# Patient Record
Sex: Female | Born: 1940 | Race: White | Hispanic: No | Marital: Married | Smoking: Never smoker
Health system: Southern US, Community
[De-identification: ages and names within clinical notes are randomized; demographics above are authoritative.]

## PROBLEM LIST (undated history)

## (undated) DIAGNOSIS — I639 Cerebral infarction, unspecified: Secondary | ICD-10-CM

## (undated) DIAGNOSIS — F32A Depression, unspecified: Secondary | ICD-10-CM

## (undated) DIAGNOSIS — F329 Major depressive disorder, single episode, unspecified: Secondary | ICD-10-CM

## (undated) DIAGNOSIS — E785 Hyperlipidemia, unspecified: Secondary | ICD-10-CM

## (undated) DIAGNOSIS — I10 Essential (primary) hypertension: Secondary | ICD-10-CM

## (undated) DIAGNOSIS — N189 Chronic kidney disease, unspecified: Secondary | ICD-10-CM

## (undated) DIAGNOSIS — C579 Malignant neoplasm of female genital organ, unspecified: Secondary | ICD-10-CM

## (undated) HISTORY — PX: ABDOMINAL HYSTERECTOMY: SHX81

## (undated) HISTORY — PX: EYE SURGERY: SHX253

## (undated) HISTORY — DX: Depression, unspecified: F32.A

## (undated) HISTORY — PX: KNEE SURGERY: SHX244

## (undated) HISTORY — PX: OTHER SURGICAL HISTORY: SHX169

## (undated) HISTORY — DX: Major depressive disorder, single episode, unspecified: F32.9

## (undated) HISTORY — PX: HEMORROIDECTOMY: SUR656

---

## 1999-08-12 ENCOUNTER — Other Ambulatory Visit: Admission: RE | Admit: 1999-08-12 | Discharge: 1999-08-12 | Payer: Self-pay | Admitting: Orthopedic Surgery

## 1999-10-10 ENCOUNTER — Encounter: Admission: RE | Admit: 1999-10-10 | Discharge: 1999-10-10 | Payer: Self-pay | Admitting: Family Medicine

## 1999-12-04 ENCOUNTER — Encounter (INDEPENDENT_AMBULATORY_CARE_PROVIDER_SITE_OTHER): Payer: Self-pay | Admitting: *Deleted

## 1999-12-04 ENCOUNTER — Ambulatory Visit (HOSPITAL_COMMUNITY): Admission: RE | Admit: 1999-12-04 | Discharge: 1999-12-04 | Payer: Self-pay | Admitting: Gastroenterology

## 1999-12-04 ENCOUNTER — Encounter (INDEPENDENT_AMBULATORY_CARE_PROVIDER_SITE_OTHER): Payer: Self-pay | Admitting: Specialist

## 2000-01-20 ENCOUNTER — Ambulatory Visit (HOSPITAL_COMMUNITY): Admission: RE | Admit: 2000-01-20 | Discharge: 2000-01-20 | Payer: Self-pay | Admitting: Gastroenterology

## 2000-03-23 ENCOUNTER — Encounter (INDEPENDENT_AMBULATORY_CARE_PROVIDER_SITE_OTHER): Payer: Self-pay | Admitting: *Deleted

## 2000-03-23 ENCOUNTER — Encounter: Payer: Self-pay | Admitting: Gastroenterology

## 2000-03-23 ENCOUNTER — Encounter: Admission: RE | Admit: 2000-03-23 | Discharge: 2000-03-23 | Payer: Self-pay | Admitting: Gastroenterology

## 2000-05-18 ENCOUNTER — Encounter: Payer: Self-pay | Admitting: Gastroenterology

## 2000-05-18 ENCOUNTER — Encounter (INDEPENDENT_AMBULATORY_CARE_PROVIDER_SITE_OTHER): Payer: Self-pay | Admitting: *Deleted

## 2000-05-18 ENCOUNTER — Encounter: Admission: RE | Admit: 2000-05-18 | Discharge: 2000-05-18 | Payer: Self-pay | Admitting: Gastroenterology

## 2000-06-19 ENCOUNTER — Encounter: Payer: Self-pay | Admitting: Emergency Medicine

## 2000-06-19 ENCOUNTER — Emergency Department (HOSPITAL_COMMUNITY): Admission: EM | Admit: 2000-06-19 | Discharge: 2000-06-19 | Payer: Self-pay | Admitting: Emergency Medicine

## 2002-12-13 ENCOUNTER — Ambulatory Visit (HOSPITAL_COMMUNITY)
Admission: RE | Admit: 2002-12-13 | Discharge: 2002-12-13 | Payer: Self-pay | Admitting: Physical Medicine and Rehabilitation

## 2003-04-25 ENCOUNTER — Encounter: Admission: RE | Admit: 2003-04-25 | Discharge: 2003-04-25 | Payer: Self-pay | Admitting: Family Medicine

## 2003-04-25 ENCOUNTER — Encounter: Payer: Self-pay | Admitting: Family Medicine

## 2003-05-09 ENCOUNTER — Encounter: Payer: Self-pay | Admitting: Family Medicine

## 2003-05-09 ENCOUNTER — Encounter: Admission: RE | Admit: 2003-05-09 | Discharge: 2003-05-09 | Payer: Self-pay | Admitting: Family Medicine

## 2005-04-20 ENCOUNTER — Encounter (INDEPENDENT_AMBULATORY_CARE_PROVIDER_SITE_OTHER): Payer: Self-pay | Admitting: *Deleted

## 2005-04-20 ENCOUNTER — Ambulatory Visit (HOSPITAL_COMMUNITY): Admission: RE | Admit: 2005-04-20 | Discharge: 2005-04-20 | Payer: Self-pay | Admitting: Gastroenterology

## 2006-07-09 ENCOUNTER — Encounter: Admission: RE | Admit: 2006-07-09 | Discharge: 2006-07-09 | Payer: Self-pay | Admitting: Rheumatology

## 2006-07-30 ENCOUNTER — Encounter: Admission: RE | Admit: 2006-07-30 | Discharge: 2006-07-30 | Payer: Self-pay | Admitting: Rheumatology

## 2006-08-25 ENCOUNTER — Encounter: Admission: RE | Admit: 2006-08-25 | Discharge: 2006-08-25 | Payer: Self-pay | Admitting: Rheumatology

## 2006-12-07 ENCOUNTER — Encounter: Admission: RE | Admit: 2006-12-07 | Discharge: 2006-12-07 | Payer: Self-pay | Admitting: Family Medicine

## 2007-04-29 ENCOUNTER — Encounter (INDEPENDENT_AMBULATORY_CARE_PROVIDER_SITE_OTHER): Payer: Self-pay | Admitting: *Deleted

## 2007-04-29 ENCOUNTER — Ambulatory Visit (HOSPITAL_COMMUNITY): Admission: RE | Admit: 2007-04-29 | Discharge: 2007-04-29 | Payer: Self-pay | Admitting: Family Medicine

## 2007-05-12 ENCOUNTER — Encounter: Admission: RE | Admit: 2007-05-12 | Discharge: 2007-05-12 | Payer: Self-pay | Admitting: Family Medicine

## 2007-05-16 ENCOUNTER — Encounter: Admission: RE | Admit: 2007-05-16 | Discharge: 2007-05-16 | Payer: Self-pay | Admitting: Family Medicine

## 2007-08-16 ENCOUNTER — Encounter (INDEPENDENT_AMBULATORY_CARE_PROVIDER_SITE_OTHER): Payer: Self-pay | Admitting: *Deleted

## 2007-12-20 ENCOUNTER — Encounter: Admission: RE | Admit: 2007-12-20 | Discharge: 2007-12-20 | Payer: Self-pay | Admitting: Family Medicine

## 2007-12-23 IMAGING — US US RENAL
1 series · 14 of 25 positions shown · non-contrast
Comparison: 04/29/07

CLINICAL DATA: Abnormal lesion of left kidney noted on a routine abdominal study, 04/29/07.  

 RENAL ULTRASOUND:
TECHNIQUE: Complete ultrasound examination of the urinary tract was performed including evaluation of the kidneys, renal collecting systems, and urinary bladder.

[Series 1: us renal · 0.28mm/px · 14 of 39 slices shown]
[im 1/39]
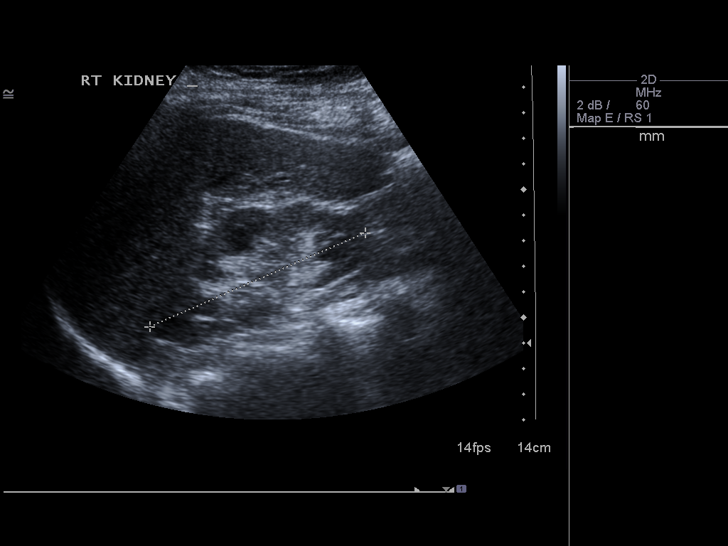
[im 4/39]
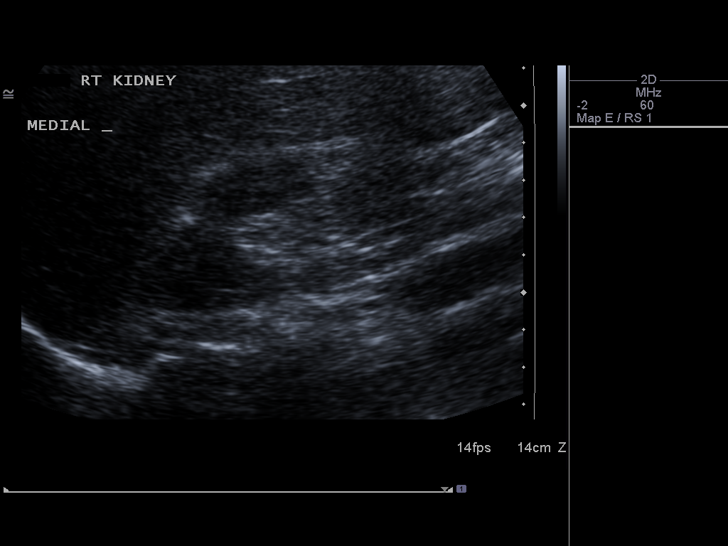
[im 7/39]
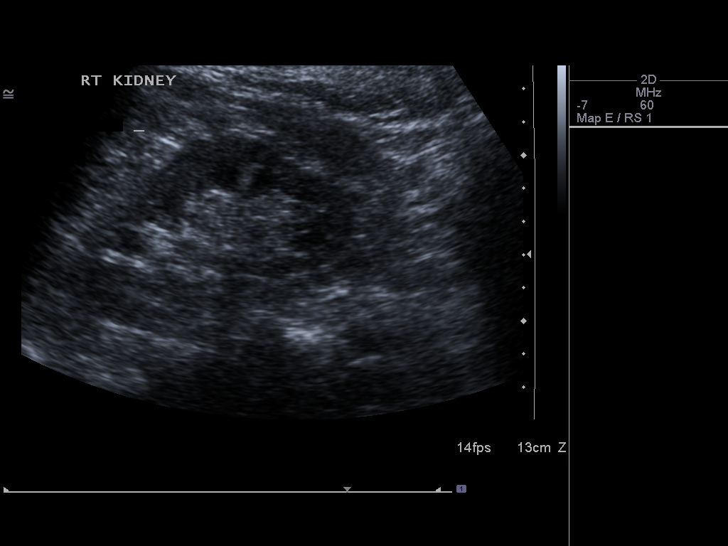
[im 10/39]
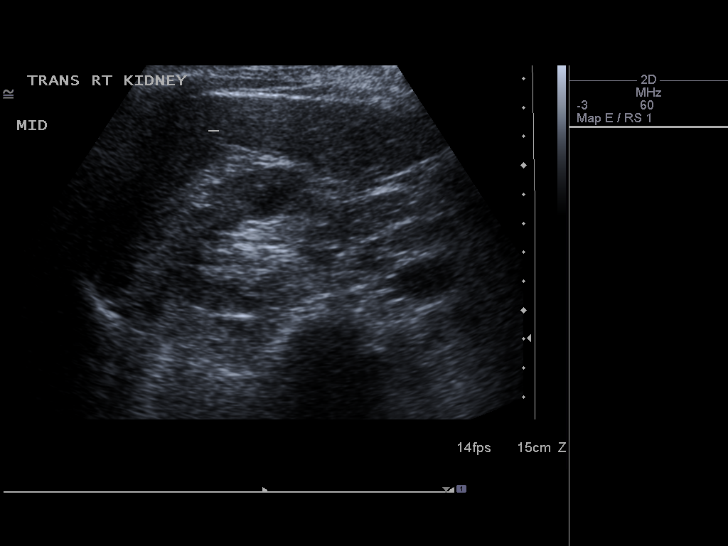
[im 13/39]
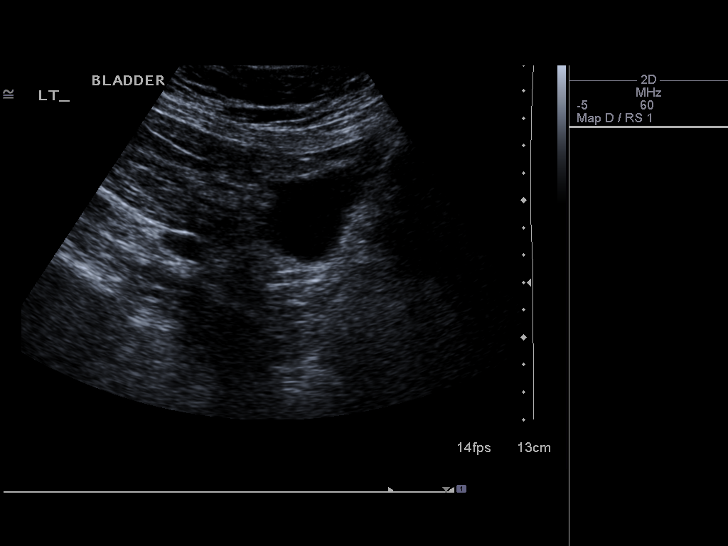
[im 15/39]
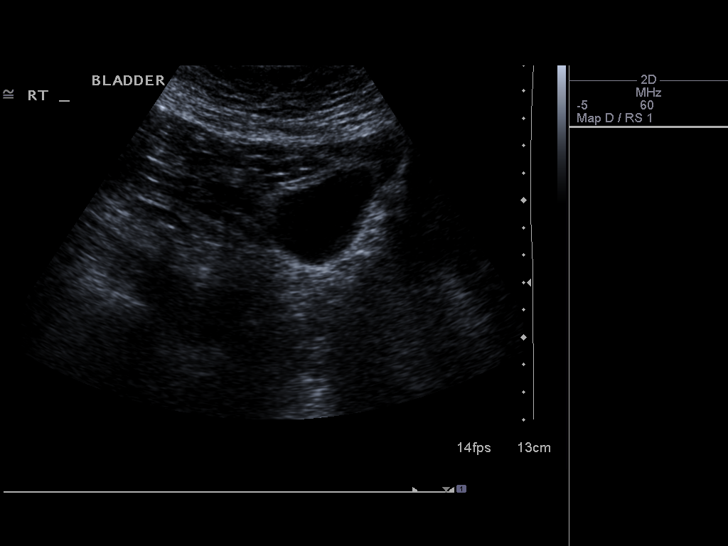
[im 18/39]
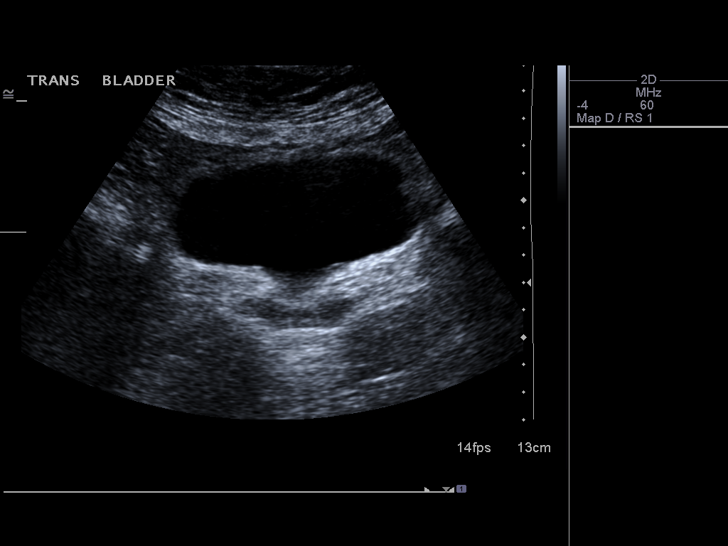
[im 21/39]
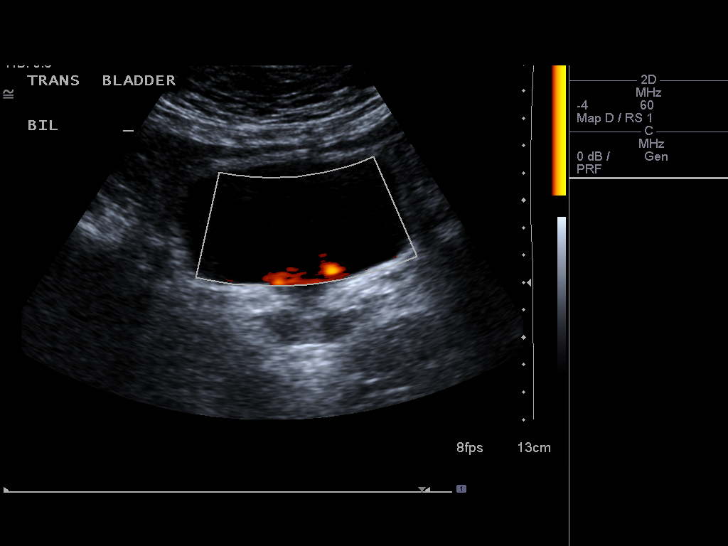
[im 24/39]
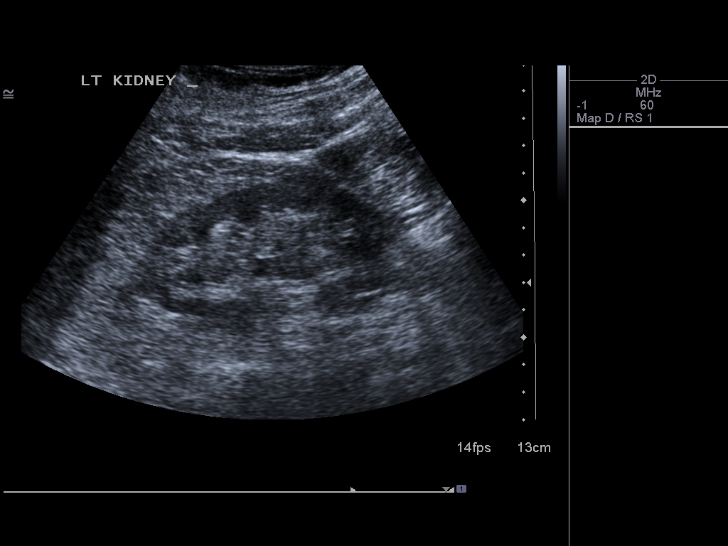
[im 26/39]
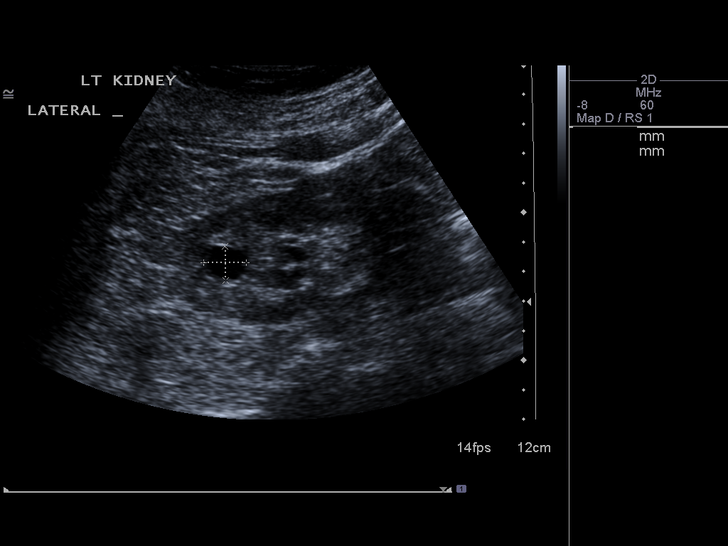
[im 29/39]
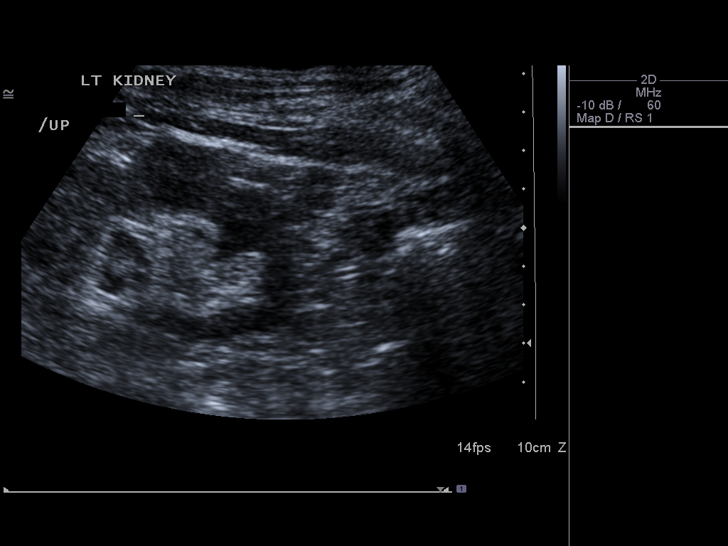
[im 32/39]
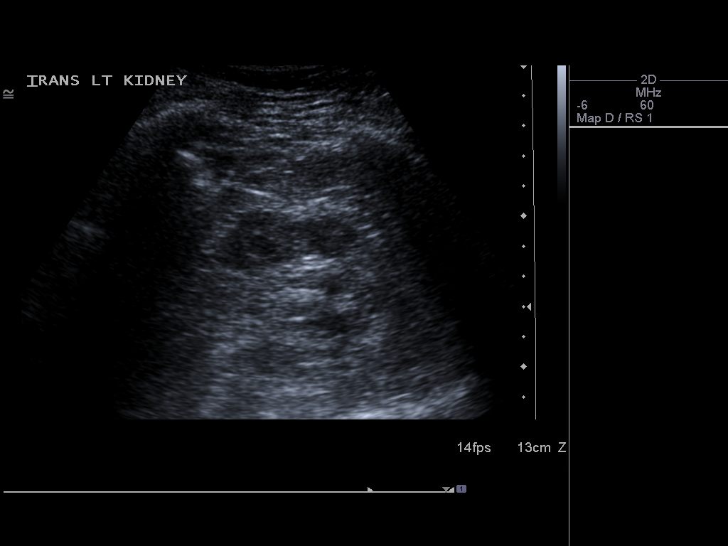
[im 35/39]
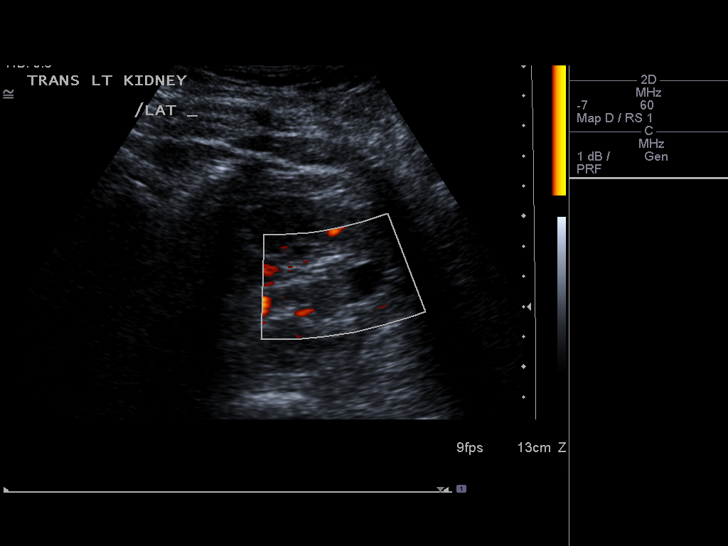
[im 39/39]
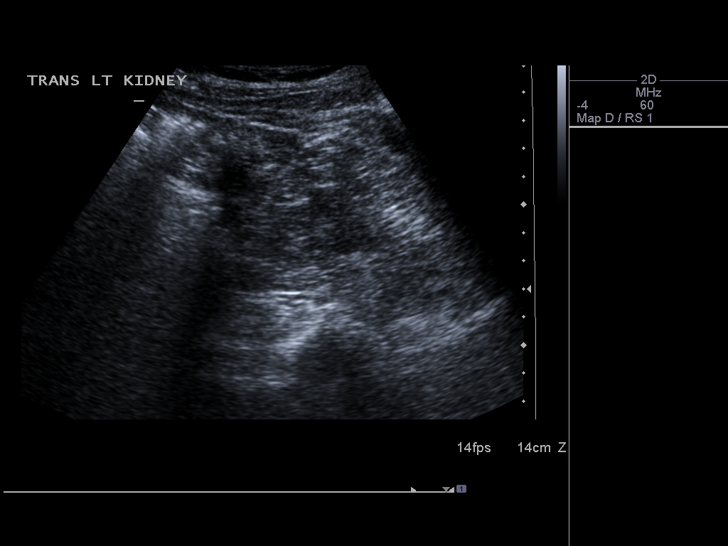

[14 of 25 positions shown; findings below may reference images not displayed]

FINDINGS: The right kidney is 9.2 and the left 9.6 cm in length.  The cystic lesion in the upper pole of the left kidney is again noted.  It measures 1.4 x 1.2 x 1.5 mm.  It looks like a simple cyst in that there are no internal echoes.  I cannot say, however, that there is definite through transmission.  I think it is highly likely that this is a simple cyst.  I believe it would be prudent to do a follow-up exam in six months, to make sure it is not enlarging.  No other renal lesions.  No hydronephrosis or obvious stones.  The bladder is normal.
IMPRESSION: The lesion in the upper pole of the left kidney is most likely a simple cyst as discussed above.  I would recommend a follow-up ultrasound in six months.  See report.

## 2008-12-11 ENCOUNTER — Emergency Department (HOSPITAL_COMMUNITY): Admission: EM | Admit: 2008-12-11 | Discharge: 2008-12-11 | Payer: Self-pay | Admitting: Emergency Medicine

## 2008-12-20 ENCOUNTER — Encounter: Admission: RE | Admit: 2008-12-20 | Discharge: 2008-12-20 | Payer: Self-pay | Admitting: Family Medicine

## 2009-03-06 ENCOUNTER — Encounter: Payer: Self-pay | Admitting: Cardiology

## 2009-03-06 ENCOUNTER — Ambulatory Visit: Payer: Self-pay | Admitting: Cardiology

## 2009-03-06 DIAGNOSIS — R002 Palpitations: Secondary | ICD-10-CM | POA: Insufficient documentation

## 2009-03-06 DIAGNOSIS — R079 Chest pain, unspecified: Secondary | ICD-10-CM | POA: Insufficient documentation

## 2009-03-06 DIAGNOSIS — R42 Dizziness and giddiness: Secondary | ICD-10-CM

## 2009-03-06 DIAGNOSIS — E785 Hyperlipidemia, unspecified: Secondary | ICD-10-CM

## 2009-03-14 ENCOUNTER — Ambulatory Visit: Payer: Self-pay

## 2009-03-14 ENCOUNTER — Encounter: Payer: Self-pay | Admitting: Cardiology

## 2009-03-15 ENCOUNTER — Ambulatory Visit: Payer: Self-pay | Admitting: Cardiology

## 2010-08-11 ENCOUNTER — Encounter (INDEPENDENT_AMBULATORY_CARE_PROVIDER_SITE_OTHER): Payer: Self-pay | Admitting: *Deleted

## 2010-09-03 ENCOUNTER — Encounter (INDEPENDENT_AMBULATORY_CARE_PROVIDER_SITE_OTHER): Payer: Self-pay | Admitting: *Deleted

## 2010-09-04 ENCOUNTER — Ambulatory Visit: Payer: Self-pay | Admitting: Internal Medicine

## 2010-09-22 ENCOUNTER — Encounter: Admission: RE | Admit: 2010-09-22 | Discharge: 2010-09-22 | Payer: Self-pay | Admitting: Diagnostic Neuroimaging

## 2010-09-25 ENCOUNTER — Ambulatory Visit: Payer: Self-pay | Admitting: Internal Medicine

## 2010-09-29 ENCOUNTER — Encounter: Payer: Self-pay | Admitting: Internal Medicine

## 2011-01-27 NOTE — Letter (Signed)
Summary: Previsit letter  [facility]  [address] [place], [place] [address]   Phone: [phone]  Fax: [phone]       08/11/2010 MRN: [id number]  [facility] [address] [place], [place]  [address]  Dear Ms. Rohlfs,  Welcome to the Gastroenterology Division at [facility]    You are scheduled to see a nurse for your pre-procedure visit on 09/04/2010 at 1:30AM on the 3rd floor at [facility], [address]  We ask that you try to arrive at our office 15 minutes prior to your appointment time to allow for check-in.  Your nurse visit will consist of discussing your medical and surgical history, your immediate family medical history, and your medications.    Please bring a complete list of all your medications or, if you prefer, bring the medication bottles and we will list them.  We will need to be aware of both prescribed and over the counter drugs.  We will need to know exact dosage information as well.  If you are on blood thinners (Coumadin, Plavix, Aggrenox, Ticlid, etc.) please call our office today/prior to your appointment, as we need to consult with your physician about holding your medication.   Please be prepared to read and sign documents such as consent forms, a financial agreement, and acknowledgement forms.  If necessary, and with your consent, a friend or relative is welcome to sit-in on the nurse visit with you.  Please bring your insurance card so that we may make a copy of it.  If your insurance requires a referral to see a specialist, please bring your referral form from your primary care physician.  No co-pay is required for this nurse visit.     If you cannot keep your appointment, please call [phone] to cancel or reschedule prior to your appointment date.  This allows [place] the opportunity to schedule an appointment for another patient in need of care.    Thank you for choosing  Gastroenterology for your medical needs.   We appreciate the opportunity to care for you.  Please visit [place] at our website  to learn more about our practice.                     Sincerely.                                                                                                                   The Gastroenterology Division

## 2011-01-27 NOTE — Procedures (Signed)
Summary: Colonoscopy  Patient: Nicole Ruiz Note: All result statuses are Final unless otherwise noted.  Tests: (1) Colonoscopy (COL)   COL Colonoscopy           DONE     [facility]     [address]     [place], [place]  [address]           COLONOSCOPY PROCEDURE REPORT           PATIENT:  Nicole Ruiz, Nicole Ruiz  MR#:  [id number]     BIRTHDATE:  1941/11/07, 69 yrs. old  GENDER:  female     ENDOSCOPIST:  Hedwig Morton. Juanda Chance, MD     REF. BY:  Charlesetta Shanks, M.D.     PROCEDURE DATE:  09/25/2010     PROCEDURE:  Colonoscopy [address]     ASA CLASS:  Class II     INDICATIONS:  Routine Risk Screening     MEDICATIONS:   Versed 6 mg, Fentanyl 50 mcg           DESCRIPTION OF PROCEDURE:   After the risks benefits and     alternatives of the procedure were thoroughly explained, informed     consent was obtained.  Digital rectal exam was performed and     revealed no rectal masses.   The LB PCF-[id number] endoscope     was introduced through the anus and advanced to the cecum, which     was identified by both the appendix and ileocecal valve, without     limitations.  The quality of the prep was adequate, using MiraLax.     The instrument was then slowly withdrawn as the colon was fully     examined.     <<PROCEDUREIMAGES>>           FINDINGS:  A sessile polyp was found. 5mm sessile polyp at 70 cm,     distal from the ileocecal valve Polyp was snared without cautery.     Retrieval was successful (see image2 and image1). snare polyp     This was otherwise a normal examination of the colon (see image3,     image2, and image1).  Internal hemorrhoids were found (see image9     and image8).  This was otherwise a normal examination of the colon     (see image6 and image5).   Retroflexed views in the rectum     revealed no abnormalities.    The scope was then withdrawn from     the patient and the procedure completed.           COMPLICATIONS:  None     ENDOSCOPIC IMPRESSION:     1) Sessile  polyp     2) Internal hemorrhoids     3) Otherwise normal examination     RECOMMENDATIONS:     1) Await pathology results     2) High fiber diet.     REPEAT EXAM:  In 5 - 7 year(s) for.           ______________________________     Hedwig Morton. Juanda Chance, MD           CC:           n.     eSIGNED:   Hedwig Morton. Brodie at 09/25/2010 11:17 AM           Gustavus Bryant, [id number]  Note: An exclamation mark (!) indicates a result that was not dispersed into the flowsheet. Document Creation Date: 09/25/2010  11:18 AM _______________________________________________________________________  (1) Order result status: Final Collection or observation date-time: 09/25/2010 11:04 Requested date-time:  Receipt date-time:  Reported date-time:  Referring Physician:   Ordering Physician: Lina Sar [phone]) Specimen Source:  Source: Launa Grill Order Number: [id number] Lab site:   Appended Document: Colonoscopy recall colon 10 years  Appended Document: Colonoscopy     Procedures Next Due Date:    Colonoscopy: 09/2020

## 2011-01-27 NOTE — Letter (Signed)
Summary: Patient Notice- Polyp Results  [facility]  [address] [place], [place] [address]   Phone: [phone]  Fax: [phone]        September 29, 2010 MRN: [id number]    Nicole Ruiz [address] [place], [place]  [address]    Dear [name],  I am pleased to inform you that the colon polyp(s) removed during your recent colonoscopy was (were) found to be benign (no cancer detected) upon pathologic examination.The polyp consisted of inflamed tissue ( not premalignant)  I recommend you have a repeat colonoscopy examination in 10 _ years to look for recurrent polyps, as having colon polyps increases your risk for having recurrent polyps or even colon cancer in the future.  Should you develop new or worsening symptoms of abdominal pain, bowel habit changes or bleeding from the rectum or bowels, please schedule an evaluation with either your primary care physician or with me.  Additional information/recommendations:  _x_ No further action with gastroenterology is needed at this time. Please      follow-up with your primary care physician for your other healthcare      needs.  __ Please call [phone] to schedule a return visit to review your      situation.  __ Please keep your follow-up visit as already scheduled.  __ Continue treatment plan as outlined the day of your exam.  Please call us if you are having persistent problems or have questions about your condition that have not been fully answered at this time.  Sincerely,  Hart Carwin MD  This letter has been electronically signed by your physician.  Appended Document: Patient Notice- Polyp Results letter mailed

## 2011-01-27 NOTE — Op Note (Signed)
Summary: COLON (Dr Magod)/BX  NAME:  Nicole, Ruiz               ACCOUNT NO.:  [id number]   MEDICAL RECORD NO.:  [id number]          PATIENT TYPE:  AMB   LOCATION:  ENDO                         FACILITY:  [facility]   PHYSICIAN:  Petra Kuba, M.D.    DATE OF BIRTH:  Jan 24, 1941   DATE OF PROCEDURE:  04/20/2005  DATE OF DISCHARGE:                                 OPERATIVE REPORT   PROCEDURE PERFORMED:  Colonoscopy with biopsy.   ENDOSCOPIST:  Petra Kuba, M.D.   INDICATIONS FOR PROCEDURE:  Patient with a history of colon polyps due for  repeat screening.  Consent was signed after the risks, benefits, methods and  options were thoroughly discussed in the office in the past.   MEDICINES USED:  Demerol 70 mg, Versed 7.5 mg.   DESCRIPTION OF PROCEDURE:  Rectal inspection was pertinent for external  hemorrhoids, small.  Digital exam was negative.  A video pediatric  adjustable colonoscope was inserted and fairly easily advanced around the  colon to the cecum.  This did require some abdominal pressure but no  position changes.  No obvious abnormality was seen on insertion.  The cecum  was identified by the appendiceal orifice and the ileocecal valve.  In fact  the scope was inserted a short ways into the terminal ileum which was  normal.  Photodocumentation was obtained.  The scope was slowly withdrawn.  The prep was adequate.  It did require a moderate amount of washing and  suctioning for adequate visualization.  On slow withdrawal through the  colon, cecum, ascending, transverse and majority of the descending were  normal.  As the scope was withdrawn around the sigmoid, rare early left-  sided diverticula were seen.  In the midsigmoid, questionable tiny polyp was  seen and was cold biopsied times one.  Scope was further withdrawn.  No  additional findings were seen as we slowly withdrew back to the rectum.  Anorectal pullthrough and retroflexion confirmed some small hemorrhoids.  The  scope was straightened, and advanced up the left side of the colon.  Air  was suctioned, scope removed.  The patient tolerated the procedure well.  There was no immediate obvious complication.   ENDOSCOPIC DIAGNOSIS:  1.  Internal and external hemorrhoids.  2.  Early left-sided diverticula, few.  3.  Questionable tiny midsigmoid polyp cold biopsied.  4.  Otherwise within normal limits to the terminal ileum.   PLAN:  Await pathology to determine future colonic screening.  Probably  recheck in five years.  Happy to see back p.r.n.  Otherwise return care to  Dr. Modesto Charon for the customary health care maintenance to include yearly rectals  and guaiacs.      MEM/MEDQ  D:  04/20/2005  T:  04/20/2005  Job:  [id number]   cc:   Maryla Morrow. Modesto Charon, M.D.  [address]  [place]  [place] [address]  Fax: [phone]   FINAL DIAGNOSIS    ***MICROSCOPIC EXAMINATION AND DIAGNOSIS***    SIGMOID COLON, BIOPSY: FRAGMENT OF BENIGN COLONIC MUCOSA.    COMMENT   There is colorectal  mucosa with normal crypt architecture and no   objective increase in inflammation. No active inflammation,   microscopic colitis, collagenous colitis or significant chronic   changes identified. No hyperplastic or adenomatous changes are   seen, and there is no evidence of malignancy. (MS:jy) 04/21/05    jy   Date Reported: 04/21/2005 Nicole Ruiz, M.D.   *** Electronically Signed Out By MS ***    Clinical information   R/O adenoma; ? polyp (cm)    specimen(s) obtained   Colon, biopsy, sigmoid    Gross Description   Received in formalin is a tan, soft tissue fragment that is   submitted in toto. Size: 0.2 cm. (GP:jy) 04/20/05    jy/

## 2011-01-27 NOTE — Miscellaneous (Signed)
Summary: [facility]  Clinical Lists Changes  Medications: Added new medication of MIRALAX   POWD (POLYETHYLENE GLYCOL 3350) As per prep  instructions. - Signed Added new medication of DULCOLAX 5 MG  TBEC (BISACODYL) Day before procedure take 2 at 3pm and 2 at 8pm. - Signed Added new medication of REGLAN 10 MG  TABS (METOCLOPRAMIDE HCL) As per prep instructions. - Signed Rx of MIRALAX   POWD (POLYETHYLENE GLYCOL 3350) As per prep  instructions.;  #255gm x 0;  Signed;  Entered by: Ezra Sites RN;  Authorized by: Hart Carwin MD;  Method used: Electronically to [facility] [address] [place], [place], [place]  [address], Ph: [phone], Fax: [phone] Rx of DULCOLAX 5 MG  TBEC (BISACODYL) Day before procedure take 2 at 3pm and 2 at 8pm.;  #4 x 0;  Signed;  Entered by: Ezra Sites RN;  Authorized by: Hart Carwin MD;  Method used: Electronically to [facility] [address] [place], [place], [place]  [address], Ph: [phone], Fax: [phone] Rx of REGLAN 10 MG  TABS (METOCLOPRAMIDE HCL) As per prep instructions.;  #2 x 0;  Signed;  Entered by: Ezra Sites RN;  Authorized by: Hart Carwin MD;  Method used: Electronically to [facility] [address], [place], [place], [place]  [address], Ph: [phone], Fax: [phone] Observations: Added new observation of NKA: T (09/04/2010 13:08)    Prescriptions: REGLAN 10 MG  TABS (METOCLOPRAMIDE HCL) As per prep instructions.  #2 x 0   Entered by:   Ezra Sites RN   Authorized by:   Hart Carwin MD   Signed by:   Ezra Sites RN on 09/04/2010   Method used:   Electronically to        [facility] (retail)       [address]       [place], [place]  [address]       Ph: [phone]       Fax: [phone]   RxID:   [id number] DULCOLAX 5 MG  TBEC (BISACODYL) Day before procedure take 2 at 3pm and 2 at 8pm.  #4 x 0   Entered by:   Ezra Sites RN   Authorized  by:   Hart Carwin MD   Signed by:   Ezra Sites RN on 09/04/2010   Method used:   Electronically to        [facility] (retail)       [address]       [place], [place]  [address]       Ph: [phone]       Fax: [phone]   RxID:   [id number] MIRALAX   POWD (POLYETHYLENE GLYCOL 3350) As per prep  instructions.  #255gm x 0   Entered by:   Ezra Sites RN   Authorized by:   Hart Carwin MD   Signed by:   Ezra Sites RN on 09/04/2010   Method used:   Electronically to        [facility] (retail)       [address]       [place], [place]  [address]       Ph: [phone]       Fax: [phone]  RxID:   [id number]

## 2011-01-27 NOTE — Letter (Signed)
Summary: [facility] Instructions  [facility]  [address] [place], [place] [address]   Phone: [phone]  Fax: [phone]       CLOIE WOODEN    [birth year]    MRN: [id number]       Procedure Day /Date:  Thursday 09/25/2010     Arrival Time: 10:00 am     Procedure Time:  11:00 am     Location of Procedure:                    _x _  [facility] (4th Floor)    PREPARATION FOR COLONOSCOPY WITH MIRALAX  Starting 5 days prior to your procedure Saturday 9/24 do not eat nuts, seeds, popcorn, corn, beans, peas,  salads, or any raw vegetables.  Do not take any fiber supplements (e.g. Metamucil, Citrucel, and Benefiber). ____________________________________________________________________________________________________   THE DAY BEFORE YOUR PROCEDURE         DATE: Wednesday 9/28  1   Drink clear liquids the entire day-NO SOLID FOOD  2   Do not drink anything colored red or purple.  Avoid juices with pulp.  No orange juice.  3   Drink at least 64 oz. (8 glasses) of fluid/clear liquids during the day to prevent dehydration and help the prep work efficiently.  CLEAR LIQUIDS INCLUDE: Water Jello Ice Popsicles Tea (sugar ok, no milk/cream) Powdered fruit flavored drinks Coffee (sugar ok, no milk/cream) Gatorade Juice: apple, white grape, white cranberry  Lemonade Clear bullion, consomm, broth Carbonated beverages (any kind) Strained chicken noodle soup Hard Candy  4   Mix the entire bottle of Miralax with 64 oz. of Gatorade/Powerade in the morning and put in the refrigerator to chill.  5   At 3:00 pm take 2 Dulcolax/Bisacodyl tablets.  6   At 4:30 pm take one Reglan/Metoclopramide tablet.  7  Starting at 5:00 pm drink one 8 oz glass of the Miralax mixture every 15-20 minutes until you have finished drinking the entire 64 oz.  You should finish drinking prep around 7:30 or 8:00 pm.  8   If you are nauseated, you may take the 2nd  Reglan/Metoclopramide tablet at 6:30 pm.        9    At 8:00 pm take 2 more DULCOLAX/Bisacodyl tablets.     THE DAY OF YOUR PROCEDURE      DATE:  Thursday 9/29  You may drink clear liquids until 9:00 am  (2 HOURS BEFORE PROCEDURE).   MEDICATION INSTRUCTIONS  Unless otherwise instructed, you should take regular prescription medications with a small sip of water as early as possible the morning of your procedure.           OTHER INSTRUCTIONS  You will need a responsible adult at least 70 years of age to accompany you and drive you home.   This person must remain in the waiting room during your procedure.  Wear loose fitting clothing that is easily removed.  Leave jewelry and other valuables at home.  However, you may wish to bring a book to read or an iPod/MP3 player to listen to music as you wait for your procedure to start.  Remove all body piercing jewelry and leave at home.  Total time from sign-in until discharge is approximately 2-3 hours.  You should go home directly after your procedure and rest.  You can resume normal activities the day after your procedure.  The day of your procedure you should not:   Drive  Make legal decisions   Operate machinery   Drink alcohol   Return to work  You will receive specific instructions about eating, activities and medications before you leave.   The above instructions have been reviewed and explained to me by   Ezra Sites RN  September 04, 2010 1:49 PM     I fully understand and can verbalize these instructions _____________________________ Date _______

## 2011-05-15 NOTE — Op Note (Signed)
Nicole Ruiz, Nicole Ruiz               ACCOUNT NO.:  [id number]   MEDICAL RECORD NO.:  [id number]          PATIENT TYPE:  AMB   LOCATION:  ENDO                         FACILITY:  [facility]   PHYSICIAN:  Petra Kuba, M.D.    DATE OF BIRTH:  12/02/1941   DATE OF PROCEDURE:  04/20/2005  DATE OF DISCHARGE:                                 OPERATIVE REPORT   PROCEDURE PERFORMED:  Colonoscopy with biopsy.   ENDOSCOPIST:  Petra Kuba, M.D.   INDICATIONS FOR PROCEDURE:  Patient with a history of colon polyps due for  repeat screening.  Consent was signed after the risks, benefits, methods and  options were thoroughly discussed in the office in the past.   MEDICINES USED:  Demerol 70 mg, Versed 7.5 mg.   DESCRIPTION OF PROCEDURE:  Rectal inspection was pertinent for external  hemorrhoids, small.  Digital exam was negative.  A video pediatric  adjustable colonoscope was inserted and fairly easily advanced around the  colon to the cecum.  This did require some abdominal pressure but no  position changes.  No obvious abnormality was seen on insertion.  The cecum  was identified by the appendiceal orifice and the ileocecal valve.  In fact  the scope was inserted a short ways into the terminal ileum which was  normal.  Photodocumentation was obtained.  The scope was slowly withdrawn.  The prep was adequate.  It did require a moderate amount of washing and  suctioning for adequate visualization.  On slow withdrawal through the  colon, cecum, ascending, transverse and majority of the descending were  normal.  As the scope was withdrawn around the sigmoid, rare early left-  sided diverticula were seen.  In the midsigmoid, questionable tiny polyp was  seen and was cold biopsied times one.  Scope was further withdrawn.  No  additional findings were seen as we slowly withdrew back to the rectum.  Anorectal pullthrough and retroflexion confirmed some small hemorrhoids.  The scope was straightened, and  advanced up the left side of the colon.  Air  was suctioned, scope removed.  The patient tolerated the procedure well.  There was no immediate obvious complication.   ENDOSCOPIC DIAGNOSIS:  1.  Internal and external hemorrhoids.  2.  Early left-sided diverticula, few.  3.  Questionable tiny midsigmoid polyp cold biopsied.  4.  Otherwise within normal limits to the terminal ileum.   PLAN:  Await pathology to determine future colonic screening.  Probably  recheck in five years.  Happy to see back p.r.n.  Otherwise return care to  Dr. Modesto Charon for the customary health care maintenance to include yearly rectals  and guaiacs.      MEM/MEDQ  D:  04/20/2005  T:  04/20/2005  Job:  [id number]   cc:   Maryla Morrow. Modesto Charon, M.D.  [address]  [place]  [place] [address]  Fax: [phone]

## 2011-10-01 LAB — DIFFERENTIAL
Basophils Absolute: 0 10*3/uL (ref 0.0–0.1)
Basophils Relative: 0 % (ref 0–1)
Eosinophils Absolute: 0.1 10*3/uL (ref 0.0–0.7)
Lymphocytes Relative: 32 % (ref 12–46)
Lymphs Abs: 1.4 10*3/uL (ref 0.7–4.0)
Monocytes Relative: 9 % (ref 3–12)
Neutro Abs: 2.6 10*3/uL (ref 1.7–7.7)
Neutrophils Relative %: 58 % (ref 43–77)

## 2011-10-01 LAB — COMPREHENSIVE METABOLIC PANEL
ALT: 11 U/L (ref 0–35)
AST: 14 U/L (ref 0–37)
Albumin: 3.6 g/dL (ref 3.5–5.2)
BUN: 14 mg/dL (ref 6–23)
CO2: 26 mEq/L (ref 19–32)
Calcium: 9.2 mg/dL (ref 8.4–10.5)
Chloride: 108 mEq/L (ref 96–112)
GFR calc Af Amer: >60 mL/min (ref >60)
Glucose, Bld: 98 mg/dL (ref 70–99)
Sodium: 140 mEq/L (ref 135–145)

## 2011-10-01 LAB — CBC
MCHC: 33.7 g/dL (ref 30.0–36.0)
RDW: 13.3 % (ref 11.5–15.5)
WBC: 4.4 10*3/uL (ref 4.0–10.5)

## 2011-10-01 LAB — URINALYSIS, ROUTINE W REFLEX MICROSCOPIC
Bilirubin Urine: NEGATIVE
Nitrite: NEGATIVE
Specific Gravity, Urine: 1.015 (ref 1.005–1.030)
Urobilinogen, UA: 0.2 mg/dL (ref 0.0–1.0)

## 2011-10-01 LAB — CK TOTAL AND CKMB (NOT AT ARMC)
CK, MB: 1 ng/mL (ref 0.3–4.0)
Total CK: 50 U/L (ref 7–177)

## 2011-12-18 DIAGNOSIS — S92309A Fracture of unspecified metatarsal bone(s), unspecified foot, initial encounter for closed fracture: Secondary | ICD-10-CM | POA: Insufficient documentation

## 2011-12-29 HISTORY — PX: CARDIAC CATHETERIZATION: SHX172

## 2012-05-28 ENCOUNTER — Emergency Department (HOSPITAL_COMMUNITY): Payer: Medicare PPO

## 2012-05-28 ENCOUNTER — Observation Stay (HOSPITAL_COMMUNITY)
Admission: EM | Admit: 2012-05-28 | Discharge: 2012-05-29 | Disposition: A | Discharge status: Home / Self Care | Payer: Medicare PPO | Source: Ambulatory Visit | Attending: Cardiology | Admitting: Cardiology

## 2012-05-28 ENCOUNTER — Encounter (HOSPITAL_COMMUNITY): Payer: Self-pay | Admitting: Family Medicine

## 2012-05-28 DIAGNOSIS — R42 Dizziness and giddiness: Secondary | ICD-10-CM | POA: Insufficient documentation

## 2012-05-28 DIAGNOSIS — I10 Essential (primary) hypertension: Secondary | ICD-10-CM | POA: Insufficient documentation

## 2012-05-28 DIAGNOSIS — E785 Hyperlipidemia, unspecified: Secondary | ICD-10-CM | POA: Insufficient documentation

## 2012-05-28 DIAGNOSIS — R079 Chest pain, unspecified: Secondary | ICD-10-CM

## 2012-05-28 DIAGNOSIS — R110 Nausea: Secondary | ICD-10-CM | POA: Insufficient documentation

## 2012-05-28 HISTORY — DX: Hyperlipidemia, unspecified: E78.5

## 2012-05-28 HISTORY — DX: Essential (primary) hypertension: I10

## 2012-05-28 HISTORY — DX: Malignant neoplasm of female genital organ, unspecified: C57.9

## 2012-05-28 LAB — CBC
Hemoglobin: 12.2 g/dL (ref 12.0–15.0)
MCH: 31.4 pg (ref 26.0–34.0)
MCHC: 34.4 g/dL (ref 30.0–36.0)
MCV: 91.5 fL (ref 78.0–100.0)
Platelets: 272 10*3/uL (ref 150–400)
RBC: 3.88 MIL/uL (ref 3.87–5.11)

## 2012-05-28 LAB — COMPREHENSIVE METABOLIC PANEL
BUN: 16 mg/dL (ref 6–23)
Calcium: 9.4 mg/dL (ref 8.4–10.5)
GFR calc Af Amer: 62 mL/min — ABNORMAL LOW (ref >90)
Glucose, Bld: 107 mg/dL — ABNORMAL HIGH (ref 70–99)
Total Protein: 6.5 g/dL (ref 6.0–8.3)

## 2012-05-28 LAB — TROPONIN I: Troponin I: <0.3 ng/mL (ref <0.30)

## 2012-05-28 LAB — DIFFERENTIAL
Eosinophils Absolute: 0.2 10*3/uL (ref 0.0–0.7)
Eosinophils Relative: 4 % (ref 0–5)
Lymphs Abs: 1.2 10*3/uL (ref 0.7–4.0)
Monocytes Relative: 8 % (ref 3–12)

## 2012-05-28 LAB — LIPASE, BLOOD: Lipase: 30 U/L (ref 11–59)

## 2012-05-28 MED ORDER — METHOCARBAMOL 500 MG PO TABS
750.0000 mg | ORAL_TABLET | Freq: Two times a day (BID) | ORAL | Status: DC
  Administered 2012-05-29 (×2): 750 mg via ORAL
  Filled 2012-05-28 (×3): qty 1.5

## 2012-05-28 MED ORDER — PROPRANOLOL HCL 10 MG PO TABS
10.0000 mg | ORAL_TABLET | Freq: Two times a day (BID) | ORAL | Status: DC
  Administered 2012-05-29 (×2): 10 mg via ORAL
  Filled 2012-05-28 (×3): qty 1

## 2012-05-28 MED ORDER — NABUMETONE 500 MG PO TABS
1000.0000 mg | ORAL_TABLET | Freq: Two times a day (BID) | ORAL | Status: DC
  Administered 2012-05-29 (×2): 1000 mg via ORAL
  Filled 2012-05-28 (×3): qty 2

## 2012-05-28 MED ORDER — ESTRADIOL 0.1 MG/24HR TD PTWK
0.1000 mg | MEDICATED_PATCH | TRANSDERMAL | Status: DC
  Administered 2012-05-29: 0.1 mg via TRANSDERMAL
  Filled 2012-05-28 (×3): qty 1

## 2012-05-28 MED ORDER — CITALOPRAM HYDROBROMIDE 20 MG PO TABS
20.0000 mg | ORAL_TABLET | Freq: Every day | ORAL | Status: DC
  Administered 2012-05-28 – 2012-05-29 (×2): 20 mg via ORAL
  Filled 2012-05-28 (×3): qty 1

## 2012-05-28 MED ORDER — SIMVASTATIN 40 MG PO TABS
40.0000 mg | ORAL_TABLET | Freq: Every day | ORAL | Status: DC
Start: 2012-05-29 — End: 2012-05-29
  Filled 2012-05-28 (×2): qty 1

## 2012-05-28 MED ORDER — NORTRIPTYLINE HCL 25 MG PO CAPS
25.0000 mg | ORAL_CAPSULE | Freq: Every day | ORAL | Status: DC
  Filled 2012-05-28 (×2): qty 1

## 2012-05-28 NOTE — ED Notes (Signed)
ATTEMPTED TO GIVE REPORT AGAIN TO 2000 FLOOR NURSE , STILL NOT READY AT THIS TIME.

## 2012-05-28 NOTE — ED Notes (Signed)
PHARMACY NOTIFIED ON PT.'S PENDING MEDICATIONS.

## 2012-05-28 NOTE — ED Provider Notes (Signed)
History     CSN: [id number]  Arrival date & time 05/28/12  1754   First MD Initiated Contact with Patient 05/28/12 1810      Chief Complaint  Patient presents with  . Chest Pain    (Consider location/radiation/quality/duration/timing/severity/associated sxs/prior treatment) HPI Comments: Was getting ready to go to an auction this morning, then started with tightness in the left anterior chest.  She felt short of breath, dizzy and laid down to rest.  Shortly after arriving at the auction she began to feel more dizzy and her husband and friends called 911.  She was given 324 asa en route.    Patient is a 71 y.o. female presenting with chest pain. The history is provided by the patient.  Chest Pain Episode onset: this morning at about 11 AM. Chest pain occurs constantly. The chest pain is improving. The severity of the pain is moderate. The quality of the pain is described as tightness. The pain radiates to the upper back. Chest pain is worsened by certain positions and deep breathing. Primary symptoms include shortness of breath and nausea. Pertinent negatives for primary symptoms include no fever and no cough. She tried nothing for the symptoms. Risk factors: family history, cholesterol.  Her past medical history is significant for hyperlipidemia and hypertension.  Her family medical history is significant for CAD in family.     Past Medical History  Diagnosis Date  . Hypertension   . Hyperlipidemia     History reviewed. No pertinent past surgical history.  No family history on file.  History  Substance Use Topics  . Smoking status: Never Smoker   . Smokeless tobacco: Not on file  . Alcohol Use: No    OB History    Grav Para Term Preterm Abortions TAB SAB Ect Mult Living                  Review of Systems  Constitutional: Negative for fever.  Respiratory: Positive for shortness of breath. Negative for cough.   Cardiovascular: Positive for chest pain.    Gastrointestinal: Positive for nausea.  All other systems reviewed and are negative.    Allergies  Review of patient's allergies indicates no known allergies.  Home Medications   Current Outpatient Rx  Name Route Sig Dispense Refill  . CITALOPRAM HYDROBROMIDE 20 MG PO TABS Oral Take 20 mg by mouth daily.    [name] ESTRADIOL 0.1 MG/24HR TD PTWK Transdermal Place 1 patch onto the skin once a week. Places on Saturdays.    [name] METHOCARBAMOL 750 MG PO TABS Oral Take 750 mg by mouth 2 (two) times daily.    [name] NABUMETONE 500 MG PO TABS Oral Take 1,000 mg by mouth 2 (two) times daily.     [name] NORTRIPTYLINE HCL 25 MG PO CAPS Oral Take 25 mg by mouth at bedtime.    [name] PROPRANOLOL HCL 10 MG PO TABS Oral Take 10 mg by mouth 2 (two) times daily.    [name] SIMVASTATIN 40 MG PO TABS Oral Take 40 mg by mouth at bedtime.      BP 126/75  Pulse 64  Temp(Src) 98.5 F (36.9 C) (Oral)  Resp 16  SpO2 100%  Physical Exam  Nursing note and vitals reviewed. Constitutional: She is oriented to person, place, and time. She appears well-developed and well-nourished. No distress.  HENT:  Head: Normocephalic and atraumatic.  Neck: Normal range of motion. Neck supple.  Cardiovascular: Normal rate and regular rhythm.  Exam reveals no gallop  and no friction rub.   No murmur heard. Pulmonary/Chest: Effort normal and breath sounds normal. No respiratory distress. She has no wheezes.  Abdominal: Soft. Bowel sounds are normal. She exhibits no distension. There is no tenderness.  Musculoskeletal: Normal range of motion.  Neurological: She is alert and oriented to person, place, and time.  Skin: Skin is warm and dry. She is not diaphoretic.    ED Course  Procedures (including critical care time)   Labs Reviewed  CBC  DIFFERENTIAL  COMPREHENSIVE METABOLIC PANEL  TROPONIN I  LIPASE, BLOOD   No results found.   No diagnosis found.   Date: 05/28/2012  Rate: 63  Rhythm: normal sinus rhythm  QRS Axis: normal   Intervals: normal  ST/T Wave abnormalities: normal  Conduction Disutrbances:none  Narrative Interpretation:   Old EKG Reviewed: unchanged    MDM  I have spoken with cardiology who will admit the patient for rule out of mi.  She has no cardiac history, but her story and risk factors are concerning.  Tonight's workup is unremarkable.        Geoffery Lyons, MD 05/28/12 2312

## 2012-05-28 NOTE — ED Notes (Signed)
TRANSPORTED TO FLOOR IN STABLE CONDITION , NO CHEST PAIN / DENIES SOB . IV SITE INTACT.

## 2012-05-28 NOTE — ED Notes (Signed)
CALLED FLOOR 2000 FLOOR TO GIVE REPORT, 2000 FLOOR CHARGE NURSE ADVISED ER NURSE THAT THEY CAN NOT RECEIVE REPORT AT THIS TIME .

## 2012-05-28 NOTE — H&P (Signed)
NAMEMAILY, DEBARGE NO.:  [id number]  MEDICAL RECORD NO.:  [id number]  LOCATION:  [id number]                        FACILITY:  [facility]  PHYSICIAN:  Natasha Bence, MD       DATE OF BIRTH:  08/07/1941  DATE OF ADMISSION:  05/28/2012 DATE OF DISCHARGE:                             HISTORY & PHYSICAL   CHIEF CARDIOLOGIST:  [name], MD, [facility]  CHIEF COMPLAINT:  Chest pain.  HISTORY OF PRESENT ILLNESS:  Nicole Ruiz is a 71 year old white female with no known coronary artery disease who presented to the emergency department tonight after 2 episodes of chest discomfort today.  The first one began around 10:30 this morning.  She reported a severe sharp stabbing chest discomfort in her left chest.  It radiated into her neck, into her left arm, it was associated with shortness of breath and diaphoresis and mild lightheadedness.  She reports that this pain lasted approximately 2-3 hours and was relieved with resting in a still position.  She reports it was exacerbated with movement.  She finally became comfortable and then approximately 4:30 this afternoon was at an auction in standing up and developed recurrent chest discomfort, shortness of breath and then became diaphoretic and had some lightheadedness.  She denies any palpitations or frank syncope.  En route to the emergency department, she was given 2 nitroglycerin and she is currently pain free.  She reports as long as she lays still, she does not have any pain, this pain is not associated with inspiration.  She has not had any recent chest discomfort with exertion.  However, she does have dyspnea on exertion over the last year, it seems to be worse. She also has noticed some increased episodes of diaphoresis throughout the day intermittently.  She has no recent weight loss, but does have a 10-pound weight gain over the last year.  Otherwise denies any fevers, chills.  She has not had any bleeding or melena.  No  rashes or ulcers. She denies any PND or orthopnea.  Rest of a complete 12-point review of systems was performed and was otherwise negative.  She denies any recent depression or anxiety.  No hyper or  hypothyroid symptoms otherwise.  PAST MEDICAL HISTORY: 1. Hypertension. 2. Dyslipidemia. 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  PAST SURGICAL HISTORY:  She has had knee surgery, hysterectomy, hemorrhoidectomy, and arm surgery.  FAMILY HISTORY:  Mother with heart disease in her 1s.  Her brother had heart disease in his 73s.  SOCIAL HISTORY:  She is married.  She is a nonsmoker.  Does not drink any alcohol or use illicit drugs.  ALLERGIES:  She has no known drug allergies.  HOME MEDICATIONS:  She is on; 1. Celexa 20 mg p.o. daily. 2. Climara patch once weekly. 3. Robaxin 750 mg p.o. b.i.d. 4. Relafen 1000 mg p.o. b.i.d. 5. Pamelor 25 mg p.o. at bedtime. 6. Inderal 10 mg p.o. b.i.d. 7. Zocor 40 mg p.o. at bedtime.  PHYSICAL EXAMINATION:  VITAL SIGNS:  She is afebrile with temperature 98.5, pulse is 63 and regular, respiratory rate of 14, blood pressure 111/91, O2 sat is 98% on room air. GENERAL:  She  is an overweight white female, in no apparent distress. Eyes; she has anicteric sclerae. NECK:  She has normal jugular venous pressure.  No carotid bruits.  No thyromegaly. HEENT:  Mucous membranes are moist. LUNGS:  Clear to auscultation bilaterally. CARDIOVASCULAR:  She has a regular rate and rhythm.  No murmurs, rubs, or gallops. ABDOMEN:  Soft, nontender, nondistended.  No masses.  No bruits. EXTREMITIES:  Warm with no edema.  She has symmetrical pulses. NEUROLOGIC:  Grossly afocal.  She is awake, alert, and oriented. PSYCHIATRY:  Normal affect. SKIN:  No rashes or ulcers.  LABORATORY DATA:  Sodium 137, potassium of 4.8, chloride of 105, bicarb of 24, BUN of 16 creatinine of 1.03, calcium of 9.4, glucose of 107. Troponin is less than 0.3.  White blood cell count of 4.1 with  normal differential, hematocrit of 36, platelet count of 272.  Chest x-ray shows no acute infiltrates or effusion.  No widening of her mediastinum as interpreted by me.  EKG interpreted by me shows sinus mechanism, rate of 63 beats per minute.  No acute ST changes to suggest ischemia.  IMPRESSION AND PLAN:  This is a 71 year old white female with hypertension, dyslipidemia, and a family history of coronary artery disease who presents with chest discomfort.  She has both features that are typical and atypical of angina.  Given her risk factor profile and her history, feel it is warranted to observe her overnight with serial cardiac enzymes on telemetry.  She is currently pain free.  If this remains negative, we will probably proceed with noninvasive evaluation with stress testing.  Of note, she reportedly had a stress test, which was not in the system, which was an exercise treadmill test which she reports is normal approximately 3 years ago.  Otherwise, her pain does seem fairly atypical in the sense that it seems to be exacerbated with movement.  However, some of the additional symptoms she describes do sound more anginal in nature.  We will obtain an echocardiogram in the morning as she has had worsening dyspnea on exertion for some time, but this is likely related to deconditioning as she has a lack of physical activity.  Otherwise, we will continue her home medications as they are including her beta-blocker and her statin.  At this point in time, we will not place her on full-dose anticoagulation given the atypical nature of some of her symptoms and in the fact that she is pain free currently.  If she were to have recurrent pain, clinical changes or enzyme elevations, we would start her on heparin at that point in time.          ______________________________ Natasha Bence, MD     MH/MEDQ  D:  05/28/2012  T:  05/28/2012  Job:  [id number]

## 2012-05-28 NOTE — ED Notes (Addendum)
Pt sts 1 week with left sided chest pain. sts worsening today. Worse with movement. 2 nitro and 324 ASA PTA. 22LAC. sts also associated with nausea and numbness and tingling in upper extremities. Pain 0/10 upon pt arrival.

## 2012-05-28 NOTE — ED Notes (Signed)
Pharmacy notified again on pt's pending climara .

## 2012-05-28 NOTE — ED Notes (Signed)
ASSUMED CARE ON PT. PT. RESTING , DENIES PAIN OR DISCOMFORT , RESPIRATIONS UNLABORED , IV SITE UNREMARKABLE.

## 2012-05-29 ENCOUNTER — Encounter (HOSPITAL_COMMUNITY): Payer: Self-pay | Admitting: Physician Assistant

## 2012-05-29 DIAGNOSIS — R079 Chest pain, unspecified: Secondary | ICD-10-CM

## 2012-05-29 DIAGNOSIS — R0602 Shortness of breath: Secondary | ICD-10-CM

## 2012-05-29 DIAGNOSIS — E785 Hyperlipidemia, unspecified: Secondary | ICD-10-CM

## 2012-05-29 DIAGNOSIS — I10 Essential (primary) hypertension: Secondary | ICD-10-CM

## 2012-05-29 LAB — CBC
MCH: 31.1 pg (ref 26.0–34.0)
Platelets: 262 10*3/uL (ref 150–400)
RBC: 3.8 MIL/uL — ABNORMAL LOW (ref 3.87–5.11)
WBC: 4.2 10*3/uL (ref 4.0–10.5)

## 2012-05-29 LAB — CARDIAC PANEL(CRET KIN+CKTOT+MB+TROPI)
CK, MB: 1.6 ng/mL (ref 0.3–4.0)
CK, MB: 1.6 ng/mL (ref 0.3–4.0)
Relative Index: INVALID (ref 0.0–2.5)
Relative Index: INVALID (ref 0.0–2.5)
Total CK: 50 U/L (ref 7–177)
Total CK: 56 U/L (ref 7–177)
Troponin I: <0.3 ng/mL (ref <0.30)
Troponin I: <0.3 ng/mL (ref <0.30)
Troponin I: <0.3 ng/mL (ref <0.30)

## 2012-05-29 LAB — CREATININE, SERUM: Creatinine, Ser: 1 mg/dL (ref 0.50–1.10)

## 2012-05-29 MED ORDER — NITROGLYCERIN 0.4 MG SL SUBL: 0.4000 mg | SUBLINGUAL_TABLET | SUBLINGUAL | Status: DC | PRN

## 2012-05-29 MED ORDER — ASPIRIN 81 MG PO CHEW: 324.0000 mg | CHEWABLE_TABLET | ORAL | Status: DC

## 2012-05-29 MED ORDER — ASPIRIN 81 MG PO TBEC: 81.0000 mg | DELAYED_RELEASE_TABLET | Freq: Every day | ORAL | Status: AC

## 2012-05-29 MED ORDER — ASPIRIN EC 81 MG PO TBEC: 81.0000 mg | DELAYED_RELEASE_TABLET | Freq: Every day | ORAL | Status: DC

## 2012-05-29 MED ORDER — ASPIRIN 300 MG RE SUPP: 300.0000 mg | RECTAL | Status: DC

## 2012-05-29 MED ORDER — HEPARIN SODIUM (PORCINE) 5000 UNIT/ML IJ SOLN
5000.0000 [IU] | Freq: Three times a day (TID) | INTRAMUSCULAR | Status: DC
  Administered 2012-05-29: 5000 [IU] via SUBCUTANEOUS
  Filled 2012-05-29 (×4): qty 1

## 2012-05-29 MED ORDER — ACETAMINOPHEN 325 MG PO TABS: 650.0000 mg | ORAL_TABLET | ORAL | Status: DC | PRN

## 2012-05-29 MED ORDER — ONDANSETRON HCL 4 MG/2ML IJ SOLN: 4.0000 mg | Freq: Four times a day (QID) | INTRAMUSCULAR | Status: DC | PRN

## 2012-05-29 NOTE — Progress Notes (Signed)
   Primary cardiologist: Dr. Antoine Poche  Subjective: No recurrent chest pain or arm pain. Breathing stable, no cough or hemoptysis.   Objective: Blood pressure 115/67, pulse 59, temperature 97.9 F (36.6 C), temperature source Oral, resp. rate 20, height 5\' 4"  (1.626 m), weight 176 lb 14.4 oz (80.241 kg), SpO2 96.00%.  Intake/Output Summary (Last 24 hours) at 05/29/12 0811 Last data filed at 05/29/12 0457  Gross per 24 hour  Intake      0 ml  Output    750 ml  Net   -750 ml   Telemetry - Sinus rhythm.  Exam -   General - NAD.  Lungs - Clear, nonlabored.  Cardiac - RRR, no rub or gallop.  Extremities - No pitting.  Lab Results -  Basic Metabolic Panel:  Lab 05/29/12 1610 05/28/12 1836  NA -- 137  K -- 4.8  CL -- 105  CO2 -- 24  GLUCOSE -- 107*  BUN -- 16  CREATININE 1.00 1.03  CALCIUM -- 9.4  MG -- --    Liver Function Tests:  Lab 05/28/12 1836  AST 18  ALT 14  ALKPHOS 50  BILITOT 0.2*  PROT 6.5  ALBUMIN 3.7    CBC:  Lab 05/29/12 0022 05/28/12 1836  WBC 4.2 4.1  HGB 11.8* 12.2  HCT 34.5* 35.5*  MCV 90.8 91.5  PLT 262 272    Cardiac Enzymes:  Lab 05/29/12 0522 05/29/12 0022 05/28/12 1838  CKTOTAL 52 50 --  CKMB 1.5 1.6 --  CKMBINDEX -- -- --  TROPONINI <0.30 <0.30 <0.30   ECG - Normal sinus rhythm.  Chest x-ray 6/1: IMPRESSION:  No acute cardiopulmonary abnormality.   Current Medications    . aspirin EC  81 mg Oral Daily  . citalopram  20 mg Oral Daily  . estradiol  0.1 mg Transdermal Weekly  . heparin  5,000 Units Subcutaneous Q8H  . methocarbamol  750 mg Oral BID  . nabumetone  1,000 mg Oral BID  . nortriptyline  25 mg Oral QHS  . propranolol  10 mg Oral BID  . simvastatin  40 mg Oral QHS  . DISCONTD: aspirin  324 mg Oral NOW  . DISCONTD: aspirin  300 mg Rectal NOW    Assessment:  1. Chest pain syndrome, largely atypical in description, resolved at present. Cardiac markers are normal, chest x-ray without acute changes,  ECG also normal. No arrhythmias by telemetry.  2. Hypertension history.  3. Hyperlipidemia, on Zocor.  Plan:  Patient to ambulate in the hall. If she remains symptomatically stable, anticipate discharge home with plan for a followup Lexiscan Myoview in the office this week. Followup to be arranged with extender or Dr. Antoine Poche for review. At present no change to regular medications.   Jonelle Sidle, M.D., F.A.C.C.

## 2012-05-29 NOTE — Discharge Summary (Signed)
See also rounding note from 6/2.

## 2012-05-29 NOTE — Discharge Instructions (Signed)
PLEASE REMEMBER TO BRING ALL OF YOUR MEDICATIONS TO EACH OF YOUR FOLLOW-UP OFFICE VISITS.  PLEASE ATTEND ALL FOLLOW-UP APPOINTMENTS.  * No food or drink after midnight the night before your stress test  * No caffeine the day of your stress test.  * Don't take your propanolol (Inderal) the morning of the stress test

## 2012-05-29 NOTE — Progress Notes (Signed)
  Echocardiogram 2D Echocardiogram has been performed.  Jamita Mckelvin L 05/29/2012, 9:58 AM

## 2012-05-29 NOTE — Progress Notes (Signed)
05/29/2012 2:00 PM Nursing note Discharge avs form, medications already taken today and those due this evening given and explained to patient and family. F/u appointmetns and special instructions for stress test also reviewed. When to call the doctor reviewed. D/c iv line. D/c tele. D/c home per orders. Denton Derks, Blanchard Kelch

## 2012-05-29 NOTE — Discharge Summary (Signed)
Discharge Summary   Patient ID: Nicole Ruiz,  MRN: [id number], DOB/AGE: 08/09/1941 71 y.o.  Admit date: 05/28/2012 Discharge date: 05/29/2012  Discharge Diagnoses Principal Problem:  *CHEST PAIN Active Problems:  HYPERLIPIDEMIA  Hypertension   Allergies No Known Allergies  Diagnostic Studies/Procedures  2D ECHOCARDIOGRAM- 05/29/12  Formal interpretation pending  History of Present Illness  Ms. Csaszar is a 71yo female with the above problem list, no known CAD, family history of CAD and history of an unspecified GU malignancy who was admitted to [facility] on 05/28/12 for atypical chest pain.   She reportedly experienced two episodes of chest pain the day of admission. The initial episode occurred at 10:30 AM this morning, described as a severe sharp, stabbing, L-sided chest pain radiating to her neck, L-arm, associated with shortness of breath, diaphoresis and mild lightheadedness, lasting for 2-3 hours, aggravated by movement and relieved with rest. The pain remitted, then recurred at 4:30 PM while she was at an auction with similar symptoms. She denied palpitations or syncope. This prompted her transport to the ED via EMS. She was given NTG SL x 2 en route, and found to pain free in the ED.  On interview, she denied aggravation with inspiration. She noted increased DOE over the last year and a 10 lbs weight gain. She reportedly had undergone stress testing 3 years ago which was normal.   EKG revealed no acute ischemic changes. Initial troponin-I was WNL. CBC and BMET were unremarkable. Although her chest pain was atypical, her cardiac risk factors warranted formal ACS rule-out with consideration of stress testing the following day.   Hospital Course   She was admitted to telemetry and continued on outpatient medications including BB and Zocor. She was started on heparin for VTE prophylaxis. ASA was added.   She remained chest pain free overnight without acute  events. Two subsequent sets of cardiac enzymes returned WNL. EKG this AM revealed no evidence of ischemia. A 2D echocardiogram was performed. This was interpreted by Dr. Diona Browner as normal. Formal interpretation to follow.   She was assessed by Dr. Diona Browner today, and deemed appropriate for discharge if she ambulated well. She did so without incident or aggravation of her chest pain. Upon review of the echo, she will be discharged today. She will continue all outpatient medications. Low-dose daily ASA will be added. She will be scheduled for an outpatient Lexiscan Myoview later this week, and follow-up with either Dr. Antoine Poche or a PA/NP in the office the week after. This information, including outpatient stress test instructions, is clearly outlined in the discharge AVS.   Discharge Vitals:  Blood pressure 115/67, pulse 59, temperature 97.9 F (36.6 C), temperature source Oral, resp. rate 20, height 5\' 4"  (1.626 m), weight 80.241 kg (176 lb 14.4 oz), SpO2 96.00%.   Labs: Recent Labs  Basename 05/29/12 0022 05/28/12 1836   WBC 4.2 4.1   HGB 11.8* 12.2   HCT 34.5* 35.5*   MCV 90.8 91.5   PLT 262 272    Lab 05/29/12 0022 05/28/12 1836  NA -- 137  K -- 4.8  CL -- 105  CO2 -- 24  BUN -- 16  CREATININE 1.00 1.03  CALCIUM -- 9.4  PROT -- 6.5  BILITOT -- 0.2*  ALKPHOS -- 50  ALT -- 14  AST -- 18  AMYLASE -- --  LIPASE -- 30  GLUCOSE -- 107*   Recent Labs  Basename 05/29/12 0522 05/29/12 0022 05/28/12 1838   CKTOTAL 52  50 --   CKMB 1.5 1.6 --   CKMBINDEX -- -- --   TROPONINI <0.30 <0.30 <0.30   Disposition:   Follow-up Information    Follow up with [facility]. (Office will call you with appointment date and time of nuclear stress test. )    Contact information:   [address] [place] [address]       Follow up with [facility] in 2 weeks. (For follow-up and interpretation of your stress test. Office will contact you with  appointment date and time. )    Contact information:   [address] [place] [address]         Discharge Medications:  Medication List  As of 05/29/2012 11:09 AM   ASK your doctor about these medications         citalopram 20 MG tablet   Commonly known as: CELEXA      estradiol 0.1 mg/24hr   Commonly known as: CLIMARA - Dosed in mg/24 hr      methocarbamol 750 MG tablet   Commonly known as: ROBAXIN      nabumetone 500 MG tablet   Commonly known as: RELAFEN      nortriptyline 25 MG capsule   Commonly known as: PAMELOR      propranolol 10 MG tablet   Commonly known as: INDERAL      simvastatin 40 MG tablet   Commonly known as: ZOCOR           Outstanding Labs/Studies: Formal 2D echo interpretation  Duration of Discharge Encounter: Greater than 30 minutes including physician time.  Signed, R. Hurman Horn, PA-C 05/29/2012, 11:09 AM

## 2012-06-06 ENCOUNTER — Ambulatory Visit (HOSPITAL_COMMUNITY): Payer: Medicare PPO | Attending: Cardiology | Admitting: Radiology

## 2012-06-06 VITALS — BP 131/61 | HR 63 | Ht 64.0 in | Wt 175.0 lb

## 2012-06-06 DIAGNOSIS — R079 Chest pain, unspecified: Secondary | ICD-10-CM | POA: Insufficient documentation

## 2012-06-06 DIAGNOSIS — R0602 Shortness of breath: Secondary | ICD-10-CM

## 2012-06-06 DIAGNOSIS — R42 Dizziness and giddiness: Secondary | ICD-10-CM | POA: Insufficient documentation

## 2012-06-06 DIAGNOSIS — R0789 Other chest pain: Secondary | ICD-10-CM

## 2012-06-06 DIAGNOSIS — R55 Syncope and collapse: Secondary | ICD-10-CM

## 2012-06-06 MED ORDER — TECHNETIUM TC 99M TETROFOSMIN IV KIT
11.0000 | PACK | Freq: Once | INTRAVENOUS | Status: AC | PRN
  Administered 2012-06-06: 11 via INTRAVENOUS

## 2012-06-06 MED ORDER — TECHNETIUM TC 99M TETROFOSMIN IV KIT
33.0000 | PACK | Freq: Once | INTRAVENOUS | Status: AC | PRN
  Administered 2012-06-06: 33 via INTRAVENOUS

## 2012-06-06 MED ORDER — REGADENOSON 0.4 MG/5ML IV SOLN
0.4000 mg | Freq: Once | INTRAVENOUS | Status: AC
  Administered 2012-06-06: 0.4 mg via INTRAVENOUS

## 2012-06-06 NOTE — Progress Notes (Signed)
[facility] SITE 3 NUCLEAR MED [address] [place] [address] [phone]  Cardiology Nuclear Med Study  Nicole Ruiz is a 71 y.o. female     MRN : [id number]     DOB: 1941-02-12  Procedure Date: 06/06/2012  Nuclear Med Background Indication for Stress Test:  Evaluation for Ischemia and Post Hospital:05/29/12 with atypical CP and (-) enzymes History:  '10 OZH:YQMVHQ, EF=84%; 05/29/12 Echo:EF=55-60% Cardiac Risk Factors: Family History - CAD, Hypertension and Lipids  Symptoms:  Chest Pain with and without Exertion (last episode of chest discomfort:none since hospital d/c), Dizziness, DOE, Fatigue, Near Syncope, Rapid HR and SOB  ION:GEXBMWU fell out of bed Sunday night and hit head on night stand, ? LOC before and after fall.   Nuclear Pre-Procedure Caffeine/Decaff Intake:  None NPO After: 7:00pm   Lungs:  clear O2 Sat: 100% on room air. IV 0.9% NS with Angio Cath:  22g  IV Site: R Hand  IV Started by:  Cathlyn Parsons, RN  Chest Size (in):  40 Cup Size: DDD/F  Height: 5\' 4"  (1.626 m)  Weight:  175 lb (79.379 kg)  BMI:  Body mass index is 30.04 kg/(m^2). Tech Comments:  Propranolol held x 24 hrs    Nuclear Med Study 1 or 2 day study: 1 day  Stress Test Type:  Treadmill/Lexiscan  Reading MD: Marca Ancona, MD  Order Authorizing Provider:  [name] ,MD  Resting Radionuclide: Technetium 64m Tetrofosmin  Resting Radionuclide Dose: 11.0 mCi   Stress Radionuclide:  Technetium 37m Tetrofosmin  Stress Radionuclide Dose: 33.0 mCi           Stress Protocol Rest HR: 63 Stress HR: 98  Rest BP: 131/61 Stress BP: 125/79  Exercise Time (min): 2:00 METS: n/a   Predicted Max HR: 149 bpm % Max HR: 65.77 bpm Rate Pressure Product: 13244   Dose of Adenosine (mg):  n/a Dose of Lexiscan: 0.4 mg  Dose of Atropine (mg): n/a Dose of Dobutamine: n/a mcg/kg/min (at max HR)  Stress Test Technologist: Smiley Houseman, CMA-N  Nuclear Technologist:  Domenic Polite,  CNMT     Rest Procedure:  Myocardial perfusion imaging was performed at rest 45 minutes following the intravenous administration of Technetium 46m Tetrofosmin.  Rest ECG: No acute changes  Stress Procedure:  The patient received IV Lexiscan 0.4 mg over 15-seconds with concurrent low level exercise and then Technetium 86m Tetrofosmin was injected at 30-seconds while the patient continued walking one more minute. There were nonspecific T-wave changes and occasional PVC's with Lexiscan. Quantitative spect images were obtained after a 45-minute delay.  Stress ECG: No significant change from baseline ECG  QPS Raw Data Images:  Prominent breast shadowing.  Stress Images:  Small, moderate apical perfusion defect.  Rest Images:  Normal homogeneous uptake in all areas of the myocardium. Subtraction (SDS):  Small, moderate reversible apical perfusion defect.  Transient Ischemic Dilatation (Normal <1.22):  1.19 Lung/Heart Ratio (Normal <0.45):  0.41  Quantitative Gated Spect Images QGS EDV:  42 ml QGS ESV:  9 ml  Impression Exercise Capacity:  Adenosine with low level exercise. BP Response:  Normal blood pressure response. Clinical Symptoms:  Short of breath.  ECG Impression:  No significant ST segment change suggestive of ischemia. Comparison with Prior Nuclear Study: Prior study report showed a relatively fixed apical perfusion defect.   Overall Impression:  Low risk stress nuclear study.  There was prominent breast shadowing at stress and rest.  There was a small, moderate  reversible apical perfusion defect that could be shifting breast artifact but cannot rule out ischemia. Fixed defect at apex by report on prior myoview (images not available) points more towards artifact.   LV Ejection Fraction: 79%.  LV Wall Motion:  NL LV Function; NL Wall Motion  [employer]

## 2012-06-07 ENCOUNTER — Encounter: Payer: Self-pay | Admitting: Physician Assistant

## 2012-06-07 ENCOUNTER — Ambulatory Visit (INDEPENDENT_AMBULATORY_CARE_PROVIDER_SITE_OTHER): Payer: Medicare PPO | Admitting: Physician Assistant

## 2012-06-07 ENCOUNTER — Encounter: Payer: Self-pay | Admitting: *Deleted

## 2012-06-07 VITALS — BP 138/72 | HR 72 | Ht 64.0 in | Wt 178.0 lb

## 2012-06-07 DIAGNOSIS — R079 Chest pain, unspecified: Secondary | ICD-10-CM

## 2012-06-07 DIAGNOSIS — R9439 Abnormal result of other cardiovascular function study: Secondary | ICD-10-CM

## 2012-06-07 DIAGNOSIS — K219 Gastro-esophageal reflux disease without esophagitis: Secondary | ICD-10-CM

## 2012-06-07 DIAGNOSIS — R002 Palpitations: Secondary | ICD-10-CM

## 2012-06-07 NOTE — H&P (Signed)
History and Physical    Date:  06/07/2012   Name:  Nicole Ruiz   DOB:  [birth year]   MRN:  [id number]  PCP:  Aura Dials, MD, MD  Primary Cardiologist:  Dr. [name]  Primary Electrophysiologist:  None    History of Present Illness: Nicole Ruiz is a 71 y.o. female who returns for post hospital follow up.  She has a history of hypertension and hyperlipidemia.  She was admitted 6/1-6/2 with chest pain.  Myocardial infarction was ruled out.  Chest x-ray was unremarkable.  Telemetry remained normal as well.  It was felt her symptoms were largely atypical and she was discharged home with plans for outpatient stress testing.  Echocardiogram 05/29/12: Normal wall thickness, EF 55-60%, normal wall motion, grade 1 diastolic dysfunction, trivial MR, trivial TR.  Lexiscan Myoview performed 06/06/12:  Low-risk, prominent breast shadowing at stress and rest, small, moderate reversible apical perfusion defect to be shifting breast artifact but could not rule out ischemia, fixed defect in the apex by report on prior Myoview, points more towards artifact, EF 79%, normal wall motion.    She has not had recurrent chest pain like her presenting symptoms.  She does describe exertional shortness of breath and exertional chest tightness.  This has been present for the last 2 years.  It may be getting worse.  She denies syncope.  She does note some left arm pain associated with her chest tightness.  She denies orthopnea or PND.  She denies significant pedal edema.  Wt Readings from Last 3 Encounters:  06/07/12 178 lb (80.74 kg)  06/06/12 175 lb (79.379 kg)  05/28/12 176 lb 14.4 oz (80.241 kg)     Potassium  Date/Time Value Range Status  05/28/2012  6:36 PM 4.8  3.5-5.1 (mEq/L) Final     Creatinine, Ser  Date/Time Value Range Status  05/29/2012 12:22 AM 1.00  0.50-1.10 (mg/dL) Final     ALT  Date/Time Value Range Status  05/28/2012  6:36 PM 14  0-35 (U/L) Final     Hemoglobin  Date/Time Value  Range Status  05/29/2012 12:22 AM 11.8* 12.0-15.0 (g/dL) Final    Past Medical History  Diagnosis Date  . Hypertension   . Hyperlipidemia   . 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 aspirin EC 81 MG EC tablet Take 1 tablet (81 mg total) by mouth daily.      . calcium carbonate (OS-CAL) 600 MG TABS Take 600 mg by mouth 2 (two) times daily with a meal.      . Cholecalciferol (VITAMIN D3) 2000 UNITS TABS Take 1 tablet by mouth daily.      . citalopram (CELEXA) 20 MG tablet Take 20 mg by mouth daily.      . cyanocobalamin 1000 MCG tablet Take 100 mcg by mouth daily.      [name] estradiol (CLIMARA - DOSED IN MG/24 HR) 0.1 mg/24hr Place 1 patch onto the skin once a week. Places on Saturdays.      . methocarbamol (ROBAXIN) 750 MG tablet Take 750 mg by mouth 2 (two) times daily.      . nabumetone (RELAFEN) 500 MG tablet Take 1,000 mg by mouth 2 (two) times daily.       . nortriptyline (PAMELOR) 25 MG capsule Take 25 mg by mouth at bedtime.      . Omeprazole Magnesium (PRILOSEC OTC PO) Take 1 capsule by mouth daily.      [name]  OVER THE COUNTER MEDICATION Take 1 capsule by mouth daily. ( MEGA RED )      . propranolol (INDERAL) 10 MG tablet Take 10 mg by mouth 2 (two) times daily.      [name] pyridOXINE (VITAMIN B-6) 100 MG tablet Take 100 mg by mouth daily.      . simvastatin (ZOCOR) 40 MG tablet Take 40 mg by mouth at bedtime.      . vitamin E 400 UNIT capsule Take 400 Units by mouth daily.        Allergies: No Known Allergies  History  Substance Use Topics  . Smoking status: Never Smoker   . Smokeless tobacco: Never Used  . Alcohol Use: No     ROS:  Please see the history of present illness.   She does note symptoms of dysphagia and odynophagia.  She denies melena or hematochezia, vomiting or diarrhea.  All other systems reviewed and negative.   PHYSICAL EXAM: VS:  BP 138/72  Pulse 72  Ht 5\' 4"  (1.626 m)  Wt 178 lb (80.74 kg)   BMI 30.55 kg/m2 Well nourished, well developed, in no acute distress HEENT: normal Neck: no JVD Vascular: No carotid bruits Endocrine: No thyromegaly Cardiac:  normal S1, S2; RRR; no murmur Lungs:  clear to auscultation bilaterally, no wheezing, rhonchi or rales Abd: soft, nontender, no hepatomegaly Ext: no edema Skin: warm and dry Neuro:  CNs 2-12 intact, no focal abnormalities noted Psych: Normal affect  EKG:  Sinus rhythm, heart rate 72, leftward axis, nonspecific ST-T wave changes   ASSESSMENT AND PLAN:  1.  Chest Pain She has atypical and typical features to her chest pain.  Her stress Myoview is low risk.  However, ischemia cannot be ruled out.  Her risk factors for coronary artery disease include hypertension, hyperlipidemia, age, gender and Remote family history.  I discussed her case today with Dr. Antoine Poche.  Given the results of her Myoview and her overall symptoms, we feel it is best to proceed with cardiac catheterization for definitive diagnosis.  Risks and benefits of cardiac catheterization have been discussed with the patient.  These include bleeding, infection, kidney damage, stroke, heart attack, death.  The patient understands these risks and is willing to proceed.  Continue ASA.    Of note, she has some right groin pain ongoing for the last several weeks.  If possible, she should be a wrist case.  D/w Dr. [name].    2.  GERD She has symptoms of dysphagia. I advised her to follow up with her PCP for further management.  She likely needs referral to GI.   Luna Glasgow, PA-C  12:31 PM 06/07/2012

## 2012-06-07 NOTE — Patient Instructions (Signed)
Your physician has requested that you have a LEFT cardiac catheterization WITH DR. HOCHREIN 06/21/12 @ 8:30 AM. Cardiac catheterization is used to diagnose and/or treat various heart conditions. Doctors may recommend this procedure for a number of different reasons. The most common reason is to evaluate chest pain. Chest pain can be a symptom of coronary artery disease (CAD), and cardiac catheterization can show whether plaque is narrowing or blocking your heart's arteries. This procedure is also used to evaluate the valves, as well as measure the blood flow and oxygen levels in different parts of your heart. For further information please visit [email or web address] Please follow instruction sheet, as given.  PRE CATH LABS TO BE DONE 06/17/12 COME IN AROUND 8:30-9 AM

## 2012-06-07 NOTE — Progress Notes (Signed)
[address] [place], [place]  [address] Phone: [phone] Fax:  [phone]  Date:  06/07/2012   Name:  Nicole Ruiz   DOB:  [birth year]   MRN:  [id number]  PCP:  Aura Dials, MD, MD  Primary Cardiologist:  Dr. [name]  Primary Electrophysiologist:  None    History of Present Illness: Nicole Ruiz is a 71 y.o. female who returns for post hospital follow up.  She has a history of hypertension and hyperlipidemia.  She was admitted 6/1-6/2 with chest pain.  Myocardial infarction was ruled out.  Chest x-ray was unremarkable.  Telemetry remained normal as well.  It was felt her symptoms were largely atypical and she was discharged home with plans for outpatient stress testing.  Echocardiogram 05/29/12: Normal wall thickness, EF 55-60%, normal wall motion, grade 1 diastolic dysfunction, trivial MR, trivial TR.  Lexiscan Myoview performed 06/06/12:  Low-risk, prominent breast shadowing at stress and rest, small, moderate reversible apical perfusion defect to be shifting breast artifact but could not rule out ischemia, fixed defect in the apex by report on prior Myoview, points more towards artifact, EF 79%, normal wall motion.    She has not had recurrent chest pain like her presenting symptoms.  She does describe exertional shortness of breath and exertional chest tightness.  This has been present for the last 2 years.  It may be getting worse.  She denies syncope.  She does note some left arm pain associated with her chest tightness.  She denies orthopnea or PND.  She denies significant pedal edema.  Wt Readings from Last 3 Encounters:  06/07/12 178 lb (80.74 kg)  06/06/12 175 lb (79.379 kg)  05/28/12 176 lb 14.4 oz (80.241 kg)     Potassium  Date/Time Value Range Status  05/28/2012  6:36 PM 4.8  3.5-5.1 (mEq/L) Final     Creatinine, Ser  Date/Time Value Range Status  05/29/2012 12:22 AM 1.00  0.50-1.10 (mg/dL) Final     ALT  Date/Time Value Range  Status  05/28/2012  6:36 PM 14  0-35 (U/L) Final     Hemoglobin  Date/Time Value Range Status  05/29/2012 12:22 AM 11.8* 12.0-15.0 (g/dL) Final    Past Medical History  Diagnosis Date  . Hypertension   . Hyperlipidemia   . 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 aspirin EC 81 MG EC tablet Take 1 tablet (81 mg total) by mouth daily.      . calcium carbonate (OS-CAL) 600 MG TABS Take 600 mg by mouth 2 (two) times daily with a meal.      . Cholecalciferol (VITAMIN D3) 2000 UNITS TABS Take 1 tablet by mouth daily.      . citalopram (CELEXA) 20 MG tablet Take 20 mg by mouth daily.      . cyanocobalamin 1000 MCG tablet Take 100 mcg by mouth daily.      [name] estradiol (CLIMARA - DOSED IN MG/24 HR) 0.1 mg/24hr Place 1 patch onto the skin once a week. Places on Saturdays.      . methocarbamol (ROBAXIN) 750 MG tablet Take 750 mg by mouth 2 (two) times daily.      . nabumetone (RELAFEN) 500 MG tablet Take 1,000 mg by mouth 2 (two) times daily.       . nortriptyline (PAMELOR) 25 MG capsule Take 25 mg by mouth at bedtime.      . Omeprazole  Magnesium (PRILOSEC OTC PO) Take 1 capsule by mouth daily.      [name] OVER THE COUNTER MEDICATION Take 1 capsule by mouth daily. ( MEGA RED )      . propranolol (INDERAL) 10 MG tablet Take 10 mg by mouth 2 (two) times daily.      [name] pyridOXINE (VITAMIN B-6) 100 MG tablet Take 100 mg by mouth daily.      . simvastatin (ZOCOR) 40 MG tablet Take 40 mg by mouth at bedtime.      . vitamin E 400 UNIT capsule Take 400 Units by mouth daily.        Allergies: No Known Allergies  History  Substance Use Topics  . Smoking status: Never Smoker   . Smokeless tobacco: Never Used  . Alcohol Use: No     ROS:  Please see the history of present illness.   She does note symptoms of dysphagia and odynophagia.  She denies melena or hematochezia, vomiting or diarrhea.  All other systems reviewed and negative.    PHYSICAL EXAM: VS:  BP 138/72  Pulse 72  Ht 5\' 4"  (1.626 m)  Wt 178 lb (80.74 kg)  BMI 30.55 kg/m2 Well nourished, well developed, in no acute distress HEENT: normal Neck: no JVD Vascular: No carotid bruits Endocrine: No thyromegaly Cardiac:  normal S1, S2; RRR; no murmur Lungs:  clear to auscultation bilaterally, no wheezing, rhonchi or rales Abd: soft, nontender, no hepatomegaly Ext: no edema Skin: warm and dry Neuro:  CNs 2-12 intact, no focal abnormalities noted Psych: Normal affect  EKG:  Sinus rhythm, heart rate 72, leftward axis, nonspecific ST-T wave changes   ASSESSMENT AND PLAN:  1.  Chest Pain She has atypical and typical features to her chest pain.  Her stress Myoview is low risk.  However, ischemia cannot be ruled out.  Her risk factors for coronary artery disease include hypertension, hyperlipidemia, age, gender and Remote family history.  I discussed her case today with Dr. Antoine Poche.  Given the results of her Myoview and her overall symptoms, we feel it is best to proceed with cardiac catheterization for definitive diagnosis.  Risks and benefits of cardiac catheterization have been discussed with the patient.  These include bleeding, infection, kidney damage, stroke, heart attack, death.  The patient understands these risks and is willing to proceed.  Continue ASA.    Of note, she has some right groin pain ongoing for the last several weeks.  If possible, she should be a wrist case.  D/w Dr. [name].    2.  GERD She has symptoms of dysphagia. I advised her to follow up with her PCP for further management.  She likely needs referral to GI.   Luna Glasgow, PA-C  12:31 PM 06/07/2012

## 2012-06-17 ENCOUNTER — Other Ambulatory Visit (INDEPENDENT_AMBULATORY_CARE_PROVIDER_SITE_OTHER): Payer: Medicare PPO

## 2012-06-17 DIAGNOSIS — R9439 Abnormal result of other cardiovascular function study: Secondary | ICD-10-CM

## 2012-06-17 DIAGNOSIS — R079 Chest pain, unspecified: Secondary | ICD-10-CM

## 2012-06-17 LAB — CBC WITH DIFFERENTIAL/PLATELET
Basophils Relative: 0.4 % (ref 0.0–3.0)
Eosinophils Absolute: 0.2 10*3/uL (ref 0.0–0.7)
HCT: 36.8 % (ref 36.0–46.0)
Hemoglobin: 12.5 g/dL (ref 12.0–15.0)
Lymphocytes Relative: 24.7 % (ref 12.0–46.0)
MCHC: 33.8 g/dL (ref 30.0–36.0)
MCV: 92.9 fl (ref 78.0–100.0)
Monocytes Absolute: 0.4 10*3/uL (ref 0.1–1.0)
Neutro Abs: 3 10*3/uL (ref 1.4–7.7)
RBC: 3.96 Mil/uL (ref 3.87–5.11)

## 2012-06-17 LAB — BASIC METABOLIC PANEL
CO2: 26 mEq/L (ref 19–32)
Calcium: 9 mg/dL (ref 8.4–10.5)
Glucose, Bld: 91 mg/dL (ref 70–99)
Sodium: 140 mEq/L (ref 135–145)

## 2012-06-21 ENCOUNTER — Encounter (HOSPITAL_BASED_OUTPATIENT_CLINIC_OR_DEPARTMENT_OTHER)
Admission: RE | Disposition: A | Discharge status: Home / Self Care | Payer: Self-pay | Source: Ambulatory Visit | Attending: Cardiology

## 2012-06-21 ENCOUNTER — Inpatient Hospital Stay (HOSPITAL_BASED_OUTPATIENT_CLINIC_OR_DEPARTMENT_OTHER)
Admission: RE | Admit: 2012-06-21 | Discharge: 2012-06-21 | Disposition: A | Discharge status: Home / Self Care | Payer: Medicare PPO | Source: Ambulatory Visit | Attending: Cardiology | Admitting: Cardiology

## 2012-06-21 DIAGNOSIS — I10 Essential (primary) hypertension: Secondary | ICD-10-CM | POA: Insufficient documentation

## 2012-06-21 DIAGNOSIS — R079 Chest pain, unspecified: Secondary | ICD-10-CM

## 2012-06-21 DIAGNOSIS — I2510 Atherosclerotic heart disease of native coronary artery without angina pectoris: Secondary | ICD-10-CM

## 2012-06-21 DIAGNOSIS — E785 Hyperlipidemia, unspecified: Secondary | ICD-10-CM | POA: Insufficient documentation

## 2012-06-21 DIAGNOSIS — R9439 Abnormal result of other cardiovascular function study: Secondary | ICD-10-CM

## 2012-06-21 DIAGNOSIS — K219 Gastro-esophageal reflux disease without esophagitis: Secondary | ICD-10-CM | POA: Insufficient documentation

## 2012-06-21 SURGERY — JV LEFT HEART CATHETERIZATION WITH CORONARY ANGIOGRAM
Anesthesia: Moderate Sedation

## 2012-06-21 MED ORDER — ASPIRIN 81 MG PO CHEW
324.0000 mg | CHEWABLE_TABLET | ORAL | Status: AC
  Administered 2012-06-21: 324 mg via ORAL

## 2012-06-21 MED ORDER — SODIUM CHLORIDE 0.9 % IV SOLN: 250.0000 mL | INTRAVENOUS | Status: DC | PRN

## 2012-06-21 MED ORDER — ONDANSETRON HCL 4 MG/2ML IJ SOLN: 4.0000 mg | Freq: Four times a day (QID) | INTRAMUSCULAR | Status: DC | PRN

## 2012-06-21 MED ORDER — SODIUM CHLORIDE 0.9 % IJ SOLN: 3.0000 mL | Freq: Two times a day (BID) | INTRAMUSCULAR | Status: DC

## 2012-06-21 MED ORDER — SODIUM CHLORIDE 0.9 % IV SOLN: INTRAVENOUS | Status: DC

## 2012-06-21 MED ORDER — ACETAMINOPHEN 325 MG PO TABS: 650.0000 mg | ORAL_TABLET | ORAL | Status: DC | PRN

## 2012-06-21 MED ORDER — SODIUM CHLORIDE 0.9 % IJ SOLN: 3.0000 mL | INTRAMUSCULAR | Status: DC | PRN

## 2012-06-21 MED ORDER — SODIUM CHLORIDE 0.9 % IV SOLN
INTRAVENOUS | Status: DC
  Administered 2012-06-21: 08:00:00 via INTRAVENOUS

## 2012-06-21 NOTE — Progress Notes (Signed)
Bedrest begins @ 0930.  Tegaderm dressing applied to right groin site by Venda Rodes.  Dr. Antoine Poche in to discuss results with patient and family.

## 2012-06-21 NOTE — Progress Notes (Signed)
Allen's test performed on right hand with normal results.  

## 2012-06-21 NOTE — Interval H&P Note (Signed)
History and Physical Interval Note:  06/21/2012 8:41 AM  Nicole Ruiz  has presented today for surgery, with the diagnosis of chest pain  The various methods of treatment have been discussed with the patient and family. After consideration of risks, benefits and other options for treatment, the patient has consented to  Procedure(s) (LRB): JV LEFT HEART CATHETERIZATION WITH CORONARY ANGIOGRAM (N/A) as a surgical intervention .  The patient's history has been reviewed, patient examined, no change in status, stable for surgery.  I have reviewed the patients' chart and labs.  Questions were answered to the patient's satisfaction.     [name]

## 2012-06-21 NOTE — OR Nursing (Signed)
Meal served 

## 2012-06-21 NOTE — CV Procedure (Signed)
  Cardiac Catheterization Procedure Note  Name: Nicole Ruiz MRN: [id number] DOB: [birth year]  Procedure: Left Heart Cath, Selective Coronary Angiography, LV angiography  Indication:  Chest pain.  Abnormal stress perfusion study.  Procedural details: The right groin was prepped, draped, and anesthetized with 1% lidocaine. Using modified Seldinger technique, a 4French sheath was introduced into the right femoral artery. Standard Judkins catheters were used for coronary angiography and left ventriculography. Catheter exchanges were performed over a guidewire. There were no immediate procedural complications. The patient was transferred to the post catheterization recovery area for further monitoring.  Procedural Findings:  Hemodynamics:     AO 169/81    LV 172/28   Coronary angiography:   Coronary dominance: Right  Left mainstem:   Normal  Left anterior descending (LAD):   Large wrapping the apex.  Normal.  D1 moderate size with ostial 30%  Left circumflex (LCx):  AV groove normal.  MOM moderate size.  Normal  Right coronary artery (RCA):  Dominant.  Large normal.  PDA moderate sized and normal.  Left ventriculography: Left ventricular systolic function is normal, LVEF is estimated at 65%, there is no significant mitral regurgitation   Final Conclusions:  Minimal coronary plaque.  NL LV  Recommendations: Continue primary risk reduction.    [name] 06/21/2012, 9:04 AM

## 2013-09-19 DIAGNOSIS — M1710 Unilateral primary osteoarthritis, unspecified knee: Secondary | ICD-10-CM | POA: Insufficient documentation

## 2013-09-19 DIAGNOSIS — M899 Disorder of bone, unspecified: Secondary | ICD-10-CM | POA: Insufficient documentation

## 2013-09-19 DIAGNOSIS — F329 Major depressive disorder, single episode, unspecified: Secondary | ICD-10-CM | POA: Insufficient documentation

## 2013-09-19 DIAGNOSIS — E559 Vitamin D deficiency, unspecified: Secondary | ICD-10-CM | POA: Insufficient documentation

## 2013-09-19 DIAGNOSIS — N183 Chronic kidney disease, stage 3 unspecified: Secondary | ICD-10-CM | POA: Insufficient documentation

## 2013-09-19 DIAGNOSIS — M949 Disorder of cartilage, unspecified: Secondary | ICD-10-CM

## 2013-09-19 DIAGNOSIS — R9389 Abnormal findings on diagnostic imaging of other specified body structures: Secondary | ICD-10-CM | POA: Insufficient documentation

## 2013-09-19 DIAGNOSIS — F32A Depression, unspecified: Secondary | ICD-10-CM | POA: Insufficient documentation

## 2013-09-19 DIAGNOSIS — N281 Cyst of kidney, acquired: Secondary | ICD-10-CM | POA: Insufficient documentation

## 2013-12-14 ENCOUNTER — Non-Acute Institutional Stay (SKILLED_NURSING_FACILITY): Payer: Medicare PPO | Admitting: Internal Medicine

## 2013-12-14 DIAGNOSIS — I10 Essential (primary) hypertension: Secondary | ICD-10-CM

## 2013-12-14 DIAGNOSIS — M1710 Unilateral primary osteoarthritis, unspecified knee: Secondary | ICD-10-CM

## 2013-12-14 DIAGNOSIS — D62 Acute posthemorrhagic anemia: Secondary | ICD-10-CM

## 2013-12-14 DIAGNOSIS — K219 Gastro-esophageal reflux disease without esophagitis: Secondary | ICD-10-CM

## 2013-12-18 ENCOUNTER — Encounter: Payer: Self-pay | Admitting: Internal Medicine

## 2013-12-18 ENCOUNTER — Encounter: Payer: Self-pay | Admitting: Family

## 2013-12-18 ENCOUNTER — Non-Acute Institutional Stay (SKILLED_NURSING_FACILITY): Payer: Medicare PPO | Admitting: Family

## 2013-12-18 DIAGNOSIS — K219 Gastro-esophageal reflux disease without esophagitis: Secondary | ICD-10-CM | POA: Insufficient documentation

## 2013-12-18 DIAGNOSIS — F329 Major depressive disorder, single episode, unspecified: Secondary | ICD-10-CM

## 2013-12-18 DIAGNOSIS — I10 Essential (primary) hypertension: Secondary | ICD-10-CM

## 2013-12-18 DIAGNOSIS — M1710 Unilateral primary osteoarthritis, unspecified knee: Secondary | ICD-10-CM | POA: Insufficient documentation

## 2013-12-18 DIAGNOSIS — D62 Acute posthemorrhagic anemia: Secondary | ICD-10-CM | POA: Insufficient documentation

## 2013-12-18 DIAGNOSIS — Z96659 Presence of unspecified artificial knee joint: Secondary | ICD-10-CM

## 2013-12-18 DIAGNOSIS — E785 Hyperlipidemia, unspecified: Secondary | ICD-10-CM

## 2013-12-18 DIAGNOSIS — Z96651 Presence of right artificial knee joint: Secondary | ICD-10-CM

## 2013-12-18 NOTE — Progress Notes (Signed)
Patient ID: Nicole Ruiz, female   DOB: 1941/08/29, 72 y.o.   MRN: [id number]  Date: 12/18/13 Facility: [facility]  Code Status:  @emerg @  Chief Complaint  Patient presents with  . Discharge Note    HPI: Pt was admitted for short-term rehabilitation s/p R TKR. Pt and healthcare team denies issues/concerns at present time.      No Known Allergies    Medication List       This list is accurate as of: 12/18/13  4:02 PM.  Always use your most recent med list.               calcium carbonate 600 MG Tabs tablet  Commonly known as:  OS-CAL  Take 600 mg by mouth 2 (two) times daily with a meal.     citalopram 20 MG tablet  Commonly known as:  CELEXA  Take 20 mg by mouth daily.     cyanocobalamin 1000 MCG tablet  Take 100 mcg by mouth daily.     estradiol 0.1 mg/24hr patch  Commonly known as:  CLIMARA - Dosed in mg/24 hr  Place 1 patch onto the skin once a week. Places on Saturdays.     methocarbamol 750 MG tablet  Commonly known as:  ROBAXIN  Take 750 mg by mouth 2 (two) times daily.     nabumetone 500 MG tablet  Commonly known as:  RELAFEN  Take 1,000 mg by mouth 2 (two) times daily.     nortriptyline 25 MG capsule  Commonly known as:  PAMELOR  Take 25 mg by mouth at bedtime.     OVER THE COUNTER MEDICATION  Take 1 capsule by mouth daily. ( MEGA RED )     PRILOSEC OTC PO  Take 1 capsule by mouth daily.     propranolol 10 MG tablet  Commonly known as:  INDERAL  Take 10 mg by mouth 2 (two) times daily.     pyridOXINE 100 MG tablet  Commonly known as:  VITAMIN B-6  Take 100 mg by mouth daily.     simvastatin 40 MG tablet  Commonly known as:  ZOCOR  Take 40 mg by mouth at bedtime.     Vitamin D3 2000 UNITS Tabs  Take 1 tablet by mouth daily.     vitamin E 400 UNIT capsule  Take 400 Units by mouth daily.     warfarin 4 MG tablet  Commonly known as:  COUMADIN  Take 4 mg by mouth daily at 6 PM.         DATA REVIEWED    Laboratory  Studies: 12/18/13- WBC 6.3, Hemoglobin 9.7, Hematocrit 29.5, Platelets 676, BUN 12, Creatinine 1.05, Sodium 139, Potassium 4.9, Chloride 98, Calcium 9.3     Past Medical History  Diagnosis Date  . Hypertension   . Hyperlipidemia   . 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 Knee surgery    . Abdominal hysterectomy    . Arm surgery    . Hemorroidectomy     Review of Systems  Constitutional: Negative.   Respiratory: Negative.   Cardiovascular: Negative.   Gastrointestinal: Negative.   Genitourinary: Negative.   Musculoskeletal: Positive for joint pain and myalgias.       R TKR  Skin: Negative.   Neurological: Negative.      Physical Exam Filed Vitals:   12/18/13 1557  BP: 136/76  Pulse: 97  Temp: 98.6 F (37  C)  Resp: 16   There is no weight on file to calculate BMI. Physical Exam  Cardiovascular: Normal rate and regular rhythm.   Pulmonary/Chest: Effort normal and breath sounds normal.  Abdominal: Soft. Bowel sounds are normal.  Musculoskeletal:       Right knee: She exhibits decreased range of motion and swelling.  DME-walker  Skin: Skin is warm and dry.       ASSESSMENT/PLAN  D/C to home with [facility] for medication management and PT/INR collection PT for gait training and balance OT for ADL training PT/INR in one week  Pt scheduled for f/u with orthopedist 12/22 Prescription provided for 30 days Education provided on medication safety and mobility Pt agrees with treatment plan

## 2013-12-18 NOTE — Progress Notes (Signed)
HISTORY & PHYSICAL  DATE: 12/14/2013   FACILITY: [facility]  LEVEL OF CARE: SNF (31)  ALLERGIES:  No Known Allergies  CHIEF COMPLAINT:  Manage right knee osteoarthritis, acute blood loss anemia and hypertension  HISTORY OF PRESENT ILLNESS: The patient is a 72 year old Caucasian female.  KNEE OSTEOARTHRITIS: Patient had a history of pain and functional disability in the knee due to end-stage osteoarthritis and has failed nonsurgical conservative treatments. Patient had worsening of pain with activity and weight bearing, pain that interfered with activities of daily living & pain with passive range of motion. Therefore patient underwent total knee arthroplasty and tolerated the procedure well. Patient is admitted to this facility for sort short-term rehabilitation. Patient denies knee pain. Right knee x-ray showed end-stage osteoarthritis.  ANEMIA: The anemia has been stable. The patient denies fatigue, melena or hematochezia. Postoperatively patient suffered acute blood loss. Last hemoglobins of 9.8, 9.9. Patient is not on iron.  HTN: Pt 's HTN remains stable.  Denies CP, sob, DOE, headaches, dizziness or visual disturbances.  No complications from the medications currently being used.  Last BP : 118/68.  PAST MEDICAL HISTORY :  Past Medical History  Diagnosis Date  . Hypertension   . Hyperlipidemia   . 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 Depression     PAST SURGICAL HISTORY: Past Surgical History  Procedure Laterality Date  . Knee surgery    . Abdominal hysterectomy    . Arm surgery    . Hemorroidectomy      SOCIAL HISTORY:  reports that she has never smoked. She has never used smokeless tobacco. She reports that she does not drink alcohol or use illicit drugs.  FAMILY HISTORY:  Family History  Problem Relation Age of Onset  . Heart disease Mother 72  . Heart disease Brother 71    CURRENT MEDICATIONS: Reviewed per [facility]  REVIEW OF SYSTEMS:   See HPI otherwise 14 point ROS is negative.  PHYSICAL EXAMINATION  VS:  T 97.6       P 78       RR 18       BP 118/68       POX%  97      WT (Lb) 194  GENERAL: no acute distress, moderately obese body habitus EYES: conjunctivae normal, sclerae normal, normal eye lids MOUTH/THROAT: lips without lesions,no lesions in the mouth,tongue is without lesions,uvula elevates in midline NECK: supple, trachea midline, no neck masses, no thyroid tenderness, no thyromegaly LYMPHATICS: no LAN in the neck, no supraclavicular LAN RESPIRATORY: breathing is even & unlabored, BS CTAB CARDIAC: RRR, no murmur,no extra heart sounds, right lower extremity +3 edema and left lower extremity +1 edema GI:  ABDOMEN: abdomen soft, normal BS, no masses, no tenderness  LIVER/SPLEEN: no hepatomegaly, no splenomegaly MUSCULOSKELETAL: HEAD: normal to inspection & palpation BACK: no kyphosis, scoliosis or spinal processes tenderness EXTREMITIES: LEFT UPPER EXTREMITY: full range of motion, normal strength & tone RIGHT UPPER EXTREMITY:  full range of motion, normal strength & tone LEFT LOWER EXTREMITY:  full range of motion, normal strength & tone RIGHT LOWER EXTREMITY:  range of motion not tested due to surgery, normal strength & tone PSYCHIATRIC: the patient is alert & oriented to person, affect & behavior appropriate  LABS/RADIOLOGY:  Hemoglobin 9.8, MCV 89.9 otherwise CBC normal, INR 2.12, PT 23.1, glucose 150 otherwise BMP normal, MRSA by PCR negative, staph aureus by PCR negative  ASSESSMENT/PLAN:  Right knee osteoarthritis-status post right total knee  arthroplasty. Continue rehabilitation. Acute blood loss anemia-recheck Hypertension-well-controlled GERD-stable Depression-denies ongoing symptoms Check CBC and BMP  I have reviewed patient's medical records received at admission/from hospitalization.  CPT CODE: 75643

## 2014-04-06 DIAGNOSIS — M25569 Pain in unspecified knee: Secondary | ICD-10-CM | POA: Insufficient documentation

## 2015-07-09 DIAGNOSIS — M25559 Pain in unspecified hip: Secondary | ICD-10-CM | POA: Insufficient documentation

## 2015-07-09 DIAGNOSIS — F5101 Primary insomnia: Secondary | ICD-10-CM | POA: Insufficient documentation

## 2015-07-17 DIAGNOSIS — R55 Syncope and collapse: Secondary | ICD-10-CM | POA: Insufficient documentation

## 2015-10-15 ENCOUNTER — Inpatient Hospital Stay (HOSPITAL_COMMUNITY)
Admission: EM | Admit: 2015-10-15 | Discharge: 2015-10-17 | DRG: 041 | Disposition: A | Discharge status: Home / Self Care | Payer: Medicare HMO | Attending: Internal Medicine | Admitting: Internal Medicine

## 2015-10-15 ENCOUNTER — Inpatient Hospital Stay (HOSPITAL_COMMUNITY): Payer: Medicare HMO

## 2015-10-15 ENCOUNTER — Encounter (HOSPITAL_COMMUNITY): Payer: Self-pay

## 2015-10-15 ENCOUNTER — Emergency Department (HOSPITAL_COMMUNITY): Payer: Medicare HMO

## 2015-10-15 DIAGNOSIS — I63412 Cerebral infarction due to embolism of left middle cerebral artery: Secondary | ICD-10-CM | POA: Diagnosis present

## 2015-10-15 DIAGNOSIS — Z7901 Long term (current) use of anticoagulants: Secondary | ICD-10-CM

## 2015-10-15 DIAGNOSIS — R609 Edema, unspecified: Secondary | ICD-10-CM

## 2015-10-15 DIAGNOSIS — N183 Chronic kidney disease, stage 3 (moderate): Secondary | ICD-10-CM | POA: Diagnosis present

## 2015-10-15 DIAGNOSIS — I63512 Cerebral infarction due to unspecified occlusion or stenosis of left middle cerebral artery: Secondary | ICD-10-CM | POA: Diagnosis not present

## 2015-10-15 DIAGNOSIS — R1319 Other dysphagia: Secondary | ICD-10-CM | POA: Diagnosis present

## 2015-10-15 DIAGNOSIS — F329 Major depressive disorder, single episode, unspecified: Secondary | ICD-10-CM | POA: Diagnosis present

## 2015-10-15 DIAGNOSIS — I639 Cerebral infarction, unspecified: Secondary | ICD-10-CM | POA: Insufficient documentation

## 2015-10-15 DIAGNOSIS — Z79899 Other long term (current) drug therapy: Secondary | ICD-10-CM

## 2015-10-15 DIAGNOSIS — R4701 Aphasia: Secondary | ICD-10-CM | POA: Diagnosis present

## 2015-10-15 DIAGNOSIS — Z90710 Acquired absence of both cervix and uterus: Secondary | ICD-10-CM | POA: Diagnosis not present

## 2015-10-15 DIAGNOSIS — Z8550 Personal history of malignant neoplasm of unspecified urinary tract organ: Secondary | ICD-10-CM | POA: Diagnosis not present

## 2015-10-15 DIAGNOSIS — E785 Hyperlipidemia, unspecified: Secondary | ICD-10-CM | POA: Diagnosis present

## 2015-10-15 DIAGNOSIS — I63132 Cerebral infarction due to embolism of left carotid artery: Secondary | ICD-10-CM | POA: Diagnosis not present

## 2015-10-15 DIAGNOSIS — I10 Essential (primary) hypertension: Secondary | ICD-10-CM | POA: Diagnosis present

## 2015-10-15 DIAGNOSIS — G8191 Hemiplegia, unspecified affecting right dominant side: Secondary | ICD-10-CM | POA: Diagnosis present

## 2015-10-15 DIAGNOSIS — Z7982 Long term (current) use of aspirin: Secondary | ICD-10-CM

## 2015-10-15 DIAGNOSIS — I63419 Cerebral infarction due to embolism of unspecified middle cerebral artery: Secondary | ICD-10-CM

## 2015-10-15 DIAGNOSIS — I253 Aneurysm of heart: Secondary | ICD-10-CM | POA: Diagnosis present

## 2015-10-15 DIAGNOSIS — I6340 Cerebral infarction due to embolism of unspecified cerebral artery: Secondary | ICD-10-CM | POA: Insufficient documentation

## 2015-10-15 DIAGNOSIS — I129 Hypertensive chronic kidney disease with stage 1 through stage 4 chronic kidney disease, or unspecified chronic kidney disease: Secondary | ICD-10-CM | POA: Diagnosis present

## 2015-10-15 DIAGNOSIS — Q211 Atrial septal defect: Secondary | ICD-10-CM

## 2015-10-15 LAB — CBC
HEMATOCRIT: 37.4 % (ref 36.0–46.0)
HEMOGLOBIN: 12.5 g/dL (ref 12.0–15.0)
MCH: 30.9 pg (ref 26.0–34.0)
MCHC: 33.4 g/dL (ref 30.0–36.0)
MCV: 92.3 fL (ref 78.0–100.0)
Platelets: 266 10*3/uL (ref 150–400)
RBC: 4.05 MIL/uL (ref 3.87–5.11)
RDW: 13.1 % (ref 11.5–15.5)
WBC: 4.2 10*3/uL (ref 4.0–10.5)

## 2015-10-15 LAB — DIFFERENTIAL
Basophils Absolute: 0 10*3/uL (ref 0.0–0.1)
Basophils Relative: 1 %
EOS PCT: 5 %
Eosinophils Absolute: 0.2 10*3/uL (ref 0.0–0.7)
LYMPHS ABS: 1.2 10*3/uL (ref 0.7–4.0)
LYMPHS PCT: 28 %
MONO ABS: 0.3 10*3/uL (ref 0.1–1.0)
MONOS PCT: 6 %
Neutro Abs: 2.5 10*3/uL (ref 1.7–7.7)
Neutrophils Relative %: 60 %

## 2015-10-15 LAB — COMPREHENSIVE METABOLIC PANEL
ALBUMIN: 3.8 g/dL (ref 3.5–5.0)
ALK PHOS: 51 U/L (ref 38–126)
ALT: 16 U/L (ref 14–54)
ANION GAP: 8 (ref 5–15)
AST: 23 U/L (ref 15–41)
BILIRUBIN TOTAL: 0.7 mg/dL (ref 0.3–1.2)
BUN: 13 mg/dL (ref 6–20)
CALCIUM: 9.4 mg/dL (ref 8.9–10.3)
CO2: 22 mmol/L (ref 22–32)
Chloride: 110 mmol/L (ref 101–111)
Creatinine, Ser: 1.34 mg/dL — ABNORMAL HIGH (ref 0.44–1.00)
GFR, EST AFRICAN AMERICAN: 44 mL/min — AB (ref >60)
GFR, EST NON AFRICAN AMERICAN: 38 mL/min — AB (ref >60)
GLUCOSE: 106 mg/dL — AB (ref 65–99)
Potassium: 3.8 mmol/L (ref 3.5–5.1)
Sodium: 140 mmol/L (ref 135–145)
TOTAL PROTEIN: 6.2 g/dL — AB (ref 6.5–8.1)

## 2015-10-15 LAB — PROTIME-INR
INR: 1.04 (ref 0.00–1.49)
Prothrombin Time: 13.8 seconds (ref 11.6–15.2)

## 2015-10-15 LAB — APTT: APTT: 28 s (ref 24–37)

## 2015-10-15 LAB — I-STAT TROPONIN, ED: TROPONIN I, POC: 0 ng/mL (ref 0.00–0.08)

## 2015-10-15 MED ORDER — METHOCARBAMOL 750 MG PO TABS
750.0000 mg | ORAL_TABLET | Freq: Two times a day (BID) | ORAL | Status: DC
  Administered 2015-10-16 – 2015-10-17 (×3): 750 mg via ORAL
  Filled 2015-10-15 (×4): qty 1

## 2015-10-15 MED ORDER — VITAMIN D 1000 UNITS PO TABS
2000.0000 [IU] | ORAL_TABLET | Freq: Every day | ORAL | Status: DC
  Administered 2015-10-16 – 2015-10-17 (×2): 2000 [IU] via ORAL
  Filled 2015-10-15 (×2): qty 2

## 2015-10-15 MED ORDER — SIMVASTATIN 40 MG PO TABS
40.0000 mg | ORAL_TABLET | Freq: Every day | ORAL | Status: DC
  Administered 2015-10-16: 40 mg via ORAL
  Filled 2015-10-15 (×2): qty 1

## 2015-10-15 MED ORDER — LORAZEPAM 2 MG/ML IJ SOLN
0.5000 mg | Freq: Once | INTRAMUSCULAR | Status: AC
  Administered 2015-10-15: 0.5 mg via INTRAVENOUS
  Filled 2015-10-15: qty 1

## 2015-10-15 MED ORDER — ASPIRIN 325 MG PO TABS
325.0000 mg | ORAL_TABLET | Freq: Every day | ORAL | Status: DC
  Administered 2015-10-16 – 2015-10-17 (×2): 325 mg via ORAL
  Filled 2015-10-15 (×2): qty 1

## 2015-10-15 MED ORDER — STROKE: EARLY STAGES OF RECOVERY BOOK
Freq: Once | Status: AC
  Administered 2015-10-15: 1

## 2015-10-15 MED ORDER — CITALOPRAM HYDROBROMIDE 10 MG PO TABS
20.0000 mg | ORAL_TABLET | Freq: Every day | ORAL | Status: DC
  Administered 2015-10-16 – 2015-10-17 (×2): 20 mg via ORAL
  Filled 2015-10-15 (×2): qty 2

## 2015-10-15 MED ORDER — VITAMIN D3 50 MCG (2000 UT) PO TABS: 1.0000 | ORAL_TABLET | Freq: Every day | ORAL | Status: DC

## 2015-10-15 MED ORDER — LORAZEPAM 1 MG PO TABS: 1.0000 mg | ORAL_TABLET | Freq: Once | ORAL | Status: DC

## 2015-10-15 MED ORDER — PANTOPRAZOLE SODIUM 40 MG PO TBEC
40.0000 mg | DELAYED_RELEASE_TABLET | Freq: Every day | ORAL | Status: DC
  Administered 2015-10-16 – 2015-10-17 (×2): 40 mg via ORAL
  Filled 2015-10-15: qty 1

## 2015-10-15 MED ORDER — ASPIRIN 300 MG RE SUPP: 300.0000 mg | Freq: Every day | RECTAL | Status: DC

## 2015-10-15 MED ORDER — SODIUM CHLORIDE 0.9 % IV SOLN
INTRAVENOUS | Status: DC
  Administered 2015-10-15: 75 mL/h via INTRAVENOUS

## 2015-10-15 MED ORDER — VITAMIN B-12 100 MCG PO TABS
100.0000 ug | ORAL_TABLET | Freq: Every day | ORAL | Status: DC
  Administered 2015-10-16 – 2015-10-17 (×2): 100 ug via ORAL
  Filled 2015-10-15 (×2): qty 1

## 2015-10-15 MED ORDER — PNEUMOCOCCAL VAC POLYVALENT 25 MCG/0.5ML IJ INJ: 0.5000 mL | INJECTION | INTRAMUSCULAR | Status: DC

## 2015-10-15 MED ORDER — VITAMIN B-6 100 MG PO TABS
100.0000 mg | ORAL_TABLET | Freq: Every day | ORAL | Status: DC
  Administered 2015-10-16: 100 mg via ORAL
  Filled 2015-10-15 (×2): qty 1

## 2015-10-15 MED ORDER — SENNOSIDES-DOCUSATE SODIUM 8.6-50 MG PO TABS
1.0000 | ORAL_TABLET | Freq: Every evening | ORAL | Status: DC | PRN
Start: 2015-10-15 — End: 2015-10-17

## 2015-10-15 MED ORDER — NORTRIPTYLINE HCL 25 MG PO CAPS
25.0000 mg | ORAL_CAPSULE | Freq: Every day | ORAL | Status: DC
  Administered 2015-10-16 – 2015-10-17 (×2): 25 mg via ORAL
  Filled 2015-10-15 (×3): qty 1

## 2015-10-15 MED ORDER — CALCIUM CARBONATE 600 MG PO TABS: 600.0000 mg | ORAL_TABLET | Freq: Two times a day (BID) | ORAL | Status: DC

## 2015-10-15 MED ORDER — ASPIRIN EC 81 MG PO TBEC: 81.0000 mg | DELAYED_RELEASE_TABLET | Freq: Every day | ORAL | Status: DC

## 2015-10-15 MED ORDER — CALCIUM CARBONATE 1250 (500 CA) MG PO TABS
1.0000 | ORAL_TABLET | Freq: Two times a day (BID) | ORAL | Status: DC
  Administered 2015-10-16 – 2015-10-17 (×3): 500 mg via ORAL
  Filled 2015-10-15 (×3): qty 1

## 2015-10-15 MED ORDER — VITAMIN E 180 MG (400 UNIT) PO CAPS
400.0000 [IU] | ORAL_CAPSULE | Freq: Every day | ORAL | Status: DC
  Administered 2015-10-16: 400 [IU] via ORAL
  Filled 2015-10-15 (×2): qty 1

## 2015-10-15 MED ORDER — PROPRANOLOL HCL 10 MG PO TABS
10.0000 mg | ORAL_TABLET | Freq: Two times a day (BID) | ORAL | Status: DC
  Administered 2015-10-16: 10 mg via ORAL
  Filled 2015-10-15 (×3): qty 1

## 2015-10-15 MED ORDER — ENOXAPARIN SODIUM 40 MG/0.4ML ~~LOC~~ SOLN
40.0000 mg | Freq: Every day | SUBCUTANEOUS | Status: DC
  Administered 2015-10-15 – 2015-10-16 (×2): 40 mg via SUBCUTANEOUS
  Filled 2015-10-15 (×2): qty 0.4

## 2015-10-15 NOTE — ED Notes (Signed)
Patient transported to MRI 

## 2015-10-15 NOTE — ED Notes (Signed)
ED resident at bedside.

## 2015-10-15 NOTE — ED Notes (Signed)
Pt reports around 0100 on Monday had onset of right sided weakness, aphasia and "it felt like my right arm wasn't mine." Pt reports continued symptoms with 8/10 HA. AOx4. VSS

## 2015-10-15 NOTE — ED Notes (Signed)
MRI tech called reporting pt was anxious about scan. Request ativan from ED resident.

## 2015-10-15 NOTE — ED Notes (Signed)
Neuro at bedside.

## 2015-10-15 NOTE — H&P (Signed)
[facility] History and Physical  Nicole Ruiz ZOX:[id number] DOB: 10/04/1941 DOA: 10/15/2015  Referring physician: Dr.Gaddy. PCP: Phineas Inches, MD  Specialists: None.  Chief Complaint: Difficulty speaking and right-sided weakness.  HPI: Nicole Ruiz is a 74 y.o. female with history of hypertension, hyperlipidemia was brought to the ER of the patient was having persistent difficulty talking and breathing out words since yesterday morning that is more than 36 hours. Patient noticed symptoms at 1 AM yesterday morning with associated right-sided weakness. Denies any difficulty with swallowing or any left-sided weakness or visual symptoms. Since symptoms persisted patient came to the ER. MRI of the brain shows left MCA infarct and on-call neurologist was consulted but since patient is beyond the time period for TPA patient has been admitted for further stroke workup. On my exam patient still has difficulty breathing out words and mild right-sided weakness. Patient otherwise denies any headache visual symptoms chest pain shortness of breath nausea vomiting dizziness or loss of consciousness. Patient had a history of syncope recently and had Holter monitor done by cardiologist as outpatient the results of which are not known. It was done at [facility].   Review of Systems: As presented in the history of presenting illness, rest negative.  Past Medical History  Diagnosis Date  . Hypertension   . Hyperlipidemia   . 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 Depression    Past Surgical History  Procedure Laterality Date  . Knee surgery    . Abdominal hysterectomy    . Arm surgery    . Hemorroidectomy     Social History:  reports that she has never smoked. She has never used smokeless tobacco. She reports that she does not drink alcohol or use illicit drugs. Where does patient live at home. Can patient participate in ADLs? Yes.  No Known Allergies  Family History:  Family  History  Problem Relation Age of Onset  . Heart disease Mother 27  . Heart disease Brother 32      Prior to Admission medications   Medication Sig Start Date End Date Taking? Authorizing Provider  Cholecalciferol (VITAMIN D3) 2000 UNITS TABS Take 1 tablet by mouth daily.   Yes Historical Provider, MD  nortriptyline (PAMELOR) 25 MG capsule Take 25 mg by mouth at bedtime.   Yes Historical Provider, MD  propranolol (INDERAL) 10 MG tablet Take 10 mg by mouth 2 (two) times daily.   Yes Historical Provider, MD  calcium carbonate (OS-CAL) 600 MG TABS Take 600 mg by mouth 2 (two) times daily with a meal.    Historical Provider, MD  citalopram (CELEXA) 20 MG tablet Take 20 mg by mouth daily.    Historical Provider, MD  cyanocobalamin 1000 MCG tablet Take 100 mcg by mouth daily.    Historical Provider, MD  estradiol (CLIMARA - DOSED IN MG/24 HR) 0.1 mg/24hr Place 1 patch onto the skin once a week. Places on Saturdays.    Historical Provider, MD  methocarbamol (ROBAXIN) 750 MG tablet Take 750 mg by mouth 2 (two) times daily.    Historical Provider, MD  nabumetone (RELAFEN) 500 MG tablet Take 1,000 mg by mouth 2 (two) times daily.     Historical Provider, MD  Omeprazole Magnesium (PRILOSEC OTC PO) Take 1 capsule by mouth daily.    Historical Provider, MD  OVER THE COUNTER MEDICATION Take 1 capsule by mouth daily. ( MEGA RED )    Historical Provider, MD  pyridOXINE (VITAMIN B-6) 100 MG tablet  Take 100 mg by mouth daily.    Historical Provider, MD  simvastatin (ZOCOR) 40 MG tablet Take 40 mg by mouth at bedtime.    Historical Provider, MD  vitamin E 400 UNIT capsule Take 400 Units by mouth daily.    Historical Provider, MD  warfarin (COUMADIN) 4 MG tablet Take 4 mg by mouth daily at 6 PM.    Historical Provider, MD    Physical Exam: Filed Vitals:   10/15/15 1645 10/15/15 1915 10/15/15 1940 10/15/15 1945  BP: 131/105 135/55 171/142 139/63  Pulse: 60  61 58  Resp: 14 17 15 14   SpO2: 99%  99% 98%      General:  Moderately built and nourished.  Eyes: Anicteric no pallor.  ENT: No discharge from the ears eyes nose and mouth.  Neck: No mass felt. No neck rigidity.  Cardiovascular: S1-S2 heard.  Respiratory: No rhonchi or crepitations.  Abdomen: Soft nontender bowel sounds present.  Skin: No rash.  Musculoskeletal: No edema.  Psychiatric: Appears normal.  Neurologic: Alert awake oriented to time place and person. Mild right-sided weakness. Right-sided pupil is mildly dilated. Patient still has difficulty bringing out words. No facial asymmetry. Tongue is midline.  Labs on Admission:  Basic Metabolic Panel:  Recent Labs Lab 10/15/15 1541  NA 140  K 3.8  CL 110  CO2 22  GLUCOSE 106*  BUN 13  CREATININE 1.34*  CALCIUM 9.4   Liver Function Tests:  Recent Labs Lab 10/15/15 1541  AST 23  ALT 16  ALKPHOS 51  BILITOT 0.7  PROT 6.2*  ALBUMIN 3.8   No results for input(s): LIPASE, AMYLASE in the last 168 hours. No results for input(s): AMMONIA in the last 168 hours. CBC:  Recent Labs Lab 10/15/15 1541  WBC 4.2  NEUTROABS 2.5  HGB 12.5  HCT 37.4  MCV 92.3  PLT 266   Cardiac Enzymes: No results for input(s): CKTOTAL, CKMB, CKMBINDEX, TROPONINI in the last 168 hours.  BNP (last 3 results) No results for input(s): BNP in the last 8760 hours.  ProBNP (last 3 results) No results for input(s): PROBNP in the last 8760 hours.  CBG: No results for input(s): GLUCAP in the last 168 hours.  Radiological Exams on Admission: Ct Head Wo Contrast  10/15/2015  CLINICAL DATA:  Aphasia, RIGHT-sided weakness, onset of headache on Monday morning, hypertension, hyperlipidemia EXAM: CT HEAD WITHOUT CONTRAST TECHNIQUE: Contiguous axial images were obtained from the base of the skull through the vertex without intravenous contrast. COMPARISON:  None FINDINGS: Generalized atrophy. Normal ventricular morphology. No midline shift or mass effect. Minimal small vessel  chronic ischemic changes of deep cerebral white matter. Old infarct LEFT cerebellar hemisphere. No intracranial hemorrhage, mass lesion or evidence acute infarction. No extra-axial fluid collections. Mucosal thickening throughout the ethmoid air cells, maxillary sinuses and sphenoid sinus. Small air-fluid level LEFT maxillary sinus. Mastoid air cells and middle ear cavities clear bilaterally. Osseous structures unremarkable. IMPRESSION: Small old LEFT cerebellar infarct. Atrophy with minimal small vessel chronic ischemic changes of deep cerebral white matter. No acute intracranial abnormalities. Scattered sinus disease changes as above with small air-fluid level LEFT maxillary sinus. Electronically Signed   By: Lavonia Dana M.D.   On: 10/15/2015 16:02   Mr Angiogram Head Wo Contrast  10/15/2015  CLINICAL DATA:  Aphasia and right-sided weakness beginning yesterday. Headache. EXAM: MRI HEAD WITHOUT CONTRAST MRA HEAD WITHOUT CONTRAST TECHNIQUE: Multiplanar, multiecho pulse sequences of the brain and surrounding structures were obtained without intravenous contrast. Angiographic  images of the head were obtained using MRA technique without contrast. COMPARISON:  Head CT 10/15/2015 and MRI 09/22/2010 FINDINGS: MRI HEAD FINDINGS There are multiple foci of acute cortical and subcortical infarction in the left MCA territory. The largest confluent focus measures approximately 3 cm and involves the posterior insula and parietal lobe/ posterior corona radiata. Separate smaller foci of acute infarction are present elsewhere in the left parietal and posterior left frontal lobes. There is no evidence of intracranial hemorrhage, mass, midline shift, or extra-axial fluid collection. Chronic infarcts are again seen in the left cerebellum and right corona radiata/ centrum semiovale. There is also a small, chronic medial left occipital lobe cortical infarct which is new from the prior MRI. There is mild global cerebral atrophy.  Orbits are unremarkable. There is moderate mucosal thickening in the ethmoid air cells bilaterally. Mild bilateral maxillary sinus mucosal thickening and a small amount of left maxillary sinus fluid are noted. Mastoid air cells are clear. Major intracranial vascular flow voids are preserved. MRA HEAD FINDINGS Mildly to moderately motion degraded examination despite repeating the acquisition. The visualized distal vertebral arteries are patent without stenosis and with the left being mildly dominant. Basilar artery is patent without significant stenosis. ICAs are patent with mild-to-moderate irregular narrowing bilaterally. PCAs are patent with moderate branch vessel irregularity but no significant proximal stenosis. There is a small right posterior communicating artery with an infundibulum noted at its origin. Internal carotid arteries are patent from skullbase to carotid termini without evidence of significant stenosis. M1 and A1 segments are patent without evidence of significant stenosis. There is prominent irregular attenuation of the left MCA M2 branches, greater posteriorly in the region of the acute infarct and with likely occlusion of multiple small branch vessels. There is mild-to-moderate right MCA branch vessel attenuation as well. IMPRESSION: 1. Acute left MCA territory infarct. 2. Chronic infarcts as above. 3. No large vessel intracranial occlusion. Marked attenuation of left MCA M2 branch vessels/ subtotal occlusion of left M2 trunks with occlusion of more distal branch vessels. Electronically Signed   By: Logan Bores M.D.   On: 10/15/2015 18:58   Mr Brain Wo Contrast  10/15/2015  CLINICAL DATA:  Aphasia and right-sided weakness beginning yesterday. Headache. EXAM: MRI HEAD WITHOUT CONTRAST MRA HEAD WITHOUT CONTRAST TECHNIQUE: Multiplanar, multiecho pulse sequences of the brain and surrounding structures were obtained without intravenous contrast. Angiographic images of the head were obtained  using MRA technique without contrast. COMPARISON:  Head CT 10/15/2015 and MRI 09/22/2010 FINDINGS: MRI HEAD FINDINGS There are multiple foci of acute cortical and subcortical infarction in the left MCA territory. The largest confluent focus measures approximately 3 cm and involves the posterior insula and parietal lobe/ posterior corona radiata. Separate smaller foci of acute infarction are present elsewhere in the left parietal and posterior left frontal lobes. There is no evidence of intracranial hemorrhage, mass, midline shift, or extra-axial fluid collection. Chronic infarcts are again seen in the left cerebellum and right corona radiata/ centrum semiovale. There is also a small, chronic medial left occipital lobe cortical infarct which is new from the prior MRI. There is mild global cerebral atrophy. Orbits are unremarkable. There is moderate mucosal thickening in the ethmoid air cells bilaterally. Mild bilateral maxillary sinus mucosal thickening and a small amount of left maxillary sinus fluid are noted. Mastoid air cells are clear. Major intracranial vascular flow voids are preserved. MRA HEAD FINDINGS Mildly to moderately motion degraded examination despite repeating the acquisition. The visualized distal vertebral arteries are  patent without stenosis and with the left being mildly dominant. Basilar artery is patent without significant stenosis. ICAs are patent with mild-to-moderate irregular narrowing bilaterally. PCAs are patent with moderate branch vessel irregularity but no significant proximal stenosis. There is a small right posterior communicating artery with an infundibulum noted at its origin. Internal carotid arteries are patent from skullbase to carotid termini without evidence of significant stenosis. M1 and A1 segments are patent without evidence of significant stenosis. There is prominent irregular attenuation of the left MCA M2 branches, greater posteriorly in the region of the acute infarct  and with likely occlusion of multiple small branch vessels. There is mild-to-moderate right MCA branch vessel attenuation as well. IMPRESSION: 1. Acute left MCA territory infarct. 2. Chronic infarcts as above. 3. No large vessel intracranial occlusion. Marked attenuation of left MCA M2 branch vessels/ subtotal occlusion of left M2 trunks with occlusion of more distal branch vessels. Electronically Signed   By: Logan Bores M.D.   On: 10/15/2015 18:58    EKG: Independently reviewed. Normal sinus rhythm. Low voltage.  Assessment/Plan Principal Problem:   Left middle cerebral artery stroke [facility]) Active Problems:   Hypertension   CVA (cerebral infarction)   Hyperlipidemia   Stroke (cerebrum) ([place])   1. Left MCA infarct - appreciate neurology consult. Probably embolic. Patient is on aspirin. Closely monitor in telemetry. Check 2-D echo and carotid Doppler. Neuro checks swallow evaluation. Check hemoglobin A1c and lipid panel. Physical therapy and speech therapy consults. Further recommendations per neurologist. 2. Chronic kidney disease stage III - closely monitor metabolic panel. 3. Hyperlipidemia - continue statins. 4. History of palpitations on Inderal. 5. Hypertension presently on no antihypertensives.  I have reviewed patient's old charts and labs on care everywhere.  Patient's medication list mentions Coumadin. But patient states she does not take Coumadin. I have confirmed this with patient's husband also. Patient has had a Holter monitor done as outpatient through her cardiologist earlier this year. May need to obtain results in the morning.   DVT Prophylaxis Lovenox.  Code Status: Full code.  Family Communication: Discussed patient's husband.  Disposition Plan: Admit to inpatient.    KAKRAKANDY,ARSHAD N. [facility] Pager [phone].  If 7PM-7AM, please contact night-coverage [email or web address] Password [email or web address] 10/15/2015, 10:10 PM

## 2015-10-15 NOTE — ED Provider Notes (Signed)
CSN: [id number]     Arrival date & time 10/15/15  1449 History   First MD Initiated Contact with Patient 10/15/15 1612     Chief Complaint  Patient presents with  . Aphasia  . Extremity Weakness     (Consider location/radiation/quality/duration/timing/severity/associated sxs/prior Treatment) Patient is a 74 y.o. female presenting with extremity weakness.  Extremity Weakness This is a new problem. The current episode started yesterday. The problem occurs constantly. The problem has been unchanged. Associated symptoms include vertigo and weakness.    Past Medical History  Diagnosis Date  . Hypertension   . Hyperlipidemia   . 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 Depression    Past Surgical History  Procedure Laterality Date  . Knee surgery    . Abdominal hysterectomy    . Arm surgery    . Hemorroidectomy     Family History  Problem Relation Age of Onset  . Heart disease Mother 53  . Heart disease Brother 29   Social History  Substance Use Topics  . Smoking status: Never Smoker   . Smokeless tobacco: Never Used  . Alcohol Use: No   OB History    No data available     Review of Systems  HENT: Positive for voice change.   Musculoskeletal: Positive for extremity weakness.  Neurological: Positive for vertigo, speech difficulty and weakness.  All other systems reviewed and are negative.     Allergies  Review of patient's allergies indicates no known allergies.  Home Medications   Prior to Admission medications   Medication Sig Start Date End Date Taking? Authorizing Provider  calcium carbonate (OS-CAL) 600 MG TABS Take 600 mg by mouth 2 (two) times daily with a meal.    Historical Provider, MD  Cholecalciferol (VITAMIN D3) 2000 UNITS TABS Take 1 tablet by mouth daily.    Historical Provider, MD  citalopram (CELEXA) 20 MG tablet Take 20 mg by mouth daily.    Historical Provider, MD  cyanocobalamin 1000 MCG tablet Take 100 mcg by mouth daily.     Historical Provider, MD  estradiol (CLIMARA - DOSED IN MG/24 HR) 0.1 mg/24hr Place 1 patch onto the skin once a week. Places on Saturdays.    Historical Provider, MD  methocarbamol (ROBAXIN) 750 MG tablet Take 750 mg by mouth 2 (two) times daily.    Historical Provider, MD  nabumetone (RELAFEN) 500 MG tablet Take 1,000 mg by mouth 2 (two) times daily.     Historical Provider, MD  nortriptyline (PAMELOR) 25 MG capsule Take 25 mg by mouth at bedtime.    Historical Provider, MD  Omeprazole Magnesium (PRILOSEC OTC PO) Take 1 capsule by mouth daily.    Historical Provider, MD  OVER THE COUNTER MEDICATION Take 1 capsule by mouth daily. ( MEGA RED )    Historical Provider, MD  propranolol (INDERAL) 10 MG tablet Take 10 mg by mouth 2 (two) times daily.    Historical Provider, MD  pyridOXINE (VITAMIN B-6) 100 MG tablet Take 100 mg by mouth daily.    Historical Provider, MD  simvastatin (ZOCOR) 40 MG tablet Take 40 mg by mouth at bedtime.    Historical Provider, MD  vitamin E 400 UNIT capsule Take 400 Units by mouth daily.    Historical Provider, MD  warfarin (COUMADIN) 4 MG tablet Take 4 mg by mouth daily at 6 PM.    Historical Provider, MD   BP 137/64 mmHg  Pulse 61  Resp 18  SpO2 96% Physical  Exam  Constitutional: She appears well-developed and well-nourished. No distress.  HENT:  Head: Normocephalic.  Eyes: Pupils are equal, round, and reactive to light.  Neck: Normal range of motion.  Cardiovascular: Normal rate.   Pulmonary/Chest: Effort normal.  Abdominal: Soft.  Neurological: She is alert. Coordination abnormal.  Dysarthric non fluent aphasia.    CN II-XII grossly intact.   Right hand grip weakness, 4/5 right upper extremity strength. 5/5 left upper extremity strength.  Ataxic gait.   Skin: Skin is warm and dry. She is not diaphoretic.  Psychiatric: Her behavior is normal.  Nursing note and vitals reviewed.   ED Course  Procedures (including critical care time) Labs  Review Labs Reviewed  PROTIME-INR  APTT  CBC  DIFFERENTIAL  COMPREHENSIVE METABOLIC PANEL  I-STAT [place], ED    Imaging Review Ct Head Wo Contrast  10/15/2015  CLINICAL DATA:  Aphasia, RIGHT-sided weakness, onset of headache on Monday morning, hypertension, hyperlipidemia EXAM: CT HEAD WITHOUT CONTRAST TECHNIQUE: Contiguous axial images were obtained from the base of the skull through the vertex without intravenous contrast. COMPARISON:  None FINDINGS: Generalized atrophy. Normal ventricular morphology. No midline shift or mass effect. Minimal small vessel chronic ischemic changes of deep cerebral white matter. Old infarct LEFT cerebellar hemisphere. No intracranial hemorrhage, mass lesion or evidence acute infarction. No extra-axial fluid collections. Mucosal thickening throughout the ethmoid air cells, maxillary sinuses and sphenoid sinus. Small air-fluid level LEFT maxillary sinus. Mastoid air cells and middle ear cavities clear bilaterally. Osseous structures unremarkable. IMPRESSION: Small old LEFT cerebellar infarct. Atrophy with minimal small vessel chronic ischemic changes of deep cerebral white matter. No acute intracranial abnormalities. Scattered sinus disease changes as above with small air-fluid level LEFT maxillary sinus. Electronically Signed   By: Lavonia Dana M.D.   On: 10/15/2015 16:02   I have personally reviewed and evaluated these images and lab results as part of my medical decision-making.   EKG Interpretation None      MDM   Dysarthric nonfluent aphasia that started Monday morning at 1:00 AM. Patient has refused to come to the ED at that time. She also stated she had right sided weakness.  Patient has mild headache here. CT shows old left cerebellar infarct but no history of stroke per husband.  Neurology consulted.  MRI and MRA ordered. Patient admitted to medicine for further management.   Final diagnoses:  Aphasia  Cerebral infarction due to embolism of middle  cerebral artery [facility])    Roberto Scales, MD 02/54/27 0623  Delora Fuel, MD 76/28/31 5176

## 2015-10-15 NOTE — Consult Note (Addendum)
Referring Physician: ED    Chief Complaint: dysarthria, right sided weakness  HPI:                                                                                                                                         Nicole Ruiz is an 74 y.o. female with a past medical history significant for HTN, hyperlipidemia, depression, presents to the ED for evaluation of the above stated symptoms. She reports that she went to bed Sunday night feeling well but woke around around 1 am Monday and realized that the right arm and leg were not moving well, weak, and she couldn't go anywhere without holding to things around her room. Then, she became aware of having trouble expressing herself. Overall. Her symptoms have remained unchanged.  Complains of HA, but denies vertigo, double vision, difficulty swallowing, focal weakness, or vision disturbances. CT brain was personally reviewed and showed no acute abnormality. MRI brain was just completed and reveal a moderate size infarct along the insular region, posterior frontal region, as well as smaller areas of acute infarct posterior left parietal area and CR. MRA brain: no large vessel intracranial occlusion. Marked attenuation of left MCA M2 branch vessels/ subtotal occlusion of left M2 trunks with occlusion of more distal branch vessels. Date last known well: 10/13/15 Time last known well: 9:30 pm tPA Given: no, late presentation   Past Medical History  Diagnosis Date  . Hypertension   . Hyperlipidemia   . 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 Depression     Past Surgical History  Procedure Laterality Date  . Knee surgery    . Abdominal hysterectomy    . Arm surgery    . Hemorroidectomy      Family History  Problem Relation Age of Onset  . Heart disease Mother 73  . Heart disease Brother 10   Social History:  reports that she has never smoked. She has never used smokeless tobacco. She reports that she does not drink alcohol or  use illicit drugs. Family history: no epilepsy, MS, or brain tumor Allergies: No Known Allergies  Medications:                                                                                                                           I have reviewed the patient's current medications.  ROS:  History obtained from chart review and the patient  General ROS: negative for - chills, fatigue, fever, night sweats, or weight loss Psychological ROS: negative for - behavioral disorder, hallucinations, memory difficulties, mood swings or suicidal ideation Ophthalmic ROS: negative for - blurry vision, double vision, eye pain or loss of vision ENT ROS: negative for - epistaxis, nasal discharge, oral lesions, sore throat, tinnitus or vertigo Allergy and Immunology ROS: negative for - hives or itchy/watery eyes Hematological and Lymphatic ROS: negative for - bleeding problems, bruising or swollen lymph nodes Endocrine ROS: negative for - galactorrhea, hair pattern changes, polydipsia/polyuria or temperature intolerance Respiratory ROS: negative for - cough, hemoptysis, shortness of breath or wheezing Cardiovascular ROS: negative for - chest pain, dyspnea on exertion, edema or irregular heartbeat Gastrointestinal ROS: negative for - abdominal pain, diarrhea, hematemesis, nausea/vomiting or stool incontinence Genito-Urinary ROS: negative for - dysuria, hematuria, incontinence or urinary frequency/urgency Musculoskeletal ROS: negative for - joint swelling Neurological ROS: as noted in HPI Dermatological ROS: negative for rash and skin lesion changes   Physical exam:  Constitutional: well developed, pleasant female in no apparent distress. Blood pressure 128/60, pulse 60, resp. rate 11, SpO2 100 %. Eyes: no jaundice or exophthalmos.  Head: normocephalic. Neck: supple, no  bruits, no JVD. Cardiac: no murmurs. Lungs: clear. Abdomen: soft, no tender, no mass. Extremities: no edema, clubbing, or cyanosis.  Skin: no rash Neurologic Examination:                                                                                                      General: NAD Mental Status: Alert, oriented, thought content appropriate. No dysarthria but mild expressive dysphasia.  Able to follow 3 step commands without difficulty. Cranial Nerves: II: Discs flat bilaterally; Visual fields grossly normal, pupils equal, round, reactive to light and accommodation III,IV, VI: ptosis not present, extra-ocular motions intact bilaterally V,VII: smile symmetric, facial light touch sensation normal bilaterally VIII: hearing normal bilaterally IX,X: uvula rises symmetrically XI: bilateral shoulder shrug XII: midline tongue extension without atrophy or fasciculations Motor: Mild weakness right arm-leg, decreased rapid alternating movements right hand. Tone and bulk:normal tone throughout; no atrophy noted Sensory: Pinprick and light touch diminished in the right side. Deep Tendon Reflexes:  Right: Upper Extremity   Left: Upper extremity   biceps (C-5 to C-6) 2/4   biceps (C-5 to C-6) 2/4 tricep (C7) 2/4    triceps (C7) 2/4 Brachioradialis (C6) 2/4  Brachioradialis (C6) 2/4  Lower Extremity Lower Extremity  quadriceps (L-2 to L-4) 2/4   quadriceps (L-2 to L-4) 2/4 Achilles (S1) 2/4   Achilles (S1) 2/4 Plantars: Right: downgoing   Left: downgoing Cerebellar: normal finger-to-nose,  normal heel-to-shin test Gait:  No tested due to multiple leads    Results for orders placed or performed during the hospital encounter of 10/15/15 (from the past 48 hour(s))  Protime-INR     Status: None   Collection Time: 10/15/15  3:41 PM  Result Value Ref Range   Prothrombin Time 13.8 11.6 - 15.2 seconds   INR 1.04 0.00 - 1.49  APTT  Status: None   Collection Time: 10/15/15  3:41 PM   Result Value Ref Range   aPTT 28 24 - 37 seconds  CBC     Status: None   Collection Time: 10/15/15  3:41 PM  Result Value Ref Range   WBC 4.2 4.0 - 10.5 K/uL   RBC 4.05 3.87 - 5.11 MIL/uL   Hemoglobin 12.5 12.0 - 15.0 g/dL   HCT 37.4 36.0 - 46.0 %   MCV 92.3 78.0 - 100.0 fL   MCH 30.9 26.0 - 34.0 pg   MCHC 33.4 30.0 - 36.0 g/dL   RDW 13.1 11.5 - 15.5 %   Platelets 266 150 - 400 K/uL  Differential     Status: None   Collection Time: 10/15/15  3:41 PM  Result Value Ref Range   Neutrophils Relative % 60 %   Neutro Abs 2.5 1.7 - 7.7 K/uL   Lymphocytes Relative 28 %   Lymphs Abs 1.2 0.7 - 4.0 K/uL   Monocytes Relative 6 %   Monocytes Absolute 0.3 0.1 - 1.0 K/uL   Eosinophils Relative 5 %   Eosinophils Absolute 0.2 0.0 - 0.7 K/uL   Basophils Relative 1 %   Basophils Absolute 0.0 0.0 - 0.1 K/uL  Comprehensive metabolic panel     Status: Abnormal   Collection Time: 10/15/15  3:41 PM  Result Value Ref Range   Sodium 140 135 - 145 mmol/L   Potassium 3.8 3.5 - 5.1 mmol/L   Chloride 110 101 - 111 mmol/L   CO2 22 22 - 32 mmol/L   Glucose, Bld 106 (H) 65 - 99 mg/dL   BUN 13 6 - 20 mg/dL   Creatinine, Ser 1.34 (H) 0.44 - 1.00 mg/dL   Calcium 9.4 8.9 - 10.3 mg/dL   Total Protein 6.2 (L) 6.5 - 8.1 g/dL   Albumin 3.8 3.5 - 5.0 g/dL   AST 23 15 - 41 U/L   ALT 16 14 - 54 U/L   Alkaline Phosphatase 51 38 - 126 U/L   Total Bilirubin 0.7 0.3 - 1.2 mg/dL   GFR calc non Af Amer 38 (L) >60 mL/min   GFR calc Af Amer 44 (L) >60 mL/min    Comment: (NOTE) The eGFR has been calculated using the CKD EPI equation. This calculation has not been validated in all clinical situations. eGFR's persistently <60 mL/min signify possible Chronic Kidney Disease.    Anion gap 8 5 - 15  I-stat troponin, ED (not at [facility], [facility])     Status: None   Collection Time: 10/15/15  3:51 PM  Result Value Ref Range   Troponin i, poc 0.00 0.00 - 0.08 ng/mL   Comment 3            Comment: Due to the release kinetics  of cTnI, a negative result within the first hours of the onset of symptoms does not rule out myocardial infarction with certainty. If myocardial infarction is still suspected, repeat the test at appropriate intervals.    Ct Head Wo Contrast  10/15/2015  CLINICAL DATA:  Aphasia, RIGHT-sided weakness, onset of headache on Monday morning, hypertension, hyperlipidemia EXAM: CT HEAD WITHOUT CONTRAST TECHNIQUE: Contiguous axial images were obtained from the base of the skull through the vertex without intravenous contrast. COMPARISON:  None FINDINGS: Generalized atrophy. Normal ventricular morphology. No midline shift or mass effect. Minimal small vessel chronic ischemic changes of deep cerebral white matter. Old infarct LEFT cerebellar hemisphere. No intracranial hemorrhage, mass lesion or evidence acute  infarction. No extra-axial fluid collections. Mucosal thickening throughout the ethmoid air cells, maxillary sinuses and sphenoid sinus. Small air-fluid level LEFT maxillary sinus. Mastoid air cells and middle ear cavities clear bilaterally. Osseous structures unremarkable. IMPRESSION: Small old LEFT cerebellar infarct. Atrophy with minimal small vessel chronic ischemic changes of deep cerebral white matter. No acute intracranial abnormalities. Scattered sinus disease changes as above with small air-fluid level LEFT maxillary sinus. Electronically Signed   By: Lavonia Dana M.D.   On: 10/15/2015 16:02     Assessment: 74 y.o. female comes with due to new onset dysphasia and right hemiparesis that apparently developed yesterday at 1 am. CT brain without acute abnormality, but MRI brain with evidence of acute infarction left MCA territory, likely embolic. MRA brain showed no large vessel intracranial occlusion. Marked attenuation of left MCA M2 branch vessels/ subtotal occlusion of left M2 trunks with occlusion of more distal branch vessels. Unfortunately, she was not administered tpa due to late  presentation. Admit to medicine. Complete stroke work up. Stroke team will follow up tomorrow.  Stroke Risk Factors - age, HTN, hyperlipidemia  Plan: 1. HgbA1c, fasting lipid panel 2. MRI, MRA  of the brain without contrast 3. Echocardiogram 4. Carotid dopplers 5. Prophylactic therapy-aspirin pending results stroke work up 6. Risk factor modification 7. Telemetry monitoring 8. Frequent neuro checks 9. PT/OT SLP 10. NPO  Dorian Pod, MD [facility] [phone]  10/15/2015, 5:04 PM

## 2015-10-16 ENCOUNTER — Inpatient Hospital Stay (HOSPITAL_COMMUNITY): Payer: Medicare HMO

## 2015-10-16 DIAGNOSIS — I639 Cerebral infarction, unspecified: Secondary | ICD-10-CM

## 2015-10-16 DIAGNOSIS — I63132 Cerebral infarction due to embolism of left carotid artery: Secondary | ICD-10-CM

## 2015-10-16 LAB — COMPREHENSIVE METABOLIC PANEL
ALK PHOS: 44 U/L (ref 38–126)
ALT: 13 U/L — AB (ref 14–54)
AST: 19 U/L (ref 15–41)
Albumin: 3.4 g/dL — ABNORMAL LOW (ref 3.5–5.0)
Anion gap: 9 (ref 5–15)
BUN: 14 mg/dL (ref 6–20)
CALCIUM: 8.7 mg/dL — AB (ref 8.9–10.3)
CHLORIDE: 107 mmol/L (ref 101–111)
CO2: 23 mmol/L (ref 22–32)
CREATININE: 1.15 mg/dL — AB (ref 0.44–1.00)
GFR calc non Af Amer: 46 mL/min — ABNORMAL LOW (ref >60)
GFR, EST AFRICAN AMERICAN: 53 mL/min — AB (ref >60)
GLUCOSE: 76 mg/dL (ref 65–99)
Potassium: 3.9 mmol/L (ref 3.5–5.1)
SODIUM: 139 mmol/L (ref 135–145)
Total Bilirubin: 0.6 mg/dL (ref 0.3–1.2)
Total Protein: 5.6 g/dL — ABNORMAL LOW (ref 6.5–8.1)

## 2015-10-16 LAB — LIPID PANEL
CHOLESTEROL: 153 mg/dL (ref 0–200)
HDL: 55 mg/dL (ref >40)
LDL Cholesterol: 85 mg/dL (ref 0–99)
Total CHOL/HDL Ratio: 2.8 RATIO
Triglycerides: 67 mg/dL (ref <150)
VLDL: 13 mg/dL (ref 0–40)

## 2015-10-16 LAB — CBC
HCT: 34.8 % — ABNORMAL LOW (ref 36.0–46.0)
Hemoglobin: 11.9 g/dL — ABNORMAL LOW (ref 12.0–15.0)
MCH: 31.2 pg (ref 26.0–34.0)
MCHC: 34.2 g/dL (ref 30.0–36.0)
MCV: 91.3 fL (ref 78.0–100.0)
PLATELETS: 236 10*3/uL (ref 150–400)
RBC: 3.81 MIL/uL — AB (ref 3.87–5.11)
RDW: 13.3 % (ref 11.5–15.5)
WBC: 5.1 10*3/uL (ref 4.0–10.5)

## 2015-10-16 LAB — HEMOGLOBIN A1C
HEMOGLOBIN A1C: 5.8 % — AB (ref 4.8–5.6)
MEAN PLASMA GLUCOSE: 120 mg/dL

## 2015-10-16 MED ORDER — ENSURE ENLIVE PO LIQD
237.0000 mL | Freq: Every day | ORAL | Status: DC | PRN
  Filled 2015-10-16: qty 237

## 2015-10-16 MED ORDER — ACETAMINOPHEN 325 MG PO TABS
650.0000 mg | ORAL_TABLET | ORAL | Status: DC | PRN
  Administered 2015-10-16: 650 mg via ORAL
  Filled 2015-10-16: qty 2

## 2015-10-16 MED ORDER — ENSURE ENLIVE PO LIQD
237.0000 mL | Freq: Once | ORAL | Status: AC
  Administered 2015-10-16: 237 mL via ORAL
  Filled 2015-10-16: qty 237

## 2015-10-16 MED ORDER — PROPRANOLOL HCL 10 MG PO TABS: 10.0000 mg | ORAL_TABLET | Freq: Two times a day (BID) | ORAL | Status: DC

## 2015-10-16 MED ORDER — PROPRANOLOL HCL 10 MG PO TABS
10.0000 mg | ORAL_TABLET | Freq: Two times a day (BID) | ORAL | Status: DC
  Administered 2015-10-16 – 2015-10-17 (×2): 10 mg via ORAL
  Filled 2015-10-16 (×3): qty 1

## 2015-10-16 NOTE — Progress Notes (Signed)
Pt arrived on unit 2230 hrs, A&O, no obvious distress, NIHSS 4, admission orders implemented, Pt oriented to room and equipment.

## 2015-10-16 NOTE — Progress Notes (Signed)
VASCULAR LAB PRELIMINARY  PRELIMINARY  PRELIMINARY  PRELIMINARY  Carotid duplex  completed.    Preliminary report:  Bilateral:  1-39% ICA stenosis.  Vertebral artery flow is antegrade.      Darrielle Pflieger, RVT 10/16/2015, 11:35 AM

## 2015-10-16 NOTE — Care Management Note (Signed)
Case Management Note  Patient Details  Name: Nicole Ruiz MRN: [id number] Date of Birth: [birth year]  Subjective/Objective:                    Action/Plan: Patient was admitted with CVA. Lives at home with spouse. Will follow for discharge needs pending PT/OT evals and physician orders.  Expected Discharge Date:                  Expected Discharge Plan:  [place]  In-House Referral:     Discharge planning Services     Post Acute Care Choice:    Choice offered to:     DME Arranged:    DME Agency:     HH Arranged:    [place] Agency:     Status of Service:  In process, will continue to follow  Medicare Important Message Given:    Date Medicare IM Given:    Medicare IM give by:    Date Additional Medicare IM Given:    Additional Medicare Important Message give by:     If discussed at [place] of Stay Meetings, dates discussed:    Additional Comments:  Rolm Baptise, RN 10/16/2015, 2:30 PM

## 2015-10-16 NOTE — Progress Notes (Signed)
[facility] PROGRESS NOTE  Nicole Ruiz GQB:[id number] DOB: [birth year] DOA: 10/15/2015 PCP: Phineas Inches, MD  Assessment/Plan:   Left MCA infarct:still with some expressive aphasia and mild right hemiparesis. CVA likelyembolic. Tele neg for Afib. Carotid doppler neg for significant stenosis. Echo pending. TEE and Loop recorder planned for tomorrow.LDL 85-not at goal (goal <70), A1C 5.8. Continue ASA and statin. PT recommending HHPT.  Dysphagia:secondary to above. Seen SLP-recommendations are for Dys 3 diet. Will speak with patient/family and make them aware of aspiration risks.  Chronic kidney disease stage III - closely monitor metabolic panel.  Hyperlipidemia - continue statins.  History of palpitations- on Inderal-continue telemetry monitoring.  Hypertension- allow some permissive hypertension, start anti-hypertensive in the next few days  Code Status: Full Family Communication: none at bedside Disposition Plan:  SNF when workup complete   Consultants:  Neurololgy  Procedures:  none  Antibiotics:  none  HPI/Subjective: Nicole Ruiz is a 74 y.o. Female with a history of hypertension, hyperlipidemia admitted through the ER on 10/18 for difficulty talking, right sided weakness for more than 36 hours, denied difficulty with swallowing, visual problems and any left sided weakness. MRI of the brain shows left MCA infarct, patient was beyond tPA window, patient admitted for further stoke workup. On exam patient still has difficulty breathing out words and mild right sided weakness, patient reports feeling better. Patient denies nausea, vomiting, headache , shortness of breath and loss of consciousness.   Objective: Filed Vitals:   10/16/15 0904  BP: 123/63  Pulse: 66  Temp: 97.3 F (36.3 C)  Resp: 16    Intake/Output Summary (Last 24 hours) at 10/16/15 1047 Last data filed at 10/16/15 0854  Gross per 24 hour  Intake    120 ml  Output      0 ml  Net     120 ml   Filed Weights   10/15/15 2300  Weight: 70.171 kg (154 lb 11.2 oz)    Exam:   General: Moderately built and nourished.  Eyes: Anicteric no pallor.  ENT: No discharge from the ears eyes nose and mouth.       Cardiovascular: S1-S2 heard.  Respiratory: No rhonchi or crepitations.  Abdomen: Soft nontender bowel sounds present.  Skin: No rash.  Musculoskeletal: No edema.  Psychiatric: Appears normal.  Neurologic: Alert awake oriented to time place and person. Mild right-sided weakness. Patient still has difficulty bringing out words. No facial asymmetry. Tongue is midline.    Data Reviewed: Basic Metabolic Panel:  Recent Labs Lab 10/15/15 1541 10/16/15 0515  NA 140 139  K 3.8 3.9  CL 110 107  CO2 22 23  GLUCOSE 106* 76  BUN 13 14  CREATININE 1.34* 1.15*  CALCIUM 9.4 8.7*   Liver Function Tests:  Recent Labs Lab 10/15/15 1541 10/16/15 0515  AST 23 19  ALT 16 13*  ALKPHOS 51 44  BILITOT 0.7 0.6  PROT 6.2* 5.6*  ALBUMIN 3.8 3.4*   No results for input(s): LIPASE, AMYLASE in the last 168 hours. No results for input(s): AMMONIA in the last 168 hours. CBC:  Recent Labs Lab 10/15/15 1541 10/16/15 0515  WBC 4.2 5.1  NEUTROABS 2.5  --   HGB 12.5 11.9*  HCT 37.4 34.8*  MCV 92.3 91.3  PLT 266 236   Cardiac Enzymes: No results for input(s): CKTOTAL, CKMB, CKMBINDEX, TROPONINI in the last 168 hours. BNP (last 3 results) No results for input(s): BNP in the last 8760 hours.  ProBNP (last 3  results) No results for input(s): PROBNP in the last 8760 hours.  CBG: No results for input(s): GLUCAP in the last 168 hours.  No results found for this or any previous visit (from the past 240 hour(s)).   Studies: Dg Chest 2 View  10/15/2015  CLINICAL DATA:  Acute onset of left-sided weakness. CVA. Initial encounter. EXAM: CHEST  2 VIEW COMPARISON:  Chest radiograph performed 05/28/2012 FINDINGS: The lungs are well-aerated. There is elevation of the  right hemidiaphragm. There is no evidence of focal opacification, pleural effusion or pneumothorax. The heart is normal in size; the mediastinal contour is within normal limits. No acute osseous abnormalities are seen. IMPRESSION: Elevation of the right hemidiaphragm.  Lungs remain grossly clear. Electronically Signed   By: Garald Balding M.D.   On: 10/15/2015 23:09   Ct Head Wo Contrast  10/15/2015  CLINICAL DATA:  Aphasia, RIGHT-sided weakness, onset of headache on Monday morning, hypertension, hyperlipidemia EXAM: CT HEAD WITHOUT CONTRAST TECHNIQUE: Contiguous axial images were obtained from the base of the skull through the vertex without intravenous contrast. COMPARISON:  None FINDINGS: Generalized atrophy. Normal ventricular morphology. No midline shift or mass effect. Minimal small vessel chronic ischemic changes of deep cerebral white matter. Old infarct LEFT cerebellar hemisphere. No intracranial hemorrhage, mass lesion or evidence acute infarction. No extra-axial fluid collections. Mucosal thickening throughout the ethmoid air cells, maxillary sinuses and sphenoid sinus. Small air-fluid level LEFT maxillary sinus. Mastoid air cells and middle ear cavities clear bilaterally. Osseous structures unremarkable. IMPRESSION: Small old LEFT cerebellar infarct. Atrophy with minimal small vessel chronic ischemic changes of deep cerebral white matter. No acute intracranial abnormalities. Scattered sinus disease changes as above with small air-fluid level LEFT maxillary sinus. Electronically Signed   By: Lavonia Dana M.D.   On: 10/15/2015 16:02   Mr Angiogram Head Wo Contrast  10/15/2015  CLINICAL DATA:  Aphasia and right-sided weakness beginning yesterday. Headache. EXAM: MRI HEAD WITHOUT CONTRAST MRA HEAD WITHOUT CONTRAST TECHNIQUE: Multiplanar, multiecho pulse sequences of the brain and surrounding structures were obtained without intravenous contrast. Angiographic images of the head were obtained using MRA  technique without contrast. COMPARISON:  Head CT 10/15/2015 and MRI 09/22/2010 FINDINGS: MRI HEAD FINDINGS There are multiple foci of acute cortical and subcortical infarction in the left MCA territory. The largest confluent focus measures approximately 3 cm and involves the posterior insula and parietal lobe/ posterior corona radiata. Separate smaller foci of acute infarction are present elsewhere in the left parietal and posterior left frontal lobes. There is no evidence of intracranial hemorrhage, mass, midline shift, or extra-axial fluid collection. Chronic infarcts are again seen in the left cerebellum and right corona radiata/ centrum semiovale. There is also a small, chronic medial left occipital lobe cortical infarct which is new from the prior MRI. There is mild global cerebral atrophy. Orbits are unremarkable. There is moderate mucosal thickening in the ethmoid air cells bilaterally. Mild bilateral maxillary sinus mucosal thickening and a small amount of left maxillary sinus fluid are noted. Mastoid air cells are clear. Major intracranial vascular flow voids are preserved. MRA HEAD FINDINGS Mildly to moderately motion degraded examination despite repeating the acquisition. The visualized distal vertebral arteries are patent without stenosis and with the left being mildly dominant. Basilar artery is patent without significant stenosis. ICAs are patent with mild-to-moderate irregular narrowing bilaterally. PCAs are patent with moderate branch vessel irregularity but no significant proximal stenosis. There is a small right posterior communicating artery with an infundibulum noted at its origin. Internal  carotid arteries are patent from skullbase to carotid termini without evidence of significant stenosis. M1 and A1 segments are patent without evidence of significant stenosis. There is prominent irregular attenuation of the left MCA M2 branches, greater posteriorly in the region of the acute infarct and with  likely occlusion of multiple small branch vessels. There is mild-to-moderate right MCA branch vessel attenuation as well. IMPRESSION: 1. Acute left MCA territory infarct. 2. Chronic infarcts as above. 3. No large vessel intracranial occlusion. Marked attenuation of left MCA M2 branch vessels/ subtotal occlusion of left M2 trunks with occlusion of more distal branch vessels. Electronically Signed   By: Logan Bores M.D.   On: 10/15/2015 18:58   Mr Brain Wo Contrast  10/15/2015  CLINICAL DATA:  Aphasia and right-sided weakness beginning yesterday. Headache. EXAM: MRI HEAD WITHOUT CONTRAST MRA HEAD WITHOUT CONTRAST TECHNIQUE: Multiplanar, multiecho pulse sequences of the brain and surrounding structures were obtained without intravenous contrast. Angiographic images of the head were obtained using MRA technique without contrast. COMPARISON:  Head CT 10/15/2015 and MRI 09/22/2010 FINDINGS: MRI HEAD FINDINGS There are multiple foci of acute cortical and subcortical infarction in the left MCA territory. The largest confluent focus measures approximately 3 cm and involves the posterior insula and parietal lobe/ posterior corona radiata. Separate smaller foci of acute infarction are present elsewhere in the left parietal and posterior left frontal lobes. There is no evidence of intracranial hemorrhage, mass, midline shift, or extra-axial fluid collection. Chronic infarcts are again seen in the left cerebellum and right corona radiata/ centrum semiovale. There is also a small, chronic medial left occipital lobe cortical infarct which is new from the prior MRI. There is mild global cerebral atrophy. Orbits are unremarkable. There is moderate mucosal thickening in the ethmoid air cells bilaterally. Mild bilateral maxillary sinus mucosal thickening and a small amount of left maxillary sinus fluid are noted. Mastoid air cells are clear. Major intracranial vascular flow voids are preserved. MRA HEAD FINDINGS Mildly to  moderately motion degraded examination despite repeating the acquisition. The visualized distal vertebral arteries are patent without stenosis and with the left being mildly dominant. Basilar artery is patent without significant stenosis. ICAs are patent with mild-to-moderate irregular narrowing bilaterally. PCAs are patent with moderate branch vessel irregularity but no significant proximal stenosis. There is a small right posterior communicating artery with an infundibulum noted at its origin. Internal carotid arteries are patent from skullbase to carotid termini without evidence of significant stenosis. M1 and A1 segments are patent without evidence of significant stenosis. There is prominent irregular attenuation of the left MCA M2 branches, greater posteriorly in the region of the acute infarct and with likely occlusion of multiple small branch vessels. There is mild-to-moderate right MCA branch vessel attenuation as well. IMPRESSION: 1. Acute left MCA territory infarct. 2. Chronic infarcts as above. 3. No large vessel intracranial occlusion. Marked attenuation of left MCA M2 branch vessels/ subtotal occlusion of left M2 trunks with occlusion of more distal branch vessels. Electronically Signed   By: Logan Bores M.D.   On: 10/15/2015 18:58    Scheduled Meds: . aspirin  300 mg Rectal Daily   Or  . aspirin  325 mg Oral Daily  . calcium carbonate  1 tablet Oral BID WC  . cholecalciferol  2,000 Units Oral Daily  . citalopram  20 mg Oral Daily  . enoxaparin (LOVENOX) injection  40 mg Subcutaneous QHS  . methocarbamol  750 mg Oral BID  . nortriptyline  25 mg Oral  QHS  . pantoprazole  40 mg Oral Daily  . pneumococcal 23 valent vaccine  0.5 mL Intramuscular Tomorrow-1000  . propranolol  10 mg Oral BID  . pyridOXINE  100 mg Oral Daily  . simvastatin  40 mg Oral QHS  . cyanocobalamin  100 mcg Oral Daily  . vitamin E  400 Units Oral Daily   Continuous Infusions: . sodium chloride 75 mL/hr (10/15/15  2308)    Principal Problem:   Left middle cerebral artery stroke [facility]) Active Problems:   Hypertension   CVA (cerebral infarction)   Hyperlipidemia   Stroke (cerebrum) ([place])   Time spent: 60 minutes  Lawernce Keas  PA-S [facility]  If 7PM-7AM, please contact night-coverage at [email or web address], password [facility] 10/16/2015, 10:47 AM  LOS: 1 day    Attending MD note  Patient was seen, examined,treatment plan was discussed with the PA-S.  I have personally reviewed the clinical findings, lab, imaging studies and management of this patient in detail. I agree with the documentation, as recorded by the PA-S.   74 year old female with history of dyslipidemia and hypertension-brought in for expressive aphasia and right hemiparesis. Found to have CVA-likely embolic in the MCA territory. Workup so far negative for any embolic source, telemetry negative for A. fib. Continue aspirin-spoke with neurology-recommendations are to pursue a TEE-and if needed a loop recorder. Rest as documented above.  [facility]

## 2015-10-16 NOTE — Progress Notes (Signed)
STROKE TEAM PROGRESS NOTE   HISTORY Nicole Ruiz is an 74 y.o. female with a past medical history significant for HTN, hyperlipidemia, depression, presents to the ED for evaluation of dysarthria, right sided weakness. Nicole Ruiz reports that Nicole Ruiz went to bed Sunday night feeling well but woke around around 1 am Monday and realized that the right arm and leg were not moving well, weak, and Nicole Ruiz couldn't go anywhere without holding to things around her room. Then, Nicole Ruiz became aware of having trouble expressing herself. Overall. Her symptoms have remained unchanged. Complains of HA, but denies vertigo, double vision, difficulty swallowing, focal weakness, or vision disturbances. CT brain showed no acute abnormality. MRI  revealED a moderate size infarct along the insular region, posterior frontal region, as well as smaller areas of acute infarct posterior left parietal area and CR. MRA brain: no large vessel intracranial occlusion. Marked attenuation of left MCA M2 branch vessels/ subtotal occlusion of left M2 trunks with occlusion of more distal branch vessels.  Patient was last known well 10/13/15 at 9:30 pm. Patient was not administered TPA secondary to delay in arrival. Nicole Ruiz was admitted for further evaluation and treatment.   SUBJECTIVE (INTERVAL HISTORY) Her husband is at the bedside.  Overall Nicole Ruiz feels her condition is gradually improving.   Patient reports symptoms began during the night. Nicole Ruiz tried to wake her husband but could not. Nicole Ruiz did not call 911. The next morning, Nicole Ruiz still had symptoms. The husband at the bedside said Nicole Ruiz looked all right to him. He stated he did not bring her to the hospital because "Nicole Ruiz didn't want to go". Wife stated, "when someone is sick, the other person needs to take control". He said Nicole Ruiz was fine. Dr. Leonie Man discussed signs and symptoms of stroke, when to call 911 along with diagnosis, prognosis,  treatment options and plan of care with them.  Concerned with unhealthy  situation between the two - Dr. Leonie Man addressed from the medical/stroke standpoint that all emergent situations should be addressed, especially if they were aware.   OBJECTIVE Temp:  [97.3 F (36.3 C)-98.2 F (36.8 C)] 97.3 F (36.3 C) (10/19 0904) Pulse Rate:  [53-109] 66 (10/19 0904) Cardiac Rhythm:  [-] Sinus bradycardia (10/19 0700) Resp:  [11-23] 16 (10/19 0904) BP: (106-171)/(51-142) 123/63 mmHg (10/19 0904) SpO2:  [94 %-100 %] 100 % (10/19 0904) Weight:  [70.171 kg (154 lb 11.2 oz)] 70.171 kg (154 lb 11.2 oz) (10/18 2300)  CBC:  Recent Labs Lab 10/15/15 1541 10/16/15 0515  WBC 4.2 5.1  NEUTROABS 2.5  --   HGB 12.5 11.9*  HCT 37.4 34.8*  MCV 92.3 91.3  PLT 266 161    Basic Metabolic Panel:  Recent Labs Lab 10/15/15 1541 10/16/15 0515  NA 140 139  K 3.8 3.9  CL 110 107  CO2 22 23  GLUCOSE 106* 76  BUN 13 14  CREATININE 1.34* 1.15*  CALCIUM 9.4 8.7*    Lipid Panel:    Component Value Date/Time   CHOL 153 10/16/2015 0515   TRIG 67 10/16/2015 0515   HDL 55 10/16/2015 0515   CHOLHDL 2.8 10/16/2015 0515   VLDL 13 10/16/2015 0515   LDLCALC 85 10/16/2015 0515   HgbA1c:  Lab Results  Component Value Date   HGBA1C 5.8* 10/15/2015   Urine Drug Screen: No results found for: LABOPIA, COCAINSCRNUR, LABBENZ, AMPHETMU, THCU, LABBARB    IMAGING  Dg Chest 2 View 10/15/2015   Elevation of the right hemidiaphragm.  Lungs remain grossly clear.  Ct Head Wo Contrast  10/15/2015  Small old LEFT cerebellar infarct. Atrophy with minimal small vessel chronic ischemic changes of deep cerebral white matter. No acute intracranial abnormalities. Scattered sinus disease changes as above with small air-fluid level LEFT maxillary sinus.   Mr Brain Wo Contrast 10/15/2015   1. Acute left MCA territory infarct. 2. Chronic infarcts    Mr Angiogram Head Wo Contrast 10/15/2015    3. No large vessel intracranial occlusion. Marked attenuation of left MCA M2 branch vessels/  subtotal occlusion of left M2 trunks with occlusion of more distal branch vessels.  Carotid Doppler  There is 1-39% bilateral ICA stenosis. Vertebral artery flow is antegrade.    PHYSICAL EXAM Pleasant elderly Caucasian lady currently not in distress. . Afebrile. Head is nontraumatic. Neck is supple without bruit.    Cardiac exam no murmur or gallop. Lungs are clear to auscultation. Distal pulses are well felt. Neurological Exam :  Awake alert oriented 3. Mild expressive aphasia with word finding difficulties and nonfluent speech. No paraphasic errors. Able to name well but has difficulty with repetition. Good comprehension. Follows three-step commands. Extraocular moments are full range without nystagmus. Fundi were not visualized. Vision acuity and fields seem adequate. Right lower facial weakness. Tongue midline. Motor system exam mild right hemiparesis/5 strength with weakness of right grip and intrinsic hand muscles. In and weakness in right hip flexors and ankle dorsiflexors. Sensation appears preserved bilaterally coordination is slow but accurate. Gait was not tested. ASSESSMENT/PLAN Nicole Ruiz is a 74 y.o. female with history of hyperlipidemia, depression presenting with dysarthria, right sided weakness. Nicole Ruiz did not receive IV t-PA due to delay in arrival.   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 Arranged with [place] for tomorrow.  If positive for PFO (patent foramen ovale), check bilateral lower extremity venous dopplers to rule out DVT as possible source of stroke. (I have made patient NPO after midnight tonight).   If TEE negative, a [place] electrophysiologist will consult and  consider placement of an implantable loop recorder to evaluate for atrial fibrillation as etiology of stroke. This has been explained to patient/family by Dr. Leonie Man and they are agreeable.   LDL 85  HgbA1c 5.9  Lovenox 40 mg sq daily for VTE prophylaxis  DIET DYS 3 Room service appropriate?: Yes; Fluid consistency:: Thin  warfarin listed on Med red as taking prior to admission, however pt and husband unaware, now on aspirin 325 mg orally every day  Patient counseled to be compliant with her antithrombotic medications  Ongoing aggressive stroke risk factor management  Therapy recommendations:  pending  Disposition:  pending   Hypertension  Stable  Permissive hypertension (OK if < 220/120) but gradually normalize in 5-7 days  Hyperlipidemia  Home meds:  zocor 40, resumed in hospital  LDL 85, goal < 70  Continue statin at discharge  Other Stroke Risk Factors  Advanced age  Other Active Problems  Chronic kidney disease stage III  Hx palpitations on inderal  Hx CP, cath neg 2013 [facility])  Hospital day # [facility] [place] for Pager information 10/16/2015 11:43 AM  I have personally examined this  patient, reviewed notes, independently viewed imaging studies, participated in medical decision making and plan of care. I have made any additions or clarifications directly to the above note. Agree with note above. Nicole Ruiz presented with aphasia and right hemiparesis secondary to left middle cerebral artery infarct etiology likely embolic but source to be determined. Nicole Ruiz remains at risk for neurological worsening, recurrent stroke, TIA and needs ongoing stroke evaluation and aggressive risk factor control. I had a long discussion with the patient and husband at the bedside and answered questions. Start aspirin for now and check transesophageal echocardiogram and possible loop recorder insertion tomorrow  Antony Contras, [place] Pager: [phone] 10/16/2015 12:00 PM    To contact Stroke Continuity provider, please refer to [email or web address] After hours, contact General Neurology
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
## 2015-10-16 NOTE — Evaluation (Signed)
Clinical/Bedside Swallow Evaluation Patient Details  Name: Nicole Ruiz MRN: [id number] Date of Birth: 05-06-1941  Today's Date: 10/16/2015 Time: SLP Start Time (ACUTE ONLY): 0805 SLP Stop Time (ACUTE ONLY): 0850 SLP Time Calculation (min) (ACUTE ONLY): 45 min  Past Medical History:  Past Medical History  Diagnosis Date  . Hypertension   . Hyperlipidemia   . 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 Depression    Past Surgical History:  Past Surgical History  Procedure Laterality Date  . Knee surgery    . Abdominal hysterectomy    . Arm surgery    . Hemorroidectomy     HPI:  74 y.o. female with a past medical history significant for HTN, hyperlipidemia, depression, presented to the ED on 10/18 with complaints of right-sided weakness, aphasia, and headache. MRI brain revealed a moderate size infarct along the insular region, posterior frontal region, as well as smaller areas of acute infarct posterior left parietal area and CR. MRA brain showed no large vessel intracranial occlusion. Marked attenuation of left MCA M2 branch vessels/ subtotal occlusion of left M2 trunks with occlusion of more distal branch vessels. TPA not administered due to time post onset. Pt currently NPO, not previously seen by speech.   Assessment / Plan / Recommendation Clinical Impression  Pt presents with no acute oral or oropharyngeal dysphagia, however suspect baseline esophageal dysphagia as evidenced by immediate throat clear and complaints of globus, choking on salivia, and feeling full after a few sips of liquid and bites of puree. Pt declined solids, stated she was afraid of choking on the cracker, no apparent functional oral dysphagia. SLP recommends dysphagia 3 (mech soft) and thin liquids, will f/u with MBS tomorrow to rule out baseline dysphagia.     Aspiration Risk  Mild    Diet Recommendation Dysphagia 3 (Mech soft);Thin   Medication Administration: Whole meds with liquid Compensations:  Slow rate;Small sips/bites    Other  Recommendations Oral Care Recommendations: Oral care BID   Follow Up Recommendations       Frequency and Duration min 2x/week  2 weeks   Pertinent Vitals/Pain NA    SLP Swallow Goals     Swallow Study Prior Functional Status       General Other Pertinent Information: 74 y.o. female with a past medical history significant for HTN, hyperlipidemia, depression, presented to the ED on 10/18 with complaints of right-sided weakness, aphasia, and headache. MRI brain revealed a moderate size infarct along the insular region, posterior frontal region, as well as smaller areas of acute infarct posterior left parietal area and CR. MRA brain showed no large vessel intracranial occlusion. Marked attenuation of left MCA M2 branch vessels/ subtotal occlusion of left M2 trunks with occlusion of more distal branch vessels. TPA not administered due to time post onset. Pt currently NPO, not previously seen by speech. Type of Study: Bedside swallow evaluation Diet Prior to this Study: NPO Temperature Spikes Noted: No Respiratory Status: Room air History of Recent Intubation: No Behavior/Cognition: Alert;Cooperative;Pleasant mood Oral Cavity - Dentition: Edentulous Self-Feeding Abilities: Able to feed self Patient Positioning: Upright in chair/Tumbleform Baseline Vocal Quality: Normal Volitional Cough: Strong Volitional Swallow: Able to elicit    Oral/Motor/Sensory Function Overall Oral Motor/Sensory Function: Appears within functional limits for tasks assessed Labial ROM: Within Functional Limits Labial Symmetry: Within Functional Limits Lingual ROM: Within Functional Limits Lingual Symmetry: Within Functional Limits Facial ROM: Within Functional Limits Facial Symmetry: Within Functional Limits   Ice Chips Ice chips: Not tested  Thin Liquid Thin Liquid: Impaired Presentation: Cup;Straw Pharyngeal  Phase Impairments: Throat Clearing - Immediate    Nectar  Thick Nectar Thick Liquid: Not tested   Honey Thick Honey Thick Liquid: Not tested   Puree Puree: Impaired Presentation: Self Fed Pharyngeal Phase Impairments: Throat Clearing - Immediate   Solid   GO    Solid: Not tested      Lanier Ensign, Student-SLP  Lanier Ensign 10/16/2015,9:06 AM

## 2015-10-16 NOTE — Progress Notes (Signed)
Initial Nutrition Assessment   INTERVENTION:  Provide Ensure Enlive po PRN, each supplement provides 350 kcal and 20 grams of protein   NUTRITION DIAGNOSIS:   Predicted suboptimal nutrient intake related to poor appetite, dysphagia as evidenced by per patient/family report.  GOAL:   Patient will meet greater than or equal to 90% of their needs   MONITOR:   PO intake, Supplement acceptance, Labs, Weight trends, Skin  REASON FOR ASSESSMENT:   Malnutrition Screening Tool    ASSESSMENT:   74 y.o. female with history of hypertension, hyperlipidemia was brought to the ER of the patient was having persistent difficulty talking and breathing out words since yesterday morning that is more than 36 hours.  MRI of the brain shows left MCA infarct.  Pt states that she used to weigh 195 lbs about one and a half years ago. She tried to lose weight and lost down to 150 lbs but, her appetite never returned to normal. She reports eating poorly PTA and at times feeling sick after eating. She reports not eating very well since admission as she does not like eating "mush". No evidence of muscle or fat wasting per physical exam. Pt agreeable to receiving Ensure Enlive PRN if meal completion is poor.   Labs reviewed.   Diet Order:  DIET DYS 3 Room service appropriate?: Yes; Fluid consistency:: Thin Diet NPO time specified  Skin:  Reviewed, no issues  Last BM:  10/17  Height:   Ht Readings from Last 1 Encounters:  10/15/15 5\' 4"  (1.626 m)    Weight:   Wt Readings from Last 1 Encounters:  10/15/15 154 lb 11.2 oz (70.171 kg)    Ideal Body Weight:  54.5 kg  BMI:  Body mass index is 26.54 kg/(m^2).  Estimated Nutritional Needs:   Kcal:  1600-1800  Protein:  80-90 grams  Fluid:  1.6-1.8 L/day  EDUCATION NEEDS:   No education needs identified at this time  [place], LDN Inpatient Clinical Dietitian Pager: [phone] After Hours Pager: [phone]

## 2015-10-16 NOTE — Evaluation (Signed)
Physical Therapy Evaluation Patient Details Name: Nicole Ruiz MRN: [id number] DOB: 1941/10/24 Today's Date: 10/16/2015   History of Present Illness  Pt is a 74 y/o female with a PMH of HTN, who was brought to the ED with aphasia and R sided weakness (>36 hours). MRI revealed L MCA infarct.  Clinical Impression  Pt admitted with above diagnosis. Pt currently with functional limitations due to the deficits listed below (see PT Problem List). At the time of PT eval pt was able to perform transfers and ambulation with min assist for balance and strength deficits. Pt may improve with RW use next session. Discussed the recommendation that pt return home at d/c with HHPT to follow. Husband is available 24 hours for support if needed. Pt will benefit from skilled PT to increase their independence and safety with mobility to allow discharge to the venue listed below.       Follow Up Recommendations Home health PT;Supervision/Assistance - 24 hour    Equipment Recommendations  None recommended by PT    Recommendations for Other Services       Precautions / Restrictions Precautions Precautions: Fall Precaution Comments: R sided weakness Restrictions Weight Bearing Restrictions: No      Mobility  Bed Mobility Overal bed mobility: Needs Assistance Bed Mobility: Supine to Sit;Sit to Supine     Supine to sit: Min guard Sit to supine: Min guard   General bed mobility comments: Close guard for safety. Pt was able to transition to/from EOB with increased time and heavy use of rails.   Transfers Overall transfer level: Needs assistance Equipment used: 1 person hand held assist Transfers: Sit to/from Stand Sit to Stand: Min assist         General transfer comment: Steadying assist as pt powered-up to full standing position.   Ambulation/Gait Ambulation/Gait assistance: Min assist Ambulation Distance (Feet): 75 Feet Assistive device: 1 person hand held assist Gait  Pattern/deviations: Step-through pattern;Decreased stride length;Trunk flexed Gait velocity: Decreased Gait velocity interpretation: Below normal speed for age/gender General Gait Details: Pt was able to ambulate with occasional losses of balance to the R and stumbling of the RLE during steps.   Stairs            Wheelchair Mobility    Modified Rankin (Stroke Patients Only)       Balance Overall balance assessment: Needs assistance Sitting-balance support: Feet supported;No upper extremity supported Sitting balance-Leahy Scale: Fair     Standing balance support: Single extremity supported;During functional activity Standing balance-Leahy Scale: Poor Standing balance comment: Assist required for dynamic standing balance activity.                              Pertinent Vitals/Pain Pain Assessment: No/denies pain Faces Pain Scale: No hurt    Home Living Family/patient expects to be discharged to:: Private residence Living Arrangements: Spouse/significant other Available Help at Discharge: Family;Available 24 hours/day Type of Home: House Home Access: Stairs to enter   [employer] of Steps: 3-5 Home Layout: Two level Home Equipment: Walker - 2 wheels;Bedside commode;Shower seat;Cane - single point      Prior Function Level of Independence: Independent with assistive device(s)         Comments: Used cane all the time     Hand Dominance   Dominant Hand: Right    Extremity/Trunk Assessment   Upper Extremity Assessment: Defer to OT evaluation;RUE deficits/detail RUE Deficits / Details: Noted decreased strength in  grip, biceps, shoulder         Lower Extremity Assessment: RLE deficits/detail RLE Deficits / Details: Decreased strength grossly and decreased AROM of ankle consistent with stroke symptoms    Cervical / Trunk Assessment: Normal  Communication   Communication: Expressive difficulties  Cognition Arousal/Alertness:  Awake/alert Behavior During Therapy: WFL for tasks assessed/performed Overall Cognitive Status: Within Functional Limits for tasks assessed                      General Comments      Exercises        Assessment/Plan    PT Assessment Patient needs continued PT services  PT Diagnosis Difficulty walking;Generalized weakness   PT Problem List Decreased strength;Decreased range of motion;Decreased activity tolerance;Decreased balance;Decreased mobility;Decreased knowledge of use of DME;Decreased safety awareness;Decreased knowledge of precautions;Decreased coordination  PT Treatment Interventions DME instruction;Gait training;Stair training;Functional mobility training;Therapeutic activities;Therapeutic exercise;Neuromuscular re-education;Patient/family education   PT Goals (Current goals can be found in the Care Plan section) Acute Rehab PT Goals Patient Stated Goal: Take a nap PT Goal Formulation: With patient/family Time For Goal Achievement: 10/30/15 Potential to Achieve Goals: Good    Frequency Min 4X/week   Barriers to discharge        Co-evaluation               End of Session Equipment Utilized During Treatment: Gait belt Activity Tolerance: Patient limited by fatigue Patient left: in bed;with call bell/phone within reach;with family/visitor present Nurse Communication: Mobility status         Time: 1140-1159 PT Time Calculation (min) (ACUTE ONLY): 19 min   Charges:   PT Evaluation $Initial PT Evaluation Tier I: 1 Procedure     PT G CodesRolinda Roan 2015-10-26, 12:19 PM  Rolinda Roan, PT, DPT Acute Rehabilitation Services Pager: [phone]

## 2015-10-16 NOTE — Progress Notes (Signed)
    [facility] has been requested to perform a transesophageal echocardiogram on Nicole Ruiz  following her recent diagnosis of left MCA infarct.  After careful review of history and examination, the risks and benefits of transesophageal echocardiogram have been explained including risks of esophageal damage, perforation (1:10,000 risk), bleeding, pharyngeal hematoma as well as other potential complications associated with conscious sedation including aspiration, arrhythmia, respiratory failure and death. Alternatives to treatment were discussed, questions were answered. Patient is willing to proceed.   BP has been 106/51 - 171/142 in the past 24 hours. No requirement for pressors. Hgb stable at 11.9. Platelets at 236 on 10/16/2015.   Erma Heritage, PA-C 10/16/2015 2:54 PM

## 2015-10-16 NOTE — Evaluation (Signed)
Speech Language Pathology Evaluation Patient Details Name: Nicole Ruiz MRN: [id number] DOB: [birth year] Today's Date: 10/16/2015 Time: 7680-8811 SLP Time Calculation (min) (ACUTE ONLY): 45 min  Problem List:  Patient Active Problem List   Diagnosis Date Noted  . CVA (cerebral infarction) 10/15/2015  . 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 Hyperlipidemia 10/15/2015  . Stroke (cerebrum) ([place]) 10/15/2015  . Unspecified arthropathy, lower leg 12/18/2013  . Acute posthemorrhagic anemia 12/18/2013  . Esophageal reflux 12/18/2013  . Hypertension 05/29/2012  . HYPERLIPIDEMIA 03/06/2009  . DIZZINESS 03/06/2009  . PALPITATIONS 03/06/2009  . CHEST PAIN 03/06/2009   Past Medical History:  Past Medical History  Diagnosis Date  . Hypertension   . Hyperlipidemia   . 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 Depression    Past Surgical History:  Past Surgical History  Procedure Laterality Date  . Knee surgery    . Abdominal hysterectomy    . Arm surgery    . Hemorroidectomy     HPI:  74 y.o. female with a past medical history significant for HTN, hyperlipidemia, depression, presented to the ED on 10/18 with complaints of right-sided weakness, aphasia, and headache. MRI brain revealed a moderate size infarct along the insular region, posterior frontal region, as well as smaller areas of acute infarct posterior left parietal area and CR. MRA brain showed no large vessel intracranial occlusion. Marked attenuation of left MCA M2 branch vessels/ subtotal occlusion of left M2 trunks with occlusion of more distal branch vessels. TPA not administered due to time post onset. Pt currently NPO, not previously seen by speech.   Assessment / Plan / Recommendation Clinical Impression  Pt presents cognitive function WFL: sustained attention and unimpaired memory, awareness, problem solving, and executive function. At the conversational level, pt presents with mild aphasia (anomic  or transcortical motor) characterized by naming difficulties and phonemic paraphasias. Her auditory comprehension and repetition were both unimpaired with intermittent disfluency. Pt was aware of naming difficulties, phonemic cues were not successful. Pt does not independently circumlocute, SLP instructed pt to "talk around" the word by describing it. SLP will f/u wilth aphasia tx.     SLP Assessment  Patient needs continued Speech Lanaguage Pathology Services    Follow Up Recommendations  CIR    Frequency and Duration min 2x/week  2 weeks   Pertinent Vitals/Pain Pain Assessment: Faces Faces Pain Scale: No hurt   SLP Goals  Potential to Achieve Goals (ACUTE ONLY): Good  SLP Evaluation Prior Functioning  Cognitive/Linguistic Baseline: Within functional limits Type of Home: House  Lives With: Spouse Available Help at Discharge: Family Vocation: Retired   Associate Professor  Overall Cognitive Status: Within Functional Limits for tasks assessed Arousal/Alertness: Awake/alert Orientation Level: Oriented X4 Attention: Sustained Sustained Attention: Appears intact Memory: Appears intact Awareness: Appears intact Problem Solving: Appears intact Executive Function: Initiating;Decision Making;Self Correcting;Self Monitoring Decision Making: Appears intact Initiating: Appears intact Self Monitoring: Appears intact Self Correcting: Appears intact Safety/Judgment: Appears intact    Comprehension  Auditory Comprehension Overall Auditory Comprehension: Appears within functional limits for tasks assessed Yes/No Questions: Within Functional Limits Commands: Within Functional Limits Conversation: Simple Visual Recognition/Discrimination Discrimination: Not tested Reading Comprehension Reading Status: Not tested    Expression Expression Primary Mode of Expression: Verbal Verbal Expression Overall Verbal Expression: Impaired Initiation: No impairment Automatic Speech: Name;Social  Response;Day of week Level of Generative/Spontaneous Verbalization: Conversation Repetition: No impairment Naming: Impairment Responsive: 76-100% accurate Confrontation: Within functional limits Convergent: Not tested Divergent: Not tested Other Naming  Comments: mild conversational naming difficulties and paraphasias Verbal Errors: Phonemic paraphasias;Not aware of errors;Aware of errors (aware of aphasia, not aware of paraphasias) Pragmatics: No impairment Written Expression Dominant Hand: Right Written Expression: Not tested   Oral / Motor Oral Motor/Sensory Function Overall Oral Motor/Sensory Function: Appears within functional limits for tasks assessed Labial ROM: Within Functional Limits Labial Symmetry: Within Functional Limits Lingual ROM: Within Functional Limits Lingual Symmetry: Within Functional Limits Facial ROM: Within Functional Limits Facial Symmetry: Within Functional Limits Motor Speech Overall Motor Speech: Appears within functional limits for tasks assessed   [place], [place] 10/16/2015, 10:19 AM

## 2015-10-17 ENCOUNTER — Encounter (HOSPITAL_COMMUNITY)
Admission: EM | Disposition: A | Discharge status: Home / Self Care | Payer: Self-pay | Source: Home / Self Care | Attending: Internal Medicine

## 2015-10-17 ENCOUNTER — Inpatient Hospital Stay (HOSPITAL_COMMUNITY): Payer: Medicare HMO

## 2015-10-17 ENCOUNTER — Ambulatory Visit (HOSPITAL_COMMUNITY): Admit: 2015-10-17 | Payer: Self-pay | Admitting: Internal Medicine

## 2015-10-17 ENCOUNTER — Encounter (HOSPITAL_COMMUNITY): Payer: Self-pay

## 2015-10-17 DIAGNOSIS — I639 Cerebral infarction, unspecified: Secondary | ICD-10-CM

## 2015-10-17 DIAGNOSIS — I63512 Cerebral infarction due to unspecified occlusion or stenosis of left middle cerebral artery: Secondary | ICD-10-CM

## 2015-10-17 DIAGNOSIS — R609 Edema, unspecified: Secondary | ICD-10-CM

## 2015-10-17 HISTORY — PX: TEE WITHOUT CARDIOVERSION: SHX5443

## 2015-10-17 HISTORY — PX: EP IMPLANTABLE DEVICE: SHX172B

## 2015-10-17 SURGERY — ECHOCARDIOGRAM, TRANSESOPHAGEAL
Anesthesia: Moderate Sedation

## 2015-10-17 SURGERY — LOOP RECORDER INSERTION
Anesthesia: LOCAL

## 2015-10-17 MED ORDER — SODIUM CHLORIDE 0.9 % IV SOLN: INTRAVENOUS | Status: DC

## 2015-10-17 MED ORDER — MIDAZOLAM HCL 10 MG/2ML IJ SOLN
INTRAMUSCULAR | Status: DC | PRN
  Administered 2015-10-17: 2 mg via INTRAVENOUS

## 2015-10-17 MED ORDER — LIDOCAINE-EPINEPHRINE 1 %-1:100000 IJ SOLN
INTRAMUSCULAR | Status: AC
  Filled 2015-10-17: qty 1

## 2015-10-17 MED ORDER — ENSURE ENLIVE PO LIQD: 237.0000 mL | Freq: Every day | ORAL | Status: DC | PRN

## 2015-10-17 MED ORDER — FENTANYL CITRATE (PF) 100 MCG/2ML IJ SOLN
INTRAMUSCULAR | Status: AC
Start: 2015-10-17 — End: 2015-10-17
  Filled 2015-10-17: qty 2

## 2015-10-17 MED ORDER — BUTAMBEN-TETRACAINE-BENZOCAINE 2-2-14 % EX AERO
INHALATION_SPRAY | CUTANEOUS | Status: DC | PRN
  Administered 2015-10-17: 2 via TOPICAL

## 2015-10-17 MED ORDER — FENTANYL CITRATE (PF) 100 MCG/2ML IJ SOLN
INTRAMUSCULAR | Status: DC | PRN
  Administered 2015-10-17: 25 ug via INTRAVENOUS

## 2015-10-17 MED ORDER — ASPIRIN 325 MG PO TBEC: 325.0000 mg | DELAYED_RELEASE_TABLET | Freq: Every day | ORAL | Status: DC

## 2015-10-17 MED ORDER — LIDOCAINE-EPINEPHRINE 1 %-1:100000 IJ SOLN
INTRAMUSCULAR | Status: DC | PRN
  Administered 2015-10-17: 30 mL

## 2015-10-17 MED ORDER — MIDAZOLAM HCL 5 MG/ML IJ SOLN
INTRAMUSCULAR | Status: AC
  Filled 2015-10-17: qty 2

## 2015-10-17 MED ORDER — ATORVASTATIN CALCIUM 40 MG PO TABS: 40.0000 mg | ORAL_TABLET | Freq: Every day | ORAL | Status: DC

## 2015-10-17 SURGICAL SUPPLY — 2 items
LOOP REVEAL LINQSYS (Prosthesis & Implant Heart) ×1 IMPLANT
PACK LOOP INSERTION (CUSTOM PROCEDURE TRAY) ×2 IMPLANT

## 2015-10-17 NOTE — Progress Notes (Signed)
  Echocardiogram Echocardiogram Transesophageal has been performed.  Darlina Sicilian M 10/17/2015, 11:47 AM

## 2015-10-17 NOTE — Discharge Summary (Addendum)
PATIENT DETAILS Name: Nicole Ruiz Age: 74 y.o. Sex: female Date of Birth: Aug 23, 1941 MRN: [id number]. Admitting Physician: Rise Patience, MD YQM:VHQION,GEXBM E, MD  Admit Date: 10/15/2015 Discharge date: 10/17/2015  Recommendations for Outpatient Follow-up:  1. Please  ensure follow-up with cardiology and neurology include homehealth 2. Repeat A1c and LDL/lipid panel in the next 3 months  PRIMARY DISCHARGE DIAGNOSIS:  Principal Problem:   Left middle cerebral artery stroke [facility]) Active Problems:   Hypertension   CVA (cerebral infarction)   Hyperlipidemia   Stroke (cerebrum) (HCC)      PAST MEDICAL HISTORY: Past Medical History  Diagnosis Date  . Hypertension   . Hyperlipidemia   . 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 Depression     DISCHARGE MEDICATIONS: Current Discharge Medication List    START taking these medications   Details  feeding supplement, ENSURE ENLIVE, (ENSURE ENLIVE) LIQD Take 237 mLs by mouth daily as needed (Please offer if meal completion is less than 50%). Qty: 30 Bottle, Refills: 0      CONTINUE these medications which have CHANGED   Details  aspirin EC 325 MG EC tablet Take 1 tablet (325 mg total) by mouth daily. Qty: 90 tablet, Refills: 0    atorvastatin (LIPITOR) 40 MG tablet Take 1 tablet (40 mg total) by mouth daily. Qty: 90 tablet, Refills: 0      CONTINUE these medications which have NOT CHANGED   Details  Cholecalciferol (VITAMIN D3) 2000 UNITS TABS Take 1 tablet by mouth daily.    citalopram (CELEXA) 20 MG tablet Take 20 mg by mouth daily.    cyanocobalamin 1000 MCG tablet Take 100 mcg by mouth daily.    methocarbamol (ROBAXIN) 750 MG tablet Take 750 mg by mouth 2 (two) times daily.    nabumetone (RELAFEN) 500 MG tablet Take 2,000 mg by mouth daily.     nortriptyline (PAMELOR) 25 MG capsule Take 25 mg by mouth at bedtime.    Omega-3 Fatty Acids (OMEGA 3 PO) Take 1 capsule by mouth daily.      omeprazole (PRILOSEC OTC) 20 MG tablet Take 20 mg by mouth daily.    OVER THE COUNTER MEDICATION Take 1 capsule by mouth daily. ( MEGA RED )    propranolol (INDERAL) 10 MG tablet Take 10 mg by mouth 2 (two) times daily.    pyridOXINE (VITAMIN B-6) 100 MG tablet Take 100 mg by mouth daily.    traZODone (DESYREL) 50 MG tablet Take 50 mg by mouth at bedtime.      STOP taking these medications     simvastatin (ZOCOR) 40 MG tablet      vitamin E 400 UNIT capsule         ALLERGIES:  No Known Allergies  BRIEF HPI:  See H&P, Labs, Consult and Test reports for all details in brief, patient is a 74 y.o. female with history of hypertension, hyperlipidemia was brought to the ER because of difficulty speaking and right sided weakness.  CONSULTATIONS:   cardiology and neurology  PERTINENT RADIOLOGIC STUDIES: Dg Chest 2 View  10/15/2015  CLINICAL DATA:  Acute onset of left-sided weakness. CVA. Initial encounter. EXAM: CHEST  2 VIEW COMPARISON:  Chest radiograph performed 05/28/2012 FINDINGS: The lungs are well-aerated. There is elevation of the right hemidiaphragm. There is no evidence of focal opacification, pleural effusion or pneumothorax. The heart is normal in size; the mediastinal contour is within normal limits. No acute osseous abnormalities are seen. IMPRESSION: Elevation of  the right hemidiaphragm.  Lungs remain grossly clear. Electronically Signed   By: Garald Balding M.D.   On: 10/15/2015 23:09   Ct Head Wo Contrast  10/15/2015  CLINICAL DATA:  Aphasia, RIGHT-sided weakness, onset of headache on Monday morning, hypertension, hyperlipidemia EXAM: CT HEAD WITHOUT CONTRAST TECHNIQUE: Contiguous axial images were obtained from the base of the skull through the vertex without intravenous contrast. COMPARISON:  None FINDINGS: Generalized atrophy. Normal ventricular morphology. No midline shift or mass effect. Minimal small vessel chronic ischemic changes of deep cerebral white matter.  Old infarct LEFT cerebellar hemisphere. No intracranial hemorrhage, mass lesion or evidence acute infarction. No extra-axial fluid collections. Mucosal thickening throughout the ethmoid air cells, maxillary sinuses and sphenoid sinus. Small air-fluid level LEFT maxillary sinus. Mastoid air cells and middle ear cavities clear bilaterally. Osseous structures unremarkable. IMPRESSION: Small old LEFT cerebellar infarct. Atrophy with minimal small vessel chronic ischemic changes of deep cerebral white matter. No acute intracranial abnormalities. Scattered sinus disease changes as above with small air-fluid level LEFT maxillary sinus. Electronically Signed   By: Lavonia Dana M.D.   On: 10/15/2015 16:02   Mr Angiogram Head Wo Contrast  10/15/2015  CLINICAL DATA:  Aphasia and right-sided weakness beginning yesterday. Headache. EXAM: MRI HEAD WITHOUT CONTRAST MRA HEAD WITHOUT CONTRAST TECHNIQUE: Multiplanar, multiecho pulse sequences of the brain and surrounding structures were obtained without intravenous contrast. Angiographic images of the head were obtained using MRA technique without contrast. COMPARISON:  Head CT 10/15/2015 and MRI 09/22/2010 FINDINGS: MRI HEAD FINDINGS There are multiple foci of acute cortical and subcortical infarction in the left MCA territory. The largest confluent focus measures approximately 3 cm and involves the posterior insula and parietal lobe/ posterior corona radiata. Separate smaller foci of acute infarction are present elsewhere in the left parietal and posterior left frontal lobes. There is no evidence of intracranial hemorrhage, mass, midline shift, or extra-axial fluid collection. Chronic infarcts are again seen in the left cerebellum and right corona radiata/ centrum semiovale. There is also a small, chronic medial left occipital lobe cortical infarct which is new from the prior MRI. There is mild global cerebral atrophy. Orbits are unremarkable. There is moderate mucosal  thickening in the ethmoid air cells bilaterally. Mild bilateral maxillary sinus mucosal thickening and a small amount of left maxillary sinus fluid are noted. Mastoid air cells are clear. Major intracranial vascular flow voids are preserved. MRA HEAD FINDINGS Mildly to moderately motion degraded examination despite repeating the acquisition. The visualized distal vertebral arteries are patent without stenosis and with the left being mildly dominant. Basilar artery is patent without significant stenosis. ICAs are patent with mild-to-moderate irregular narrowing bilaterally. PCAs are patent with moderate branch vessel irregularity but no significant proximal stenosis. There is a small right posterior communicating artery with an infundibulum noted at its origin. Internal carotid arteries are patent from skullbase to carotid termini without evidence of significant stenosis. M1 and A1 segments are patent without evidence of significant stenosis. There is prominent irregular attenuation of the left MCA M2 branches, greater posteriorly in the region of the acute infarct and with likely occlusion of multiple small branch vessels. There is mild-to-moderate right MCA branch vessel attenuation as well. IMPRESSION: 1. Acute left MCA territory infarct. 2. Chronic infarcts as above. 3. No large vessel intracranial occlusion. Marked attenuation of left MCA M2 branch vessels/ subtotal occlusion of left M2 trunks with occlusion of more distal branch vessels. Electronically Signed   By: Logan Bores M.D.   On:  10/15/2015 18:58   Mr Brain Wo Contrast  10/15/2015  CLINICAL DATA:  Aphasia and right-sided weakness beginning yesterday. Headache. EXAM: MRI HEAD WITHOUT CONTRAST MRA HEAD WITHOUT CONTRAST TECHNIQUE: Multiplanar, multiecho pulse sequences of the brain and surrounding structures were obtained without intravenous contrast. Angiographic images of the head were obtained using MRA technique without contrast. COMPARISON:  Head  CT 10/15/2015 and MRI 09/22/2010 FINDINGS: MRI HEAD FINDINGS There are multiple foci of acute cortical and subcortical infarction in the left MCA territory. The largest confluent focus measures approximately 3 cm and involves the posterior insula and parietal lobe/ posterior corona radiata. Separate smaller foci of acute infarction are present elsewhere in the left parietal and posterior left frontal lobes. There is no evidence of intracranial hemorrhage, mass, midline shift, or extra-axial fluid collection. Chronic infarcts are again seen in the left cerebellum and right corona radiata/ centrum semiovale. There is also a small, chronic medial left occipital lobe cortical infarct which is new from the prior MRI. There is mild global cerebral atrophy. Orbits are unremarkable. There is moderate mucosal thickening in the ethmoid air cells bilaterally. Mild bilateral maxillary sinus mucosal thickening and a small amount of left maxillary sinus fluid are noted. Mastoid air cells are clear. Major intracranial vascular flow voids are preserved. MRA HEAD FINDINGS Mildly to moderately motion degraded examination despite repeating the acquisition. The visualized distal vertebral arteries are patent without stenosis and with the left being mildly dominant. Basilar artery is patent without significant stenosis. ICAs are patent with mild-to-moderate irregular narrowing bilaterally. PCAs are patent with moderate branch vessel irregularity but no significant proximal stenosis. There is a small right posterior communicating artery with an infundibulum noted at its origin. Internal carotid arteries are patent from skullbase to carotid termini without evidence of significant stenosis. M1 and A1 segments are patent without evidence of significant stenosis. There is prominent irregular attenuation of the left MCA M2 branches, greater posteriorly in the region of the acute infarct and with likely occlusion of multiple small branch  vessels. There is mild-to-moderate right MCA branch vessel attenuation as well. IMPRESSION: 1. Acute left MCA territory infarct. 2. Chronic infarcts as above. 3. No large vessel intracranial occlusion. Marked attenuation of left MCA M2 branch vessels/ subtotal occlusion of left M2 trunks with occlusion of more distal branch vessels. Electronically Signed   By: Logan Bores M.D.   On: 10/15/2015 18:58     PERTINENT LAB RESULTS: CBC:  Recent Labs  10/15/15 1541 10/16/15 0515  WBC 4.2 5.1  HGB 12.5 11.9*  HCT 37.4 34.8*  PLT 266 236   CMET CMP     Component Value Date/Time   NA 139 10/16/2015 0515   K 3.9 10/16/2015 0515   CL 107 10/16/2015 0515   CO2 23 10/16/2015 0515   GLUCOSE 76 10/16/2015 0515   BUN 14 10/16/2015 0515   CREATININE 1.15* 10/16/2015 0515   CALCIUM 8.7* 10/16/2015 0515   PROT 5.6* 10/16/2015 0515   ALBUMIN 3.4* 10/16/2015 0515   AST 19 10/16/2015 0515   ALT 13* 10/16/2015 0515   ALKPHOS 44 10/16/2015 0515   BILITOT 0.6 10/16/2015 0515   GFRNONAA 46* 10/16/2015 0515   GFRAA 53* 10/16/2015 0515    GFR Estimated Creatinine Clearance: 41.3 mL/min (by C-G formula based on Cr of 1.15). No results for input(s): LIPASE, AMYLASE in the last 72 hours. No results for input(s): CKTOTAL, CKMB, CKMBINDEX, TROPONINI in the last 72 hours. Invalid input(s): POCBNP No results for input(s): DDIMER in  the last 72 hours.  Recent Labs  10/15/15 1948  HGBA1C 5.8*    Recent Labs  10/16/15 0515  CHOL 153  HDL 55  LDLCALC 85  TRIG 67  CHOLHDL 2.8   No results for input(s): TSH, T4TOTAL, T3FREE, THYROIDAB in the last 72 hours.  Invalid input(s): FREET3 No results for input(s): VITAMINB12, FOLATE, FERRITIN, TIBC, IRON, RETICCTPCT in the last 72 hours. Coags:  Recent Labs  10/15/15 1541  INR 1.04   Microbiology: No results found for this or any previous visit (from the past 240 hour(s)).   BRIEF HOSPITAL COURSE:   Left MCA infarct:still with some  expressive aphasia and mild right hemiparesis-but much improved than on admission.CVA likely embolic. Tele neg for Afib. Carotid doppler neg for significant stenosis. TEE showed a normal ejection fraction with no obvious thrombus, however had positive PFO, subsequently underwent lower extremity Dopplers that was negative for DVT. Seen by cardiology and underwent loop recorder implantation. A1c 5.8, LDL 85. Recommendations from neurology are to continue aspirin and statin. Seen by physical therapy-recommendations are for home health PT.   Dysphagia:secondary to above. Seen SLP-recommendations are for Dys 3 diet. Spoke with patient earlier this morning, explained aspiration risks-she is aware and accepting these risks-continue with dysphagia 3 diet.   Chronic kidney disease stage III - creatinine close to usual baseline-closely monitored in the outpatient setting  Hyperlipidemia - continue statins.  History of palpitations- continue propranolol  Hypertension- allow some permissive hypertension, continue propranolol on discharge  TODAY-DAY OF DISCHARGE:  Subjective:   Nicole Ruiz today has no headache,no chest abdominal pain,no new weakness tingling or numbness, feels much better wants to go home today.   Objective:   Blood pressure 135/72, pulse 54, temperature 97.8 F (36.6 C), temperature source Oral, resp. rate 16, height 5\' 4"  (1.626 m), weight 70.171 kg (154 lb 11.2 oz), SpO2 98 %. No intake or output data in the 24 hours ending 10/17/15 1415 Filed Weights   10/15/15 2300  Weight: 70.171 kg (154 lb 11.2 oz)    Exam Awake Alert, Oriented *3, No new F.N deficits, Normal affect [place].AT,PERRAL Supple Neck,No JVD, No cervical lymphadenopathy appriciated.  Symmetrical Chest wall movement, Good air movement bilaterally, CTAB RRR,No Gallops,Rubs or new Murmurs, No Parasternal Heave +ve B.Sounds, Abd Soft, Non tender, No organomegaly appriciated, No rebound -guarding or rigidity. No  Cyanosis, Clubbing or edema, No new Rash or bruise  DISCHARGE CONDITION: Stable  DISPOSITION: Home with home health services  DISCHARGE INSTRUCTIONS:    Activity:  As tolerated   Get Medicines reviewed and adjusted: Please take all your medications with you for your next visit with your Primary MD  Please request your Primary MD to go over all hospital tests and procedure/radiological results at the follow up, please ask your Primary MD to get all Hospital records sent to his/her office.  If you experience worsening of your admission symptoms, develop shortness of breath, life threatening emergency, suicidal or homicidal thoughts you must seek medical attention immediately by calling 911 or calling your MD immediately  if symptoms less severe.  You must read complete instructions/literature along with all the possible adverse reactions/side effects for all the Medicines you take and that have been prescribed to you. Take any new Medicines after you have completely understood and accpet all the possible adverse reactions/side effects.   Do not drive when taking Pain medications.   Do not take more than prescribed Pain, Sleep and Anxiety Medications  Special Instructions: If you  have smoked or chewed Tobacco  in the last 2 yrs please stop smoking, stop any regular Alcohol  and or any Recreational drug use.  Wear Seat belts while driving.  Please note  You were cared for by a hospitalist during your hospital stay. Once you are discharged, your primary care physician will handle any further medical issues. Please note that NO REFILLS for any discharge medications will be authorized once you are discharged, as it is imperative that you return to your primary care physician (or establish a relationship with a primary care physician if you do not have one) for your aftercare needs so that they can reassess your need for medications and monitor your lab values.   Diet recommendation: Heart  Healthy diet  Discharge Instructions    Ambulatory referral to Neurology    Complete by:  As directed      Call MD for:  extreme fatigue    Complete by:  As directed      Call MD for:  persistant dizziness or light-headedness    Complete by:  As directed      Diet - low sodium heart healthy    Complete by:  As directed   Dysphagia 3 diet     Increase activity slowly    Complete by:  As directed            Follow-up Information    Follow up with [facility] On 10/25/2015.   Why:  @ 9am for a wound check    Contact information:   [place] [address] [phone]      Follow up with Will Meredith Leeds, MD On 01/20/2016.   Specialty:  Cardiology   Why:  @ 9:30am   Contact information:   [place] [address] [phone]       Follow up with Antony Contras, MD.   Specialties:  Neurology, Radiology   Why:  Office will call you for an appointment-if he cannot hear from them-please give them a call   Contact information:   [place] [address] [phone]       Follow up with Phineas Inches, MD. Schedule an appointment as soon as possible for a visit in 2 weeks.   Specialty:  Family Medicine   Contact information:   [address] [place] [address] [phone]      Total Time spent on discharge equals 25 minutes.  SignedOren Binet 10/17/2015 2:15 PM

## 2015-10-17 NOTE — Interval H&P Note (Signed)
History and Physical Interval Note:  10/17/2015 11:01 AM  Nicole Ruiz  has presented today for surgery, with the diagnosis of stroke  The various methods of treatment have been discussed with the patient and family. After consideration of risks, benefits and other options for treatment, the patient has consented to  Procedure(s): TRANSESOPHAGEAL ECHOCARDIOGRAM (TEE) (N/A) as a surgical intervention .  The patient's history has been reviewed, patient examined, no change in status, stable for surgery.  I have reviewed the patient's chart and labs.  Questions were answered to the patient's satisfaction.     [name]

## 2015-10-17 NOTE — Progress Notes (Signed)
VASCULAR LAB PRELIMINARY  PRELIMINARY  PRELIMINARY  PRELIMINARY  Bilateral lower extremity venous duplex completed.    Preliminary report:  There is no DVT or SVT noted in the bilateral lower extremities.   Aamari West, RVT 10/17/2015, 1:29 PM

## 2015-10-17 NOTE — H&P (View-Only) (Signed)
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 CVA likelyembolic. Tele neg for Afib. Carotid doppler neg for significant stenosis. Echo pending. TEE and Loop recorder planned for tomorrow.LDL 85-not at goal (goal <70), A1C 5.8. Continue ASA and statin. PT recommending HHPT.  Dysphagia:secondary to above. Seen SLP-recommendations are for Dys 3 diet. Will speak with patient/family and make them aware of aspiration risks.  Chronic kidney disease stage III - closely monitor metabolic panel.  Hyperlipidemia - continue statins.  History of palpitations- on Inderal-continue telemetry monitoring.  Hypertension- allow some permissive hypertension, start anti-hypertensive in the next few days  Code Status: Full Family Communication: none at bedside Disposition Plan:  SNF when workup complete   Consultants:  Neurololgy  Procedures:  none  Antibiotics:  none  HPI/Subjective: Nicole Ruiz is a 74 y.o. Female with a history of hypertension, hyperlipidemia admitted through the ER on 10/18 for difficulty talking, right sided weakness for more than 36 hours, denied difficulty with swallowing, visual problems and any left sided weakness. MRI of the brain shows left MCA infarct, patient was beyond tPA window, patient admitted for further stoke workup. On exam patient still has difficulty breathing out words and mild right sided weakness, patient reports feeling better. Patient denies nausea, vomiting, headache , shortness of breath and loss of consciousness.   Objective: Filed Vitals:   10/16/15 0904  BP: 123/63  Pulse: 66  Temp: 97.3 F (36.3 C)  Resp: 16    Intake/Output Summary (Last 24 hours) at 10/16/15 1047 Last data filed at 10/16/15 0854  Gross per 24 hour  Intake    120 ml  Output      0 ml  Net     120 ml   Filed Weights   10/15/15 2300  Weight: 70.171 kg (154 lb 11.2 oz)    Exam:   General: Moderately built and nourished.  Eyes: Anicteric no pallor.  ENT: No discharge from the ears eyes nose and mouth.       Cardiovascular: S1-S2 heard.  Respiratory: No rhonchi or crepitations.  Abdomen: Soft nontender bowel sounds present.  Skin: No rash.  Musculoskeletal: No edema.  Psychiatric: Appears normal.  Neurologic: Alert awake oriented to time place and person. Mild right-sided weakness. Patient still has difficulty bringing out words. No facial asymmetry. Tongue is midline.    Data Reviewed: Basic Metabolic Panel:  Recent Labs Lab 10/15/15 1541 10/16/15 0515  NA 140 139  K 3.8 3.9  CL 110 107  CO2 22 23  GLUCOSE 106* 76  BUN 13 14  CREATININE 1.34* 1.15*  CALCIUM 9.4 8.7*   Liver Function Tests:  Recent Labs Lab 10/15/15 1541 10/16/15 0515  AST 23 19  ALT 16 13*  ALKPHOS 51 44  BILITOT 0.7 0.6  PROT 6.2* 5.6*  ALBUMIN 3.8 3.4*   No results for input(s): LIPASE, AMYLASE in the last 168 hours. No results for input(s): AMMONIA in the last 168 hours. CBC:  Recent Labs Lab 10/15/15 1541 10/16/15 0515  WBC 4.2 5.1  NEUTROABS 2.5  --   HGB 12.5 11.9*  HCT 37.4 34.8*  MCV 92.3 91.3  PLT 266 236   Cardiac Enzymes: No results for input(s): CKTOTAL, CKMB, CKMBINDEX, TROPONINI in the last 168 hours. BNP (last 3 results) No results for input(s): BNP in the last 8760 hours.  ProBNP (last 3  results) No results for input(s): PROBNP in the last 8760 hours.  CBG: No results for input(s): GLUCAP in the last 168 hours.  No results found for this or any previous visit (from the past 240 hour(s)).   Studies: Dg Chest 2 View  10/15/2015  CLINICAL DATA:  Acute onset of left-sided weakness. CVA. Initial encounter. EXAM: CHEST  2 VIEW COMPARISON:  Chest radiograph performed 05/28/2012 FINDINGS: The lungs are well-aerated. There is elevation of the  right hemidiaphragm. There is no evidence of focal opacification, pleural effusion or pneumothorax. The heart is normal in size; the mediastinal contour is within normal limits. No acute osseous abnormalities are seen. IMPRESSION: Elevation of the right hemidiaphragm.  Lungs remain grossly clear. Electronically Signed   By: Garald Balding M.D.   On: 10/15/2015 23:09   Ct Head Wo Contrast  10/15/2015  CLINICAL DATA:  Aphasia, RIGHT-sided weakness, onset of headache on Monday morning, hypertension, hyperlipidemia EXAM: CT HEAD WITHOUT CONTRAST TECHNIQUE: Contiguous axial images were obtained from the base of the skull through the vertex without intravenous contrast. COMPARISON:  None FINDINGS: Generalized atrophy. Normal ventricular morphology. No midline shift or mass effect. Minimal small vessel chronic ischemic changes of deep cerebral white matter. Old infarct LEFT cerebellar hemisphere. No intracranial hemorrhage, mass lesion or evidence acute infarction. No extra-axial fluid collections. Mucosal thickening throughout the ethmoid air cells, maxillary sinuses and sphenoid sinus. Small air-fluid level LEFT maxillary sinus. Mastoid air cells and middle ear cavities clear bilaterally. Osseous structures unremarkable. IMPRESSION: Small old LEFT cerebellar infarct. Atrophy with minimal small vessel chronic ischemic changes of deep cerebral white matter. No acute intracranial abnormalities. Scattered sinus disease changes as above with small air-fluid level LEFT maxillary sinus. Electronically Signed   By: Lavonia Dana M.D.   On: 10/15/2015 16:02   Mr Angiogram Head Wo Contrast  10/15/2015  CLINICAL DATA:  Aphasia and right-sided weakness beginning yesterday. Headache. EXAM: MRI HEAD WITHOUT CONTRAST MRA HEAD WITHOUT CONTRAST TECHNIQUE: Multiplanar, multiecho pulse sequences of the brain and surrounding structures were obtained without intravenous contrast. Angiographic images of the head were obtained using MRA  technique without contrast. COMPARISON:  Head CT 10/15/2015 and MRI 09/22/2010 FINDINGS: MRI HEAD FINDINGS There are multiple foci of acute cortical and subcortical infarction in the left MCA territory. The largest confluent focus measures approximately 3 cm and involves the posterior insula and parietal lobe/ posterior corona radiata. Separate smaller foci of acute infarction are present elsewhere in the left parietal and posterior left frontal lobes. There is no evidence of intracranial hemorrhage, mass, midline shift, or extra-axial fluid collection. Chronic infarcts are again seen in the left cerebellum and right corona radiata/ centrum semiovale. There is also a small, chronic medial left occipital lobe cortical infarct which is new from the prior MRI. There is mild global cerebral atrophy. Orbits are unremarkable. There is moderate mucosal thickening in the ethmoid air cells bilaterally. Mild bilateral maxillary sinus mucosal thickening and a small amount of left maxillary sinus fluid are noted. Mastoid air cells are clear. Major intracranial vascular flow voids are preserved. MRA HEAD FINDINGS Mildly to moderately motion degraded examination despite repeating the acquisition. The visualized distal vertebral arteries are patent without stenosis and with the left being mildly dominant. Basilar artery is patent without significant stenosis. ICAs are patent with mild-to-moderate irregular narrowing bilaterally. PCAs are patent with moderate branch vessel irregularity but no significant proximal stenosis. There is a small right posterior communicating artery with an infundibulum noted at its origin. Internal  carotid arteries are patent from skullbase to carotid termini without evidence of significant stenosis. M1 and A1 segments are patent without evidence of significant stenosis. There is prominent irregular attenuation of the left MCA M2 branches, greater posteriorly in the region of the acute infarct and with  likely occlusion of multiple small branch vessels. There is mild-to-moderate right MCA branch vessel attenuation as well. IMPRESSION: 1. Acute left MCA territory infarct. 2. Chronic infarcts as above. 3. No large vessel intracranial occlusion. Marked attenuation of left MCA M2 branch vessels/ subtotal occlusion of left M2 trunks with occlusion of more distal branch vessels. Electronically Signed   By: Logan Bores M.D.   On: 10/15/2015 18:58   Mr Brain Wo Contrast  10/15/2015  CLINICAL DATA:  Aphasia and right-sided weakness beginning yesterday. Headache. EXAM: MRI HEAD WITHOUT CONTRAST MRA HEAD WITHOUT CONTRAST TECHNIQUE: Multiplanar, multiecho pulse sequences of the brain and surrounding structures were obtained without intravenous contrast. Angiographic images of the head were obtained using MRA technique without contrast. COMPARISON:  Head CT 10/15/2015 and MRI 09/22/2010 FINDINGS: MRI HEAD FINDINGS There are multiple foci of acute cortical and subcortical infarction in the left MCA territory. The largest confluent focus measures approximately 3 cm and involves the posterior insula and parietal lobe/ posterior corona radiata. Separate smaller foci of acute infarction are present elsewhere in the left parietal and posterior left frontal lobes. There is no evidence of intracranial hemorrhage, mass, midline shift, or extra-axial fluid collection. Chronic infarcts are again seen in the left cerebellum and right corona radiata/ centrum semiovale. There is also a small, chronic medial left occipital lobe cortical infarct which is new from the prior MRI. There is mild global cerebral atrophy. Orbits are unremarkable. There is moderate mucosal thickening in the ethmoid air cells bilaterally. Mild bilateral maxillary sinus mucosal thickening and a small amount of left maxillary sinus fluid are noted. Mastoid air cells are clear. Major intracranial vascular flow voids are preserved. MRA HEAD FINDINGS Mildly to  moderately motion degraded examination despite repeating the acquisition. The visualized distal vertebral arteries are patent without stenosis and with the left being mildly dominant. Basilar artery is patent without significant stenosis. ICAs are patent with mild-to-moderate irregular narrowing bilaterally. PCAs are patent with moderate branch vessel irregularity but no significant proximal stenosis. There is a small right posterior communicating artery with an infundibulum noted at its origin. Internal carotid arteries are patent from skullbase to carotid termini without evidence of significant stenosis. M1 and A1 segments are patent without evidence of significant stenosis. There is prominent irregular attenuation of the left MCA M2 branches, greater posteriorly in the region of the acute infarct and with likely occlusion of multiple small branch vessels. There is mild-to-moderate right MCA branch vessel attenuation as well. IMPRESSION: 1. Acute left MCA territory infarct. 2. Chronic infarcts as above. 3. No large vessel intracranial occlusion. Marked attenuation of left MCA M2 branch vessels/ subtotal occlusion of left M2 trunks with occlusion of more distal branch vessels. Electronically Signed   By: Logan Bores M.D.   On: 10/15/2015 18:58    Scheduled Meds: . aspirin  300 mg Rectal Daily   Or  . aspirin  325 mg Oral Daily  . calcium carbonate  1 tablet Oral BID WC  . cholecalciferol  2,000 Units Oral Daily  . citalopram  20 mg Oral Daily  . enoxaparin (LOVENOX) injection  40 mg Subcutaneous QHS  . methocarbamol  750 mg Oral BID  . nortriptyline  25 mg Oral  QHS  . pantoprazole  40 mg Oral Daily  . pneumococcal 23 valent vaccine  0.5 mL Intramuscular Tomorrow-1000  . propranolol  10 mg Oral BID  . pyridOXINE  100 mg Oral Daily  . simvastatin  40 mg Oral QHS  . cyanocobalamin  100 mcg Oral Daily  . vitamin E  400 Units Oral Daily   Continuous Infusions: . sodium chloride 75 mL/hr (10/15/15  2308)    Principal Problem:   Left middle cerebral artery stroke [facility]) Active Problems:   Hypertension   CVA (cerebral infarction)   Hyperlipidemia   Stroke (cerebrum) ([place])   Time spent: 60 minutes  Lawernce Keas  PA-S [facility]  If 7PM-7AM, please contact night-coverage at [email or web address], password [facility] 10/16/2015, 10:47 AM  LOS: 1 day    Attending MD note  Patient was seen, examined,treatment plan was discussed with the PA-S.  I have personally reviewed the clinical findings, lab, imaging studies and management of this patient in detail. I agree with the documentation, as recorded by the PA-S.   74 year old female with history of dyslipidemia and hypertension-brought in for expressive aphasia and right hemiparesis. Found to have CVA-likely embolic in the MCA territory. Workup so far negative for any embolic source, telemetry negative for A. fib. Continue aspirin-spoke with neurology-recommendations are to pursue a TEE-and if needed a loop recorder. Rest as documented above.  [facility]

## 2015-10-17 NOTE — Progress Notes (Signed)
PT Cancellation Note  Patient Details Name: Nicole Ruiz MRN: [id number] DOB: 1941/05/20   Cancelled Treatment:    Reason Eval/Treat Not Completed: Patient at procedure or test/unavailable.  Pt currently off unit for test. Will check back as schedule allows.   Rolinda Roan 10/17/2015, 11:00 AM   Rolinda Roan, PT, DPT Acute Rehabilitation Services Pager: [phone]

## 2015-10-17 NOTE — Care Management Note (Signed)
Case Management Note  Patient Details  Name: Nicole Ruiz MRN: [id number] Date of Birth: Sep 30, 1941  Subjective/Objective:                    Action/Plan: Met with patient and husband to discuss discharge planning. Attending MD has ordered [facility] services at discharge. CM spoke with patient and husband, who are currently declining [facility] services at this time.  Patient was encouraged to let staff know if she changes her mind prior to discharge, and she was encouraged to contact her PCP if any HH needs arise after discharge.  Patient verbalized understanding.  Attending MD and bedside RN were updated.  Expected Discharge Date:                  Expected Discharge Plan:  Home/Self Care  In-House Referral:     Discharge planning Services  CM Consult  Post Acute Care Choice:    Choice offered to:  Patient, Spouse  DME Arranged:    DME Agency:     HH Arranged:    [place] [facility] Agency:     Status of Service:  Completed, signed off  Medicare Important Message Given:    Date Medicare IM Given:    Medicare IM give by:    Date Additional Medicare IM Given:    Additional Medicare Important Message give by:     If discussed at [place] of Stay Meetings, dates discussed:    Additional Comments:  Rolm Baptise, RN 10/17/2015, 1:45 PM

## 2015-10-17 NOTE — Consult Note (Signed)
ELECTROPHYSIOLOGY CONSULT NOTE  Patient ID: Nicole Ruiz MRN: [id number], DOB/AGE: 1941-07-19   Admit date: 10/15/2015 Date of Consult: 10/17/2015  Primary Physician: Phineas Inches, MD Primary Cardiologist: Dr. Minus Breeding  Reason for Consultation: Cryptogenic stroke; recommendations regarding Implantable Loop Recorder  History of Present Illness Nicole Ruiz is a 74 yo woman with a hx of HLD and HTN who was admitted on 10/15/2015 with aphasia and R sided weakness.  Imaging demonstrated: Left MCA infarct (MRI) and attenuation in L M2 with decreased branches (MRA).  she has undergone workup for stroke including echocardiogram and carotid dopplers.  The patient has been monitored on telemetry which has demonstrated sinus rhythm with no arrhythmias.  Inpatient stroke work-up is to be completed with a TEE this afternoon.   Echocardiogram this admission is still pending.  Lab work is remarkable for creat 1.15.   Prior to admission, the patient denies chest pain, shortness of breath, dizziness, palpitations, or syncope.  They are recovering from their stroke with plans to go home with spouse at discharge.  EP has been asked to evaluate for placement of an implantable loop recorder to monitor for atrial fibrillation.  ROS is negative except as outlined above.    Past Medical History  Diagnosis Date  . Hypertension   . Hyperlipidemia   . 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 Depression      Surgical History:  Past Surgical History  Procedure Laterality Date  . Knee surgery    . Abdominal hysterectomy    . Arm surgery    . Hemorroidectomy       Prescriptions prior to admission  Medication Sig Dispense Refill Last Dose  . aspirin EC 81 MG tablet Take 81 mg by mouth daily.   Past Week at Unknown time  . atorvastatin (LIPITOR) 40 MG tablet Take 40 mg by mouth daily.   Past Week at Unknown time  . Cholecalciferol (VITAMIN D3) 2000 UNITS TABS Take 1 tablet by mouth  daily.   10/14/2015 at Unknown time  . citalopram (CELEXA) 20 MG tablet Take 20 mg by mouth daily.   Past Week at Unknown time  . cyanocobalamin 1000 MCG tablet Take 100 mcg by mouth daily.   Past Week at Unknown time  . methocarbamol (ROBAXIN) 750 MG tablet Take 750 mg by mouth 2 (two) times daily.   10/16/2015 at Unknown time  . nabumetone (RELAFEN) 500 MG tablet Take 2,000 mg by mouth daily.    Past Week at Unknown time  . nortriptyline (PAMELOR) 25 MG capsule Take 25 mg by mouth at bedtime.   Past Week at Unknown time  . Omega-3 Fatty Acids (OMEGA 3 PO) Take 1 capsule by mouth daily.   Past Week at Unknown time  . omeprazole (PRILOSEC OTC) 20 MG tablet Take 20 mg by mouth daily.   Past Week at Unknown time  . OVER THE COUNTER MEDICATION Take 1 capsule by mouth daily. ( MEGA RED )   Past Week at Unknown time  . propranolol (INDERAL) 10 MG tablet Take 10 mg by mouth 2 (two) times daily.   10/14/2015 at Unknown time  . pyridOXINE (VITAMIN B-6) 100 MG tablet Take 100 mg by mouth daily.   Past Week at Unknown time  . simvastatin (ZOCOR) 40 MG tablet Take 40 mg by mouth at bedtime.   Past Week at Unknown time  . traZODone (DESYREL) 50 MG tablet Take 50 mg by mouth at bedtime.  Past Week at Unknown time    Inpatient Medications:  . aspirin  300 mg Rectal Daily   Or  . aspirin  325 mg Oral Daily  . calcium carbonate  1 tablet Oral BID WC  . cholecalciferol  2,000 Units Oral Daily  . citalopram  20 mg Oral Daily  . enoxaparin (LOVENOX) injection  40 mg Subcutaneous QHS  . methocarbamol  750 mg Oral BID  . nortriptyline  25 mg Oral QHS  . pantoprazole  40 mg Oral Daily  . pneumococcal 23 valent vaccine  0.5 mL Intramuscular Tomorrow-1000  . propranolol  10 mg Oral BID  . pyridOXINE  100 mg Oral Daily  . simvastatin  40 mg Oral QHS  . cyanocobalamin  100 mcg Oral Daily  . vitamin E  400 Units Oral Daily    Allergies: No Known Allergies  Social History   Social History  . Marital  Status: Married    Spouse Name: N/A  . Number of Children: N/A  . Years of Education: N/A   Occupational History  . Not on file.   Social History Main Topics  . Smoking status: Never Smoker   . Smokeless tobacco: Never Used  . Alcohol Use: No  . Drug Use: No  . Sexual Activity: Not on file   Other Topics Concern  . Not on file   Social History Narrative   Married.     Family History  Problem Relation Age of Onset  . Heart disease Mother 36  . Heart disease Brother 50     Physical Exam: Filed Vitals:   10/16/15 1650 10/16/15 2030 10/17/15 0125 10/17/15 0530  BP: 138/51 155/56 146/54 117/51  Pulse: 81 79 66 58  Temp: 98.3 F (36.8 C) 98 F (36.7 C) 98.1 F (36.7 C) 97.8 F (36.6 C)  TempSrc: Oral Oral Oral Oral  Resp: 20 20 18 18   Height:      Weight:      SpO2: 94% 95% 96% 95%    GEN- The patient is well appearing, alert and oriented x 3 today.  Flat affect Head- normocephalic, atraumatic Eyes-  Sclera clear, conjunctiva pink Ears- hearing intact Oropharynx- clear Neck- supple, Lungs- Clear to ausculation bilaterally, normal work of breathing Heart- Regular rate and rhythm, no murmurs, rubs or gallops, PMI not laterally displaced GI- soft, NT, ND, + BS Extremities- no clubbing, cyanosis, or edema MS- no significant deformity or atrophy Skin- no rash or lesion Psych- euthymic mood, full affect   Labs:   Lab Results  Component Value Date   WBC 5.1 10/16/2015   HGB 11.9* 10/16/2015   HCT 34.8* 10/16/2015   MCV 91.3 10/16/2015   PLT 236 10/16/2015    Recent Labs Lab 10/16/15 0515  NA 139  K 3.9  CL 107  CO2 23  BUN 14  CREATININE 1.15*  CALCIUM 8.7*  PROT 5.6*  BILITOT 0.6  ALKPHOS 44  ALT 13*  AST 19  GLUCOSE 76   Lab Results  Component Value Date   CKTOTAL 56 05/29/2012   CKMB 1.6 05/29/2012   TROPONINI <0.30 05/29/2012   Lab Results  Component Value Date   CHOL 153 10/16/2015   Lab Results  Component Value Date   HDL 55  10/16/2015   Lab Results  Component Value Date   LDLCALC 85 10/16/2015   Lab Results  Component Value Date   TRIG 67 10/16/2015   Lab Results  Component Value Date   CHOLHDL 2.8  10/16/2015   No results found for: LDLDIRECT  No results found for: DDIMER   Radiology/Studies: Dg Chest 2 View  10/15/2015  CLINICAL DATA:  Acute onset of left-sided weakness. CVA. Initial encounter. EXAM: CHEST  2 VIEW COMPARISON:  Chest radiograph performed 05/28/2012 FINDINGS: The lungs are well-aerated. There is elevation of the right hemidiaphragm. There is no evidence of focal opacification, pleural effusion or pneumothorax. The heart is normal in size; the mediastinal contour is within normal limits. No acute osseous abnormalities are seen. IMPRESSION: Elevation of the right hemidiaphragm.  Lungs remain grossly clear. Electronically Signed   By: Garald Balding M.D.   On: 10/15/2015 23:09   Ct Head Wo Contrast  10/15/2015  CLINICAL DATA:  Aphasia, RIGHT-sided weakness, onset of headache on Monday morning, hypertension, hyperlipidemia EXAM: CT HEAD WITHOUT CONTRAST TECHNIQUE: Contiguous axial images were obtained from the base of the skull through the vertex without intravenous contrast. COMPARISON:  None FINDINGS: Generalized atrophy. Normal ventricular morphology. No midline shift or mass effect. Minimal small vessel chronic ischemic changes of deep cerebral white matter. Old infarct LEFT cerebellar hemisphere. No intracranial hemorrhage, mass lesion or evidence acute infarction. No extra-axial fluid collections. Mucosal thickening throughout the ethmoid air cells, maxillary sinuses and sphenoid sinus. Small air-fluid level LEFT maxillary sinus. Mastoid air cells and middle ear cavities clear bilaterally. Osseous structures unremarkable. IMPRESSION: Small old LEFT cerebellar infarct. Atrophy with minimal small vessel chronic ischemic changes of deep cerebral white matter. No acute intracranial abnormalities.  Scattered sinus disease changes as above with small air-fluid level LEFT maxillary sinus. Electronically Signed   By: Lavonia Dana M.D.   On: 10/15/2015 16:02   Mr Angiogram Head Wo Contrast  10/15/2015  CLINICAL DATA:  Aphasia and right-sided weakness beginning yesterday. Headache. EXAM: MRI HEAD WITHOUT CONTRAST MRA HEAD WITHOUT CONTRAST TECHNIQUE: Multiplanar, multiecho pulse sequences of the brain and surrounding structures were obtained without intravenous contrast. Angiographic images of the head were obtained using MRA technique without contrast. COMPARISON:  Head CT 10/15/2015 and MRI 09/22/2010 FINDINGS: MRI HEAD FINDINGS There are multiple foci of acute cortical and subcortical infarction in the left MCA territory. The largest confluent focus measures approximately 3 cm and involves the posterior insula and parietal lobe/ posterior corona radiata. Separate smaller foci of acute infarction are present elsewhere in the left parietal and posterior left frontal lobes. There is no evidence of intracranial hemorrhage, mass, midline shift, or extra-axial fluid collection. Chronic infarcts are again seen in the left cerebellum and right corona radiata/ centrum semiovale. There is also a small, chronic medial left occipital lobe cortical infarct which is new from the prior MRI. There is mild global cerebral atrophy. Orbits are unremarkable. There is moderate mucosal thickening in the ethmoid air cells bilaterally. Mild bilateral maxillary sinus mucosal thickening and a small amount of left maxillary sinus fluid are noted. Mastoid air cells are clear. Major intracranial vascular flow voids are preserved. MRA HEAD FINDINGS Mildly to moderately motion degraded examination despite repeating the acquisition. The visualized distal vertebral arteries are patent without stenosis and with the left being mildly dominant. Basilar artery is patent without significant stenosis. ICAs are patent with mild-to-moderate irregular  narrowing bilaterally. PCAs are patent with moderate branch vessel irregularity but no significant proximal stenosis. There is a small right posterior communicating artery with an infundibulum noted at its origin. Internal carotid arteries are patent from skullbase to carotid termini without evidence of significant stenosis. M1 and A1 segments are patent without evidence of significant  stenosis. There is prominent irregular attenuation of the left MCA M2 branches, greater posteriorly in the region of the acute infarct and with likely occlusion of multiple small branch vessels. There is mild-to-moderate right MCA branch vessel attenuation as well. IMPRESSION: 1. Acute left MCA territory infarct. 2. Chronic infarcts as above. 3. No large vessel intracranial occlusion. Marked attenuation of left MCA M2 branch vessels/ subtotal occlusion of left M2 trunks with occlusion of more distal branch vessels. Electronically Signed   By: Logan Bores M.D.   On: 10/15/2015 18:58   Mr Brain Wo Contrast  10/15/2015  CLINICAL DATA:  Aphasia and right-sided weakness beginning yesterday. Headache. EXAM: MRI HEAD WITHOUT CONTRAST MRA HEAD WITHOUT CONTRAST TECHNIQUE: Multiplanar, multiecho pulse sequences of the brain and surrounding structures were obtained without intravenous contrast. Angiographic images of the head were obtained using MRA technique without contrast. COMPARISON:  Head CT 10/15/2015 and MRI 09/22/2010 FINDINGS: MRI HEAD FINDINGS There are multiple foci of acute cortical and subcortical infarction in the left MCA territory. The largest confluent focus measures approximately 3 cm and involves the posterior insula and parietal lobe/ posterior corona radiata. Separate smaller foci of acute infarction are present elsewhere in the left parietal and posterior left frontal lobes. There is no evidence of intracranial hemorrhage, mass, midline shift, or extra-axial fluid collection. Chronic infarcts are again seen in the  left cerebellum and right corona radiata/ centrum semiovale. There is also a small, chronic medial left occipital lobe cortical infarct which is new from the prior MRI. There is mild global cerebral atrophy. Orbits are unremarkable. There is moderate mucosal thickening in the ethmoid air cells bilaterally. Mild bilateral maxillary sinus mucosal thickening and a small amount of left maxillary sinus fluid are noted. Mastoid air cells are clear. Major intracranial vascular flow voids are preserved. MRA HEAD FINDINGS Mildly to moderately motion degraded examination despite repeating the acquisition. The visualized distal vertebral arteries are patent without stenosis and with the left being mildly dominant. Basilar artery is patent without significant stenosis. ICAs are patent with mild-to-moderate irregular narrowing bilaterally. PCAs are patent with moderate branch vessel irregularity but no significant proximal stenosis. There is a small right posterior communicating artery with an infundibulum noted at its origin. Internal carotid arteries are patent from skullbase to carotid termini without evidence of significant stenosis. M1 and A1 segments are patent without evidence of significant stenosis. There is prominent irregular attenuation of the left MCA M2 branches, greater posteriorly in the region of the acute infarct and with likely occlusion of multiple small branch vessels. There is mild-to-moderate right MCA branch vessel attenuation as well. IMPRESSION: 1. Acute left MCA territory infarct. 2. Chronic infarcts as above. 3. No large vessel intracranial occlusion. Marked attenuation of left MCA M2 branch vessels/ subtotal occlusion of left M2 trunks with occlusion of more distal branch vessels. Electronically Signed   By: Logan Bores M.D.   On: 10/15/2015 18:58    12-lead ECG  All prior EKG's in EPIC reviewed with no documented atrial fibrillation  Telemetry NSR with no afib  Assessment and Plan:  1.  Cryptogenic stroke The patient presents with cryptogenic stroke.  The patient has a TEE planned for this AM.  I spoke at length with the patient about monitoring for afib with either a 30 day event monitor or an implantable loop recorder.  Risks, benefits, and alteratives to implantable loop recorder were discussed with the patient today.  At this time, the patient is very clear in their decision  to proceed with implantable loop recorder.   Wound care was reviewed with the patient (keep incision clean and dry for 3 days).  Wound check scheduled for 10/25/15. Please call with questions.  Angelena Form PA-C 10/17/2015 8:20 AM    I have seen and examined this patient with Angelena Form.  Agree with above, note added to reflect my findings.  On exam, regular rhythm, no murmurs, lungs clear.  TEE showed PFO.  Due to that, Nicole Ruiz doppler legs to determine if she has a clot.  If not, Nicole Ruiz implant LINQ.  Discussed the procedure with the patient and she is agreeable should she need this device.    Namir Neto M. Larita Deremer MD 10/17/2015 12:16 PM

## 2015-10-17 NOTE — Progress Notes (Signed)
STROKE TEAM PROGRESS NOTE   HISTORY Nicole Ruiz is an 74 y.o. female with a past medical history significant for HTN, hyperlipidemia, depression, presents to the ED for evaluation of dysarthria, right sided weakness. She reports that she went to bed Sunday night feeling well but woke around around 1 am Monday and realized that the right arm and leg were not moving well, weak, and she couldn't go anywhere without holding to things around her room. Then, she became aware of having trouble expressing herself. Overall. Her symptoms have remained unchanged. Complains of HA, but denies vertigo, double vision, difficulty swallowing, focal weakness, or vision disturbances. CT brain showed no acute abnormality. MRI  revealED a moderate size infarct along the insular region, posterior frontal region, as well as smaller areas of acute infarct posterior left parietal area and CR. MRA brain: no large vessel intracranial occlusion. Marked attenuation of left MCA M2 branch vessels/ subtotal occlusion of left M2 trunks with occlusion of more distal branch vessels.  Patient was last known well 10/13/15 at 9:30 pm. Patient was not administered TPA secondary to delay in arrival. She was admitted for further evaluation and treatment.   SUBJECTIVE (INTERVAL HISTORY) Her husband is at the bedside.  Overall she feels her condition is gradually improving. She had TEE this morning which showed a small PFO but no definite cardiac source of embolism.    OBJECTIVE Temp:  [97.7 F (36.5 C)-98.3 F (36.8 C)] 97.8 F (36.6 C) (10/20 1353) Pulse Rate:  [51-81] 54 (10/20 1353) Cardiac Rhythm:  [-] Normal sinus rhythm (10/19 1951) Resp:  [11-20] 16 (10/20 1353) BP: (110-155)/(45-72) 135/72 mmHg (10/20 1353) SpO2:  [94 %-98 %] 98 % (10/20 1353)  CBC:   Recent Labs Lab 10/15/15 1541 10/16/15 0515  WBC 4.2 5.1  NEUTROABS 2.5  --   HGB 12.5 11.9*  HCT 37.4 34.8*  MCV 92.3 91.3  PLT 266 458    Basic Metabolic  Panel:   Recent Labs Lab 10/15/15 1541 10/16/15 0515  NA 140 139  K 3.8 3.9  CL 110 107  CO2 22 23  GLUCOSE 106* 76  BUN 13 14  CREATININE 1.34* 1.15*  CALCIUM 9.4 8.7*    Lipid Panel:     Component Value Date/Time   CHOL 153 10/16/2015 0515   TRIG 67 10/16/2015 0515   HDL 55 10/16/2015 0515   CHOLHDL 2.8 10/16/2015 0515   VLDL 13 10/16/2015 0515   LDLCALC 85 10/16/2015 0515   HgbA1c:  Lab Results  Component Value Date   HGBA1C 5.8* 10/15/2015   Urine Drug Screen: No results found for: LABOPIA, COCAINSCRNUR, LABBENZ, AMPHETMU, THCU, LABBARB    IMAGING  Dg Chest 2 View 10/15/2015   Elevation of the right hemidiaphragm.  Lungs remain grossly clear.   Ct Head Wo Contrast  10/15/2015  Small old LEFT cerebellar infarct. Atrophy with minimal small vessel chronic ischemic changes of deep cerebral white matter. No acute intracranial abnormalities. Scattered sinus disease changes as above with small air-fluid level LEFT maxillary sinus.   Mr Brain Wo Contrast 10/15/2015   1. Acute left MCA territory infarct. 2. Chronic infarcts    Mr Angiogram Head Wo Contrast 10/15/2015    3. No large vessel intracranial occlusion. Marked attenuation of left MCA M2 branch vessels/ subtotal occlusion of left M2 trunks with occlusion of more distal branch vessels.  Carotid Doppler  There is 1-39% bilateral ICA stenosis. Vertebral artery flow is antegrade.    PHYSICAL EXAM Pleasant elderly  Caucasian lady currently not in distress. . Afebrile. Head is nontraumatic. Neck is supple without bruit.    Cardiac exam no murmur or gallop. Lungs are clear to auscultation. Distal pulses are well felt. Neurological Exam :  Awake alert oriented 3. Minimal word finding difficulties and nonfluent speech. No paraphasic errors. Able to name well but has difficulty with repetition. Good comprehension. Follows three-step commands. Extraocular moments are full range without nystagmus. Fundi were not  visualized. Vision acuity and fields seem adequate. Right lower facial weakness. Tongue midline. Motor system exam mild right hemiparesis/5 strength with weakness of right grip and intrinsic hand muscles. In and weakness in right hip flexors and ankle dorsiflexors. Sensation appears preserved bilaterally coordination is slow but accurate. Gait was not tested. ASSESSMENT/PLAN [name] is a 74 y.o. female with history of hyperlipidemia, depression presenting with dysarthria, right sided weakness. She did not receive IV t-PA due to delay in arrival.   Stroke:  Dominant left MCA infarct embolic secondary to unknown source  Resultant  R hemiparesis, expressive aphasia   MRI  L MCA infarct  MRA  Attenuation L M2 with decreased branches  Carotid Doppler  No signficant stensois  2D Echo  pending   TEE  no left atrial clot but patent foramen ovale present with positive bubble study and atrial septal aneurysm.  LDL 85  HgbA1c 5.9  Lovenox 40 mg sq daily for VTE prophylaxis Diet - low sodium heart healthy  warfarin listed on Med red as taking prior to admission, however pt and husband unaware, now on aspirin 325 mg orally every day  Patient counseled to be compliant with her antithrombotic medications  Ongoing aggressive stroke risk factor management  Therapy recommendations:  pending  Disposition:  pending   Hypertension  Stable  Permissive hypertension (OK if < 220/120) but gradually normalize in 5-7 days  Hyperlipidemia  Home meds:  zocor 40, resumed in hospital  LDL 85, goal < 70  Continue statin at discharge  Other Stroke Risk Factors  Advanced age  Other Active Problems  Chronic kidney disease stage III  Hx palpitations on inderal  Hx CP, cath neg 2013 [facility])  Hospital day # [place] for Pager information 10/17/2015 3:13 PM  I have personally examined this patient, reviewed notes, independently  viewed imaging studies, participated in medical decision making and plan of care. I have made any additions or clarifications directly to the above note.  . I had a long discussion with the patient and husband at the bedside and answered questions. Continue aspirin for now and check LE venous dopplers and if negativem and possible loop recorder insertion   Antony Contras, MD Medical Director [facility] Pager: [phone] 10/17/2015 3:13 PM    To contact Stroke Continuity provider, please refer to [email or web address] After hours, contact General Neurology

## 2015-10-17 NOTE — Progress Notes (Signed)
Transport here to take patient for TEE. Patient refuses to go despite RN's attempts to educate.

## 2015-10-17 NOTE — Progress Notes (Signed)
   10/17/15 1100  OT Visit Information  Last OT Received On 10/17/15  Reason Eval/Treat Not Completed Patient at procedure or test/ unavailable  History of Present Illness Pt is a 74 y/o female with a PMH of HTN, who was brought to the ED with aphasia and R sided weakness (>36 hours). MRI revealed L MCA infarct.  OT to reattempt as schedule permits.   Tyrone Schimke OTR/L Pager: [phone]

## 2015-10-17 NOTE — Progress Notes (Signed)
SLP Cancellation Note  Patient Details Name: Nicole Ruiz MRN: [id number] DOB: 10/01/1941   Cancelled treatment:        Cancelled MBS, pt sedated and at procedure, will f/u next date.                                                                                                Lanier Ensign, Student-SLP  Lanier Ensign 10/17/2015, 11:14 AM

## 2015-10-17 NOTE — CV Procedure (Signed)
    TEE  CVA  Findings: +PFO, +bubble study, +atrial septal aneurysm (redundant septal wall, highly mobile)  Normal EF No thrombus  Discussed with Dr. Rayann Heman and EP team. Plan is to obtain LE dopplers if not already performed.    Candee Furbish, MD

## 2015-10-18 ENCOUNTER — Encounter (HOSPITAL_COMMUNITY): Payer: Self-pay | Admitting: Cardiology

## 2015-10-18 NOTE — Progress Notes (Signed)
Pt for discharge home today. Discharge orders received. IV and telemetry dcd with dressing clean dry and intact. Discharge instructions and prescriptions given with verbalized understanding. Family at bedside to assist with discharge. Staff brought patient to lobby via wheelchair at 1800. Transported to home by family member.

## 2015-10-19 ENCOUNTER — Encounter (HOSPITAL_COMMUNITY): Payer: Self-pay | Admitting: Emergency Medicine

## 2015-10-19 ENCOUNTER — Inpatient Hospital Stay (HOSPITAL_COMMUNITY): Payer: Medicare HMO

## 2015-10-19 ENCOUNTER — Inpatient Hospital Stay (HOSPITAL_COMMUNITY)
Admission: EM | Admit: 2015-10-19 | Discharge: 2015-10-22 | DRG: 480 | Disposition: A | Discharge status: Skilled Nursing Facility | Payer: Medicare HMO | Attending: Internal Medicine | Admitting: Internal Medicine

## 2015-10-19 ENCOUNTER — Emergency Department (HOSPITAL_COMMUNITY): Payer: Medicare HMO

## 2015-10-19 ENCOUNTER — Other Ambulatory Visit: Payer: Self-pay

## 2015-10-19 DIAGNOSIS — I639 Cerebral infarction, unspecified: Secondary | ICD-10-CM | POA: Diagnosis not present

## 2015-10-19 DIAGNOSIS — I63512 Cerebral infarction due to unspecified occlusion or stenosis of left middle cerebral artery: Secondary | ICD-10-CM | POA: Diagnosis present

## 2015-10-19 DIAGNOSIS — K219 Gastro-esophageal reflux disease without esophagitis: Secondary | ICD-10-CM | POA: Diagnosis not present

## 2015-10-19 DIAGNOSIS — S72401D Unspecified fracture of lower end of right femur, subsequent encounter for closed fracture with routine healing: Secondary | ICD-10-CM | POA: Diagnosis not present

## 2015-10-19 DIAGNOSIS — R002 Palpitations: Secondary | ICD-10-CM | POA: Diagnosis not present

## 2015-10-19 DIAGNOSIS — Z4509 Encounter for adjustment and management of other cardiac device: Secondary | ICD-10-CM | POA: Diagnosis not present

## 2015-10-19 DIAGNOSIS — E785 Hyperlipidemia, unspecified: Secondary | ICD-10-CM | POA: Diagnosis present

## 2015-10-19 DIAGNOSIS — Z79899 Other long term (current) drug therapy: Secondary | ICD-10-CM

## 2015-10-19 DIAGNOSIS — R1319 Other dysphagia: Secondary | ICD-10-CM | POA: Diagnosis present

## 2015-10-19 DIAGNOSIS — D62 Acute posthemorrhagic anemia: Secondary | ICD-10-CM | POA: Diagnosis not present

## 2015-10-19 DIAGNOSIS — R911 Solitary pulmonary nodule: Secondary | ICD-10-CM | POA: Diagnosis present

## 2015-10-19 DIAGNOSIS — Z7982 Long term (current) use of aspirin: Secondary | ICD-10-CM | POA: Diagnosis not present

## 2015-10-19 DIAGNOSIS — R918 Other nonspecific abnormal finding of lung field: Secondary | ICD-10-CM | POA: Insufficient documentation

## 2015-10-19 DIAGNOSIS — I129 Hypertensive chronic kidney disease with stage 1 through stage 4 chronic kidney disease, or unspecified chronic kidney disease: Secondary | ICD-10-CM | POA: Diagnosis present

## 2015-10-19 DIAGNOSIS — W19XXXD Unspecified fall, subsequent encounter: Secondary | ICD-10-CM | POA: Diagnosis not present

## 2015-10-19 DIAGNOSIS — I63312 Cerebral infarction due to thrombosis of left middle cerebral artery: Secondary | ICD-10-CM | POA: Diagnosis not present

## 2015-10-19 DIAGNOSIS — M9711XA Periprosthetic fracture around internal prosthetic right knee joint, initial encounter: Principal | ICD-10-CM | POA: Diagnosis present

## 2015-10-19 DIAGNOSIS — W1830XA Fall on same level, unspecified, initial encounter: Secondary | ICD-10-CM | POA: Diagnosis present

## 2015-10-19 DIAGNOSIS — R4701 Aphasia: Secondary | ICD-10-CM | POA: Diagnosis present

## 2015-10-19 DIAGNOSIS — I253 Aneurysm of heart: Secondary | ICD-10-CM | POA: Diagnosis present

## 2015-10-19 DIAGNOSIS — I69951 Hemiplegia and hemiparesis following unspecified cerebrovascular disease affecting right dominant side: Secondary | ICD-10-CM

## 2015-10-19 DIAGNOSIS — N183 Chronic kidney disease, stage 3 (moderate): Secondary | ICD-10-CM | POA: Diagnosis present

## 2015-10-19 DIAGNOSIS — Z90710 Acquired absence of both cervix and uterus: Secondary | ICD-10-CM | POA: Diagnosis not present

## 2015-10-19 DIAGNOSIS — Q211 Atrial septal defect: Secondary | ICD-10-CM | POA: Diagnosis not present

## 2015-10-19 DIAGNOSIS — S7291XS Unspecified fracture of right femur, sequela: Secondary | ICD-10-CM

## 2015-10-19 DIAGNOSIS — F329 Major depressive disorder, single episode, unspecified: Secondary | ICD-10-CM | POA: Diagnosis not present

## 2015-10-19 DIAGNOSIS — R2990 Unspecified symptoms and signs involving the nervous system: Secondary | ICD-10-CM

## 2015-10-19 DIAGNOSIS — I638 Other cerebral infarction: Secondary | ICD-10-CM | POA: Diagnosis not present

## 2015-10-19 DIAGNOSIS — S7291XA Unspecified fracture of right femur, initial encounter for closed fracture: Secondary | ICD-10-CM | POA: Diagnosis present

## 2015-10-19 DIAGNOSIS — W19XXXA Unspecified fall, initial encounter: Secondary | ICD-10-CM | POA: Diagnosis not present

## 2015-10-19 DIAGNOSIS — Y92002 Bathroom of unspecified non-institutional (private) residence single-family (private) house as the place of occurrence of the external cause: Secondary | ICD-10-CM | POA: Diagnosis not present

## 2015-10-19 DIAGNOSIS — I10 Essential (primary) hypertension: Secondary | ICD-10-CM | POA: Diagnosis present

## 2015-10-19 DIAGNOSIS — T148XXA Other injury of unspecified body region, initial encounter: Secondary | ICD-10-CM

## 2015-10-19 HISTORY — DX: Chronic kidney disease, unspecified: N18.9

## 2015-10-19 HISTORY — DX: Cerebral infarction, unspecified: I63.9

## 2015-10-19 LAB — DIFFERENTIAL
BASOS PCT: 0 %
Basophils Absolute: 0 10*3/uL (ref 0.0–0.1)
EOS ABS: 0.2 10*3/uL (ref 0.0–0.7)
EOS PCT: 4 %
Lymphocytes Relative: 26 %
Lymphs Abs: 1.2 10*3/uL (ref 0.7–4.0)
MONO ABS: 0.3 10*3/uL (ref 0.1–1.0)
MONOS PCT: 6 %
Neutro Abs: 2.9 10*3/uL (ref 1.7–7.7)
Neutrophils Relative %: 64 %

## 2015-10-19 LAB — APTT: aPTT: 23 seconds — ABNORMAL LOW (ref 24–37)

## 2015-10-19 LAB — COMPREHENSIVE METABOLIC PANEL
ALT: 36 U/L (ref 14–54)
AST: 58 U/L — ABNORMAL HIGH (ref 15–41)
Albumin: 3.6 g/dL (ref 3.5–5.0)
Alkaline Phosphatase: 53 U/L (ref 38–126)
Anion gap: 14 (ref 5–15)
BUN: 12 mg/dL (ref 6–20)
CHLORIDE: 102 mmol/L (ref 101–111)
CO2: 20 mmol/L — ABNORMAL LOW (ref 22–32)
CREATININE: 1.25 mg/dL — AB (ref 0.44–1.00)
Calcium: 9.2 mg/dL (ref 8.9–10.3)
GFR, EST AFRICAN AMERICAN: 48 mL/min — AB (ref >60)
GFR, EST NON AFRICAN AMERICAN: 41 mL/min — AB (ref >60)
Glucose, Bld: 115 mg/dL — ABNORMAL HIGH (ref 65–99)
POTASSIUM: 4.2 mmol/L (ref 3.5–5.1)
Sodium: 136 mmol/L (ref 135–145)
Total Bilirubin: 0.9 mg/dL (ref 0.3–1.2)
Total Protein: 6.1 g/dL — ABNORMAL LOW (ref 6.5–8.1)

## 2015-10-19 LAB — I-STAT CHEM 8, ED
BUN: 12 mg/dL (ref 6–20)
CALCIUM ION: 1.12 mmol/L — AB (ref 1.13–1.30)
Chloride: 106 mmol/L (ref 101–111)
Creatinine, Ser: 1.1 mg/dL — ABNORMAL HIGH (ref 0.44–1.00)
GLUCOSE: 111 mg/dL — AB (ref 65–99)
HCT: 39 % (ref 36.0–46.0)
Hemoglobin: 13.3 g/dL (ref 12.0–15.0)
Potassium: 4.3 mmol/L (ref 3.5–5.1)
SODIUM: 139 mmol/L (ref 135–145)
TCO2: 18 mmol/L (ref 0–100)

## 2015-10-19 LAB — I-STAT TROPONIN, ED: TROPONIN I, POC: 0.01 ng/mL (ref 0.00–0.08)

## 2015-10-19 LAB — CBC
HEMATOCRIT: 38.5 % (ref 36.0–46.0)
Hemoglobin: 12.9 g/dL (ref 12.0–15.0)
MCH: 30.4 pg (ref 26.0–34.0)
MCHC: 33.5 g/dL (ref 30.0–36.0)
MCV: 90.6 fL (ref 78.0–100.0)
Platelets: 227 10*3/uL (ref 150–400)
RBC: 4.25 MIL/uL (ref 3.87–5.11)
RDW: 13 % (ref 11.5–15.5)
WBC: 4.5 10*3/uL (ref 4.0–10.5)

## 2015-10-19 LAB — ETHANOL

## 2015-10-19 LAB — PROTIME-INR
INR: 1.03 (ref 0.00–1.49)
Prothrombin Time: 13.7 seconds (ref 11.6–15.2)

## 2015-10-19 MED ORDER — ATORVASTATIN CALCIUM 40 MG PO TABS
40.0000 mg | ORAL_TABLET | Freq: Every day | ORAL | Status: DC
  Administered 2015-10-20 – 2015-10-21 (×2): 40 mg via ORAL
  Filled 2015-10-19 (×2): qty 1

## 2015-10-19 MED ORDER — ASPIRIN 325 MG PO TABS
325.0000 mg | ORAL_TABLET | Freq: Every day | ORAL | Status: DC
  Administered 2015-10-19: 325 mg via ORAL
  Filled 2015-10-19 (×2): qty 1

## 2015-10-19 MED ORDER — STROKE: EARLY STAGES OF RECOVERY BOOK
Freq: Once | Status: AC
  Administered 2015-10-20: 07:00:00
  Filled 2015-10-19: qty 1

## 2015-10-19 MED ORDER — ACETAMINOPHEN 650 MG RE SUPP
650.0000 mg | RECTAL | Status: DC | PRN
  Filled 2015-10-19: qty 1

## 2015-10-19 MED ORDER — OXYCODONE HCL 5 MG PO TABS
5.0000 mg | ORAL_TABLET | Freq: Four times a day (QID) | ORAL | Status: DC | PRN
  Administered 2015-10-20: 5 mg via ORAL
  Filled 2015-10-19: qty 1

## 2015-10-19 MED ORDER — SODIUM CHLORIDE 0.9 % IV SOLN
INTRAVENOUS | Status: DC
  Administered 2015-10-19: via INTRAVENOUS

## 2015-10-19 MED ORDER — ASPIRIN 300 MG RE SUPP: 300.0000 mg | Freq: Every day | RECTAL | Status: DC

## 2015-10-19 MED ORDER — MORPHINE SULFATE (PF) 2 MG/ML IV SOLN
1.0000 mg | INTRAVENOUS | Status: DC | PRN
  Administered 2015-10-19 – 2015-10-20 (×2): 2 mg via INTRAVENOUS
  Filled 2015-10-19 (×2): qty 1

## 2015-10-19 MED ORDER — NORTRIPTYLINE HCL 25 MG PO CAPS
25.0000 mg | ORAL_CAPSULE | Freq: Every day | ORAL | Status: DC
  Administered 2015-10-19 – 2015-10-21 (×3): 25 mg via ORAL
  Filled 2015-10-19 (×3): qty 1

## 2015-10-19 MED ORDER — HYDROMORPHONE HCL 1 MG/ML IJ SOLN
1.0000 mg | Freq: Once | INTRAMUSCULAR | Status: AC
  Administered 2015-10-19: 1 mg via INTRAVENOUS
  Filled 2015-10-19: qty 1

## 2015-10-19 MED ORDER — ENOXAPARIN SODIUM 40 MG/0.4ML ~~LOC~~ SOLN
40.0000 mg | Freq: Every day | SUBCUTANEOUS | Status: DC
  Administered 2015-10-19: 40 mg via SUBCUTANEOUS
  Filled 2015-10-19: qty 0.4

## 2015-10-19 MED ORDER — ACETAMINOPHEN 325 MG PO TABS: 650.0000 mg | ORAL_TABLET | ORAL | Status: DC | PRN

## 2015-10-19 MED ORDER — FENTANYL CITRATE (PF) 100 MCG/2ML IJ SOLN: 50.0000 ug | Freq: Once | INTRAMUSCULAR | Status: DC

## 2015-10-19 NOTE — ED Notes (Signed)
Received pt from home with c/o ems called out for fall. Pt had unwitnessed fall with possible + LOC. Pt c/o Right knee and hip pain and Speech difficulty. Pt had CVA on Monday with right sided deficits, per family speech difficulty is new.[name]

## 2015-10-19 NOTE — H&P (Signed)
PATIENT DETAILS Name: Nicole Ruiz Age: 74 y.o. Sex: female Date of Birth: 12/25/1941 Admit Date: 10/19/2015 GUY:[id number] E, MD Referring Physician:Dr Nanavati   CHIEF COMPLAINT:  Fall with pain in the right hip and right knee area  HPI: Nicole Ruiz is a 74 y.o. 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 Patient is not sure whether she lost consciousness-however claims that she was likely aware of all her surroundings during this episode. After she sustained the fall-she had excruciating pain in her right knee. She also claims that following the fall she had worsening of her expressive aphasia. She was subsequently brought to the emergency room for further evaluation-CT of the head was negative for acute abnormalities. X-ray of the right hip was negative for any fracture. I was subsequently asked to admit this patient for further evaluation and treatment.  During my evaluation-her expressive aphasia seems to have improved compared to initial presentation that has been described in the chart. This M.D. discharged at this patient 2 days ago-her expressive aphasia currently is not that different from what it was 2 days ago. Currently her main complaint is pain in her right knee and right hip area-she is able to move her right lower extremity side-by-side-but unable to lift against gravity because of pain.   She denies any chest pain, shortness of breath or palpitations prior or during the syncopal episode.   ALLERGIES:  No Known Allergies  PAST MEDICAL HISTORY: Past Medical History  Diagnosis Date  . Hypertension   . Hyperlipidemia   . 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 Depression   . Stroke [facility])     PAST SURGICAL HISTORY: Past Surgical History  Procedure Laterality Date  . Knee surgery    . Abdominal hysterectomy    . Arm surgery    . Hemorroidectomy    . Ep implantable device N/A 10/17/2015    Procedure: Loop Recorder Insertion;  Surgeon: Will Meredith Leeds, MD;  Location: [place] [facility];  Service: Cardiovascular;  Laterality: N/A;  . Tee without cardioversion N/A 10/17/2015    Procedure: TRANSESOPHAGEAL ECHOCARDIOGRAM (TEE);  Surgeon: Jerline Pain, MD;  Location: [place];  Service: Cardiovascular;  Laterality: N/A;    MEDICATIONS AT HOME: Prior to Admission medications   Medication Sig Start Date End Date Taking? Authorizing Provider  aspirin EC 325 MG EC tablet Take 1 tablet (325 mg total) by mouth daily. Patient taking differently: Take 325 mg by mouth at bedtime.  10/17/15  Yes Nohea Kras Kristeen Mans, MD  atorvastatin (LIPITOR) 40 MG tablet Take 1 tablet (40 mg total) by mouth daily. Patient taking differently: Take 40 mg by mouth at bedtime.  10/17/15  Yes Naod Sweetland Kristeen Mans, MD  Cholecalciferol (VITAMIN D3) 2000 UNITS TABS Take 2,000 Units by mouth daily.    Yes Historical Provider, MD  citalopram (CELEXA) 20 MG tablet Take 20 mg by mouth daily.   Yes Historical Provider, MD  feeding supplement, ENSURE ENLIVE, (ENSURE ENLIVE) LIQD Take 237 mLs by mouth daily as needed (Please offer if meal completion is less than 50%). 10/17/15  Yes Xavius Spadafore Kristeen Mans, MD  KRILL OIL PO Take 1 capsule by mouth daily. Mega Red  Yes Historical Provider, MD  methocarbamol (ROBAXIN) 750 MG tablet Take 750 mg by mouth 2 (two) times daily.   Yes Historical Provider, MD  nabumetone (RELAFEN) 500 MG tablet Take 1,000 mg by mouth 2 (two) times daily.    Yes Historical Provider, MD  nortriptyline (PAMELOR) 25 MG capsule Take 25 mg by mouth at bedtime.   Yes Historical Provider, MD  omeprazole (PRILOSEC OTC) 20 MG tablet Take 20 mg by mouth daily.    Yes Historical Provider, MD  polyvinyl alcohol (ARTIFICIAL TEARS) 1.4 % ophthalmic solution Place 1 drop into both eyes every hour as needed for dry eyes.   Yes Historical Provider, MD  propranolol (INDERAL) 10 MG tablet Take 10 mg by mouth 2 (two) times daily.   Yes Historical Provider, MD  pyridOXINE (VITAMIN B-6) 100 MG tablet Take 100 mg by mouth daily.   Yes Historical Provider, MD    FAMILY HISTORY: Family History  Problem Relation Age of Onset  . Heart disease Mother 39  . Heart disease Brother 28    SOCIAL HISTORY:  reports that she has never smoked. She has never used smokeless tobacco. She reports that she does not drink alcohol or use illicit drugs. Lives at: Home Mobility:Independent  REVIEW OF SYSTEMS:  Constitutional:   No  weight loss, night sweats,  Fevers, chills, fatigue.  HEENT:    No headaches, Dysphagia,Tooth/dental problems,Sore throat,  No sneezing, itching, ear ache, nasal congestion, post nasal drip  Cardio-vascular: No chest pain,Orthopnea, PND,lower extremity edema, anasarca, palpitations  GI:  No heartburn, indigestion, abdominal pain, nausea, vomiting, diarrhea, melena or hematochezia  Resp: No shortness of breath, cough, hemoptysis,plueritic chest pain.   Skin:  No rash or lesions.  GU:  No dysuria, change in color of urine, no urgency or frequency.  No flank pain.  Musculoskeletal: No joint pain or swelling.  No decreased range of motion.  No back pain.  Endocrine: No heat intolerance, no cold intolerance, no polyuria, no polydipsia  Psych: No change in mood or affect. No depression or anxiety.  No memory loss.   PHYSICAL EXAM: Blood pressure 129/78, pulse 70, temperature 97.5 F (36.4 C), temperature source Oral, resp. rate 18, SpO2 100 %.  General appearance :Awake, alert, not in any distress. Mild expressive aphasia. Not toxic Looking HEENT: Atraumatic and Normocephalic, pupils equally reactive to light and accomodation Neck:  supple, no JVD. No cervical lymphadenopathy.  Chest:Good air entry bilaterally, no added sounds  CVS: S1 S2 regular, no murmurs.  Abdomen: Bowel sounds present, Non tender and not distended with no gaurding, rigidity or rebound. Extremities: B/L Lower Ext shows no edema, both legs are warm to touch Neurology:  Bilateral upper extremity-5/5. Left lower extremity 5/5. Right lower extremity-unable to be properly evaluated because of significant pain-but easily able to move side-by-side-not able to lift leg against gravity because of pain. Skin:No Rash Wounds:N/A  LABS ON ADMISSION:   Recent Labs  10/19/15 1551 10/19/15 1553  NA 139 136  K 4.3 4.2  CL 106 102  CO2  --  20*  GLUCOSE 111* 115*  BUN 12 12  CREATININE 1.10* 1.25*  CALCIUM  --  9.2    Recent Labs  10/19/15 1553  AST 58*  ALT 36  ALKPHOS 53  BILITOT 0.9  PROT 6.1*  ALBUMIN 3.6   No results for input(s): LIPASE, AMYLASE in the last 72 hours.  Recent Labs  10/19/15 1551 10/19/15 1553  WBC  --  4.5  NEUTROABS  --  2.9  HGB 13.3 12.9  HCT 39.0 38.5  MCV  --  90.6  PLT  --  227   No results for input(s): CKTOTAL, CKMB, CKMBINDEX, TROPONINI in the last 72 hours. No results for input(s): DDIMER in the last 72 hours. Invalid input(s): POCBNP   RADIOLOGIC STUDIES ON ADMISSION: Ct Head Wo Contrast  10/19/2015  ADDENDUM REPORT: 10/19/2015 16:10 ADDENDUM: Critical Value/emergent results were called by telephone at the time of interpretation on 10/19/2015 at 4:09 pm to Dr. Nicole Kindred , who verbally acknowledged these results. Electronically Signed   By: Marijo Conception, M.D.   On: 10/19/2015 16:10  10/19/2015  CLINICAL DATA:  Aphasia.  Status post fall. EXAM: CT HEAD WITHOUT CONTRAST TECHNIQUE: Contiguous axial images were obtained from the base of the skull through the vertex without intravenous contrast. COMPARISON:  CT scan of October 15, 2015. FINDINGS: Bony calvarium appears intact. Mild diffuse cortical atrophy  is noted. Mild chronic ischemic white matter disease is noted. Old left cerebellar infarction is again noted. No mass effect or midline shift is noted. Ventricular size is within normal limits. There is no evidence of mass lesion, hemorrhage or acute infarction. IMPRESSION: Mild diffuse cortical atrophy. Mild chronic ischemic white matter disease. Old left cerebellar infarction. No acute intracranial abnormality seen. Electronically Signed: By: Marijo Conception, M.D. On: 10/19/2015 16:04   Ct Cervical Spine Wo Contrast  10/19/2015  CLINICAL DATA:  Status post fall, neck pain EXAM: CT CERVICAL SPINE WITHOUT CONTRAST TECHNIQUE: Multidetector CT imaging of the cervical spine was performed without intravenous contrast. Multiplanar CT image reconstructions were also generated. COMPARISON:  None. FINDINGS: Mild straightening of the cervical spine, likely positional. No evidence of fracture or dislocation. Vertebral body heights are maintained. Dens appears intact. No prevertebral soft tissue swelling. Mild to moderate multilevel degenerative changes. Visualized thyroid is unremarkable. Visualized lung apices are notable for a 4 mm nodule on the right (series 201/ image 81). IMPRESSION: No evidence of traumatic injury to the cervical spine. Mild to moderate degenerative changes. 4 mm pulmonary nodule in the right lung apex. If this patient is high risk for primary bronchogenic neoplasm, a single follow-up CT chest is suggested in 1 year. If low risk, no dedicated follow-up imaging is required. Electronically Signed   By: Julian Hy M.D.   On: 10/19/2015 17:40   Dg Hip Unilat With Pelvis 2-3 Views Right  10/19/2015  CLINICAL DATA:  Severe pain status post unwitnessed fall EXAM: DG HIP (WITH OR WITHOUT PELVIS) 2-3V RIGHT COMPARISON:  08/05/2015 FINDINGS: No fracture or dislocation is seen. Mild degenerative changes. Visualized bony pelvis appears intact. IMPRESSION: No fracture or dislocation is seen.  Electronically Signed   By: Julian Hy M.D.   On: 10/19/2015 17:27    I have personally reviewed images of chest xray or the CT head   EKG: Personally reviewed. Sinus rhythm-but artifacts  ASSESSMENT AND PLAN: Present on Admission:  . Fall: Not sure whether this is a mechanical fall or a syncopal episode. Will need loop recorder to be interrogated, monitor in telemetry. Have asked RN to see if we can have the charge RN interrogate the loop recorder. If episodes of atrial fibrillation are recorded-may need to start anticoagulation. Will need a PT evaluation once CT of the pelvis and x-rays of the right hip obtained and are negative for fractures. Please note-during her most recent hospitalization, she refused home health services.   Addendum 7 pm: Spoke with Mitch from [place] recorder interrogated-no  significant events noted   . Right knee/right hip pain: This is following a fall. X-ray of the right hip negative for fractures. Patient is currently pending x-ray of the right knee and  CT of the pelvis. If this shows a fracture, orthopedic evaluation will be needed. Bedrest until then. Supportive care with as needed narcotics.  . ? Syncope/worsening expressive aphasia: Apparently patient had worsening expressive aphasia after she sustained the fall-but seems to have improved-and now back to her baseline of 2 days back. CT head shows no acute abnormalities. Given concern for extension of her recent CVA-will check MRI brain.  [name] Hypertension: Hold antihypertensives-allow permissive hypertension.   . Palpitations: Monitor in telemetry-loop recorder interrogation planned.   . Dyslipidemia: Continue statin   . CKD stage III: Creatinine close to usual baseline  . Nodule of right lung: Seen on CT scan of the cervical spine-will need follow-up as outpatient  . Dysphagia:secondary to above. Seen SLP-recommendations during her most recent hospitalization-once she passes a RN swallow  screen-have placed care instruction to RN to start a dysphagia 3 diet.   Further plan will depend as patient's clinical course evolves and further radiologic and laboratory data become available. Patient will be monitored closely.  Please note above plan was formulated after personal review and summarization of most recent inpatient/outpatient records.    Above noted plan was discussed with patient/spouse face to face at bedside, they were in agreement.   CONSULTS: Neurology  DVT Prophylaxis: Prophylactic Lovenox  Code Status: Full Code  Disposition Plan:  Discharge back home vs SNF possibly in 2-3 days  Total time spent  55 minutes.Greater than 50% of this time was spent in counseling, explanation of diagnosis, planning of further management, and coordination of care.  [facility] Pager [phone]  If 7PM-7AM, please contact night-coverage [email or web address] Password [facility] 10/19/2015, 6:58 PM

## 2015-10-19 NOTE — ED Notes (Signed)
Patient transported to X-ray 

## 2015-10-19 NOTE — ED Provider Notes (Signed)
CSN: [id number]     Arrival date & time 10/19/15  1540 History   First MD Initiated Contact with Patient 10/19/15 1547     Chief Complaint  Patient presents with  . Code Stroke    @EDPCLEARED @ (Consider location/radiation/quality/duration/timing/severity/associated sxs/prior Treatment) HPI Comments: Pt comes in with cc of fall. Pt has a hx of recent MCA stroke. She was discharged home just couple of days back. Reports that she was improving, but today, she started having same symptoms as her stroke - headache and speech difficulty with dizziness. She had a fall at home. Pt c/o R knee pain and bilateral hip pain from the fall. No pain elsewhere. Pt arrives as a code stroke -which was activated on the field.   ROS 10 Systems reviewed and are negative for acute change except as noted in the HPI.      The history is provided by the patient.    Past Medical History  Diagnosis Date  . Hypertension   . Hyperlipidemia   . Cancer of female genitourinary tract ([place])   . Depression   . Stroke [facility])    Past Surgical History  Procedure Laterality Date  . Knee surgery    . Abdominal hysterectomy    . Arm surgery    . Hemorroidectomy    . Ep implantable device N/A 10/17/2015    Procedure: Loop Recorder Insertion;  Surgeon: Will Meredith Leeds, MD;  Location: [place] [facility];  Service: Cardiovascular;  Laterality: N/A;  . Tee without cardioversion N/A 10/17/2015    Procedure: TRANSESOPHAGEAL ECHOCARDIOGRAM (TEE);  Surgeon: Jerline Pain, MD;  Location: [facility];  Service: Cardiovascular;  Laterality: N/A;   Family History  Problem Relation Age of Onset  . Heart disease Mother 65  . Heart disease Brother 12   Social History  Substance Use Topics  . Smoking status: Never Smoker   . Smokeless tobacco: Never Used  . Alcohol Use: No   OB History    No data available     Review of Systems    Allergies  Review of patient's allergies indicates no known  allergies.  Home Medications   Prior to Admission medications   Medication Sig Start Date End Date Taking? Authorizing Provider  aspirin EC 325 MG EC tablet Take 1 tablet (325 mg total) by mouth daily. Patient taking differently: Take 325 mg by mouth at bedtime.  10/17/15  Yes Shanker Kristeen Mans, MD  atorvastatin (LIPITOR) 40 MG tablet Take 1 tablet (40 mg total) by mouth daily. Patient taking differently: Take 40 mg by mouth at bedtime.  10/17/15  Yes Shanker Kristeen Mans, MD  Cholecalciferol (VITAMIN D3) 2000 UNITS TABS Take 2,000 Units by mouth daily.    Yes Historical Provider, MD  citalopram (CELEXA) 20 MG tablet Take 20 mg by mouth daily.   Yes Historical Provider, MD  feeding supplement, ENSURE ENLIVE, (ENSURE ENLIVE) LIQD Take 237 mLs by mouth daily as needed (Please offer if meal completion is less than 50%). 10/17/15  Yes Shanker Kristeen Mans, MD  KRILL OIL PO Take 1 capsule by mouth daily. Mega Red   Yes Historical Provider, MD  methocarbamol (ROBAXIN) 750 MG tablet Take 750 mg by mouth 2 (two) times daily.   Yes Historical Provider, MD  nabumetone (RELAFEN) 500 MG tablet Take 1,000 mg by mouth 2 (two) times daily.    Yes Historical Provider, MD  nortriptyline (PAMELOR) 25 MG capsule Take 25 mg by mouth at bedtime.   Yes Historical  Provider, MD  omeprazole (PRILOSEC OTC) 20 MG tablet Take 20 mg by mouth daily.   Yes Historical Provider, MD  polyvinyl alcohol (ARTIFICIAL TEARS) 1.4 % ophthalmic solution Place 1 drop into both eyes every hour as needed for dry eyes.   Yes Historical Provider, MD  propranolol (INDERAL) 10 MG tablet Take 10 mg by mouth 2 (two) times daily.   Yes Historical Provider, MD  pyridOXINE (VITAMIN B-6) 100 MG tablet Take 100 mg by mouth daily.   Yes Historical Provider, MD   BP 130/77 mmHg  Pulse 76  Temp(Src) 97.5 F (36.4 C) (Oral)  Resp 19  SpO2 100% Physical Exam  Constitutional: She is oriented to person, place, and time. She appears well-developed.  HENT:   Head: Normocephalic and atraumatic.  Eyes: EOM are normal.  Neck: Normal range of motion. Neck supple.  Cardiovascular: Normal rate.   Pulmonary/Chest: Effort normal.  Abdominal: Bowel sounds are normal.  Musculoskeletal:  Pt has bilateral hip tenderness and R distal femur tenderness.  OTHERWISE:  Head to toe evaluation shows no hematoma, bleeding of the scalp, no facial abrasions, step offs, crepitus, no tenderness to palpation of the bilateral upper and lower extremities, no gross deformities, no chest tenderness, no pelvic pain.   Neurological: She is alert and oriented to person, place, and time. Coordination normal.  + aphasia, no dysarthria   Skin: Skin is warm and dry.  Nursing note and vitals reviewed.   ED Course  Procedures (including critical care time) Labs Review Labs Reviewed  APTT - Abnormal; Notable for the following:    aPTT 23 (*)    All other components within normal limits  COMPREHENSIVE METABOLIC PANEL - Abnormal; Notable for the following:    CO2 20 (*)    Glucose, Bld 115 (*)    Creatinine, Ser 1.25 (*)    Total Protein 6.1 (*)    AST 58 (*)    GFR calc non Af Amer 41 (*)    GFR calc Af Amer 48 (*)    All other components within normal limits  I-STAT CHEM 8, ED - Abnormal; Notable for the following:    Creatinine, Ser 1.10 (*)    Glucose, Bld 111 (*)    Calcium, Ion 1.12 (*)    All other components within normal limits  ETHANOL  PROTIME-INR  CBC  DIFFERENTIAL  URINE RAPID DRUG SCREEN, HOSP PERFORMED  URINALYSIS, ROUTINE W REFLEX MICROSCOPIC (NOT AT [facility])  Randolm Idol, ED    Imaging Review Ct Head Wo Contrast  10/19/2015  ADDENDUM REPORT: 10/19/2015 16:10 ADDENDUM: Critical Value/emergent results were called by telephone at the time of interpretation on 10/19/2015 at 4:09 pm to Dr. Nicole Kindred , who verbally acknowledged these results. Electronically Signed   By: Marijo Conception, M.D.   On: 10/19/2015 16:10  10/19/2015  CLINICAL DATA:   Aphasia.  Status post fall. EXAM: CT HEAD WITHOUT CONTRAST TECHNIQUE: Contiguous axial images were obtained from the base of the skull through the vertex without intravenous contrast. COMPARISON:  CT scan of October 15, 2015. FINDINGS: Bony calvarium appears intact. Mild diffuse cortical atrophy is noted. Mild chronic ischemic white matter disease is noted. Old left cerebellar infarction is again noted. No mass effect or midline shift is noted. Ventricular size is within normal limits. There is no evidence of mass lesion, hemorrhage or acute infarction. IMPRESSION: Mild diffuse cortical atrophy. Mild chronic ischemic white matter disease. Old left cerebellar infarction. No acute intracranial abnormality seen. Electronically Signed: By:  Marijo Conception, M.D. On: 10/19/2015 16:04   Ct Cervical Spine Wo Contrast  10/19/2015  CLINICAL DATA:  Status post fall, neck pain EXAM: CT CERVICAL SPINE WITHOUT CONTRAST TECHNIQUE: Multidetector CT imaging of the cervical spine was performed without intravenous contrast. Multiplanar CT image reconstructions were also generated. COMPARISON:  None. FINDINGS: Mild straightening of the cervical spine, likely positional. No evidence of fracture or dislocation. Vertebral body heights are maintained. Dens appears intact. No prevertebral soft tissue swelling. Mild to moderate multilevel degenerative changes. Visualized thyroid is unremarkable. Visualized lung apices are notable for a 4 mm nodule on the right (series 201/ image 81). IMPRESSION: No evidence of traumatic injury to the cervical spine. Mild to moderate degenerative changes. 4 mm pulmonary nodule in the right lung apex. If this patient is high risk for primary bronchogenic neoplasm, a single follow-up CT chest is suggested in 1 year. If low risk, no dedicated follow-up imaging is required. Electronically Signed   By: Julian Hy M.D.   On: 10/19/2015 17:40   Dg Hip Unilat With Pelvis 2-3 Views Right  10/19/2015   CLINICAL DATA:  Severe pain status post unwitnessed fall EXAM: DG HIP (WITH OR WITHOUT PELVIS) 2-3V RIGHT COMPARISON:  08/05/2015 FINDINGS: No fracture or dislocation is seen. Mild degenerative changes. Visualized bony pelvis appears intact. IMPRESSION: No fracture or dislocation is seen. Electronically Signed   By: Julian Hy M.D.   On: 10/19/2015 17:27   I have personally reviewed and evaluated these images and lab results as part of my medical decision-making.   EKG Interpretation None      ED ECG REPORT   Date: 10/19/2015  Rate: 73  Rhythm: normal sinus rhythm  QRS Axis: normal  Intervals: normal  ST/T Wave abnormalities: nonspecific ST/T changes  Conduction Disutrbances:none  Narrative Interpretation:   Old EKG Reviewed: unchanged  I have personally reviewed the EKG tracing and agree with the computerized printout as noted.   MDM   Final diagnoses:  Aphasia  Fall, initial encounter   Pt comes in with aphasia. She has hx of recent stroke, HTN. Also has R sided knee pain and had arthroplasty done on that side at [facility].    tPA in stroke considered, but not given due to the following: Onset over 3-4.5 hours Recent stroke Contraindication (one or more apply): head trauma or stroke <3 months   Neuro team assessed and their recs appreciated. Medicine to admit. Pt has a distal femur fracture. Dr. Tamera Punt from Ortho to see her. Pt needs to be npo, but liekly to get surgery on Monday. Knee immobilizer ordered.  Varney Biles, MD 10/19/15 2146

## 2015-10-19 NOTE — Consult Note (Signed)
Referring Physician: Dr Kathrynn Humble    Chief Complaint: [place]  HPI: Nicole Ruiz is a 74 y.o. female with a history of previous strokes, chronic kidney disease, palpitations, hyperlipidemia, hypertension, and depression. She was discharged from [facility] 10/17/2015 after admission 10/15/2015 with right hemiparesis and speech difficulties. 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 Strokes felt to be embolic.  A TEE revealed a PFO. Lower extremity Dopplers were negative. A loop was implanted. The patient was treated with aspirin 325 mg daily. She was to have home health physical therapy.  Today the patient was at home when her right leg gave out and she fell. She was noted to be aphasic by EMS and she was brought in as a code stroke. Difficult history secondary to aphasia. Initial head CT showed no acute findings. The patient was having significant right lower extremity pain - suspect fracture. C-spine imaging and right lower extremity films ordered. She will be admitted for further workup. Her NIH score was a 2. TPA was not considered secondary to her recent stroke. Last seen normal at 1330  Date last known well: Date: 10/19/2015 Time last known well: Time: 13:30 tPA Given: No:  Recent stroke with possible lower extremity fracture.  Past Medical History  Diagnosis Date  . Hypertension   . Hyperlipidemia   . 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 Depression   . Stroke [facility])     Past Surgical History  Procedure Laterality Date  . Knee surgery    . Abdominal hysterectomy    . Arm surgery    . Hemorroidectomy    . Ep implantable device N/A 10/17/2015    Procedure: Loop Recorder Insertion;  Surgeon: Will Meredith Leeds, MD;  Location: [place] CV LAB;  Service: Cardiovascular;  Laterality: N/A;  . Tee without cardioversion N/A 10/17/2015    Procedure: TRANSESOPHAGEAL ECHOCARDIOGRAM (TEE);  Surgeon: Jerline Pain, MD;   Location: [facility] ENDOSCOPY;  Service: Cardiovascular;  Laterality: N/A;    Family History  Problem Relation Age of Onset  . Heart disease Mother 47  . Heart disease Brother 45   Social History:  reports that she has never smoked. She has never used smokeless tobacco. She reports that she does not drink alcohol or use illicit drugs.  Allergies: No Known Allergies  Medications:  No current facility-administered medications for this encounter.   Current Outpatient Prescriptions  Medication Sig Dispense Refill  . aspirin EC 325 MG EC tablet Take 1 tablet (325 mg total) by mouth daily. 90 tablet 0  . atorvastatin (LIPITOR) 40 MG tablet Take 1 tablet (40 mg total) by mouth daily. 90 tablet 0  . Cholecalciferol (VITAMIN D3) 2000 UNITS TABS Take 1 tablet by mouth daily.    . citalopram (CELEXA) 20 MG tablet Take 20 mg by mouth daily.    . cyanocobalamin 1000 MCG tablet Take 100 mcg by mouth daily.    . feeding supplement, ENSURE ENLIVE, (ENSURE ENLIVE) LIQD Take 237 mLs by mouth daily as needed (Please offer if meal completion is less than 50%). 30 Bottle 0  . methocarbamol (ROBAXIN) 750 MG tablet Take 750 mg by mouth 2 (two) times daily.    . nabumetone (RELAFEN) 500 MG tablet Take 2,000 mg by mouth daily.     . nortriptyline (PAMELOR) 25 MG capsule Take 25 mg by mouth at bedtime.    . Omega-3 Fatty Acids (OMEGA 3 PO) Take 1 capsule by  mouth daily.    [name] omeprazole (PRILOSEC OTC) 20 MG tablet Take 20 mg by mouth daily.    [name] OVER THE COUNTER MEDICATION Take 1 capsule by mouth daily. ( MEGA RED )    . propranolol (INDERAL) 10 MG tablet Take 10 mg by mouth 2 (two) times daily.    [name] pyridOXINE (VITAMIN B-6) 100 MG tablet Take 100 mg by mouth daily.    . traZODone (DESYREL) 50 MG tablet Take 50 mg by mouth at bedtime.       ROS: History obtained from the patient  Difficult secondary to aphasia  General ROS: negative for - chills, fatigue, fever, night sweats, weight gain or weight  loss Psychological ROS: negative for - behavioral disorder, hallucinations, memory difficulties, mood swings or suicidal ideation Ophthalmic ROS: negative for - blurry vision, double vision, eye pain or loss of vision ENT ROS: negative for - epistaxis, nasal discharge, oral lesions, sore throat, tinnitus or vertigo Allergy and Immunology ROS: negative for - hives or itchy/watery eyes Hematological and Lymphatic ROS: negative for - bleeding problems, bruising or swollen lymph nodes Endocrine ROS: negative for - galactorrhea, hair pattern changes, polydipsia/polyuria or temperature intolerance Respiratory ROS: negative for - cough, hemoptysis, shortness of breath or wheezing Cardiovascular ROS: negative for - chest pain, dyspnea on exertion, edema or irregular heartbeat Gastrointestinal ROS: negative for - abdominal pain, diarrhea, hematemesis, nausea/vomiting or stool incontinence Genito-Urinary ROS: negative for - dysuria, hematuria, incontinence or urinary frequency/urgency Musculoskeletal ROS: negative for - joint swelling or muscular weakness. Right lower extremity pain after her leg gave out and she fell. Neurological ROS: as noted in HPI Dermatological ROS: negative for rash and skin lesion changes   Physical Examination: Blood pressure 120/66, pulse 79, temperature 97.5 F (36.4 C), temperature source Oral, resp. rate 30, SpO2 100 %.  General - 74 year old female with significant right lower extremity pain. Heart - Regular rate and rhythm  Lungs - Clear to auscultation Chest - blood stained bandage left upper chest - site of recent loop implantation. Abdomen - Soft - non tender Extremities - Distal pulses weak to absent - no edema. Right lower extremity with abnormal positioning. Skin - Warm and dry  Mental Status: Alert, oriented to person only. Significant expressive aphasia.  Able to follow 3 step commands without cueing. Cranial Nerves: II: Visual fields grossly normal, pupils  equal, round, reactive to light and accommodation III,IV, VI: ptosis not present, extra-ocular motions intact bilaterally V,VII: smile symmetric, facial light touch sensation normal bilaterally VIII: hearing normal bilaterally IX,X: gag reflex present XI: bilateral shoulder shrug XII: midline tongue extension Motor: Strength 4-5 over 5 in all extremities except for the right lower extremity which could not be tested secondary to pain. Tone and bulk:normal tone throughout; no atrophy noted Sensory: Light touch intact throughout, bilaterally Deep Tendon Reflexes: 2+ and symmetric throughout Plantars: Right: mute   Left: downgoing Cerebellar: normal finger-to-nose Gait: Could not be tested      Laboratory Studies:  Basic Metabolic Panel:  Recent Labs Lab 10/15/15 1541 10/16/15 0515 10/19/15 1551  NA 140 139 139  K 3.8 3.9 4.3  CL 110 107 106  CO2 22 23  --   GLUCOSE 106* 76 111*  BUN 13 14 12   CREATININE 1.34* 1.15* 1.10*  CALCIUM 9.4 8.7*  --     Liver Function Tests:  Recent Labs Lab 10/15/15 1541 10/16/15 0515  AST 23 19  ALT 16 13*  ALKPHOS 51 44  BILITOT 0.7 0.6  PROT 6.2* 5.6*  ALBUMIN 3.8 3.4*   No results for input(s): LIPASE, AMYLASE in the last 168 hours. No results for input(s): AMMONIA in the last 168 hours.  CBC:  Recent Labs Lab 10/15/15 1541 10/16/15 0515 10/19/15 1551 10/19/15 1553  WBC 4.2 5.1  --  4.5  NEUTROABS 2.5  --   --  2.9  HGB 12.5 11.9* 13.3 12.9  HCT 37.4 34.8* 39.0 38.5  MCV 92.3 91.3  --  90.6  PLT 266 236  --  227    Cardiac Enzymes: No results for input(s): CKTOTAL, CKMB, CKMBINDEX, TROPONINI in the last 168 hours.  BNP: Invalid input(s): POCBNP  CBG: No results for input(s): GLUCAP in the last 168 hours.  Microbiology: No results found for this or any previous visit.  Coagulation Studies:  Recent Labs  10/19/15 1553  LABPROT 13.7  INR 1.03    Urinalysis: No results for input(s): COLORURINE,  LABSPEC, PHURINE, GLUCOSEU, HGBUR, BILIRUBINUR, KETONESUR, PROTEINUR, UROBILINOGEN, NITRITE, LEUKOCYTESUR in the last 168 hours.  Invalid input(s): APPERANCEUR  Lipid Panel:    Component Value Date/Time   CHOL 153 10/16/2015 0515   TRIG 67 10/16/2015 0515   HDL 55 10/16/2015 0515   CHOLHDL 2.8 10/16/2015 0515   VLDL 13 10/16/2015 0515   LDLCALC 85 10/16/2015 0515    HgbA1C:  Lab Results  Component Value Date   HGBA1C 5.8* 10/15/2015    Urine Drug Screen:  No results found for: LABOPIA, COCAINSCRNUR, LABBENZ, AMPHETMU, THCU, LABBARB  Alcohol Level: No results for input(s): ETH in the last 168 hours.  Other results: EKG: Pending  Imaging:   Ct Head Wo Contrast 10/19/2015   Mild diffuse cortical atrophy. Mild chronic ischemic white matter disease. Old left cerebellar infarction. No acute intracranial abnormality seen.    Assessment: 74 y.o. female with previous strokes, chronic kidney disease, palpitations, hyperlipidemia, hypertension, and depression recently discharged from [facility] on 10/17/2015 after being admitted for a left MCA infarct felt to be embolic. TEE = PFO. LE Duplex - neg. Loop implanted. Readmitted today as a code stroke after her right leg gave out causing her to fall with subsequent significant pain in the right lower extremity - possible fracture. Head CT negative for acute findings. She has acute worsening of expressive aphasia indicating probable extension of left MCA territory infarction.  Stroke Risk Factors - hypertension and advanced age as well as previous strokes.  Plan: 1. HgbA1c, fasting lipid panel 2. MRI of the brain without contrast 3. PT consult, OT consult, Speech consult 4. Prophylactic therapy-Antiplatelet med: Aspirin - dose 325 mg daily. 5. NPO until RN stroke swallow screen 6. Telemetry monitoring 7. Frequent neuro checks 8. Interrogate loop   Lowry Ram [facility] Pager [phone] 10/19/2015, 5:02 PM  I personally participated in this patient's evaluation and management, including clinical examination, as well as formulating the above clinical impression and management recommendations.  Rush Farmer M.D. [facility] [phone]

## 2015-10-19 NOTE — ED Notes (Signed)
Admitting MD at bedside.

## 2015-10-19 NOTE — Progress Notes (Deleted)
Orthopedic Tech Progress Note Patient Details:  Nicole Ruiz 1941/05/12 [id number] Applied Velcro knee immobilizer to RLE.  Pulses, motion, sensation intact before and after application.  Capillary refill less than 2 seconds before and after applicartion. Ortho Devices Type of Ortho Device: Knee Immobilizer Ortho Device/Splint Location: RLE Ortho Device/Splint Interventions: Application   Darrol Poke 10/19/2015, 10:17 PM

## 2015-10-19 NOTE — Progress Notes (Signed)
Orthopedic Tech Progress Note Patient Details:  [name] August 09, 1941 [id number] Applied Velcro knee immobilizer to RLE.  Pulses, sensation, motion intact before and after application.  Capillary refill less than 2 seconds before and after application. Patient ID: Nicole Ruiz, female   DOB: June 14, 1941, 74 y.o.   MRN: [id number]   Darrol Poke 10/19/2015, 10:19 PM

## 2015-10-20 ENCOUNTER — Inpatient Hospital Stay (HOSPITAL_COMMUNITY): Payer: Medicare HMO | Admitting: Anesthesiology

## 2015-10-20 ENCOUNTER — Inpatient Hospital Stay (HOSPITAL_COMMUNITY): Payer: Medicare HMO

## 2015-10-20 ENCOUNTER — Encounter (HOSPITAL_COMMUNITY): Payer: Self-pay | Admitting: *Deleted

## 2015-10-20 ENCOUNTER — Encounter (HOSPITAL_COMMUNITY)
Admission: EM | Disposition: A | Discharge status: Skilled Nursing Facility | Payer: Self-pay | Source: Home / Self Care | Attending: Internal Medicine

## 2015-10-20 DIAGNOSIS — S7291XA Unspecified fracture of right femur, initial encounter for closed fracture: Secondary | ICD-10-CM | POA: Diagnosis present

## 2015-10-20 DIAGNOSIS — I63512 Cerebral infarction due to unspecified occlusion or stenosis of left middle cerebral artery: Secondary | ICD-10-CM

## 2015-10-20 DIAGNOSIS — W19XXXA Unspecified fall, initial encounter: Secondary | ICD-10-CM

## 2015-10-20 DIAGNOSIS — E785 Hyperlipidemia, unspecified: Secondary | ICD-10-CM

## 2015-10-20 DIAGNOSIS — S72409A Unspecified fracture of lower end of unspecified femur, initial encounter for closed fracture: Secondary | ICD-10-CM | POA: Insufficient documentation

## 2015-10-20 DIAGNOSIS — S72401D Unspecified fracture of lower end of right femur, subsequent encounter for closed fracture with routine healing: Secondary | ICD-10-CM

## 2015-10-20 HISTORY — PX: FEMUR IM NAIL: SHX1597

## 2015-10-20 LAB — LIPID PANEL
CHOLESTEROL: 165 mg/dL (ref 0–200)
HDL: 54 mg/dL (ref >40)
LDL Cholesterol: 100 mg/dL — ABNORMAL HIGH (ref 0–99)
TRIGLYCERIDES: 55 mg/dL (ref <150)
Total CHOL/HDL Ratio: 3.1 RATIO
VLDL: 11 mg/dL (ref 0–40)

## 2015-10-20 LAB — RAPID URINE DRUG SCREEN, HOSP PERFORMED
Amphetamines: NOT DETECTED
Barbiturates: NOT DETECTED
Benzodiazepines: POSITIVE — AB
Cocaine: NOT DETECTED
OPIATES: POSITIVE — AB
TETRAHYDROCANNABINOL: NOT DETECTED

## 2015-10-20 LAB — URINALYSIS, ROUTINE W REFLEX MICROSCOPIC
Glucose, UA: NEGATIVE mg/dL
HGB URINE DIPSTICK: NEGATIVE
Ketones, ur: >80 mg/dL — AB
Leukocytes, UA: NEGATIVE
NITRITE: NEGATIVE
PROTEIN: NEGATIVE mg/dL
SPECIFIC GRAVITY, URINE: 1.021 (ref 1.005–1.030)
UROBILINOGEN UA: 1 mg/dL (ref 0.0–1.0)
pH: 5.5 (ref 5.0–8.0)

## 2015-10-20 LAB — SURGICAL PCR SCREEN
MRSA, PCR: NEGATIVE
Staphylococcus aureus: NEGATIVE

## 2015-10-20 SURGERY — INSERTION, INTRAMEDULLARY ROD, FEMUR, RETROGRADE
Anesthesia: General | Site: Leg Upper | Laterality: Right

## 2015-10-20 MED ORDER — ONDANSETRON HCL 4 MG/2ML IJ SOLN
INTRAMUSCULAR | Status: AC
  Filled 2015-10-20: qty 2

## 2015-10-20 MED ORDER — ROCURONIUM BROMIDE 100 MG/10ML IV SOLN
INTRAVENOUS | Status: DC | PRN
  Administered 2015-10-20: 10 mg via INTRAVENOUS

## 2015-10-20 MED ORDER — METOCLOPRAMIDE HCL 5 MG/ML IJ SOLN: 5.0000 mg | Freq: Three times a day (TID) | INTRAMUSCULAR | Status: DC | PRN

## 2015-10-20 MED ORDER — OXYCODONE HCL 5 MG PO TABS: 5.0000 mg | ORAL_TABLET | ORAL | Status: DC | PRN

## 2015-10-20 MED ORDER — FENTANYL CITRATE (PF) 100 MCG/2ML IJ SOLN
INTRAMUSCULAR | Status: AC
  Filled 2015-10-20: qty 2

## 2015-10-20 MED ORDER — LIDOCAINE HCL (CARDIAC) 20 MG/ML IV SOLN
INTRAVENOUS | Status: DC | PRN
  Administered 2015-10-20: 60 mg via INTRAVENOUS

## 2015-10-20 MED ORDER — ROCURONIUM BROMIDE 50 MG/5ML IV SOLN
INTRAVENOUS | Status: AC
  Filled 2015-10-20: qty 1

## 2015-10-20 MED ORDER — HYDROCODONE-ACETAMINOPHEN 7.5-325 MG PO TABS
1.0000 | ORAL_TABLET | Freq: Four times a day (QID) | ORAL | Status: DC
  Administered 2015-10-20 – 2015-10-21 (×3): 1 via ORAL
  Filled 2015-10-20 (×3): qty 1

## 2015-10-20 MED ORDER — HYDROCODONE-ACETAMINOPHEN 7.5-325 MG PO TABS
1.0000 | ORAL_TABLET | Freq: Once | ORAL | Status: DC | PRN
Start: 2015-10-20 — End: 2015-10-20

## 2015-10-20 MED ORDER — ACETAMINOPHEN 650 MG RE SUPP: 650.0000 mg | Freq: Four times a day (QID) | RECTAL | Status: DC | PRN

## 2015-10-20 MED ORDER — PHENYLEPHRINE HCL 10 MG/ML IJ SOLN
10.0000 mg | INTRAMUSCULAR | Status: DC | PRN
  Administered 2015-10-20: 60 ug/min via INTRAVENOUS

## 2015-10-20 MED ORDER — FENTANYL CITRATE (PF) 250 MCG/5ML IJ SOLN
INTRAMUSCULAR | Status: AC
  Filled 2015-10-20: qty 5

## 2015-10-20 MED ORDER — FENTANYL CITRATE (PF) 100 MCG/2ML IJ SOLN
25.0000 ug | INTRAMUSCULAR | Status: DC | PRN
  Administered 2015-10-20: 25 ug via INTRAVENOUS

## 2015-10-20 MED ORDER — PROPOFOL 10 MG/ML IV BOLUS
INTRAVENOUS | Status: DC | PRN
  Administered 2015-10-20: 80 mg via INTRAVENOUS

## 2015-10-20 MED ORDER — SUCCINYLCHOLINE CHLORIDE 20 MG/ML IJ SOLN
INTRAMUSCULAR | Status: DC | PRN
  Administered 2015-10-20: 80 mg via INTRAVENOUS

## 2015-10-20 MED ORDER — ESMOLOL HCL 100 MG/10ML IV SOLN
INTRAVENOUS | Status: AC
  Filled 2015-10-20: qty 10

## 2015-10-20 MED ORDER — ONDANSETRON HCL 4 MG/2ML IJ SOLN
INTRAMUSCULAR | Status: DC | PRN
  Administered 2015-10-20: 4 mg via INTRAVENOUS

## 2015-10-20 MED ORDER — ACETAMINOPHEN 325 MG PO TABS: 650.0000 mg | ORAL_TABLET | Freq: Four times a day (QID) | ORAL | Status: DC | PRN

## 2015-10-20 MED ORDER — ENOXAPARIN SODIUM 40 MG/0.4ML ~~LOC~~ SOLN
40.0000 mg | SUBCUTANEOUS | Status: DC
  Administered 2015-10-21 – 2015-10-22 (×2): 40 mg via SUBCUTANEOUS
  Filled 2015-10-20 (×2): qty 0.4

## 2015-10-20 MED ORDER — PROPOFOL 10 MG/ML IV BOLUS
INTRAVENOUS | Status: AC
  Filled 2015-10-20: qty 20

## 2015-10-20 MED ORDER — 0.9 % SODIUM CHLORIDE (POUR BTL) OPTIME
TOPICAL | Status: DC | PRN
  Administered 2015-10-20: 1000 mL

## 2015-10-20 MED ORDER — ONDANSETRON HCL 4 MG/2ML IJ SOLN
4.0000 mg | Freq: Four times a day (QID) | INTRAMUSCULAR | Status: DC | PRN
  Administered 2015-10-20: 4 mg via INTRAVENOUS
  Filled 2015-10-20: qty 2

## 2015-10-20 MED ORDER — ONDANSETRON HCL 4 MG PO TABS: 4.0000 mg | ORAL_TABLET | Freq: Four times a day (QID) | ORAL | Status: DC | PRN

## 2015-10-20 MED ORDER — PROMETHAZINE HCL 25 MG/ML IJ SOLN: 6.2500 mg | INTRAMUSCULAR | Status: DC | PRN

## 2015-10-20 MED ORDER — LACTATED RINGERS IV SOLN
INTRAVENOUS | Status: DC | PRN
  Administered 2015-10-20 (×2): via INTRAVENOUS

## 2015-10-20 MED ORDER — FENTANYL CITRATE (PF) 250 MCG/5ML IJ SOLN
INTRAMUSCULAR | Status: DC | PRN
  Administered 2015-10-20: 25 ug via INTRAVENOUS
  Administered 2015-10-20 (×3): 50 ug via INTRAVENOUS
  Administered 2015-10-20: 25 ug via INTRAVENOUS
  Administered 2015-10-20: 50 ug via INTRAVENOUS

## 2015-10-20 MED ORDER — CEFAZOLIN SODIUM-DEXTROSE 2-3 GM-% IV SOLR
2.0000 g | INTRAVENOUS | Status: AC
  Administered 2015-10-20: 2 g via INTRAVENOUS
  Filled 2015-10-20 (×2): qty 50

## 2015-10-20 MED ORDER — PHENYLEPHRINE HCL 10 MG/ML IJ SOLN
INTRAMUSCULAR | Status: DC | PRN
  Administered 2015-10-20 (×2): 100 ug via INTRAVENOUS

## 2015-10-20 MED ORDER — ESMOLOL HCL 100 MG/10ML IV SOLN
INTRAVENOUS | Status: DC | PRN
  Administered 2015-10-20: 20 mg via INTRAVENOUS

## 2015-10-20 MED ORDER — LIDOCAINE HCL (CARDIAC) 20 MG/ML IV SOLN
INTRAVENOUS | Status: AC
  Filled 2015-10-20: qty 5

## 2015-10-20 MED ORDER — CEFAZOLIN SODIUM 1-5 GM-% IV SOLN
1.0000 g | Freq: Four times a day (QID) | INTRAVENOUS | Status: AC
  Administered 2015-10-20 – 2015-10-21 (×3): 1 g via INTRAVENOUS
  Filled 2015-10-20 (×4): qty 50

## 2015-10-20 MED ORDER — METOCLOPRAMIDE HCL 5 MG PO TABS: 5.0000 mg | ORAL_TABLET | Freq: Three times a day (TID) | ORAL | Status: DC | PRN

## 2015-10-20 MED ORDER — CHLORHEXIDINE GLUCONATE 4 % EX LIQD
60.0000 mL | Freq: Once | CUTANEOUS | Status: AC
  Administered 2015-10-20: 4 via TOPICAL
  Filled 2015-10-20: qty 60

## 2015-10-20 MED ORDER — LORAZEPAM 2 MG/ML IJ SOLN
1.0000 mg | Freq: Once | INTRAMUSCULAR | Status: AC
  Administered 2015-10-20: 1 mg via INTRAVENOUS
  Filled 2015-10-20: qty 1

## 2015-10-20 SURGICAL SUPPLY — 53 items
BANDAGE ELASTIC 4 VELCRO ST LF (GAUZE/BANDAGES/DRESSINGS) ×1 IMPLANT
BANDAGE ELASTIC 6 VELCRO ST LF (GAUZE/BANDAGES/DRESSINGS) ×1 IMPLANT
BIT DRILL CALIBRATED 4.3MMX365 (DRILL) IMPLANT
BIT DRILL CROWE PNT TWST 4.5MM (DRILL) IMPLANT
BLADE SURG 15 STRL LF DISP TIS (BLADE) IMPLANT
BLADE SURG 15 STRL SS (BLADE) ×2
BNDG GAUZE ELAST 4 BULKY (GAUZE/BANDAGES/DRESSINGS) ×1 IMPLANT
DRAPE ORTHO SPLIT 77X108 STRL (DRAPES) ×4
DRAPE STERI IOBAN 125X83 (DRAPES) ×1 IMPLANT
DRAPE SURG ORHT 6 SPLT 77X108 (DRAPES) IMPLANT
DRILL CALIBRATED 4.3MMX365 (DRILL) ×2
DRILL CROWE POINT TWIST 4.5MM (DRILL) ×2
DRSG MEPILEX BORDER 4X4 (GAUZE/BANDAGES/DRESSINGS) ×1 IMPLANT
DRSG PAD ABDOMINAL 8X10 ST (GAUZE/BANDAGES/DRESSINGS) ×1 IMPLANT
DURAPREP 26ML APPLICATOR (WOUND CARE) ×1 IMPLANT
ELECT PENCIL ROCKER SW 15FT (MISCELLANEOUS) IMPLANT
ELECT REM PT RETURN 9FT ADLT (ELECTROSURGICAL) ×2
ELECTRODE REM PT RTRN 9FT ADLT (ELECTROSURGICAL) IMPLANT
GAUZE SPONGE 4X4 12PLY STRL (GAUZE/BANDAGES/DRESSINGS) ×1 IMPLANT
GAUZE XEROFORM 5X9 LF (GAUZE/BANDAGES/DRESSINGS) ×1 IMPLANT
GLOVE BIO SURGEON STRL SZ7 (GLOVE) ×1 IMPLANT
GLOVE BIO SURGEON STRL SZ7.5 (GLOVE) ×1 IMPLANT
GLOVE BIO SURGEON STRL SZ8 (GLOVE) ×1 IMPLANT
GLOVE BIOGEL M 6.5 STRL (GLOVE) ×1 IMPLANT
GLOVE BIOGEL PI IND STRL 7.5 (GLOVE) IMPLANT
GLOVE BIOGEL PI IND STRL 8 (GLOVE) IMPLANT
GLOVE BIOGEL PI INDICATOR 7.5 (GLOVE) ×2
GLOVE BIOGEL PI INDICATOR 8 (GLOVE) ×1
GLOVE ECLIPSE 8.0 STRL XLNG CF (GLOVE) IMPLANT
GLOVE ORTHO TXT STRL SZ7.5 (GLOVE) ×1 IMPLANT
GLOVE SURG ORTHO 8.0 STRL STRW (GLOVE) ×1 IMPLANT
GOWN SPEC L3 XXLG W/TWL (GOWN DISPOSABLE) IMPLANT
GOWN STRL REUS W/TWL LRG LVL3 (GOWN DISPOSABLE) ×3 IMPLANT
GUIDEPIN 3.2X17.5 THRD DISP (PIN) ×1 IMPLANT
GUIDEWIRE BEAD TIP (WIRE) ×1 IMPLANT
KIT BASIN OR (CUSTOM PROCEDURE TRAY) ×1 IMPLANT
MANIFOLD NEPTUNE II (INSTRUMENTS) ×1 IMPLANT
NAIL FEM RETRO 10.5X340 (Nail) ×1 IMPLANT
NS IRRIG 1000ML POUR BTL (IV SOLUTION) ×1 IMPLANT
PACK GENERAL/GYN (CUSTOM PROCEDURE TRAY) ×1 IMPLANT
SCREW CORT TI DBL LEAD 5X26 (Screw) ×1 IMPLANT
SCREW CORT TI DBL LEAD 5X56 (Screw) ×1 IMPLANT
SCREW CORT TI DBL LEAD 5X60 (Screw) ×1 IMPLANT
SCREW CORT TI DBL LEAD 5X75 (Screw) ×1 IMPLANT
SCREW CORT TI DBL LEAD 5X80 (Screw) ×1 IMPLANT
STAPLER VISISTAT 35W (STAPLE) ×1 IMPLANT
SUT VIC AB 0 CT1 27 (SUTURE) ×8
SUT VIC AB 0 CT1 27XBRD ANBCTR (SUTURE) IMPLANT
SUT VIC AB 0 CT1 27XBRD ANTBC (SUTURE) IMPLANT
SUT VIC AB 2-0 CT1 27 (SUTURE) ×4
SUT VIC AB 2-0 CT1 TAPERPNT 27 (SUTURE) IMPLANT
TOWEL OR 17X26 10 PK STRL BLUE (TOWEL DISPOSABLE) ×1 IMPLANT
TRAY FOLEY W/METER SILVER 16FR (SET/KITS/TRAYS/PACK) IMPLANT

## 2015-10-20 NOTE — Anesthesia Procedure Notes (Signed)
Procedure Name: Intubation Date/Time: 10/20/2015 2:32 PM Performed by: Marinda Elk A Pre-anesthesia Checklist: Patient identified, Timeout performed, Emergency Drugs available, Suction available and Patient being monitored Patient Re-evaluated:Patient Re-evaluated prior to inductionOxygen Delivery Method: Circle system utilized Preoxygenation: Pre-oxygenation with 100% oxygen Intubation Type: IV induction Ventilation: Mask ventilation without difficulty Grade View: Grade I Tube type: Oral Tube size: 7.5 mm Number of attempts: 1 Airway Equipment and Method: Stylet Placement Confirmation: ETT inserted through vocal cords under direct vision,  breath sounds checked- equal and bilateral and positive ETCO2 Secured at: 21 cm Tube secured with: Tape Dental Injury: Teeth and Oropharynx as per pre-operative assessment

## 2015-10-20 NOTE — Transfer of Care (Signed)
Immediate Anesthesia Transfer of Care Note  Patient: Nicole Ruiz  Procedure(s) Performed: Procedure(s): INTRAMEDULLARY (IM) RETROGRADE FEMORAL NAILING (Right)  Patient Location: PACU  Anesthesia Type:General  Level of Consciousness: awake  Airway & Oxygen Therapy: Patient Spontanous Breathing  Post-op Assessment: Report given to RN and Post -op Vital signs reviewed and stable  Post vital signs: Reviewed and stable  Last Vitals:  Filed Vitals:   10/20/15 1326  BP: 138/57  Pulse: 85  Temp: 36.9 C  Resp: 932    Complications: No apparent anesthesia complications

## 2015-10-20 NOTE — Progress Notes (Signed)
PROGRESS NOTE  Nicole Ruiz WUJ:[id number] DOB: Apr 01, 1941 DOA: 10/19/2015 PCP: Nicole Inches, MD   HPI: Nicole Ruiz is a 74 y.o. female with a Past Medical History of left MCA territory stroke-presumed to be embolic-was just discharged from this facility on 10/20 after a full workup including TEE with PFO but negative lower extremity Doppler-underwent loop recorder implantation-was doing well since discharge-andsubsequently fell while in her bathroom 10/22 and was readmitted with femur fracture.  Subjective / 24 H Interval events - doing well this morning, denies chest pain / palpitations, denies dyspnea. Denies weakness or numbness/tingling.  Assessment/Plan: Principal Problem:   Fall Active Problems:   Palpitations   Hypertension   Left middle cerebral artery stroke (HCC)   Dyslipidemia   Nodule of right lung   Femur fracture, right (HCC)   Fall resulting in acute displaced fracture of distal right femur - noted in the chart that patient declined HHPT on discharge - loop recorder interrogated on admission, no significant events noted per admitting MD - orthopedic surgery consulted, recommending surgical repair, from IM standpoint she is stable for surgery, pending neurology evaluation as well. Discussed with Dr. Tamera Punt and Mikey Bussing PA from neuro. - avoid hypotension  CVA - MRI on admission with interval evolution of recently identified acute left MCA territory infarcts, with several new scattered cortical and subcortical ischemic infarcts superimposed in the same region of the posterior left frontal lobe - stroke team following, she had complete workup done few days ago  Hypertension  - Hold antihypertensives - allow permissive hypertension.   Palpitations  - Monitor in telemetry - no events so far   Dyslipidemia  - Continue statin   CKD stage III  - Creatinine close to usual baseline  Nodule of right lung  - Seen on CT scan of the cervical  spine - will need follow-up as outpatient  Dysphagia - secondary to CVA - now NPO, dysphagia 3 post op per SLP evaluation   Diet: Diet NPO time specified Fluids: none  DVT Prophylaxis: Lovenox  Code Status: Full Code Family Communication: no family bedside  Disposition Plan: likely SNF when ready   Barriers to discharge: femur fracture  Consultants:  Orthopedic surgery   Neurology   Procedures:  None    Antibiotics  Anti-infectives    None       Studies  Ct Head Wo Contrast  10/19/2015  ADDENDUM REPORT: 10/19/2015 16:10 ADDENDUM: Critical Value/emergent results were called by telephone at the time of interpretation on 10/19/2015 at 4:09 pm to Dr. Nicole Kindred , who verbally acknowledged these results. Electronically Signed   By: Marijo Conception, M.D.   On: 10/19/2015 16:10  10/19/2015  CLINICAL DATA:  Aphasia.  Status post fall. EXAM: CT HEAD WITHOUT CONTRAST TECHNIQUE: Contiguous axial images were obtained from the base of the skull through the vertex without intravenous contrast. COMPARISON:  CT scan of October 15, 2015. FINDINGS: Bony calvarium appears intact. Mild diffuse cortical atrophy is noted. Mild chronic ischemic white matter disease is noted. Old left cerebellar infarction is again noted. No mass effect or midline shift is noted. Ventricular size is within normal limits. There is no evidence of mass lesion, hemorrhage or acute infarction. IMPRESSION: Mild diffuse cortical atrophy. Mild chronic ischemic white matter disease. Old left cerebellar infarction. No acute intracranial abnormality seen. Electronically Signed: By: Marijo Conception, M.D. On: 10/19/2015 16:04   Ct Cervical Spine Wo Contrast  10/19/2015  CLINICAL DATA:  Status post fall, neck  pain EXAM: CT CERVICAL SPINE WITHOUT CONTRAST TECHNIQUE: Multidetector CT imaging of the cervical spine was performed without intravenous contrast. Multiplanar CT image reconstructions were also generated. COMPARISON:   None. FINDINGS: Mild straightening of the cervical spine, likely positional. No evidence of fracture or dislocation. Vertebral body heights are maintained. Dens appears intact. No prevertebral soft tissue swelling. Mild to moderate multilevel degenerative changes. Visualized thyroid is unremarkable. Visualized lung apices are notable for a 4 mm nodule on the right (series 201/ image 81). IMPRESSION: No evidence of traumatic injury to the cervical spine. Mild to moderate degenerative changes. 4 mm pulmonary nodule in the right lung apex. If this patient is high risk for primary bronchogenic neoplasm, a single follow-up CT chest is suggested in 1 year. If low risk, no dedicated follow-up imaging is required. Electronically Signed   By: Julian Hy M.D.   On: 10/19/2015 17:40   Ct Pelvis Wo Contrast  10/19/2015  CLINICAL DATA:  74 year old female acute bilateral hip pain following fall. Initial encounter. EXAM: CT PELVIS WITHOUT CONTRAST TECHNIQUE: Multidetector CT imaging of the pelvis was performed following the standard protocol without intravenous contrast. COMPARISON:  10/19/2015 radiographs FINDINGS: There is no evidence of acute fracture, subluxation or dislocation. Mild degenerative changes in both hips are noted. Mild to moderate degenerative changes at L5-S1 present. No soft tissue abnormalities are identified. IMPRESSION: No evidence of acute bony abnormality. Electronically Signed   By: Margarette Canada M.D.   On: 10/19/2015 19:31   Mr Brain Wo Contrast  10/20/2015  CLINICAL DATA:  Initial evaluation for acute fall, possible worsening of expressive aphasia. Recent stroke. EXAM: MRI HEAD WITHOUT CONTRAST TECHNIQUE: Multiplanar, multiecho pulse sequences of the brain and surrounding structures were obtained without intravenous contrast. COMPARISON:  Prior MRI from 10/15/2015. FINDINGS: Patchy diffusion abnormality related to recent left MCA territory infarcts again seen. There has been interval  evolution of these infarcts, with some normalization of the ADC signal as compared to prior. However, there appear to be several new superimposed cortical and subcortical infarcts in the same region which have developed since previous study. These are now all best appreciated as the most prevalent hypo intense signal seen on ADC map (Series 400, image 31). No associated hemorrhage or mass effect. Major intravascular flow voids are maintained. Atrophy with mild chronic small vessel ischemic disease again noted. Remote lacunar infarcts within the right centrum semi ovale/corona radiata again noted. Remote cortical infarct within the parasagittal left occipital lobe. Bilateral remote cerebellar infarcts, left greater than right. No mass lesion, midline shift, or mass effect. No hydrocephalus. No extra-axial fluid collection. Craniocervical junction within normal limits. Pituitary gland normal. No acute abnormality about the orbits. Mucosal thickening throughout the paranasal sinuses. Small air-fluid levels within the bilateral maxillary sinuses. Trace opacity within the inferior left mastoid air cells. Inner ear structures grossly normal. Bone marrow signal intensity within normal limits. Scalp soft tissues demonstrate no acute abnormality. IMPRESSION: 1. Interval evolution of recently identified acute left MCA territory infarcts, with several new scattered cortical and subcortical ischemic infarcts superimposed in the same region of the posterior left frontal lobe. No associated mass effect or hemorrhage. 2. Chronic infarcts as detailed above, stable. Electronically Signed   By: Jeannine Boga M.D.   On: 10/20/2015 02:59   Dg Knee Complete 4 Views Right  10/19/2015  CLINICAL DATA:  Right knee pain status post fall. Stroke 0 couple weeks ago. EXAM: RIGHT KNEE - COMPLETE 4+ VIEW COMPARISON:  None. FINDINGS: Patient is status  post total knee arthroplasty. The hardware appears intact and well positioned. There  is an acute displaced fracture of the distal right femur, obliquely oriented with approximately 1 cm fracture displacement. Lateral view shows that there is also impaction at the fracture site. There is an associated joint effusion within the adjacent suprapatellar bursa. IMPRESSION: 1. Acute displaced fracture of the distal right femur, just above the right knee arthroplasty hardware, with approximately 1 cm fracture displacement. There is associated angulation deformity at the fracture site and impaction at the fracture site. 2. Status post total knee arthroplasty. Hardware appears intact and well positioned. Electronically Signed   By: Franki Cabot M.D.   On: 10/19/2015 19:47   Dg Hip Unilat With Pelvis 2-3 Views Right  10/19/2015  CLINICAL DATA:  Severe pain status post unwitnessed fall EXAM: DG HIP (WITH OR WITHOUT PELVIS) 2-3V RIGHT COMPARISON:  08/05/2015 FINDINGS: No fracture or dislocation is seen. Mild degenerative changes. Visualized bony pelvis appears intact. IMPRESSION: No fracture or dislocation is seen. Electronically Signed   By: Julian Hy M.D.   On: 10/19/2015 17:27    Objective  Filed Vitals:   10/19/15 2345 10/20/15 0231 10/20/15 0431 10/20/15 0632  BP:  89/60 122/54 121/64  Pulse:  92 79 81  Temp: 98.6 F (37 C)     TempSrc:      Resp:  16 19 13   Height:      Weight:      SpO2:  95% 94% 94%    Intake/Output Summary (Last 24 hours) at 10/20/15 1026 Last data filed at 10/20/15 0830  Gross per 24 hour  Intake    120 ml  Output      0 ml  Net    120 ml   Filed Weights   10/19/15 2144  Weight: 71.532 kg (157 lb 11.2 oz)   Exam:  GENERAL: NAD  HEENT: head NCAT, no scleral icterus. Pupils round and reactive. Mucous membranes are moist. Posterior pharynx clear of any exudate or lesions.  LUNGS: Clear to auscultation. No wheezing or crackles  HEART: Regular rate and rhythm without murmur. 2+ pulses, no JVD, no peripheral edema  ABDOMEN: Soft,  non-distended, non-tender. Positive bowel sounds.  EXTREMITIES: Without any cyanosis or clubbing. Good muscle tone  NEUROLOGIC: Alert and oriented x3. Word finding difficulty noted, mild. Strength equal in all extremities except RLE due to pain. CN grossly intact.  Data Reviewed: Basic Metabolic Panel:  Recent Labs Lab 10/15/15 1541 10/16/15 0515 10/19/15 1551 10/19/15 1553  NA 140 139 139 136  K 3.8 3.9 4.3 4.2  CL 110 107 106 102  CO2 22 23  --  20*  GLUCOSE 106* 76 111* 115*  BUN 13 14 12 12   CREATININE 1.34* 1.15* 1.10* 1.25*  CALCIUM 9.4 8.7*  --  9.2   Liver Function Tests:  Recent Labs Lab 10/15/15 1541 10/16/15 0515 10/19/15 1553  AST 23 19 58*  ALT 16 13* 36  ALKPHOS 51 44 53  BILITOT 0.7 0.6 0.9  PROT 6.2* 5.6* 6.1*  ALBUMIN 3.8 3.4* 3.6   No results for input(s): LIPASE, AMYLASE in the last 168 hours. No results for input(s): AMMONIA in the last 168 hours. CBC:  Recent Labs Lab 10/15/15 1541 10/16/15 0515 10/19/15 1551 10/19/15 1553  WBC 4.2 5.1  --  4.5  NEUTROABS 2.5  --   --  2.9  HGB 12.5 11.9* 13.3 12.9  HCT 37.4 34.8* 39.0 38.5  MCV 92.3 91.3  --  90.6  PLT 266 236  --  227   Cardiac Enzymes: No results for input(s): CKTOTAL, CKMB, CKMBINDEX, TROPONINI in the last 168 hours. BNP (last 3 results) No results for input(s): BNP in the last 8760 hours.  ProBNP (last 3 results) No results for input(s): PROBNP in the last 8760 hours.  CBG: No results for input(s): GLUCAP in the last 168 hours.  No results found for this or any previous visit (from the past 240 hour(s)).   Scheduled Meds: . aspirin  300 mg Rectal Daily   Or  . aspirin  325 mg Oral Daily  . atorvastatin  40 mg Oral q1800  . enoxaparin (LOVENOX) injection  40 mg Subcutaneous QHS  . nortriptyline  25 mg Oral QHS   Continuous Infusions: . sodium chloride 100 mL/hr at 10/19/15 2344    Marzetta Board, MD [facility] Pager [phone]. If 7 PM - 7 AM, please  contact night-coverage at [email or web address], password [email or web address] 10/20/2015, 10:26 AM  LOS: 1 day

## 2015-10-20 NOTE — Progress Notes (Signed)
Follow-up note:  CT pelvis w/o cm negative for any acute findings. However (R) knee imaging positive for acute displaced fx of the distal (R) femur. Pt remains in (R) knee immobilizer. Ortho service consulted. Spoke w/ Alfredo Martinez w/ [facility]. She will review films and discuss w/ Dr Tamera Punt. She is to return call if there are further urgent recommendations. Otherwise ortho service to see pt in am.  Jeryl Columbia, NP-C [facility] Pager [phone]

## 2015-10-20 NOTE — Op Note (Signed)
Procedure(s): INTRAMEDULLARY (IM) RETROGRADE FEMORAL NAILING Procedure Note  Nicole Ruiz female 74 y.o. 10/20/2015  Procedure(s) and Anesthesia Type:    * INTRAMEDULLARY (IM) RETROGRADE FEMORAL NAILING  for periprosthetic distal femur fracture- General  Surgeon(s) and Role:    * Tania Ade, MD - Primary   Indications:  74 y.o. female s/p fall with oblique distal femur fracture just proximal to a well fixed total knee replacement done within the last 2 years. Indicated for surgical treatment to restore anatomic alignment, allow earlier range of motion and mobilization. The patient had a recent stroke,. She was extensively worked up by internal medicine neurology and cardiology. His felt that benefits of surgery outweigh the risks at this time.     Surgeon: Nita Sells   Assistants: Jeanmarie Hubert PA-C [facility] was present and scrubbed throughout the procedure and was essential in positioning, retraction, exposure, and closure)  Anesthesia: General endotracheal anesthesia    Procedure Detail  INTRAMEDULLARY (IM) RETROGRADE FEMORAL NAILING  Findings: [employer] [place] nail retrograde size 10.5 x 340 with 4 distal interlocking screws locked in place to make a fixed construct. One proximal interlocking screw anterior to posterior.  Estimated Blood Loss:  less than 100 mL         Drains: none  Blood Given: none          Specimens: none        Complications:  * No complications entered in OR log *         Disposition: PACU - hemodynamically stable.         Condition: stable    Procedure:  The patient was identified in the preoperative holding area  where I personally marked the operative site after verifying site, side,  and procedure with the patient. She was taken back to the operating  room where general anesthesia was induced without complication.  She was kept in the supine position. The right lower extremity was prepped and draped in the  standard sterile fashion up to the proximal thigh. The knee was bent to a 45 angle over a radiolucent triangle and a approximately 4 cm incision was made over the patellar tendon through her previous TKA incision. Dissection was carried down to the medial aspect of the patellar tendon and an arthrotomy was created. The joint was noted to have hematoma which was evacuated. The TKA components appeared intact. The guidepin was placed through the notch of the implant and reduction was achieved in AP and lateral planes. The 12.5 mm proximal reamer was advanced while protecting the TKA components. The ball-tipped guidewire was then placed in the appropriate measurement was made. The femoral canal was reamed up to 12 mm and the 10.5 x 340 mm nail was advanced over the guidepin while holding a reduction. The reduction was viewed in AP and lateral planes with fluoroscopy and felt to be acceptable. A large reduction clamp was used outside the skin over folded towels to assist in holding the reduction while the nail was advanced. Once the positioning was verified the distal 4 screws were placed using the jig with 3 lateral to medial and one medial to lateral. Once all screws were placed the distal locking screw was advanced to create a fixed construct. The jig was then removed. The knee was extended and fluoroscopic imaging demonstrated near-anatomic alignment. Attention was then turned to the proximal interlocking screw where using perfect circles technique proximally the static hole was drilled measured and filled with the appropriate size anterior to  posterior interlocking screw. AP and lateral views showed appropriate screw length and nail length proximally. Final fluoroscopic views all planes showed acceptable near-anatomic reduction of the fracture and position of the screws and nail. The knee was then copiously irrigated and closed in layers with 0 Vicryl to close the arthrotomy in a running fashion, 2-0 Vicryl in a  subcutaneous layer and staples for skin closure.  Staples were used to close the interlocking screw sites. Light sterile dressing was then applied and the patient was placed in a knee immobilizer. She was allowed to awaken from anesthesia transferred to stretcher and taken to the recovery room in stable condition.  Postoperative plan: She will be readmitted to the internal medicine service. She will be touchdown weightbearing and remain the knee immobilizer for now. We will restart Lovenox tomorrow.

## 2015-10-20 NOTE — Consult Note (Signed)
Reason for Consult: Right periprosthetic distal femur fracture Referring Physician: Fatime Ruiz is an 74 y.o. female.  HPI: 74 year old female with history of left MCA infarct one week ago. She was just discharged from the hospital 2 days ago. She was at home yesterday and states that she blacked out causing a fall. She had immediate right leg pain and inability to stand or walk. She has a history of right knee replacement within the last 1-2 years in [place]. She was previously doing well with her knee replacement. She currently complains of severe right leg or knee pain with movement, better with rest. She is in a knee immobilizer.  Past Medical History  Diagnosis Date  . Hypertension   . Hyperlipidemia   . 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 Depression   . Stroke ([place])   . Chronic kidney disease     Past Surgical History  Procedure Laterality Date  . Knee surgery    . Abdominal hysterectomy    . Arm surgery    . Hemorroidectomy    . Ep implantable device N/A 10/17/2015    Procedure: Loop Recorder Insertion;  Surgeon: Will Meredith Leeds, MD;  Location: [facility];  Service: Cardiovascular;  Laterality: N/A;  . Tee without cardioversion N/A 10/17/2015    Procedure: TRANSESOPHAGEAL ECHOCARDIOGRAM (TEE);  Surgeon: Jerline Pain, MD;  Location: [facility];  Service: Cardiovascular;  Laterality: N/A;  . Cardiac catheterization  2013    negative  . Eye surgery      Family History  Problem Relation Age of Onset  . Heart disease Mother 24  . Heart disease Brother 51    Social History:  reports that she has never smoked. She has never used smokeless tobacco. She reports that she does not drink alcohol or use illicit drugs.  Allergies: No Known Allergies  Medications: I have reviewed the patient's current medications.  Results for orders placed or performed during the hospital encounter of 10/19/15 (from the past 48 hour(s))  I-stat troponin,  ED (not at [facility], [facility])     Status: None   Collection Time: 10/19/15  3:49 PM  Result Value Ref Range   Troponin i, poc 0.01 0.00 - 0.08 ng/mL   Comment 3            Comment: Due to the release kinetics of cTnI, a negative result within the first hours of the onset of symptoms does not rule out myocardial infarction with certainty. If myocardial infarction is still suspected, repeat the test at appropriate intervals.   I-Stat Chem 8, ED  (not at [facility], [facility])     Status: Abnormal   Collection Time: 10/19/15  3:51 PM  Result Value Ref Range   Sodium 139 135 - 145 mmol/L   Potassium 4.3 3.5 - 5.1 mmol/L   Chloride 106 101 - 111 mmol/L   BUN 12 6 - 20 mg/dL   Creatinine, Ser 1.10 (H) 0.44 - 1.00 mg/dL   Glucose, Bld 111 (H) 65 - 99 mg/dL   Calcium, Ion 1.12 (L) 1.13 - 1.30 mmol/L   TCO2 18 0 - 100 mmol/L   Hemoglobin 13.3 12.0 - 15.0 g/dL   HCT 39.0 36.0 - 46.0 %  Ethanol     Status: None   Collection Time: 10/19/15  3:53 PM  Result Value Ref Range   Alcohol, Ethyl (B) <5 <5 mg/dL    Comment:        LOWEST  DETECTABLE LIMIT FOR SERUM ALCOHOL IS 5 mg/dL FOR MEDICAL PURPOSES ONLY   Protime-INR     Status: None   Collection Time: 10/19/15  3:53 PM  Result Value Ref Range   Prothrombin Time 13.7 11.6 - 15.2 seconds   INR 1.03 0.00 - 1.49  APTT     Status: Abnormal   Collection Time: 10/19/15  3:53 PM  Result Value Ref Range   aPTT 23 (L) 24 - 37 seconds  CBC     Status: None   Collection Time: 10/19/15  3:53 PM  Result Value Ref Range   WBC 4.5 4.0 - 10.5 K/uL   RBC 4.25 3.87 - 5.11 MIL/uL   Hemoglobin 12.9 12.0 - 15.0 g/dL   HCT 38.5 36.0 - 46.0 %   MCV 90.6 78.0 - 100.0 fL   MCH 30.4 26.0 - 34.0 pg   MCHC 33.5 30.0 - 36.0 g/dL   RDW 13.0 11.5 - 15.5 %   Platelets 227 150 - 400 K/uL  Differential     Status: None   Collection Time: 10/19/15  3:53 PM  Result Value Ref Range   Neutrophils Relative % 64 %   Neutro Abs 2.9 1.7 - 7.7 K/uL   Lymphocytes Relative 26 %    Lymphs Abs 1.2 0.7 - 4.0 K/uL   Monocytes Relative 6 %   Monocytes Absolute 0.3 0.1 - 1.0 K/uL   Eosinophils Relative 4 %   Eosinophils Absolute 0.2 0.0 - 0.7 K/uL   Basophils Relative 0 %   Basophils Absolute 0.0 0.0 - 0.1 K/uL  Comprehensive metabolic panel     Status: Abnormal   Collection Time: 10/19/15  3:53 PM  Result Value Ref Range   Sodium 136 135 - 145 mmol/L   Potassium 4.2 3.5 - 5.1 mmol/L   Chloride 102 101 - 111 mmol/L   CO2 20 (L) 22 - 32 mmol/L   Glucose, Bld 115 (H) 65 - 99 mg/dL   BUN 12 6 - 20 mg/dL   Creatinine, Ser 1.25 (H) 0.44 - 1.00 mg/dL   Calcium 9.2 8.9 - 10.3 mg/dL   Total Protein 6.1 (L) 6.5 - 8.1 g/dL   Albumin 3.6 3.5 - 5.0 g/dL   AST 58 (H) 15 - 41 U/L   ALT 36 14 - 54 U/L   Alkaline Phosphatase 53 38 - 126 U/L   Total Bilirubin 0.9 0.3 - 1.2 mg/dL   GFR calc non Af Amer 41 (L) >60 mL/min   GFR calc Af Amer 48 (L) >60 mL/min    Comment: (NOTE) The eGFR has been calculated using the CKD EPI equation. This calculation has not been validated in all clinical situations. eGFR's persistently <60 mL/min signify possible Chronic Kidney Disease.    Anion gap 14 5 - 15  Lipid panel     Status: Abnormal   Collection Time: 10/20/15  2:25 AM  Result Value Ref Range   Cholesterol 165 0 - 200 mg/dL   Triglycerides 55 <150 mg/dL   HDL 54 >40 mg/dL   Total CHOL/HDL Ratio 3.1 RATIO   VLDL 11 0 - 40 mg/dL   LDL Cholesterol 100 (H) 0 - 99 mg/dL    Comment:        Total Cholesterol/HDL:CHD Risk Coronary Heart Disease Risk Table                     Men   Women  1/2 Average Risk   3.4   3.3  Average Risk       5.0   4.4  2 X Average Risk   9.6   7.1  3 X Average Risk  23.4   11.0        Use the calculated Patient Ratio above and the CHD Risk Table to determine the patient's CHD Risk.        ATP III CLASSIFICATION (LDL):  <100     mg/dL   Optimal  100-129  mg/dL   Near or Above                    Optimal  130-159  mg/dL   Borderline  160-189  mg/dL    High  >190     mg/dL   Very High     Ct Head Wo Contrast  10/19/2015  ADDENDUM REPORT: 10/19/2015 16:10 ADDENDUM: Critical Value/emergent results were called by telephone at the time of interpretation on 10/19/2015 at 4:09 pm to Dr. Nicole Kindred , who verbally acknowledged these results. Electronically Signed   By: Marijo Conception, M.D.   On: 10/19/2015 16:10  10/19/2015  CLINICAL DATA:  Aphasia.  Status post fall. EXAM: CT HEAD WITHOUT CONTRAST TECHNIQUE: Contiguous axial images were obtained from the base of the skull through the vertex without intravenous contrast. COMPARISON:  CT scan of October 15, 2015. FINDINGS: Bony calvarium appears intact. Mild diffuse cortical atrophy is noted. Mild chronic ischemic white matter disease is noted. Old left cerebellar infarction is again noted. No mass effect or midline shift is noted. Ventricular size is within normal limits. There is no evidence of mass lesion, hemorrhage or acute infarction. IMPRESSION: Mild diffuse cortical atrophy. Mild chronic ischemic white matter disease. Old left cerebellar infarction. No acute intracranial abnormality seen. Electronically Signed: By: Marijo Conception, M.D. On: 10/19/2015 16:04   Ct Cervical Spine Wo Contrast  10/19/2015  CLINICAL DATA:  Status post fall, neck pain EXAM: CT CERVICAL SPINE WITHOUT CONTRAST TECHNIQUE: Multidetector CT imaging of the cervical spine was performed without intravenous contrast. Multiplanar CT image reconstructions were also generated. COMPARISON:  None. FINDINGS: Mild straightening of the cervical spine, likely positional. No evidence of fracture or dislocation. Vertebral body heights are maintained. Dens appears intact. No prevertebral soft tissue swelling. Mild to moderate multilevel degenerative changes. Visualized thyroid is unremarkable. Visualized lung apices are notable for a 4 mm nodule on the right (series 201/ image 81). IMPRESSION: No evidence of traumatic injury to the cervical spine.  Mild to moderate degenerative changes. 4 mm pulmonary nodule in the right lung apex. If this patient is high risk for primary bronchogenic neoplasm, a single follow-up CT chest is suggested in 1 year. If low risk, no dedicated follow-up imaging is required. Electronically Signed   By: Julian Hy M.D.   On: 10/19/2015 17:40   Ct Pelvis Wo Contrast  10/19/2015  CLINICAL DATA:  74 year old female acute bilateral hip pain following fall. Initial encounter. EXAM: CT PELVIS WITHOUT CONTRAST TECHNIQUE: Multidetector CT imaging of the pelvis was performed following the standard protocol without intravenous contrast. COMPARISON:  10/19/2015 radiographs FINDINGS: There is no evidence of acute fracture, subluxation or dislocation. Mild degenerative changes in both hips are noted. Mild to moderate degenerative changes at L5-S1 present. No soft tissue abnormalities are identified. IMPRESSION: No evidence of acute bony abnormality. Electronically Signed   By: Margarette Canada M.D.   On: 10/19/2015 19:31   Mr Brain Wo Contrast  10/20/2015  CLINICAL DATA:  Initial evaluation for  acute fall, possible worsening of expressive aphasia. Recent stroke. EXAM: MRI HEAD WITHOUT CONTRAST TECHNIQUE: Multiplanar, multiecho pulse sequences of the brain and surrounding structures were obtained without intravenous contrast. COMPARISON:  Prior MRI from 10/15/2015. FINDINGS: Patchy diffusion abnormality related to recent left MCA territory infarcts again seen. There has been interval evolution of these infarcts, with some normalization of the ADC signal as compared to prior. However, there appear to be several new superimposed cortical and subcortical infarcts in the same region which have developed since previous study. These are now all best appreciated as the most prevalent hypo intense signal seen on ADC map (Series 400, image 31). No associated hemorrhage or mass effect. Major intravascular flow voids are maintained. Atrophy with  mild chronic small vessel ischemic disease again noted. Remote lacunar infarcts within the right centrum semi ovale/corona radiata again noted. Remote cortical infarct within the parasagittal left occipital lobe. Bilateral remote cerebellar infarcts, left greater than right. No mass lesion, midline shift, or mass effect. No hydrocephalus. No extra-axial fluid collection. Craniocervical junction within normal limits. Pituitary gland normal. No acute abnormality about the orbits. Mucosal thickening throughout the paranasal sinuses. Small air-fluid levels within the bilateral maxillary sinuses. Trace opacity within the inferior left mastoid air cells. Inner ear structures grossly normal. Bone marrow signal intensity within normal limits. Scalp soft tissues demonstrate no acute abnormality. IMPRESSION: 1. Interval evolution of recently identified acute left MCA territory infarcts, with several new scattered cortical and subcortical ischemic infarcts superimposed in the same region of the posterior left frontal lobe. No associated mass effect or hemorrhage. 2. Chronic infarcts as detailed above, stable. Electronically Signed   By: Jeannine Boga M.D.   On: 10/20/2015 02:59   Dg Knee Complete 4 Views Right  10/19/2015  CLINICAL DATA:  Right knee pain status post fall. Stroke 0 couple weeks ago. EXAM: RIGHT KNEE - COMPLETE 4+ VIEW COMPARISON:  None. FINDINGS: Patient is status post total knee arthroplasty. The hardware appears intact and well positioned. There is an acute displaced fracture of the distal right femur, obliquely oriented with approximately 1 cm fracture displacement. Lateral view shows that there is also impaction at the fracture site. There is an associated joint effusion within the adjacent suprapatellar bursa. IMPRESSION: 1. Acute displaced fracture of the distal right femur, just above the right knee arthroplasty hardware, with approximately 1 cm fracture displacement. There is associated  angulation deformity at the fracture site and impaction at the fracture site. 2. Status post total knee arthroplasty. Hardware appears intact and well positioned. Electronically Signed   By: Franki Cabot M.D.   On: 10/19/2015 19:47   Dg Hip Unilat With Pelvis 2-3 Views Right  10/19/2015  CLINICAL DATA:  Severe pain status post unwitnessed fall EXAM: DG HIP (WITH OR WITHOUT PELVIS) 2-3V RIGHT COMPARISON:  08/05/2015 FINDINGS: No fracture or dislocation is seen. Mild degenerative changes. Visualized bony pelvis appears intact. IMPRESSION: No fracture or dislocation is seen. Electronically Signed   By: Julian Hy M.D.   On: 10/19/2015 17:27    Review of Systems  Unable to perform ROS: medical condition   Blood pressure 121/64, pulse 81, temperature 98.6 F (37 C), temperature source Oral, resp. rate 13, height $RemoveBe'5\' 4"'HKlZFTMRe$  (1.626 m), weight 71.532 kg (157 lb 11.2 oz), SpO2 94 %. Physical Exam  Constitutional: She is oriented to person, place, and time. She appears well-developed and well-nourished.  HENT:  Head: Atraumatic.  Cardiovascular: Intact distal pulses.   Respiratory: Effort normal.  Musculoskeletal:  R Knee with prior TKA incision.  Mod swelling distal thigh, skin intact, TTP.  Distally can DF/PF ankle with mild weakness, intact STLT dorsal plantar.  Neurological: She is alert and oriented to person, place, and time.  Skin: Skin is warm and dry.    Assessment/Plan: 74 year old female with recent left-sided stroke and new right periprosthetic distal femur fracture I recommend surgical fixation to promote anatomic healing and early ambulation. I spoke with Dr. Cruzita Lederer who feels she will likely be able to go forward with surgery today but recommended preoperative clearance from neurology. Once we have agreement that surgery is acceptable risk, we will go forward with retrograde intramedullary nail fixation. She should remain nothing by mouth today. I discussed the surgery with her.  No other family was present to discuss this case.  Nita Sells 10/20/2015, 8:20 AM

## 2015-10-20 NOTE — Progress Notes (Signed)
PT Cancellation Note  Patient Details Name: Nicole Ruiz MRN: [id number] DOB: 18-Apr-1941   Cancelled Treatment:    Reason Eval/Treat Not Completed: Patient at procedure or test/unavailable.  Pt to OR today for fx hip.   Will see for eval 10/24 once new activity orders received.  10/20/2015  [name], [place] [phone]  (pager)   [name], Tessie Fass 10/20/2015, 12:24 PM

## 2015-10-20 NOTE — Evaluation (Signed)
SLP Cancellation Note  Patient Details Name: Nicole Ruiz MRN: [id number] DOB: 05-16-1941   Cancelled treatment:       Reason Eval/Treat Not Completed: Other (comment) (will conduct SLE next date as ordered. Thanks.)   Luanna Salk, [facility] SLP [phone]

## 2015-10-20 NOTE — Anesthesia Preprocedure Evaluation (Addendum)
Anesthesia Evaluation  Patient identified by MRN, date of birth, ID band Patient awake    Reviewed: Allergy & Precautions, NPO status , Patient's Chart, lab work & pertinent test results  Airway Mallampati: II  TM Distance: >3 FB Neck ROM: Full    Dental  (+) Edentulous Upper, Edentulous Lower, Dental Advisory Given   Pulmonary neg pulmonary ROS,    breath sounds clear to auscultation       Cardiovascular hypertension, Pt. on home beta blockers and Pt. on medications  Rhythm:Regular Rate:Normal     Neuro/Psych Depression CVA, Residual Symptoms    GI/Hepatic Neg liver ROS, GERD  ,  Endo/Other  negative endocrine ROS  Renal/GU CRFRenal disease     Musculoskeletal   Abdominal   Peds  Hematology negative hematology ROS (+)   Anesthesia Other Findings   Reproductive/Obstetrics                          Lab Results  Component Value Date   WBC 4.5 10/19/2015   HGB 12.9 10/19/2015   HCT 38.5 10/19/2015   MCV 90.6 10/19/2015   PLT 227 10/19/2015   Lab Results  Component Value Date   CREATININE 1.25* 10/19/2015   BUN 12 10/19/2015   NA 136 10/19/2015   K 4.2 10/19/2015   CL 102 10/19/2015   CO2 20* 10/19/2015    Anesthesia Physical Anesthesia Plan  ASA: III  Anesthesia Plan: General   Post-op Pain Management:    Induction: Intravenous  Airway Management Planned: Oral ETT  Additional Equipment:   Intra-op Plan:   Post-operative Plan: Extubation in OR  Informed Consent: I have reviewed the patients History and Physical, chart, labs and discussed the procedure including the risks, benefits and alternatives for the proposed anesthesia with the patient or authorized representative who has indicated his/her understanding and acceptance.     Plan Discussed with: CRNA  Anesthesia Plan Comments:        Anesthesia Quick Evaluation

## 2015-10-20 NOTE — Progress Notes (Signed)
STROKE TEAM PROGRESS NOTE   HISTORY Nicole Ruiz is a 74 y.o. female with a history of previous strokes, chronic kidney disease, palpitations, hyperlipidemia, hypertension, and depression. She was discharged from [facility] 10/17/2015 after admission 10/15/2015 with right hemiparesis and speech difficulties. 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 Strokes felt to be embolic. A TEE revealed a PFO. Lower extremity Dopplers were negative. A loop was implanted. The patient was treated with aspirin 325 mg daily. She was to have home health physical therapy.  Today the patient was at home when her right leg gave out and she fell. She was noted to be aphasic by EMS and she was brought in as a code stroke. Difficult history secondary to aphasia. Initial head CT showed no acute findings. The patient was having significant right lower extremity pain - suspect fracture. C-spine imaging and right lower extremity films ordered. She will be admitted for further workup. Her NIH score was a 2. TPA was not considered secondary to her recent stroke. Last seen normal at 1330  Date last known well: Date: 10/19/2015 Time last known well: Time: 13:30 tPA Given: No: Recent stroke with possible lower extremity fracture.   SUBJECTIVE (INTERVAL HISTORY) No family members present. The patient is feeling better today. She is still having pain in her right lower extremity. Her speech is much improved. We briefly discussed surgical repair of her femur fracture. She would like to proceed with surgery if okay from the stroke team standpoint. This has apparently been tentatively scheduled for 2 PM today.   OBJECTIVE Temp:  [97.5 F (36.4 C)-98.6 F (37 C)] 98.6 F (37 C) (10/22 2345) Pulse Rate:  [67-92] 81 (10/23 7893) Cardiac Rhythm:  [-] Normal sinus rhythm (10/23 0800) Resp:  [13-30] 13 (10/23 0632) BP: (89-141)/(54-90) 121/64 mmHg (10/23 0632) SpO2:  [94  %-100 %] 94 % (10/23 8101) Weight:  [71.532 kg (157 lb 11.2 oz)] 71.532 kg (157 lb 11.2 oz) (10/22 2144)  CBC:  Recent Labs Lab 10/15/15 1541 10/16/15 0515 10/19/15 1551 10/19/15 1553  WBC 4.2 5.1  --  4.5  NEUTROABS 2.5  --   --  2.9  HGB 12.5 11.9* 13.3 12.9  HCT 37.4 34.8* 39.0 38.5  MCV 92.3 91.3  --  90.6  PLT 266 236  --  751    Basic Metabolic Panel:  Recent Labs Lab 10/16/15 0515 10/19/15 1551 10/19/15 1553  NA 139 139 136  K 3.9 4.3 4.2  CL 107 106 102  CO2 23  --  20*  GLUCOSE 76 111* 115*  BUN 14 12 12   CREATININE 1.15* 1.10* 1.25*  CALCIUM 8.7*  --  9.2    Lipid Panel:    Component Value Date/Time   CHOL 165 10/20/2015 0225   TRIG 55 10/20/2015 0225   HDL 54 10/20/2015 0225   CHOLHDL 3.1 10/20/2015 0225   VLDL 11 10/20/2015 0225   LDLCALC 100* 10/20/2015 0225   HgbA1c:  Lab Results  Component Value Date   HGBA1C 5.8* 10/15/2015   Urine Drug Screen: No results found for: LABOPIA, COCAINSCRNUR, LABBENZ, AMPHETMU, THCU, LABBARB    IMAGING  Ct Head Wo Contrast 10/19/2015   Mild diffuse cortical atrophy. Mild chronic ischemic white matter disease. Old left cerebellar infarction. No acute intracranial abnormality seen.    Ct Cervical Spine Wo Contrast 10/19/2015   No evidence of traumatic injury to the cervical spine. Mild to moderate degenerative changes.  4 mm pulmonary nodule in the right lung apex. If this patient is high risk for primary bronchogenic neoplasm, a single follow-up CT chest is suggested in 1 year. If low risk, no dedicated follow-up imaging is required.   Ct Pelvis Wo Contrast 10/19/2015   No evidence of acute bony abnormality.  Mr Brain Wo Contrast 10/20/2015   1. Interval evolution of recently identified acute left MCA territory infarcts, with several new scattered cortical and subcortical ischemic infarcts superimposed in the same region of the posterior left frontal lobe. No associated mass effect or hemorrhage.  2.  Chronic infarcts as detailed above, stable.    Dg Knee Complete 4 Views Right 10/19/2015   1. Acute displaced fracture of the distal right femur, just above the right knee arthroplasty hardware, with approximately 1 cm fracture displacement. There is associated angulation deformity at the fracture site and impaction at the fracture site.  2. Status post total knee arthroplasty. Hardware appears intact and well positioned.    Dg Hip Unilat With Pelvis 2-3 Views Right 10/19/2015   No fracture or dislocation is seen   PHYSICAL EXAM Mental Status: Alert, oriented to person and place. 3 mild aphasia - improved from last night. Able to follow 3 step commands. Cranial Nerves: II: Visual fields grossly normal, pupils equal, round, reactive to light and accommodation III,IV, VI: ptosis not present, extra-ocular motions intact bilaterally V,VII: smile symmetric, facial light touch sensation normal bilaterally VIII: hearing normal bilaterally IX,X: gag reflex present XI: bilateral shoulder shrug XII: midline tongue extension Motor: Strength 4-5 over 5 in all extremities except for the right lower extremity which could not be tested secondary to pain. Tone and bulk:normal tone throughout; no atrophy noted Sensory: Light touch intact throughout, bilaterally Deep Tendon Reflexes: 2+ and symmetric throughout Plantars: Right: muteLeft: downgoing Cerebellar: normal finger-to-nose Gait: Could not be tested  ASSESSMENT/PLAN [name] is a 74 y.o. female with history of previous strokes, chronic kidney disease, palpitations, hyperlipidemia, hypertension, and depression. presenting after falling at home when her right leg gave out. Found to have a right femur fracture as well as new left frontal lobe infarcts.  She did not receive IV t-PA due to recent previous stroke.  Strokes:  Dominant infarcts believed to be embolic from an unknown source.  Resultant -  word finding difficulties.  MRI  acute and subacute left MCA infarcts.   MRA  - 10/15/2015 - Marked attenuation of left MCA M2 branch vessels/ subtotal occlusion of left M2 trunks with occlusion of more distal branch vessels.  Carotid Doppler 10/16/2015 - Bilateral: 1-39% ICA stenosis. Vertebral artery flow is antegrade.   TEE - 10/17/2015 - EF 55-60%. Atrial septum: The interatrial septum was hypermobile. There was a patent foramen ovale. Bubble study positive. There was an atrial septal aneurysm.  Lower extremity Dopplers - 10/17/2015 - no evidence of DVT or superficial thrombosis.  LDL - 100  HgbA1c - 10/15/2015 - 5.8  VTE prophylaxis - Lovenox  Diet NPO time specified  aspirin 325 mg daily prior to admission, now on aspirin 325 mg daily  Patient counseled to be compliant with her antithrombotic medications  Ongoing aggressive stroke risk factor management  Therapy recommendations:  Pending  Disposition: Pending  Hypertension  Blood pressure occasionally low - 89/60 - currently not on antihypertensive medications however she is receiving pain medications. Permissive hypertension (OK if < 220/120) but gradually normalize in 5-7 days  Hyperlipidemia  Home meds:  Lipitor 40 mg daily resumed in  hospital  LDL 100, goal < 70  Lipitor was recently started. No adjustment indicated at this time.  Continue statin at discharge   Other Stroke Risk Factors  Advanced age  Hx stroke/TIA   Other Active Problems  Right femur fracture - possible surgical repair tentatively scheduled for 2 PM today. OK from neurology standpoint for the surgery, need to take care not to drop blood pressure, try to maintain >159 systolic  We will need DVT prophylaxis following surgery  4 mm pulmonary nodule in the right lung apex. No tobacco history. Will need to discuss secondhand smoke exposure.  History of chronic kidney disease. Creatinine 1.25.  Interrogate loop placed last  admission.  Hospital day # 1  Mikey Bussing [facility] Pager [phone] 10/20/2015, 9:35 AM  Lenor Coffin Phone [phone]    To contact Stroke Continuity provider, please refer to [email or web address] After hours, contact General Neurology

## 2015-10-20 NOTE — Anesthesia Postprocedure Evaluation (Signed)
  Anesthesia Post-op Note  Patient: Nicole Ruiz  Procedure(s) Performed: Procedure(s): INTRAMEDULLARY (IM) RETROGRADE FEMORAL NAILING (Right)  Patient Location: PACU  Anesthesia Type:General  Level of Consciousness: awake and alert   Airway and Oxygen Therapy: Patient Spontanous Breathing  Post-op Pain: mild  Post-op Assessment: Post-op Vital signs reviewed LLE Motor Response: Purposeful movement, Responds to commands LLE Sensation: Full sensation RLE Motor Response: Purposeful movement RLE Sensation: Full sensation, Pain      Post-op Vital Signs: Reviewed  Last Vitals:  Filed Vitals:   10/20/15 1855  BP: 102/50  Pulse: 88  Temp:   Resp: 14    Complications: No apparent anesthesia complications

## 2015-10-21 ENCOUNTER — Encounter (HOSPITAL_COMMUNITY): Payer: Self-pay | Admitting: Orthopedic Surgery

## 2015-10-21 ENCOUNTER — Inpatient Hospital Stay (HOSPITAL_COMMUNITY): Payer: Medicare HMO

## 2015-10-21 DIAGNOSIS — N183 Chronic kidney disease, stage 3 (moderate): Secondary | ICD-10-CM

## 2015-10-21 DIAGNOSIS — D62 Acute posthemorrhagic anemia: Secondary | ICD-10-CM

## 2015-10-21 DIAGNOSIS — S72401D Unspecified fracture of lower end of right femur, subsequent encounter for closed fracture with routine healing: Secondary | ICD-10-CM

## 2015-10-21 DIAGNOSIS — W19XXXD Unspecified fall, subsequent encounter: Secondary | ICD-10-CM

## 2015-10-21 DIAGNOSIS — I638 Other cerebral infarction: Secondary | ICD-10-CM

## 2015-10-21 DIAGNOSIS — W19XXXS Unspecified fall, sequela: Secondary | ICD-10-CM

## 2015-10-21 LAB — BASIC METABOLIC PANEL
ANION GAP: 11 (ref 5–15)
BUN: 12 mg/dL (ref 6–20)
CO2: 18 mmol/L — ABNORMAL LOW (ref 22–32)
Calcium: 8.2 mg/dL — ABNORMAL LOW (ref 8.9–10.3)
Chloride: 108 mmol/L (ref 101–111)
Creatinine, Ser: 1.08 mg/dL — ABNORMAL HIGH (ref 0.44–1.00)
GFR calc Af Amer: 57 mL/min — ABNORMAL LOW (ref >60)
GFR, EST NON AFRICAN AMERICAN: 49 mL/min — AB (ref >60)
Glucose, Bld: 113 mg/dL — ABNORMAL HIGH (ref 65–99)
POTASSIUM: 3.8 mmol/L (ref 3.5–5.1)
SODIUM: 137 mmol/L (ref 135–145)

## 2015-10-21 LAB — CBC
HCT: 23.5 % — ABNORMAL LOW (ref 36.0–46.0)
Hemoglobin: 7.8 g/dL — ABNORMAL LOW (ref 12.0–15.0)
MCH: 30.4 pg (ref 26.0–34.0)
MCHC: 33.2 g/dL (ref 30.0–36.0)
MCV: 91.4 fL (ref 78.0–100.0)
Platelets: 150 10*3/uL (ref 150–400)
RBC: 2.57 MIL/uL — ABNORMAL LOW (ref 3.87–5.11)
RDW: 13.4 % (ref 11.5–15.5)
WBC: 5.9 10*3/uL (ref 4.0–10.5)

## 2015-10-21 LAB — ABO/RH: ABO/RH(D): A POS

## 2015-10-21 LAB — HEMOGLOBIN A1C
Hgb A1c MFr Bld: 5.8 % — ABNORMAL HIGH (ref 4.8–5.6)
Mean Plasma Glucose: 120 mg/dL

## 2015-10-21 LAB — GLUCOSE, CAPILLARY: Glucose-Capillary: 108 mg/dL — ABNORMAL HIGH (ref 65–99)

## 2015-10-21 LAB — PREPARE RBC (CROSSMATCH)

## 2015-10-21 MED ORDER — HYDROCODONE-ACETAMINOPHEN 5-325 MG PO TABS
1.0000 | ORAL_TABLET | Freq: Four times a day (QID) | ORAL | Status: DC | PRN
  Administered 2015-10-21 – 2015-10-22 (×2): 1 via ORAL
  Filled 2015-10-21 (×2): qty 1

## 2015-10-21 MED ORDER — POLYETHYLENE GLYCOL 3350 17 G PO PACK
17.0000 g | PACK | Freq: Two times a day (BID) | ORAL | Status: DC
  Administered 2015-10-21 – 2015-10-22 (×3): 17 g via ORAL
  Filled 2015-10-21 (×2): qty 1

## 2015-10-21 MED ORDER — ENOXAPARIN SODIUM 40 MG/0.4ML ~~LOC~~ SOLN: 40.0000 mg | SUBCUTANEOUS | Status: DC

## 2015-10-21 MED ORDER — OXYCODONE-ACETAMINOPHEN 5-325 MG PO TABS: 1.0000 | ORAL_TABLET | ORAL | Status: DC | PRN

## 2015-10-21 MED ORDER — SODIUM CHLORIDE 0.9 % IV SOLN: Freq: Once | INTRAVENOUS | Status: DC

## 2015-10-21 MED ORDER — GLYCERIN (LAXATIVE) 2.1 G RE SUPP
1.0000 | Freq: Every day | RECTAL | Status: DC | PRN
  Filled 2015-10-21: qty 1

## 2015-10-21 NOTE — Progress Notes (Signed)
EEG Completed; Results Pending  

## 2015-10-21 NOTE — Progress Notes (Signed)
   PATIENT ID: Nicole Ruiz   1 Day Post-Op Procedure(s) (LRB): INTRAMEDULLARY (IM) RETROGRADE FEMORAL NAILING (Right)  Subjective: Doing well this am. Some pain right knee. No other problems or concerns.   Objective:  Filed Vitals:   10/21/15 0732  BP: 108/50  Pulse: 84  Temp: 98.4 F (36.9 C)  Resp: 27     R knee immobilizer removed, dressing c/d/i Mild swelling right knee ROM ankle, distally NVI  Labs:   Recent Labs  10/19/15 1551 10/19/15 1553 10/21/15 0350  HGB 13.3 12.9 7.8*   Recent Labs  10/19/15 1553 10/21/15 0350  WBC 4.5 5.9  RBC 4.25 2.57*  HCT 38.5 23.5*  PLT 227 150   Recent Labs  10/19/15 1553 10/21/15 0350  NA 136 137  K 4.2 3.8  CL 102 108  CO2 20* 18*  BUN 12 12  CREATININE 1.25* 1.08*  GLUCOSE 115* 113*  CALCIUM 9.2 8.2*    Assessment and Plan: 1 day s/p right retrograde nail distal femur fracture  Touchdown weight bearing Continue current pain mgmt  Lovenox daily for DVT prophylaxis due to recent CVA history, SCDs
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
## 2015-10-21 NOTE — Care Management Note (Addendum)
Case Management Note  Patient Details  Name: SIEDAH SEDOR MRN: [id number] Date of Birth: Sep 25, 1941  Subjective/Objective: Pt admitted for fall- S/p INTRAMEDULLARY (IM) RETROGRADE FEMORAL NAILING (Right). Pt was previously from home. Recent admission. PT/OT to consult for disposition needs.                     Action/Plan: CM will continue to monitor for additional needs.    Expected Discharge Date:                  Expected Discharge Plan:  Skilled Nursing Facility  In-House Referral:  Clinical Social Work  Discharge planning Services  CM Consult  Post Acute Care Choice:    Choice offered to:     DME Arranged:    DME Agency:     HH Arranged:    [place] Agency:     Status of Service:  In process, will continue to follow  Medicare Important Message Given:    Date Medicare IM Given:    Medicare IM give by:    Date Additional Medicare IM Given:    Additional Medicare Important Message give by:     If discussed at [place] of Stay Meetings, dates discussed:    Additional Comments:  Bethena Roys, RN 10/21/2015, 10:27 AM

## 2015-10-21 NOTE — Evaluation (Addendum)
Occupational Therapy Evaluation Patient Details Name: Nicole Ruiz MRN: [id number] DOB: 18-Jul-1941 Today's Date: 10/21/2015    History of Present Illness Patient is a 74 y.o. female d/ced on 10/20 with L MCA infarct and aphasia and readmiited 10/22 s/p fall at home due to distal femur fx now s/p IM nail right distal femur. PMH includes HTN, HLD, CKD, depression.   Clinical Impression   Pt s/p above. Pt independent with ADLs, PTA. Feel pt will benefit from acute OT to increase independence prior to d/c. Recommending SNF for rehab.    Follow Up Recommendations  SNF    Equipment Recommendations  Other (comment) (defer to next venue)    Recommendations for Other Services       Precautions / Restrictions Precautions Precautions: Fall Precaution Comments: R sided weakness Required Braces or Orthoses: Knee Immobilizer - Right Knee Immobilizer - Right: On at all times (unless otherwise stated) Restrictions Weight Bearing Restrictions: Yes RLE Weight Bearing: Touchdown weight bearing (per order, however pt states MD told her NWB) Other Position/Activity Restrictions: TDWB; no order for knee AROM vs when to wear KI      Mobility Bed Mobility Overal bed mobility: Needs Assistance Bed Mobility: Supine to Sit;Sit to Supine     Supine to sit: Min assist Sit to supine: Min assist   General bed mobility comments: assist with RLE. Trendelenburg position used to help with scooting HOB.  Transfers Overall transfer level: Needs assistance Equipment used: Rolling walker (2 wheeled) Transfers: Sit to/from Stand Sit to Stand: Max assist              Balance   Reliance on RW for standing- assist given. Balance not formally assessed.                                          ADL Overall ADL's : Needs assistance/impaired     Grooming: Set up;Sitting               Lower Body Dressing: Maximal assistance;Sit to/from stand   Toilet Transfer:  RW;Maximal assistance (sit to stand from bed)             General ADL Comments: Pt stood from EOB with heavy assist. Explained reacher can be used for dressing.     Vision     Perception     Praxis      Pertinent Vitals/Pain Pain Assessment: 0-10 Pain Score: 5  Pain Location: RLE with movement Pain Intervention(s): Monitored during session;Repositioned     Hand Dominance Right   Extremity/Trunk Assessment Upper Extremity Assessment Upper Extremity Assessment: RUE deficits/detail RUE Deficits / Details: residual right sided weakness   Lower Extremity Assessment Lower Extremity Assessment: Defer to PT evaluation       Communication Communication Communication: Expressive difficulties   Cognition Arousal/Alertness: Awake/alert Behavior During Therapy: WFL for tasks assessed/performed Overall Cognitive Status: No family/caregiver present to determine baseline cognitive functioning-pt also with expressive difficulties     General Comments       Exercises       Shoulder Instructions      Home Living Family/patient expects to be discharged to:: Skilled nursing facility Living Arrangements: Spouse/significant other Available Help at Discharge: Family;Available 24 hours/day Type of Home: House Home Access: Stairs to enter [employer] of Steps: 3-5   Home Layout: Two level Alternate Level Stairs-Number of Steps: 3   Bathroom  Shower/Tub: Teacher, early years/pre: Standard Bathroom Accessibility: Yes   Home Equipment: Environmental consultant - 2 wheels;Bedside commode;Shower seat;Cane - single point;Adaptive equipment Adaptive Equipment: Reacher    Lives With: Spouse    Prior Functioning/Environment Level of Independence: Independent with assistive device(s)        Comments: Used cane all the time. Reports fall at home after discharge and does not know how it happened.    OT Diagnosis: Acute pain   OT Problem List: Decreased range of  motion;Pain;Decreased knowledge of precautions;Decreased knowledge of use of DME or AE;Impaired balance (sitting and/or standing)   OT Treatment/Interventions: DME and/or AE instruction;Patient/family education;Balance training;Self-care/ADL training;Therapeutic activities    OT Goals(Current goals can be found in the care plan section) Acute Rehab OT Goals Patient Stated Goal: not stated OT Goal Formulation: With patient Time For Goal Achievement: 10/28/15 Potential to Achieve Goals: Good ADL Goals Pt Will Perform Lower Body Bathing: with min assist;with adaptive equipment;sit to/from stand Pt Will Perform Lower Body Dressing: with min assist;sit to/from stand;with adaptive equipment Pt Will Transfer to Toilet: stand pivot transfer;bedside commode;with mod assist Pt Will Perform Toileting - Clothing Manipulation and hygiene: sit to/from stand;with min assist  OT Frequency: Min 2X/week   Barriers to D/C:            Co-evaluation              End of Session Equipment Utilized During Treatment: Gait belt;Rolling walker;Right knee immobilizer Nurse Communication: Other (comment) (Line leaking blood)  Activity Tolerance: Patient tolerated treatment well Patient left: in bed;with call bell/phone within reach;with bed alarm set   Time: 6381-7711 OT Time Calculation (min): 14 min Charges:  OT General Charges $OT Visit: 1 Procedure OT Evaluation $Initial OT Evaluation Tier I: 1 Procedure G-CodesBenito Mccreedy OTR/L [id number] 10/21/2015, 5:26 PM

## 2015-10-21 NOTE — Clinical Social Work Note (Signed)
Clinical Social Work Assessment  Patient Details  Name: Nicole Ruiz MRN: [id number] Date of Birth: [birth year]  Date of referral:  10/14/15               Reason for consult:  Facility Placement, Discharge Planning                Permission sought to share information with:  Family Supports, Chartered certified accountant granted to share information::  Yes, Verbal Permission Granted  Name::     Camera operator::  SNFs  Relationship::     Contact Information:     Housing/Transportation Living arrangements for the past 2 months:  Single Family Home Source of Information:  Patient Patient Interpreter Needed:  None Criminal Activity/Legal Involvement Pertinent to Current Situation/Hospitalization:  No - Comment as needed Significant Relationships:  Spouse Lives with:  Spouse Do you feel safe going back to the place where you live?  Yes Need for family participation in patient care:  Yes (Comment)  Care giving concerns:  Patient has no concerns, but husband shares he thinks the patient is going to have to go to SNF at discharge.   Social Worker assessment / plan:  CSW met with patient and patient's husband at bedside to complete assessment. Patient states that she is not agreeable to any king of SNF placement and "will not be convinced." Patient's husband plans to discuss discharge plan with the patient as he feels the patient has to go to SNF. CSW explained that if the patient is competent to make that decision, CSW cannot force her to go to a facility. CSW explained SNF search/placement process and answered their questions, but the patient still refuses.  Employment status:  Disabled (Comment on whether or not currently receiving Disability), Retired Forensic scientist:    PT Recommendations:  [facility] / Referral to community resources:  [place]  Patient/Family's Response to care:  Patient is rejecting the recommendation for  SNF placement though the husband agrees with this recommendation.   Patient/Family's Understanding of and Emotional Response to Diagnosis, Current Treatment, and Prognosis:  Patient appears to understand why she was admitted to the hospital and understands her diagnosis. The patient does not appear to be realistic about her capabilities with ADLs and mobility as she wants to return home but is unable to transfer or ambulate without significant assistance.  Emotional Assessment Appearance:  Appears stated age Attitude/Demeanor/Rapport:  Other (Patient is withdrawn) Affect (typically observed):  Guarded, Withdrawn Orientation:  Oriented to Self, Oriented to Place, Oriented to  Time, Oriented to Situation Alcohol / Substance use:  Never Used Psych involvement (Current and /or in the community):  No (Comment)  Discharge Needs  Concerns to be addressed:  Discharge Planning Concerns Readmission within the last 30 days:  Yes Current discharge risk:  Physical Impairment, Chronically ill Barriers to Discharge:  Continued Medical Work up   [employer] MSW, [place], [place], [phone]

## 2015-10-21 NOTE — Procedures (Signed)
ELECTROENCEPHALOGRAM REPORT   Patient: Nicole Ruiz      Room #: 1D-40 Age: 74 y.o.        Sex: female Referring Physician: Dr Cruzita Lederer Report Date:  10/21/2015        Interpreting Physician: Hulen Luster  History: [name] is an 74 y.o. female admitted with falls  Medications:  Scheduled: . sodium chloride   Intravenous Once  . atorvastatin  40 mg Oral q1800  . enoxaparin (LOVENOX) injection  40 mg Subcutaneous Q24H  . nortriptyline  25 mg Oral QHS  . polyethylene glycol  17 g Oral BID    Conditions of Recording:  This is a 16 channel EEG carried out with the patient in the awake state.  Description:  The waking background activity consists of a low voltage, symmetrical, fairly well organized, 8-9 Hz alpha activity, seen from the parieto-occipital and posterior temporal regions.  Low voltage fast activity, poorly organized, is seen anteriorly and is at times superimposed on more posterior regions.  No focal slowing or epileptiform activity is noted.The patient drowses with slowing to irregular, low voltage theta and beta activity. Normal sleep architecture is not observed.   Hyperventilation was not performed. Intermittent photic stimulation was not performed.   IMPRESSION: Normal electroencephalogram. There are no focal lateralizing or epileptiform features.   Jim Like, DO [facility] [phone]  If 7pm- 7am, please page neurology on call as listed in [place]. 10/21/2015, 10:50 AM

## 2015-10-21 NOTE — Progress Notes (Signed)
UR Completed Jadzia Ibsen Graves-Bigelow, RN,BSN [phone]  

## 2015-10-21 NOTE — Progress Notes (Signed)
PROGRESS NOTE  DOROTEA HAND KDT:[id number] DOB: Sep 27, 1941 DOA: 10/19/2015 PCP: Phineas Inches, MD  HPI: Nicole Ruiz is a 74 y.o. 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  Subjective / 24 H Interval events -Pt did well overnight, she does state she has some mild CP this AM and is feeling weak  -She denies SOB, weakness, numbness or tingling  -Pain is controlled well at this time  Assessment/Plan: Principal Problem:   Fall Active Problems:   Palpitations   Hypertension   Left middle cerebral artery stroke (HCC)   Dyslipidemia   Nodule of right lung   Femur fracture, right (HCC)   Fracture of distal femur, right, closed, with routine healing, subsequent encounter  Fall resulting in acute displaced fracture of distal right femur  - noted in the chart that the patient declined HHPT on discharge  - loop recorder interrogated on admission, no significant events noted per admitting MD  - orthopedic surgery consulted, surgical repair performed 99/83 without complications  - avoid hypotension  - will likely need SNF rehab at discharge per PT evaluation  Acute Blood Loss Anemia, post op - Hemoglobin 7.8 today down from 12.9 on 10/22  - Patient is symptomatic with weakness and fatigue  - Transfuse one PRBC and recheck tomorrow AM   CVA - MRI on admission with interval evolution of recently identified acute left MCA territory infarcts, with several new scattered cortical and subcortical ischemic infarcts superimposed in the same region of the posterior left frontal lobe - stroke team following, she had complete workup done few days ago, discussed with Dr.  Leonie Man today   Hypertension  - Hold antihypertensives - allow permissive hypertension.   Palpitations  - Monitor in telemetry  - no events so far   Dyslipidemia  - Continue statin   CKD stage III  - Creatinine close to usual baseline  Nodule of right lung  - Seen on CT scan of the cervical spine - will need follow-up as outpatient  Dysphagia - secondary to CVA - now NPO, dysphagia 3 post op per SLP evaluation   Diet: Diet clear liquid Room service appropriate?: Yes; Fluid consistency:: Thin Fluids: none  DVT Prophylaxis: Lovenox  Code Status: Full Code Family Communication: Husband present at bedside  Disposition Plan: TBD, likely will need SNF for rehab Barriers to discharge: Post-op   Consultants:  Neurology   Orthopedic surgery  Procedures:  S/p INTRAMEDULLARY (IM) RETROGRADE FEMORAL NAILING 10/24   Antibiotics None   Studies  Ct Head Wo Contrast  10/19/2015  ADDENDUM REPORT: 10/19/2015 16:10 ADDENDUM: Critical Value/emergent results were called by telephone at the time of interpretation on 10/19/2015 at 4:09 pm to Dr. Nicole Kindred , who verbally acknowledged these results. Electronically Signed   By: Marijo Conception, M.D.   On: 10/19/2015 16:10  10/19/2015  CLINICAL DATA:  Aphasia.  Status post fall. EXAM: CT HEAD WITHOUT CONTRAST TECHNIQUE: Contiguous axial images were obtained from the base of the skull through the vertex without intravenous contrast. COMPARISON:  CT scan of October 15, 2015. FINDINGS: Bony calvarium appears intact. Mild diffuse cortical atrophy is noted. Mild chronic ischemic white matter disease is noted. Old left cerebellar infarction is again noted.  No mass effect or midline shift is noted. Ventricular size is within normal limits. There is no evidence of mass lesion, hemorrhage or acute infarction. IMPRESSION: Mild diffuse cortical atrophy. Mild chronic ischemic white matter disease. Old left cerebellar infarction. No acute  intracranial abnormality seen. Electronically Signed: By: Marijo Conception, M.D. On: 10/19/2015 16:04   Ct Cervical Spine Wo Contrast  10/19/2015  CLINICAL DATA:  Status post fall, neck pain EXAM: CT CERVICAL SPINE WITHOUT CONTRAST TECHNIQUE: Multidetector CT imaging of the cervical spine was performed without intravenous contrast. Multiplanar CT image reconstructions were also generated. COMPARISON:  None. FINDINGS: Mild straightening of the cervical spine, likely positional. No evidence of fracture or dislocation. Vertebral body heights are maintained. Dens appears intact. No prevertebral soft tissue swelling. Mild to moderate multilevel degenerative changes. Visualized thyroid is unremarkable. Visualized lung apices are notable for a 4 mm nodule on the right (series 201/ image 81). IMPRESSION: No evidence of traumatic injury to the cervical spine. Mild to moderate degenerative changes. 4 mm pulmonary nodule in the right lung apex. If this patient is high risk for primary bronchogenic neoplasm, a single follow-up CT chest is suggested in 1 year. If low risk, no dedicated follow-up imaging is required. Electronically Signed   By: Julian Hy M.D.   On: 10/19/2015 17:40   Ct Pelvis Wo Contrast  10/19/2015  CLINICAL DATA:  74 year old female acute bilateral hip pain following fall. Initial encounter. EXAM: CT PELVIS WITHOUT CONTRAST TECHNIQUE: Multidetector CT imaging of the pelvis was performed following the standard protocol without intravenous contrast. COMPARISON:  10/19/2015 radiographs FINDINGS: There is no evidence of acute fracture, subluxation or dislocation. Mild degenerative changes in both hips are noted. Mild to moderate degenerative changes at L5-S1 present. No soft tissue abnormalities are identified. IMPRESSION: No evidence of acute bony abnormality. Electronically Signed   By: Margarette Canada M.D.   On: 10/19/2015 19:31   Mr Brain Wo Contrast  10/20/2015  CLINICAL DATA:  Initial  evaluation for acute fall, possible worsening of expressive aphasia. Recent stroke. EXAM: MRI HEAD WITHOUT CONTRAST TECHNIQUE: Multiplanar, multiecho pulse sequences of the brain and surrounding structures were obtained without intravenous contrast. COMPARISON:  Prior MRI from 10/15/2015. FINDINGS: Patchy diffusion abnormality related to recent left MCA territory infarcts again seen. There has been interval evolution of these infarcts, with some normalization of the ADC signal as compared to prior. However, there appear to be several new superimposed cortical and subcortical infarcts in the same region which have developed since previous study. These are now all best appreciated as the most prevalent hypo intense signal seen on ADC map (Series 400, image 31). No associated hemorrhage or mass effect. Major intravascular flow voids are maintained. Atrophy with mild chronic small vessel ischemic disease again noted. Remote lacunar infarcts within the right centrum semi ovale/corona radiata again noted. Remote cortical infarct within the parasagittal left occipital lobe. Bilateral remote cerebellar infarcts, left greater than right. No mass lesion, midline shift, or mass effect. No hydrocephalus. No extra-axial fluid collection. Craniocervical junction within normal limits. Pituitary gland normal. No acute abnormality about the orbits. Mucosal thickening throughout the paranasal sinuses. Small air-fluid levels within the bilateral maxillary sinuses. Trace opacity within the inferior left mastoid air cells. Inner ear structures grossly normal. Bone marrow signal intensity within normal limits. Scalp soft tissues demonstrate no acute abnormality. IMPRESSION: 1. Interval evolution of recently identified acute left MCA territory infarcts, with several new scattered cortical and subcortical ischemic infarcts superimposed in the same  region of the posterior left frontal lobe. No associated mass effect or hemorrhage. 2.  Chronic infarcts as detailed above, stable. Electronically Signed   By: Jeannine Boga M.D.   On: 10/20/2015 02:59   Dg Knee Complete 4 Views Right  10/19/2015  CLINICAL DATA:  Right knee pain status post fall. Stroke 0 couple weeks ago. EXAM: RIGHT KNEE - COMPLETE 4+ VIEW COMPARISON:  None. FINDINGS: Patient is status post total knee arthroplasty. The hardware appears intact and well positioned. There is an acute displaced fracture of the distal right femur, obliquely oriented with approximately 1 cm fracture displacement. Lateral view shows that there is also impaction at the fracture site. There is an associated joint effusion within the adjacent suprapatellar bursa. IMPRESSION: 1. Acute displaced fracture of the distal right femur, just above the right knee arthroplasty hardware, with approximately 1 cm fracture displacement. There is associated angulation deformity at the fracture site and impaction at the fracture site. 2. Status post total knee arthroplasty. Hardware appears intact and well positioned. Electronically Signed   By: Franki Cabot M.D.   On: 10/19/2015 19:47   Dg C-arm 1-60 Min  10/20/2015  CLINICAL DATA:  Distal right femoral fracture EXAM: RIGHT FEMUR 2 VIEWS; DG C-ARM 61-120 MIN COMPARISON:  10/19/2015 FLUOROSCOPY TIME:  Radiation Exposure Index (as provided by the fluoroscopic device): Not available If the device does not provide the exposure index: Fluoroscopy Time:  96 seconds Number of Acquired Images:  Four FINDINGS: A medullary rod is now noted in place with multiple proximal and distal fixation screws. The fracture fragments are in near anatomic alignment. The right knee prosthesis is within normal limits. IMPRESSION: Status post ORIF of distal right femoral fracture Electronically Signed   By: Inez Catalina M.D.   On: 10/20/2015 16:18   Dg Hip Unilat With Pelvis 2-3 Views Right  10/19/2015  CLINICAL DATA:  Severe pain status post unwitnessed fall EXAM: DG HIP (WITH OR  WITHOUT PELVIS) 2-3V RIGHT COMPARISON:  08/05/2015 FINDINGS: No fracture or dislocation is seen. Mild degenerative changes. Visualized bony pelvis appears intact. IMPRESSION: No fracture or dislocation is seen. Electronically Signed   By: Julian Hy M.D.   On: 10/19/2015 17:27   Dg Femur Port, 1v Right  10/20/2015  CLINICAL DATA:  Status post ORIF of distal femoral fracture EXAM: RIGHT FEMUR PORTABLE 1 VIEW COMPARISON:  10/19/2015 FINDINGS: Medullary rod is noted. Fracture fragments are in near anatomic alignment. Knee prosthesis is again seen. IMPRESSION: Status post ORIF of distal right femoral fracture. Electronically Signed   By: Inez Catalina M.D.   On: 10/20/2015 17:02   Dg Femur, Min 2 Views Right  10/20/2015  CLINICAL DATA:  Distal right femoral fracture EXAM: RIGHT FEMUR 2 VIEWS; DG C-ARM 61-120 MIN COMPARISON:  10/19/2015 FLUOROSCOPY TIME:  Radiation Exposure Index (as provided by the fluoroscopic device): Not available If the device does not provide the exposure index: Fluoroscopy Time:  96 seconds Number of Acquired Images:  Four FINDINGS: A medullary rod is now noted in place with multiple proximal and distal fixation screws. The fracture fragments are in near anatomic alignment. The right knee prosthesis is within normal limits. IMPRESSION: Status post ORIF of distal right femoral fracture Electronically Signed   By: Inez Catalina M.D.   On: 10/20/2015 16:18   Objective  Filed Vitals:   10/21/15 0021 10/21/15 0400 10/21/15 0732 10/21/15 1109  BP: 141/55 124/48 108/50 116/53  Pulse: 109 95 84 78  Temp: 99.7 F (37.6  C) 98.9 F (37.2 C) 98.4 F (36.9 C) 98.4 F (36.9 C)  TempSrc: Oral Oral Oral Oral  Resp: 20 18 27 18   Height:      Weight:      SpO2: 91% 95% 98% 94%    Intake/Output Summary (Last 24 hours) at 10/21/15 1122 Last data filed at 10/21/15 0816  Gross per 24 hour  Intake   2910 ml  Output   1400 ml  Net   1510 ml   Filed Weights   10/19/15 2144   Weight: 71.532 kg (157 lb 11.2 oz)   Exam:  GENERAL: NAD  HEENT: head NCAT, no scleral icterus. Mucous membranes are moist.   LUNGS: Clear to auscultation. No wheezing or crackles  HEART: Regular rate and rhythm without murmur. 2+ pulses, no JVD, no peripheral edema  ABDOMEN: Soft, non-distended, non-tender. Positive bowel sounds.  EXTREMITIES: Without any cyanosis or clubbing. Good muscle tone  NEUROLOGIC: Alert and oriented x3. Some mild word finding difficulty. Exam limited due to RLE pain, otherwise strength and sensation intact bilaterally.   Data Reviewed: Basic Metabolic Panel:  Recent Labs Lab 10/15/15 1541 10/16/15 0515 10/19/15 1551 10/19/15 1553 10/21/15 0350  NA 140 139 139 136 137  K 3.8 3.9 4.3 4.2 3.8  CL 110 107 106 102 108  CO2 22 23  --  20* 18*  GLUCOSE 106* 76 111* 115* 113*  BUN 13 14 12 12 12   CREATININE 1.34* 1.15* 1.10* 1.25* 1.08*  CALCIUM 9.4 8.7*  --  9.2 8.2*   Liver Function Tests:  Recent Labs Lab 10/15/15 1541 10/16/15 0515 10/19/15 1553  AST 23 19 58*  ALT 16 13* 36  ALKPHOS 51 44 53  BILITOT 0.7 0.6 0.9  PROT 6.2* 5.6* 6.1*  ALBUMIN 3.8 3.4* 3.6   CBC:  Recent Labs Lab 10/15/15 1541 10/16/15 0515 10/19/15 1551 10/19/15 1553 10/21/15 0350  WBC 4.2 5.1  --  4.5 5.9  NEUTROABS 2.5  --   --  2.9  --   HGB 12.5 11.9* 13.3 12.9 7.8*  HCT 37.4 34.8* 39.0 38.5 23.5*  MCV 92.3 91.3  --  90.6 91.4  PLT 266 236  --  227 150   CBG:  Recent Labs Lab 10/21/15 0726  GLUCAP 108*    Recent Results (from the past 240 hour(s))  Surgical pcr screen     Status: None   Collection Time: 10/20/15 11:05 AM  Result Value Ref Range Status   MRSA, PCR NEGATIVE NEGATIVE Final   Staphylococcus aureus NEGATIVE NEGATIVE Final    Comment:        The Xpert SA Assay (FDA approved for NASAL specimens in patients over 72 years of age), is one component of a comprehensive surveillance program.  Test performance has been validated by  [facility] for patients greater than or equal to 1 year old. It is not intended to diagnose infection nor to guide or monitor treatment.      Scheduled Meds: . sodium chloride   Intravenous Once  . atorvastatin  40 mg Oral q1800  . enoxaparin (LOVENOX) injection  40 mg Subcutaneous Q24H  . nortriptyline  25 mg Oral QHS  . polyethylene glycol  17 g Oral BID   Continuous Infusions: . sodium chloride Stopped (10/20/15 1430)    Elita Boone, PA-S  Marzetta Board, MD [facility] Pager [phone]. If 7 PM - 7 AM, please contact night-coverage at [email or web address], password [facility] 10/21/2015, 11:22 AM  LOS: 2 days

## 2015-10-21 NOTE — Progress Notes (Signed)
SLP Cancellation Note  Patient Details Name: Nicole Ruiz MRN: [id number] DOB: 04-Jan-1941   Cancelled treatment:       Reason Eval/Treat Not Completed: Other (comment). Pts cognitive linguistic function evaluated in prior admission. SLP visited pt and she continues to demonstrate mild to moderate anomia in conversation, though she says she is stressed and does not need therapy. Pt will need f/u SLP, likely SNF level given new physical deficits. Will defer full eval to SNF level therapist.   Herbie Baltimore, [place] CCC-SLP [phone]  Lynann Beaver 10/21/2015, 2:35 PM

## 2015-10-21 NOTE — Evaluation (Signed)
Physical Therapy Evaluation Patient Details Name: Nicole Ruiz MRN: [id number] DOB: 12-Dec-1941 Today's Date: 10/21/2015   History of Present Illness  Patient is a 74 y/o female d/ced on 10/20 with L MCA infarct and aphasia and readmiited 10/22 s/p fall at home due to distal femur fx now s/p IM nail right distal femur. PMH includes HTN, HLD, CKD, depression.  Clinical Impression  Patient presents with lethargy, premorbid expressive aphasia and right sided weakness from recent CVA and limited mobility s/p above surgery and new TDWB status. Pt home only 1 day since d/c with CVA with fall resulting in right distal femur fx. Tolerated squat pivot transfer to chair with mod A. Recommend ST SNF to maximize independence and mobility prior to return home. Spouse agreeable. Will follow acutely.    Follow Up Recommendations SNF;Supervision/Assistance - 24 hour    Equipment Recommendations  Other (comment) (defer to SNF.)    Recommendations for Other Services OT consult     Precautions / Restrictions Precautions Precautions: Fall Precaution Comments: R sided weakness Required Braces or Orthoses: Knee Immobilizer - Right Knee Immobilizer - Right: On at all times (until otherwise stated.) Restrictions Weight Bearing Restrictions: Yes RLE Weight Bearing: Touchdown weight bearing Other Position/Activity Restrictions: TDWB; no order for knee AROM vs when to wear KI      Mobility  Bed Mobility Overal bed mobility: Needs Assistance Bed Mobility: Supine to Sit     Supine to sit: Mod assist     General bed mobility comments: Mod A to bring RLE to EOB and assist with scooting bottom to EOB. Increased time/effort. Lethargic.  Transfers Overall transfer level: Needs assistance Equipment used: None Transfers: Squat Pivot Transfers     Squat pivot transfers: Mod assist     General transfer comment: Mod A squat pivot transfer to left side with cues for technique and safety. Pt with  difficulty not placing weight through RLE. Attempting to stand independently.  Ambulation/Gait                Stairs            Wheelchair Mobility    Modified Rankin (Stroke Patients Only)       Balance Overall balance assessment: Needs assistance Sitting-balance support: Bilateral upper extremity supported;Feet supported Sitting balance-Leahy Scale: Fair Sitting balance - Comments: Requires at least 1 UE support for balance. Very lethargy with anterior lean at times.    Standing balance support: During functional activity Standing balance-Leahy Scale: Zero Standing balance comment: Pt attempting to stand without assist placing weight through RLE despite cues for WB status.                              Pertinent Vitals/Pain Pain Assessment: Faces Faces Pain Scale: Hurts even more Pain Location: RLe with mobility Pain Descriptors / Indicators: Sore;Guarding;Grimacing Pain Intervention(s): Monitored during session;Repositioned;Premedicated before session;Limited activity within patient's tolerance    Home Living Family/patient expects to be discharged to:: Skilled nursing facility Living Arrangements: Spouse/significant other Available Help at Discharge: Family;Available 24 hours/day Type of Home: House Home Access: Stairs to enter   [employer] of Steps: 3-5 Home Layout: Two level Home Equipment: Walker - 2 wheels;Bedside commode;Shower seat;Cane - single point      Prior Function Level of Independence: Independent with assistive device(s)         Comments: Used cane all the time. Reports fall at home after discharge and does not know  how it happened.     Hand Dominance   Dominant Hand: Right    Extremity/Trunk Assessment   Upper Extremity Assessment: Defer to OT evaluation RUE Deficits / Details: Noted decreased strength in grip, biceps, shoulder         Lower Extremity Assessment: RLE deficits/detail RLE Deficits /  Details: Not formally assessed secondary to pain and KI. Able to perform ankle AROM- limited.    Cervical / Trunk Assessment: Normal  Communication   Communication: Expressive difficulties  Cognition Arousal/Alertness: Lethargic Behavior During Therapy: WFL for tasks assessed/performed;Impulsive Overall Cognitive Status: Impaired/Different from baseline Area of Impairment: Safety/judgement         Safety/Judgement: Decreased awareness of safety;Decreased awareness of deficits          General Comments General comments (skin integrity, edema, etc.): Spouse present in room during session.    Exercises General Exercises - Lower Extremity Ankle Circles/Pumps: Both;15 reps;Supine      Assessment/Plan    PT Assessment Patient needs continued PT services  PT Diagnosis Generalized weakness;Acute pain   PT Problem List Decreased strength;Pain;Decreased range of motion;Decreased cognition;Decreased activity tolerance;Decreased balance;Decreased mobility;Decreased knowledge of precautions;Decreased safety awareness  PT Treatment Interventions Balance training;Therapeutic activities;Therapeutic exercise;Wheelchair mobility training;Patient/family education;Functional mobility training;DME instruction;Gait training   PT Goals (Current goals can be found in the Care Plan section) Acute Rehab PT Goals Patient Stated Goal: to go home PT Goal Formulation: With patient Time For Goal Achievement: 11/04/15 Potential to Achieve Goals: Fair    Frequency Min 3X/week   Barriers to discharge Decreased caregiver support;Inaccessible home environment Pt has stairs to climb to get into home and spouse can not provide level of assist needed at d/c.    Co-evaluation               End of Session Equipment Utilized During Treatment: Gait belt Activity Tolerance: Patient limited by lethargy Patient left: in chair;with call bell/phone within reach;with chair alarm set Nurse Communication:  Mobility status (RN notified of transfer technique back to bed; on white board as well.)         Time: 8638-1771 PT Time Calculation (min) (ACUTE ONLY): 32 min   Charges:   PT Evaluation $Initial PT Evaluation Tier I: 1 Procedure PT Treatments $Therapeutic Activity: 8-22 mins   PT G Codes:        Nicole Ruiz 10/21/2015, 12:17 PM  Wray Kearns, [place], DPT [phone]

## 2015-10-22 ENCOUNTER — Non-Acute Institutional Stay (SKILLED_NURSING_FACILITY): Payer: Medicare HMO | Admitting: Internal Medicine

## 2015-10-22 ENCOUNTER — Encounter: Payer: Self-pay | Admitting: Internal Medicine

## 2015-10-22 DIAGNOSIS — F329 Major depressive disorder, single episode, unspecified: Secondary | ICD-10-CM | POA: Diagnosis not present

## 2015-10-22 DIAGNOSIS — I639 Cerebral infarction, unspecified: Secondary | ICD-10-CM

## 2015-10-22 DIAGNOSIS — I10 Essential (primary) hypertension: Secondary | ICD-10-CM

## 2015-10-22 DIAGNOSIS — K219 Gastro-esophageal reflux disease without esophagitis: Secondary | ICD-10-CM

## 2015-10-22 DIAGNOSIS — F32A Depression, unspecified: Secondary | ICD-10-CM

## 2015-10-22 LAB — BASIC METABOLIC PANEL
ANION GAP: 8 (ref 5–15)
BUN: 10 mg/dL (ref 6–20)
CALCIUM: 8.1 mg/dL — AB (ref 8.9–10.3)
CO2: 21 mmol/L — AB (ref 22–32)
Chloride: 108 mmol/L (ref 101–111)
Creatinine, Ser: 0.98 mg/dL (ref 0.44–1.00)
GFR calc Af Amer: >60 mL/min (ref >60)
GFR calc non Af Amer: 55 mL/min — ABNORMAL LOW (ref >60)
GLUCOSE: 101 mg/dL — AB (ref 65–99)
Potassium: 3.9 mmol/L (ref 3.5–5.1)
Sodium: 137 mmol/L (ref 135–145)

## 2015-10-22 LAB — TYPE AND SCREEN
ABO/RH(D): A POS
ANTIBODY SCREEN: NEGATIVE
UNIT DIVISION: 0

## 2015-10-22 LAB — CBC
HEMATOCRIT: 27 % — AB (ref 36.0–46.0)
Hemoglobin: 9.3 g/dL — ABNORMAL LOW (ref 12.0–15.0)
MCH: 30.5 pg (ref 26.0–34.0)
MCHC: 34.4 g/dL (ref 30.0–36.0)
MCV: 88.5 fL (ref 78.0–100.0)
Platelets: 126 10*3/uL — ABNORMAL LOW (ref 150–400)
RBC: 3.05 MIL/uL — ABNORMAL LOW (ref 3.87–5.11)
RDW: 13.6 % (ref 11.5–15.5)
WBC: 5.8 10*3/uL (ref 4.0–10.5)

## 2015-10-22 MED ORDER — ASPIRIN 325 MG PO TBEC: 325.0000 mg | DELAYED_RELEASE_TABLET | Freq: Every day | ORAL | Status: DC

## 2015-10-22 MED ORDER — METHOCARBAMOL 500 MG PO TABS
500.0000 mg | ORAL_TABLET | Freq: Three times a day (TID) | ORAL | Status: DC | PRN
Start: 2015-10-22 — End: 2018-03-10

## 2015-10-22 MED ORDER — ASPIRIN EC 325 MG PO TBEC
325.0000 mg | DELAYED_RELEASE_TABLET | Freq: Every day | ORAL | Status: DC
  Administered 2015-10-22: 325 mg via ORAL
  Filled 2015-10-22: qty 1

## 2015-10-22 MED ORDER — NABUMETONE 500 MG PO TABS: 1000.0000 mg | ORAL_TABLET | Freq: Two times a day (BID) | ORAL | Status: DC | PRN

## 2015-10-22 NOTE — Progress Notes (Deleted)
Patient ID: Nicole Ruiz, female   DOB: 06-13-1941, 74 y.o.   MRN: [id number]

## 2015-10-22 NOTE — Discharge Instructions (Signed)
Discharge Instructions after Surgery   You will have a light dressing on your knee.  Leave the dressing in place until the third day after your surgery and then remove it and place a band-aid over the stitches.  Okay to shower after 5 days. Pump your foot up and down 20 times per hour, every hour you are awake.  Apply ice to the knee 3 times per day for 30 minutes for the first 1 week until your knee is feeling comfortable again. Do not use heat.  Pain medicine has been prescribed for you.  Use your medicine as needed over the first 48 hours, and then you can begin to taper your use. You may take Extra Strength Tylenol or Tylenol only in place of the pain pills.  Take the blood thinner medication daily for 3 weeks after surgery.   Please call [phone] during normal business hours or [phone] after hours for any problems. Including the following:  - excessive redness of the incisions - drainage for more than 4 days - fever of more than 101.5 F  *Please note that pain medications will not be refilled after hours or on weekends.  Follow with BOUSKA,DAVID E, MD in 5-7 days  Please get a complete blood count and chemistry panel checked by your Primary MD at your next visit, and again as instructed by your Primary MD. Please get your medications reviewed and adjusted by your Primary MD.  Please request your Primary MD to go over all Hospital Tests and Procedure/Radiological results at the follow up, please get all Hospital records sent to your Prim MD by signing hospital release before you go home.  If you had Pneumonia of Lung problems at the Hospital: Please get a 2 view Chest X ray done in 6-8 weeks after hospital discharge or sooner if instructed by your Primary MD.  If you have Congestive Heart Failure: Please call your Cardiologist or Primary MD anytime you have any of the following symptoms:  1) 3 pound weight gain in 24 hours or 5 pounds in 1 week  2) shortness of breath,  with or without a dry hacking cough  3) swelling in the hands, feet or stomach  4) if you have to sleep on extra pillows at night in order to breathe  Follow cardiac low salt diet and 1.5 lit/day fluid restriction.  If you have diabetes Accuchecks 4 times/day, Once in AM empty stomach and then before each meal. Log in all results and show them to your primary doctor at your next visit. If any glucose reading is under 80 or above 300 call your primary MD immediately.  If you have Seizure/Convulsions/Epilepsy: Please do not drive, operate heavy machinery, participate in activities at heights or participate in high speed sports until you have seen by Primary MD or a Neurologist and advised to do so again.  If you had Gastrointestinal Bleeding: Please ask your Primary MD to check a complete blood count within one week of discharge or at your next visit. Your endoscopic/colonoscopic biopsies that are pending at the time of discharge, will also need to followed by your Primary MD.  Get Medicines reviewed and adjusted. Please take all your medications with you for your next visit with your Primary MD  Please request your Primary MD to go over all hospital tests and procedure/radiological results at the follow up, please ask your Primary MD to get all Hospital records sent to his/her office.  If you experience worsening of your admission  symptoms, develop shortness of breath, life threatening emergency, suicidal or homicidal thoughts you must seek medical attention immediately by calling 911 or calling your MD immediately  if symptoms less severe.  You must read complete instructions/literature along with all the possible adverse reactions/side effects for all the Medicines you take and that have been prescribed to you. Take any new Medicines after you have completely understood and accpet all the possible adverse reactions/side effects.   Do not drive or operate heavy machinery when taking Pain  medications.   Do not take more than prescribed Pain, Sleep and Anxiety Medications  Special Instructions: If you have smoked or chewed Tobacco  in the last 2 yrs please stop smoking, stop any regular Alcohol  and or any Recreational drug use.  Wear Seat belts while driving.  Please note You were cared for by a hospitalist during your hospital stay. If you have any questions about your discharge medications or the care you received while you were in the hospital after you are discharged, you can call the unit and asked to speak with the hospitalist on call if the hospitalist that took care of you is not available. Once you are discharged, your primary care physician will handle any further medical issues. Please note that NO REFILLS for any discharge medications will be authorized once you are discharged, as it is imperative that you return to your primary care physician (or establish a relationship with a primary care physician if you do not have one) for your aftercare needs so that they can reassess your need for medications and monitor your lab values.  You can reach the hospitalist office at phone [phone] or fax [phone]   If you do not have a primary care physician, you can call [phone] for a physician referral.  Activity: As tolerated with Full fall precautions use walker/cane & assistance as needed  Diet: heart healthy

## 2015-10-22 NOTE — Progress Notes (Deleted)
Patient ID: Nicole Ruiz, female   DOB: 1941-01-01, 74 y.o.   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 She was just discharged from the facility on 10/20 after a full workup including a TEE with a PFO but negative lower extremity Doppler. She had loop recorder implantation. She had tells me she slipped and fell in the bathroom in her own home and suffered a distal right femur fracture and an extension of her CVA. She underwent intramedullary retrograde femoral nailing on 10/24. She seems to have tolerated the surgery fairly well.

## 2015-10-22 NOTE — Clinical Social Work Placement (Addendum)
   CLINICAL SOCIAL WORK PLACEMENT  NOTE  Date:  10/22/2015  Patient Details  Name: Nicole Ruiz MRN: [id number] Date of Birth: [birth year]  Clinical Social Work is seeking post-discharge placement for this patient at the [place] level of care (*CSW will initial, date and re-position this form in  chart as items are completed):  Yes   Patient/family provided with [facility] Work Department's list of facilities offering this level of care within the geographic area requested by the patient (or if unable, by the patient's family).  Yes   Patient/family informed of their freedom to choose among providers that offer the needed level of care, that participate in Medicare, Medicaid or managed care program needed by the patient, have an available bed and are willing to accept the patient.  Yes   Patient/family informed of [facility]'s ownership interest in [facility] and [facility], as well as of the fact that they are under no obligation to receive care at these facilities.  PASRR submitted to EDS on 10/22/15     PASRR number received on 10/22/15     Existing PASRR number confirmed on       FL2 transmitted to all facilities in geographic area requested by pt/family on 10/22/15     FL2 transmitted to all facilities within larger geographic area on       Patient informed that his/her managed care company has contracts with or will negotiate with certain facilities, including the following:        Yes   Patient/family informed of bed offers received.  Patient chooses bed at [facility]     Physician recommends and patient chooses bed at      Patient to be transferred to [place] on 10/22/15.  Patient to be transferred to facility by Ambulance     Patient family notified on 10/22/15 of transfer.  Name of family member notified:  [place]     PHYSICIAN       Additional Comment:  Per MD patient ready for DC to [facility] with LOG pending [place]. RN, patient, patient's family (husband at bedside), and facility notified of DC. RN given number for report. DC packet on chart. Ambulance transport requested for patient. CSW signing off.   _______________________________________________ Liz Beach MSW, [place], [place], [phone]

## 2015-10-22 NOTE — Progress Notes (Signed)
This encounter was created in error - please disregard.

## 2015-10-22 NOTE — Progress Notes (Signed)
   PATIENT ID: Nicole Ruiz   2 Days Post-Op Procedure(s) (LRB): INTRAMEDULLARY (IM) RETROGRADE FEMORAL NAILING (Right)  Subjective: Has some right knee pain today at rest, worse with ambulation. No other complaints or concerns.   Objective:  Filed Vitals:   10/22/15 0544  BP:   Pulse:   Temp: 97.6 F (36.4 C)  Resp:       R knee immobilizer removed, dressing c/d/i Mild swelling right knee ROM ankle, distally NVI  Labs:   Recent Labs  10/19/15 1551 10/19/15 1553 10/21/15 0350 10/22/15 0440  HGB 13.3 12.9 7.8* 9.3*   Recent Labs  10/21/15 0350 10/22/15 0440  WBC 5.9 5.8  RBC 2.57* 3.05*  HCT 23.5* 27.0*  PLT 150 126*   Recent Labs  10/21/15 0350 10/22/15 0440  NA 137 137  K 3.8 3.9  CL 108 108  CO2 18* 21*  BUN 12 10  CREATININE 1.08* 0.98  GLUCOSE 113* 101*  CALCIUM 8.2* 8.1*    Assessment and Plan: 2 days s/p right retrograde nail distal femur fracture  Touchdown weight bearing Continue current pain mgmt, percocet script in chart for d/c D/c per primary team, SNF Lovenox daily for DVT prophylaxis due to recent CVA history, SCDs

## 2015-10-22 NOTE — Care Management Important Message (Signed)
Important Message  Patient Details  Name: Nicole Ruiz MRN: [id number] Date of Birth: December 06, 1941   Medicare Important Message Given:  Yes-second notification given    Nathen May 10/22/2015, 1:56 PM

## 2015-10-22 NOTE — Discharge Summary (Signed)
Physician Discharge Summary  Nicole Ruiz XNT:[id number] DOB: [birth year] DOA: 10/19/2015  PCP: Phineas Inches, MD  Admit date: 10/19/2015 Discharge date: 10/22/2015  Time spent: > 30 minutes  Recommendations for Outpatient Follow-up:  1. Follow up with Dr. Coletta Memos in 1-2 weeks 2. Follow up with Dr. Tamera Punt in 2 weeks 3. Follow up with Dr. Leonie Man in 2 months    Discharge Diagnoses:  Principal Problem:   Fall Active Problems:   Palpitations   Hypertension   Left middle cerebral artery stroke (HCC)   Dyslipidemia   Nodule of right lung   Femur fracture, right (HCC)   Fracture of distal femur, right, closed, with routine healing, subsequent encounter  Discharge Condition: stable  Diet recommendation: heart healthy, dysphagia 3  Filed Weights   10/19/15 2144 10/22/15 0544  Weight: 71.532 kg (157 lb 11.2 oz) 76.204 kg (168 lb)    History of present illness:  See H&P, Labs, Consult and Test reports for all details in brief, patient is a 74 y.o. female with a Past Medical History of left MCA territory stroke-presumed to be embolic-was just discharged from this facility on 10/20 after a full workup including TEE with PFO but negative lower extremity Doppler-underwent loop recorder implantation-was doing well since discharge-andsubsequently fell while in her bathroom and was re-admitted with a femur fracture and extension of her CVA.  Hospital Course:  Fall resulting in acute displaced fracture of distal right femur - noted in the chart that the patient declined HHPT on discharge, loop recorder interrogated on admission, no significant events noted. Orthopedic surgery consulted, surgical repair performed 96/75 without complications, she is recovering well post op and will be discharged to SNF.  Acute Blood Loss Anemia, post op - mild, hemoglobin decreased to 7.8 post op, improved to 9.3 after 1 U pRBC. Please recheck a CBC in 3-4 days after discharge CVA - MRI on admission with  interval evolution of recently identified acute left MCA territory infarcts, with several new scattered cortical and subcortical ischemic infarcts superimposed in the same region of the posterior left frontal lobe. Neurology was consulted and have followed patient, recommended Aspirin 325. Hypertension - Hold antihypertensives (propranolol) for 2 additional days following discharge, then can resume unless BP low normal.  Palpitations - Monitored in telemetry, no events, loop interrogation without events as well.  Dyslipidemia - Continue statin  CKD stage III - Creatinine close to usual baseline Nodule of right lung - Seen on CT scan of the cervical spine, will need follow-up as outpatient Dysphagia- secondary to CVA, dysphagia 3 post op per SLP evaluation  Procedures:  S/p INTRAMEDULLARY (IM) RETROGRADE FEMORAL NAILING 10/24   Consultations:  Orthopedic surgery   Discharge Exam: Filed Vitals:   10/21/15 2054 10/22/15 0027 10/22/15 0425 10/22/15 0544  BP:   99/63   Pulse:   92   Temp: 98.6 F (37 C) 99.5 F (37.5 C)  97.6 F (36.4 C)  TempSrc: Oral Oral  Oral  Resp:   17   Height:      Weight:    76.204 kg (168 lb)  SpO2:   97%    General: NAD Cardiovascular: RRR Respiratory: CTA biL  Discharge Instructions Activity:  As tolerated   Get Medicines reviewed and adjusted: Please take all your medications with you for your next visit with your Primary MD  Please request your Primary MD to go over all hospital tests and procedure/radiological results at the follow up, please ask your Primary MD to get  all Hospital records sent to his/her office.  If you experience worsening of your admission symptoms, develop shortness of breath, life threatening emergency, suicidal or homicidal thoughts you must seek medical attention immediately by calling 911 or calling your MD immediately if symptoms less severe.  You must read complete instructions/literature along with all the  possible adverse reactions/side effects for all the Medicines you take and that have been prescribed to you. Take any new Medicines after you have completely understood and accpet all the possible adverse reactions/side effects.   Do not drive when taking Pain medications.   Do not take more than prescribed Pain, Sleep and Anxiety Medications  Special Instructions: If you have smoked or chewed Tobacco in the last 2 yrs please stop smoking, stop any regular Alcohol and or any Recreational drug use.  Wear Seat belts while driving.  Please note  You were cared for by a hospitalist during your hospital stay. Once you are discharged, your primary care physician will handle any further medical issues. Please note that NO REFILLS for any discharge medications will be authorized once you are discharged, as it is imperative that you return to your primary care physician (or establish a relationship with a primary care physician if you do not have one) for your aftercare needs so that they can reassess your need for medications and monitor your lab values.  Discharge Instructions    Touch down weight bearing    Complete by:  As directed             Medication List    TAKE these medications        ARTIFICIAL TEARS 1.4 % ophthalmic solution  Generic drug:  polyvinyl alcohol  Place 1 drop into both eyes every hour as needed for dry eyes.     aspirin 325 MG EC tablet  Take 1 tablet (325 mg total) by mouth daily.     atorvastatin 40 MG tablet  Commonly known as:  LIPITOR  Take 1 tablet (40 mg total) by mouth daily.     citalopram 20 MG tablet  Commonly known as:  CELEXA  Take 20 mg by mouth daily.     enoxaparin 40 MG/0.4ML injection  Commonly known as:  LOVENOX  Inject 0.4 mLs (40 mg total) into the skin daily.     feeding supplement (ENSURE ENLIVE) Liqd  Take 237 mLs by mouth daily as needed (Please offer if meal completion is less than 50%).     KRILL OIL PO  Take 1 capsule by  mouth daily. Mega Red     methocarbamol 500 MG tablet  Commonly known as:  ROBAXIN  Take 1 tablet (500 mg total) by mouth every 8 (eight) hours as needed for muscle spasms.     nabumetone 500 MG tablet  Commonly known as:  RELAFEN  Take 2 tablets (1,000 mg total) by mouth 2 (two) times daily as needed.     nortriptyline 25 MG capsule  Commonly known as:  PAMELOR  Take 25 mg by mouth at bedtime.     oxyCODONE-acetaminophen 5-325 MG tablet  Commonly known as:  ROXICET  Take 1-2 tablets by mouth every 4 (four) hours as needed for severe pain.     PRILOSEC OTC 20 MG tablet  Generic drug:  omeprazole  Take 20 mg by mouth daily.     propranolol 10 MG tablet  Commonly known as:  INDERAL  Take 10 mg by mouth 2 (two) times daily.  pyridOXINE 100 MG tablet  Commonly known as:  VITAMIN B-6  Take 100 mg by mouth daily.     Vitamin D3 2000 UNITS Tabs  Take 2,000 Units by mouth daily.       Follow-up Information    Follow up with BOUSKA,DAVID E, MD. Schedule an appointment as soon as possible for a visit in 2 weeks.   Specialty:  Family Medicine   Contact information:   [address] [place] [address] [phone]       Follow up with Nita Sells, MD. Schedule an appointment as soon as possible for a visit in 2 weeks.   Specialty:  Orthopedic Surgery   Contact information:   [address] [place] [address] [phone]       Follow up with SETHI,PRAMOD, MD. Schedule an appointment as soon as possible for a visit in 2 months.   Specialties:  Neurology, Radiology   Contact information:   [address] [place] [address] [phone]       The results of significant diagnostics from this hospitalization (including imaging, microbiology, ancillary and laboratory) are listed below for reference.    Significant Diagnostic Studies: Dg Chest 2 View  10/15/2015  CLINICAL DATA:  Acute  onset of left-sided weakness. CVA. Initial encounter. EXAM: CHEST  2 VIEW COMPARISON:  Chest radiograph performed 05/28/2012 FINDINGS: The lungs are well-aerated. There is elevation of the right hemidiaphragm. There is no evidence of focal opacification, pleural effusion or pneumothorax. The heart is normal in size; the mediastinal contour is within normal limits. No acute osseous abnormalities are seen. IMPRESSION: Elevation of the right hemidiaphragm.  Lungs remain grossly clear. Electronically Signed   By: Garald Balding M.D.   On: 10/15/2015 23:09   Ct Head Wo Contrast  10/19/2015  ADDENDUM REPORT: 10/19/2015 16:10 ADDENDUM: Critical Value/emergent results were called by telephone at the time of interpretation on 10/19/2015 at 4:09 pm to Dr. Nicole Kindred , who verbally acknowledged these results. Electronically Signed   By: Marijo Conception, M.D.   On: 10/19/2015 16:10  10/19/2015  CLINICAL DATA:  Aphasia.  Status post fall. EXAM: CT HEAD WITHOUT CONTRAST TECHNIQUE: Contiguous axial images were obtained from the base of the skull through the vertex without intravenous contrast. COMPARISON:  CT scan of October 15, 2015. FINDINGS: Bony calvarium appears intact. Mild diffuse cortical atrophy is noted. Mild chronic ischemic white matter disease is noted. Old left cerebellar infarction is again noted. No mass effect or midline shift is noted. Ventricular size is within normal limits. There is no evidence of mass lesion, hemorrhage or acute infarction. IMPRESSION: Mild diffuse cortical atrophy. Mild chronic ischemic white matter disease. Old left cerebellar infarction. No acute intracranial abnormality seen. Electronically Signed: By: Marijo Conception, M.D. On: 10/19/2015 16:04   Ct Head Wo Contrast  10/15/2015  CLINICAL DATA:  Aphasia, RIGHT-sided weakness, onset of headache on Monday morning, hypertension, hyperlipidemia EXAM: CT HEAD WITHOUT CONTRAST TECHNIQUE: Contiguous axial images were obtained from the base  of the skull through the vertex without intravenous contrast. COMPARISON:  None FINDINGS: Generalized atrophy. Normal ventricular morphology. No midline shift or mass effect. Minimal small vessel chronic ischemic changes of deep cerebral white matter. Old infarct LEFT cerebellar hemisphere. No intracranial hemorrhage, mass lesion or evidence acute infarction. No extra-axial fluid collections. Mucosal thickening throughout the ethmoid air cells, maxillary sinuses and sphenoid sinus. Small air-fluid level LEFT maxillary sinus. Mastoid air cells  and middle ear cavities clear bilaterally. Osseous structures unremarkable. IMPRESSION: Small old LEFT cerebellar infarct. Atrophy with minimal small vessel chronic ischemic changes of deep cerebral white matter. No acute intracranial abnormalities. Scattered sinus disease changes as above with small air-fluid level LEFT maxillary sinus. Electronically Signed   By: Lavonia Dana M.D.   On: 10/15/2015 16:02   Ct Cervical Spine Wo Contrast  10/19/2015  CLINICAL DATA:  Status post fall, neck pain EXAM: CT CERVICAL SPINE WITHOUT CONTRAST TECHNIQUE: Multidetector CT imaging of the cervical spine was performed without intravenous contrast. Multiplanar CT image reconstructions were also generated. COMPARISON:  None. FINDINGS: Mild straightening of the cervical spine, likely positional. No evidence of fracture or dislocation. Vertebral body heights are maintained. Dens appears intact. No prevertebral soft tissue swelling. Mild to moderate multilevel degenerative changes. Visualized thyroid is unremarkable. Visualized lung apices are notable for a 4 mm nodule on the right (series 201/ image 81). IMPRESSION: No evidence of traumatic injury to the cervical spine. Mild to moderate degenerative changes. 4 mm pulmonary nodule in the right lung apex. If this patient is high risk for primary bronchogenic neoplasm, a single follow-up CT chest is suggested in 1 year. If low risk, no dedicated  follow-up imaging is required. Electronically Signed   By: Julian Hy M.D.   On: 10/19/2015 17:40   Ct Pelvis Wo Contrast  10/19/2015  CLINICAL DATA:  74 year old female acute bilateral hip pain following fall. Initial encounter. EXAM: CT PELVIS WITHOUT CONTRAST TECHNIQUE: Multidetector CT imaging of the pelvis was performed following the standard protocol without intravenous contrast. COMPARISON:  10/19/2015 radiographs FINDINGS: There is no evidence of acute fracture, subluxation or dislocation. Mild degenerative changes in both hips are noted. Mild to moderate degenerative changes at L5-S1 present. No soft tissue abnormalities are identified. IMPRESSION: No evidence of acute bony abnormality. Electronically Signed   By: Margarette Canada M.D.   On: 10/19/2015 19:31   Mr Angiogram Head Wo Contrast  10/15/2015  CLINICAL DATA:  Aphasia and right-sided weakness beginning yesterday. Headache. EXAM: MRI HEAD WITHOUT CONTRAST MRA HEAD WITHOUT CONTRAST TECHNIQUE: Multiplanar, multiecho pulse sequences of the brain and surrounding structures were obtained without intravenous contrast. Angiographic images of the head were obtained using MRA technique without contrast. COMPARISON:  Head CT 10/15/2015 and MRI 09/22/2010 FINDINGS: MRI HEAD FINDINGS There are multiple foci of acute cortical and subcortical infarction in the left MCA territory. The largest confluent focus measures approximately 3 cm and involves the posterior insula and parietal lobe/ posterior corona radiata. Separate smaller foci of acute infarction are present elsewhere in the left parietal and posterior left frontal lobes. There is no evidence of intracranial hemorrhage, mass, midline shift, or extra-axial fluid collection. Chronic infarcts are again seen in the left cerebellum and right corona radiata/ centrum semiovale. There is also a small, chronic medial left occipital lobe cortical infarct which is new from the prior MRI. There is mild global  cerebral atrophy. Orbits are unremarkable. There is moderate mucosal thickening in the ethmoid air cells bilaterally. Mild bilateral maxillary sinus mucosal thickening and a small amount of left maxillary sinus fluid are noted. Mastoid air cells are clear. Major intracranial vascular flow voids are preserved. MRA HEAD FINDINGS Mildly to moderately motion degraded examination despite repeating the acquisition. The visualized distal vertebral arteries are patent without stenosis and with the left being mildly dominant. Basilar artery is patent without significant stenosis. ICAs are patent with mild-to-moderate irregular narrowing bilaterally. PCAs are patent with moderate branch vessel  irregularity but no significant proximal stenosis. There is a small right posterior communicating artery with an infundibulum noted at its origin. Internal carotid arteries are patent from skullbase to carotid termini without evidence of significant stenosis. M1 and A1 segments are patent without evidence of significant stenosis. There is prominent irregular attenuation of the left MCA M2 branches, greater posteriorly in the region of the acute infarct and with likely occlusion of multiple small branch vessels. There is mild-to-moderate right MCA branch vessel attenuation as well. IMPRESSION: 1. Acute left MCA territory infarct. 2. Chronic infarcts as above. 3. No large vessel intracranial occlusion. Marked attenuation of left MCA M2 branch vessels/ subtotal occlusion of left M2 trunks with occlusion of more distal branch vessels. Electronically Signed   By: Logan Bores M.D.   On: 10/15/2015 18:58   Mr Brain Wo Contrast  10/20/2015  CLINICAL DATA:  Initial evaluation for acute fall, possible worsening of expressive aphasia. Recent stroke. EXAM: MRI HEAD WITHOUT CONTRAST TECHNIQUE: Multiplanar, multiecho pulse sequences of the brain and surrounding structures were obtained without intravenous contrast. COMPARISON:  Prior MRI from  10/15/2015. FINDINGS: Patchy diffusion abnormality related to recent left MCA territory infarcts again seen. There has been interval evolution of these infarcts, with some normalization of the ADC signal as compared to prior. However, there appear to be several new superimposed cortical and subcortical infarcts in the same region which have developed since previous study. These are now all best appreciated as the most prevalent hypo intense signal seen on ADC map (Series 400, image 31). No associated hemorrhage or mass effect. Major intravascular flow voids are maintained. Atrophy with mild chronic small vessel ischemic disease again noted. Remote lacunar infarcts within the right centrum semi ovale/corona radiata again noted. Remote cortical infarct within the parasagittal left occipital lobe. Bilateral remote cerebellar infarcts, left greater than right. No mass lesion, midline shift, or mass effect. No hydrocephalus. No extra-axial fluid collection. Craniocervical junction within normal limits. Pituitary gland normal. No acute abnormality about the orbits. Mucosal thickening throughout the paranasal sinuses. Small air-fluid levels within the bilateral maxillary sinuses. Trace opacity within the inferior left mastoid air cells. Inner ear structures grossly normal. Bone marrow signal intensity within normal limits. Scalp soft tissues demonstrate no acute abnormality. IMPRESSION: 1. Interval evolution of recently identified acute left MCA territory infarcts, with several new scattered cortical and subcortical ischemic infarcts superimposed in the same region of the posterior left frontal lobe. No associated mass effect or hemorrhage. 2. Chronic infarcts as detailed above, stable. Electronically Signed   By: Jeannine Boga M.D.   On: 10/20/2015 02:59   Mr Brain Wo Contrast  10/15/2015  CLINICAL DATA:  Aphasia and right-sided weakness beginning yesterday. Headache. EXAM: MRI HEAD WITHOUT CONTRAST MRA HEAD  WITHOUT CONTRAST TECHNIQUE: Multiplanar, multiecho pulse sequences of the brain and surrounding structures were obtained without intravenous contrast. Angiographic images of the head were obtained using MRA technique without contrast. COMPARISON:  Head CT 10/15/2015 and MRI 09/22/2010 FINDINGS: MRI HEAD FINDINGS There are multiple foci of acute cortical and subcortical infarction in the left MCA territory. The largest confluent focus measures approximately 3 cm and involves the posterior insula and parietal lobe/ posterior corona radiata. Separate smaller foci of acute infarction are present elsewhere in the left parietal and posterior left frontal lobes. There is no evidence of intracranial hemorrhage, mass, midline shift, or extra-axial fluid collection. Chronic infarcts are again seen in the left cerebellum and right corona radiata/ centrum semiovale. There is also a  small, chronic medial left occipital lobe cortical infarct which is new from the prior MRI. There is mild global cerebral atrophy. Orbits are unremarkable. There is moderate mucosal thickening in the ethmoid air cells bilaterally. Mild bilateral maxillary sinus mucosal thickening and a small amount of left maxillary sinus fluid are noted. Mastoid air cells are clear. Major intracranial vascular flow voids are preserved. MRA HEAD FINDINGS Mildly to moderately motion degraded examination despite repeating the acquisition. The visualized distal vertebral arteries are patent without stenosis and with the left being mildly dominant. Basilar artery is patent without significant stenosis. ICAs are patent with mild-to-moderate irregular narrowing bilaterally. PCAs are patent with moderate branch vessel irregularity but no significant proximal stenosis. There is a small right posterior communicating artery with an infundibulum noted at its origin. Internal carotid arteries are patent from skullbase to carotid termini without evidence of significant stenosis.  M1 and A1 segments are patent without evidence of significant stenosis. There is prominent irregular attenuation of the left MCA M2 branches, greater posteriorly in the region of the acute infarct and with likely occlusion of multiple small branch vessels. There is mild-to-moderate right MCA branch vessel attenuation as well. IMPRESSION: 1. Acute left MCA territory infarct. 2. Chronic infarcts as above. 3. No large vessel intracranial occlusion. Marked attenuation of left MCA M2 branch vessels/ subtotal occlusion of left M2 trunks with occlusion of more distal branch vessels. Electronically Signed   By: Logan Bores M.D.   On: 10/15/2015 18:58   Dg Knee Complete 4 Views Right  10/19/2015  CLINICAL DATA:  Right knee pain status post fall. Stroke 0 couple weeks ago. EXAM: RIGHT KNEE - COMPLETE 4+ VIEW COMPARISON:  None. FINDINGS: Patient is status post total knee arthroplasty. The hardware appears intact and well positioned. There is an acute displaced fracture of the distal right femur, obliquely oriented with approximately 1 cm fracture displacement. Lateral view shows that there is also impaction at the fracture site. There is an associated joint effusion within the adjacent suprapatellar bursa. IMPRESSION: 1. Acute displaced fracture of the distal right femur, just above the right knee arthroplasty hardware, with approximately 1 cm fracture displacement. There is associated angulation deformity at the fracture site and impaction at the fracture site. 2. Status post total knee arthroplasty. Hardware appears intact and well positioned. Electronically Signed   By: Franki Cabot M.D.   On: 10/19/2015 19:47   Dg C-arm 1-60 Min  10/20/2015  CLINICAL DATA:  Distal right femoral fracture EXAM: RIGHT FEMUR 2 VIEWS; DG C-ARM 61-120 MIN COMPARISON:  10/19/2015 FLUOROSCOPY TIME:  Radiation Exposure Index (as provided by the fluoroscopic device): Not available If the device does not provide the exposure index:  Fluoroscopy Time:  96 seconds Number of Acquired Images:  Four FINDINGS: A medullary rod is now noted in place with multiple proximal and distal fixation screws. The fracture fragments are in near anatomic alignment. The right knee prosthesis is within normal limits. IMPRESSION: Status post ORIF of distal right femoral fracture Electronically Signed   By: Inez Catalina M.D.   On: 10/20/2015 16:18   Dg Hip Unilat With Pelvis 2-3 Views Right  10/19/2015  CLINICAL DATA:  Severe pain status post unwitnessed fall EXAM: DG HIP (WITH OR WITHOUT PELVIS) 2-3V RIGHT COMPARISON:  08/05/2015 FINDINGS: No fracture or dislocation is seen. Mild degenerative changes. Visualized bony pelvis appears intact. IMPRESSION: No fracture or dislocation is seen. Electronically Signed   By: Julian Hy M.D.   On: 10/19/2015 17:27  Dg Femur Port, 1v Right  10/20/2015  CLINICAL DATA:  Status post ORIF of distal femoral fracture EXAM: RIGHT FEMUR PORTABLE 1 VIEW COMPARISON:  10/19/2015 FINDINGS: Medullary rod is noted. Fracture fragments are in near anatomic alignment. Knee prosthesis is again seen. IMPRESSION: Status post ORIF of distal right femoral fracture. Electronically Signed   By: Inez Catalina M.D.   On: 10/20/2015 17:02   Dg Femur, Min 2 Views Right  10/20/2015  CLINICAL DATA:  Distal right femoral fracture EXAM: RIGHT FEMUR 2 VIEWS; DG C-ARM 61-120 MIN COMPARISON:  10/19/2015 FLUOROSCOPY TIME:  Radiation Exposure Index (as provided by the fluoroscopic device): Not available If the device does not provide the exposure index: Fluoroscopy Time:  96 seconds Number of Acquired Images:  Four FINDINGS: A medullary rod is now noted in place with multiple proximal and distal fixation screws. The fracture fragments are in near anatomic alignment. The right knee prosthesis is within normal limits. IMPRESSION: Status post ORIF of distal right femoral fracture Electronically Signed   By: Inez Catalina M.D.   On: 10/20/2015 16:18     Microbiology: Recent Results (from the past 240 hour(s))  Surgical pcr screen     Status: None   Collection Time: 10/20/15 11:05 AM  Result Value Ref Range Status   MRSA, PCR NEGATIVE NEGATIVE Final   Staphylococcus aureus NEGATIVE NEGATIVE Final    Comment:        The Xpert SA Assay (FDA approved for NASAL specimens in patients over 54 years of age), is one component of a comprehensive surveillance program.  Test performance has been validated by [facility] for patients greater than or equal to 33 year old. It is not intended to diagnose infection nor to guide or monitor treatment.      Labs: Basic Metabolic Panel:  Recent Labs Lab 10/15/15 1541 10/16/15 0515 10/19/15 1551 10/19/15 1553 10/21/15 0350 10/22/15 0440  NA 140 139 139 136 137 137  K 3.8 3.9 4.3 4.2 3.8 3.9  CL 110 107 106 102 108 108  CO2 22 23  --  20* 18* 21*  GLUCOSE 106* 76 111* 115* 113* 101*  BUN 13 14 12 12 12 10   CREATININE 1.34* 1.15* 1.10* 1.25* 1.08* 0.98  CALCIUM 9.4 8.7*  --  9.2 8.2* 8.1*   Liver Function Tests:  Recent Labs Lab 10/15/15 1541 10/16/15 0515 10/19/15 1553  AST 23 19 58*  ALT 16 13* 36  ALKPHOS 51 44 53  BILITOT 0.7 0.6 0.9  PROT 6.2* 5.6* 6.1*  ALBUMIN 3.8 3.4* 3.6   CBC:  Recent Labs Lab 10/15/15 1541 10/16/15 0515 10/19/15 1551 10/19/15 1553 10/21/15 0350 10/22/15 0440  WBC 4.2 5.1  --  4.5 5.9 5.8  NEUTROABS 2.5  --   --  2.9  --   --   HGB 12.5 11.9* 13.3 12.9 7.8* 9.3*  HCT 37.4 34.8* 39.0 38.5 23.5* 27.0*  MCV 92.3 91.3  --  90.6 91.4 88.5  PLT 266 236  --  227 150 126*   CBG:  Recent Labs Lab 10/21/15 0726  GLUCAP 108*     Signed:  GHERGHE, COSTIN  [facility] 10/22/2015, 10:23 AM

## 2015-10-24 ENCOUNTER — Other Ambulatory Visit: Payer: Self-pay

## 2015-10-24 MED ORDER — OXYCODONE-ACETAMINOPHEN 5-325 MG PO TABS: 2.0000 | ORAL_TABLET | ORAL | Status: DC | PRN

## 2015-10-24 NOTE — Telephone Encounter (Signed)
Rx faxed to [facility] @ [phone], phone number [phone]  

## 2015-10-25 ENCOUNTER — Ambulatory Visit: Payer: Medicare PPO

## 2015-10-29 ENCOUNTER — Non-Acute Institutional Stay (SKILLED_NURSING_FACILITY): Payer: Medicare HMO | Admitting: Internal Medicine

## 2015-10-29 DIAGNOSIS — I639 Cerebral infarction, unspecified: Secondary | ICD-10-CM

## 2015-10-29 DIAGNOSIS — F32A Depression, unspecified: Secondary | ICD-10-CM

## 2015-10-29 DIAGNOSIS — F329 Major depressive disorder, single episode, unspecified: Secondary | ICD-10-CM

## 2015-10-29 DIAGNOSIS — K219 Gastro-esophageal reflux disease without esophagitis: Secondary | ICD-10-CM | POA: Diagnosis not present

## 2015-10-29 NOTE — Progress Notes (Addendum)
Patient ID: Nicole Ruiz, female   DOB: January 14, 1941, 74 y.o.   MRN: [id number]                HISTORY & PHYSICAL  DATE:  10/22/2015           FACILITY: [facility]                 LEVEL OF CARE:   SNF   CHIEF COMPLAINT:  Admission to the facility, post stay at [facility], 10/19/2015 through 10/22/2015.       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  I think she did fairly well from a rehab point of view and went home partially ambulatory.  She had a fall in the bathroom, suffered a distal right femur fracture and underwent an ORIF.     During the initial admission, she had a TEE that showed a PFO but negative lower extremity dopplers.   She had a loop recorder, but no events.    Her repeat MRI done on this admission showed extension of the left middle cerebral artery infarct with several new scattered cortical and subcortical ischemic strokes in the same area of the posterior left frontal lobe.  Neurology saw the patient and recommended aspirin.    PAST MEDICAL HISTORY/PROBLEM LIST:   Reviewed.             CURRENT MEDICATIONS:  Medication list is reviewed.               SOCIAL HISTORY:                   HOUSING:  The patient lives in her own home with her husband.   FUNCTIONAL STATUS:  She tells me she had a cane and a walker at home.  Her exact functional status is unclear.     REVIEW OF SYSTEMS:       HEENT:  No headache.  No diplopia.  No visual disturbance.   NECK/THROAT:  No swallowing difficulties.  No throat pain.   CHEST/RESPIRATORY:  No shortness of breath.      CARDIAC:  She apparently complained of palpitations in the hospital, not currently.       GI:  No abdominal pain.  No diarrhea.     GU:  No dysuria.   No hematuria.    NEUROLOGICAL:  Extremities:  Left leg and right leg weakness.    PSYCHIATRIC:  Mental status:  Denies depression.      PHYSICAL  EXAMINATION:   VITAL SIGNS:     PULSE:  80.    RESPIRATIONS:  16.    02 SATURATIONS:  95% on room air.    GENERAL APPEARANCE:  The patient is not in any distress.     HEENT:   MOUTH/THROAT:  Oral exam is normal.    EYES:  Visual fields intact by direct confrontation.   CHEST/RESPIRATORY:  Clear air entry bilaterally.     CARDIOVASCULAR:   CARDIAC:  Heart sounds are normal.  There are no murmurs.  No gallops.    GASTROINTESTINAL:   ABDOMEN:  No masses.    LIVER/SPLEEN/KIDNEY:   No liver, no spleen.  No tenderness.    GENITOURINARY:   BLADDER:  Not distended.  There is no CVA tenderness.     MUSCULOSKELETAL:    EXTREMITIES:   RIGHT LOWER EXTREMITY:  She has  an immobilizer on.   This was gently removed.  Her incisions are clean and well opposed.   CIRCULATION:   EDEMA/VARICOSITIES:  There is no evidence of a DVT bilaterally.   ARTERIAL:  Peripheral pulses are intact.    NEUROLOGICAL:   CRANIAL NERVES:  Her cranial nerves seem normal.   SENSATION/STRENGTH:  Even in the normal left leg, she has significant proximal weakness.    MOTOR:  However, she has no pronator drift.     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  She is easily understood.    PSYCHIATRIC:   MENTAL STATUS:    She is cognitively intact.  No clear evidence of depression.    ASSESSMENT/PLAN:               Extension of a left hemisphere stroke, as described.  She had extensive work-up in the hospital.  She is now on aspirin and Lovenox.  I do not think Nabumetone is safe in this setting and I will discontinue this.   Use other analgesics.     Palpitations.  Loop recorder did not show any events.    Hypertension.  We will need to monitor this while she is here.  Apparently, her blood pressure was somewhat low in the hospital.  However, her Inderal is going to be restarted.    Depression.  She is on both Celexa and nortriptyline.  I wonder what the presumed benefit of 25 mg of  nortriptyline is.  I am not sure if this was being used as a urinary antispasmodic. I think this can probably be discontinues.   Low vitamin D level.  On replacement.     Hyperlipidemia.  On Lipitor.  I am not exactly sure if this was started for primary or secondary prevention.    I will need to watch this lady's vitals carefully.    I have stopped her NSAID.    Blood work will be repeated next week.     CPT CODE: 34742

## 2015-11-04 NOTE — Progress Notes (Addendum)
Patient ID: Nicole Ruiz, female   DOB: 30-Nov-1941, 74 y.o.   MRN: [id number]               PROGRESS NOTE  DATE:  10/29/2015           FACILITY: [facility]                      LEVEL OF CARE:   SNF   Acute Visit                CHIEF COMPLAINT:  Re-review of multiple issues brought up since her arrival in the facility.      HISTORY OF PRESENT ILLNESS:  This is a 74 year-old woman who suffered a left middle cerebral artery stroke, presumably embolic.  She did well in rehab and went home partially ambulatory.  She had a fall in her bathroom and suffered a distal right femur fracture.  The original admit was from 10/15/2015 through 10/17/2015.  She was readmitted, having fallen at home, two days later and was in hospital from 10/19/2015 through 10/22/2015.   She suffered a right distal femur fracture and had surgery.    There was supposed to be follow-up lab work this week, a CBC and basic metabolic panel.    I discontinued her nabumetone as she is also on aspirin 81 and Lovenox.  She seems to have tolerated this quite well.     As well, we have pharmacy concerns including interaction with nortriptyline and Celexa.  This is secondary to QT prolongation.  Also, the toxic effects of citalopram may be increased by use of omeprazole.  I have reviewed this with the patient.   She has a longstanding history of depression, but denies this currently.  She thinks she is on the nortriptyline for depression.  I am uncertain where the Celexa was added.  It is unlikely that the nortriptyline at 25 mg would give any effective antidepressant effect.    Past Medical History  Diagnosis Date  . Hypertension   . Hyperlipidemia   . 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 Depression   . Stroke ([place])   . Chronic kidney disease    Past Surgical History  Procedure Laterality Date  . Knee surgery    . Abdominal hysterectomy    . Arm surgery    . Hemorroidectomy    . Ep implantable device N/A  10/17/2015    Procedure: Loop Recorder Insertion;  Surgeon: Will Meredith Leeds, MD;  Location: [facility];  Service: Cardiovascular;  Laterality: N/A;  . Tee without cardioversion N/A 10/17/2015    Procedure: TRANSESOPHAGEAL ECHOCARDIOGRAM (TEE);  Surgeon: Jerline Pain, MD;  Location: [facility];  Service: Cardiovascular;  Laterality: N/A;  . Cardiac catheterization  2013    negative  . Eye surgery    . Femur im nail Right 10/20/2015    Procedure: INTRAMEDULLARY (IM) RETROGRADE FEMORAL NAILING;  Surgeon: Tania Ade, MD;  Location: [facility];  Service: Orthopedics;  Laterality: Right;    LABORATORY DATA:  CBC:  White count 5.2, hemoglobin 8.7, platelet count 405.    Basic metabolic panel:  Essentially normal.    She left the hospital with a hemoglobin of 9.3.  This will need to be monitored.    REVIEW OF SYSTEMS:    GENERAL: At bit disappointed in her situation but otherwise well HEENT:   No headache.  No diplopia.  No visual disturbances.    NECK:  No swallowing problems.   CHEST/RESPIRATORY:  No shortness of breath.       CARDIAC:  No chest pain or palpitations.     GI:  No abdominal pain.   No diarrhea.    GU:  No dysuria.   No hematuria.    NEUROLOGICAL:  She states she is still weak.     PSYCHIATRIC:  Mental status:  Denies any depression currently.     PHYSICAL EXAMINATION:   VITAL SIGNS:     PULSE:  70.     RESPIRATIONS:  18.    BLOOD PRESSURE:  140/72.    02 SATURATIONS:  96%.   CHEST/RESPIRATORY:  Clear air entry bilaterally.    CARDIOVASCULAR:   CARDIAC:  Heart sounds are normal.   GASTROINTESTINAL:   LIVER/SPLEEN/KIDNEYS:  No liver, no spleen.  No tenderness.    GENITOURINARY:   BLADDER:  Not enlarged.  There is no CVA tenderness.    MUSCULOSKELETAL:   EXTREMITIES:   RIGHT LOWER EXTREMITY:  She has an immobilizer on the right leg.   No evidence of a DVT bilaterally.   NEUROLOGICAL:    CRANIAL NERVES:  Her cranial nerves seem normal.   MOTOR:  No  pronator drift in the upper extremities.    SENSATION/STRENGTH:  Again, she has weakness in the unaffected left leg.     SPEECH:   She has some word-finding deficits.     PSYCHIATRIC:   MENTAL STATUS:  No real evidence of depression.      ASSESSMENT/PLAN:                 Extension of a left hemisphere stroke.   She is on aspirin and Lovenox.     Pain issues.  I stopped her nabumetone last week and she seems to have tolerated this well.    Depression.  I think the nortriptyline can probably stop.  I see no benefit of this except perhaps as a sleep aid.    Gastroesophageal reflux.  She still complains of this.  I am going to continue her medications.    fall, possible worsening of expressive aphasia. Recent stroke.   EXAM: MRI HEAD WITHOUT CONTRAST   TECHNIQUE: Multiplanar, multiecho pulse sequences of the brain and surrounding structures were obtained without intravenous contrast.   COMPARISON:  Prior MRI from 10/15/2015.   FINDINGS: Patchy diffusion abnormality related to recent left MCA territory infarcts again seen. There has been interval evolution of these infarcts, with some normalization of the ADC signal as compared to prior. However, there appear to be several new superimposed cortical and subcortical infarcts in the same region which have developed since previous study. These are now all best appreciated as the most prevalent hypo intense signal seen on ADC map (Series 400, image 31). No associated hemorrhage or mass effect. Major intravascular flow voids are maintained.   Atrophy with mild chronic small vessel ischemic disease again noted. Remote lacunar infarcts within the right centrum semi ovale/corona radiata again noted. Remote cortical infarct within the parasagittal left occipital lobe. Bilateral remote cerebellar infarcts, left greater than right.   No mass lesion, midline shift, or mass effect. No hydrocephalus. No extra-axial fluid collection.     Craniocervical junction within normal limits. Pituitary gland normal. No acute abnormality about the orbits.   Mucosal thickening throughout the paranasal sinuses. Small air-fluid levels within the bilateral maxillary sinuses. Trace opacity within the inferior left mastoid air cells. Inner ear structures grossly normal.   Bone marrow signal  intensity within normal limits. Scalp soft tissues demonstrate no acute abnormality.   IMPRESSION: 1. Interval evolution of recently identified acute left MCA territory infarcts, with several new scattered cortical and subcortical ischemic infarcts superimposed in the same region of the posterior left frontal lobe. No associated mass effect or hemorrhage. 2. Chronic infarcts as detailed above, stable.     Electronically Signed   By: Jeannine Boga M.D.   On: 10/20/2015 02:59

## 2015-11-12 ENCOUNTER — Non-Acute Institutional Stay (SKILLED_NURSING_FACILITY): Payer: Medicare HMO | Admitting: Internal Medicine

## 2015-11-12 DIAGNOSIS — I10 Essential (primary) hypertension: Secondary | ICD-10-CM | POA: Diagnosis not present

## 2015-11-12 DIAGNOSIS — I639 Cerebral infarction, unspecified: Secondary | ICD-10-CM | POA: Diagnosis not present

## 2015-11-17 NOTE — Progress Notes (Signed)
Patient ID: Nicole Ruiz, female   DOB: 12/22/1941, 74 y.o.   MRN: [id number]                PROGRESS NOTE  DATE:  11/12/2015        FACILITY: [facility]                LEVEL OF CARE:   SNF   Acute Visit                  CHIEF COMPLAINT:  Constipation, medication refusals.     HISTORY OF PRESENT ILLNESS:  I have a note on this patient today that she has been refusing her medications.    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  She went home and then fell.  She suffered a femur fracture.  A repeat MRI on the admission before she came here showed extension of the left middle cerebral artery stroke in the posterior left frontal lobe.  Neurology saw the patient and recommended aspirin.  Her femur fracture was repaired with an intramedullary retrograde femoral nailing on 10/20/2015.  She came here on Lovenox for DVT prophylaxis, although I think this was actually stopped although I am not sure of what the issue was.  I think, given this lady's immobilized right leg, she certainly needs this restarted.  She is at high risk for a DVT.  In this situation, I do not think that aspirin 325 is sufficient.    She was transfused in the hospital when her hemoglobin dropped to 7.8.  She was discharged with a hemoglobin of 9.3.  I have repeated her hemoglobin at 11.3 on 11/11/2015.  This is satisfactory.    Past Medical History  Diagnosis Date  . Hypertension   . Hyperlipidemia   . 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 Depression   . Stroke ([place])   . Chronic kidney disease     REVIEW OF SYSTEMS:   Difficult in this patient because of cortical language difficulty.  However:     HEENT:   No headache.   No visual disturbance.   CHEST/RESPIRATORY:  No shortness of breath.   CARDIAC:  No chest pain.   GI:  States she is constipated.  This is the issue.  However, I am not exactly sure why she did not take her medications.   GU:  She does  not complain of dysuria.     PHYSICAL EXAMINATION:   VITAL SIGNS:     TEMPERATURE:  98.3.    PULSE:  76.    RESPIRATIONS:  19.   BLOOD PRESSURE:  142/64.   02 SATURATIONS:  95% on room air.   CHEST/RESPIRATORY:  Clear air entry bilaterally.    CARDIOVASCULAR:   CARDIAC:  Heart sounds are normal.  There are no murmurs.   No gallops.  No bruits.   GASTROINTESTINAL:   ABDOMEN:  Not distended.     LIVER/SPLEEN/KIDNEYS:  No liver, no spleen.  No tenderness.    GENITOURINARY:   BLADDER:  No suprapubic or costovertebral angle tenderness.    MUSCULOSKELETAL:   EXTREMITIES:   RIGHT LOWER EXTREMITY:   Her right leg remains in an immobilizer.  Followed by Orthopedics.   NEUROLOGICAL:   I see very little difference in her neurologic exam from the last time I went over this.  She has word-finding difficulties, some aphasia.  However, she is easily understood.  Nothing seems  lateralizing here.    ASSESSMENT/PLAN:             Medication refusals.  I have emphasized the importance of taking her medications.    DVT prophylaxis.  I do not think the intent here was to stop this woman's Lovenox.  Unfortunately, I do not think she has been on this for 12 days.  I am going to restart this and emphasize the reason for it.  I had stopped her NSAID earlier in the admission as I did not want her on a combination of aspirin, NSAID, and Lovenox.   She seems to have tolerated this satisfactorily.    Hypertension.  Her blood pressures are mostly in the 140-150 range.  I do not see that she is on anything for high blood pressure.  Given her status, this may be something we need to look at.  I will try to monitor this next week.    Anemia.  This is a non-issue.     CPT

## 2015-12-03 ENCOUNTER — Encounter (INDEPENDENT_AMBULATORY_CARE_PROVIDER_SITE_OTHER): Payer: Self-pay

## 2015-12-03 ENCOUNTER — Ambulatory Visit (INDEPENDENT_AMBULATORY_CARE_PROVIDER_SITE_OTHER): Payer: Medicare HMO | Admitting: Neurology

## 2015-12-03 ENCOUNTER — Encounter: Payer: Self-pay | Admitting: Neurology

## 2015-12-03 VITALS — BP 114/65 | HR 65 | Ht 64.0 in | Wt 143.8 lb

## 2015-12-03 DIAGNOSIS — R569 Unspecified convulsions: Secondary | ICD-10-CM | POA: Diagnosis not present

## 2015-12-03 NOTE — Patient Instructions (Signed)
I had a long d/w patient and husband about her recent strokes, risk for recurrent stroke/TIAs, personally independently reviewed imaging studies and stroke evaluation results and answered questions.Continue aspirin 325 mg daily  for secondary stroke prevention and maintain strict control of hypertension with blood pressure goal below 130/90, diabetes with hemoglobin A1c goal below 6.5% and lipids with LDL cholesterol goal below 70 mg/dL. I also advised the patient to eat a healthy diet with plenty of whole grains, cereals, fruits and vegetables, exercise regularly and maintain ideal body weight .she was advised to follow-up with her orthopedic surgeon for weight bearing instructions due to her recent right hip and knee surgery. She was advised to look into getting home speech therapy and physical occupational therapy. Plan to check EEG to look for Compex partial seizures given near passing out symptoms prior to both her recent strokes. Followup in the future with me in 6 months or call earlier if necessary. Stroke Prevention Some medical conditions and behaviors are associated with an increased chance of having a stroke. You may prevent a stroke by making healthy choices and managing medical conditions. HOW CAN I REDUCE MY RISK OF HAVING A STROKE?   Stay physically active. Get at least 30 minutes of activity on most or all days.  Do not smoke. It may also be helpful to avoid exposure to secondhand smoke.  Limit alcohol use. Moderate alcohol use is considered to be:  No more than 2 drinks per day for men.  No more than 1 drink per day for nonpregnant women.  Eat healthy foods. This involves:  Eating 5 or more servings of fruits and vegetables a day.  Making dietary changes that address high blood pressure (hypertension), high cholesterol, diabetes, or obesity.  Manage your cholesterol levels.  Making food choices that are high in fiber and low in saturated fat, trans fat, and cholesterol may  control cholesterol levels.  Take any prescribed medicines to control cholesterol as directed by your health care provider.  Manage your diabetes.  Controlling your carbohydrate and sugar intake is recommended to manage diabetes.  Take any prescribed medicines to control diabetes as directed by your health care provider.  Control your hypertension.  Making food choices that are low in salt (sodium), saturated fat, trans fat, and cholesterol is recommended to manage hypertension.  Ask your health care provider if you need treatment to lower your blood pressure. Take any prescribed medicines to control hypertension as directed by your health care provider.  If you are 83-21 years of age, have your blood pressure checked every 3-5 years. If you are 35 years of age or older, have your blood pressure checked every year.  Maintain a healthy weight.  Reducing calorie intake and making food choices that are low in sodium, saturated fat, trans fat, and cholesterol are recommended to manage weight.  Stop drug abuse.  Avoid taking birth control pills.  Talk to your health care provider about the risks of taking birth control pills if you are over 68 years old, smoke, get migraines, or have ever had a blood clot.  Get evaluated for sleep disorders (sleep apnea).  Talk to your health care provider about getting a sleep evaluation if you snore a lot or have excessive sleepiness.  Take medicines only as directed by your health care provider.  For some people, aspirin or blood thinners (anticoagulants) are helpful in reducing the risk of forming abnormal blood clots that can lead to stroke. If you have the irregular  heart rhythm of atrial fibrillation, you should be on a blood thinner unless there is a good reason you cannot take them.  Understand all your medicine instructions.  Make sure that other conditions (such as anemia or atherosclerosis) are addressed. SEEK IMMEDIATE MEDICAL CARE IF:    You have sudden weakness or numbness of the face, arm, or leg, especially on one side of the body.  Your face or eyelid droops to one side.  You have sudden confusion.  You have trouble speaking (aphasia) or understanding.  You have sudden trouble seeing in one or both eyes.  You have sudden trouble walking.  You have dizziness.  You have a loss of balance or coordination.  You have a sudden, severe headache with no known cause.  You have new chest pain or an irregular heartbeat. Any of these symptoms may represent a serious problem that is an emergency. Do not wait to see if the symptoms will go away. Get medical help at once. Call your local emergency services (911 in [place] Do not drive yourself to the hospital.   This information is not intended to replace advice given to you by your health care provider. Make sure you discuss any questions you have with your health care provider.   Document Released: 01/21/2005 Document Revised: 01/04/2015 Document Reviewed: 06/16/2013 Elsevier Interactive Patient Education [employer].

## 2015-12-03 NOTE — Progress Notes (Signed)
[facility] [address] [name]. [place] [address] [phone]       OFFICE FOLLOW-UP NOTE  Nicole. WYNDE FAILS Date of Birth:  June 17, 1941 Medical Record Number:  [id number]   HPI: Nicole Ruiz is a 6 year Caucasian lady is seen today for follow-up after hospital admission for strokes 2 in October 2016.Nicole Ruiz is an 74 y.o. female with a past medical history significant for HTN, hyperlipidemia, depression, presented to the ED for evaluation of dysarthria, right sided weakness on 10/13/15 initially. She reported that she went to bed Sunday night feeling well but woke around around 1 am Monday and realized that the right arm and leg were not moving well, weak, and she couldn't go anywhere without holding to things around her room. Then, she became aware of having trouble expressing herself. Overall. Her symptoms have remained unchanged. Complains of HA, but denies vertigo, double vision, difficulty swallowing, focal weakness, or vision disturbances. CT brain showed no acute abnormality. MRI revealed a moderate size infarct along the insular region, posterior frontal region, as well as smaller areas of acute infarct posterior left parietal area and corona radiata. MRA brain: no large vessel intracranial occlusion. Marked attenuation of left MCA M2 branch vessels/ subtotal occlusion of left M2 trunks with occlusion of more distal branch vessels. Patient was last known well 10/13/15 at 9:30 pm. Patient was not administered TPA secondary to delay in arrival. She was admitted for further evaluation and treatment. Carotid Doppler showed no significant extracranial stenosis. MRA of the brain showed marked attenuation of the left MCA M2 branches with the subtotal occlusion of the left M2 trunk and distal branches but no large vessel proximal occlusion. Transthoracic echo showed normal ejection fraction. TEE showed no definite source of embolism but showed a patent foramen ovale but  lower extremity venous Dopplers were negative for DVT and patient had a loop recorder inserted. LDL cholesterol was slightly elevated at 85 mg percent and hemoglobin A1c was 5.9. Patient was started on aspirin and initially on Zocor and did well and was discharged home but came back a week later with a fall at home and was noted as having fractured femur on the right. Repeat MRI showed extension of the left MCA frontal infarcts which may have controverted to her weakness and fall. Interestingly loop recorder interrogation did not reveal atrial fibrillation even though the patient described what looked like a near syncopal episode leading to a fall. She stated that she had a similar episode of dizziness and nearly passing out prior to her initial stroke admission as well. She had an EEG done in the hospital which did not show any definite seizure activity and was normal. She was seen by physical occupational therapy as well as by orthopedics and underwent surgery for her femoral fracture. She still has significant pain in the right knee and hip and is only partial weightbearing. She has not had any home physical occupational speech therapy. She still complains of mild weakness in the right side including diminished fine motor skills in her hand and she often drops objects. She also has speech and word finding difficulties and his speech is slow and hesitant. She has been home now for the last few weeks but home therapy has not yet been started.   ROS:   14 system review of systems is positive for weight loss, leg swelling, hearing loss, spinning sensation, trouble swallowing, itching, blurred and double vision, loss of vision, eye pain, shortness of breath,  cough, snoring, feeling hot and cold, increased thirst, joint pain and swelling, cramps, aching muscles, runny nose, memory loss, confusion, headache, numbness, weakness, slurred speech, difficult swallowing, dizziness, passing out, depression, not enough  sleep, decreased energy, change in appetite, disinterest in activity, hallucinations, insomnia, sleepiness, snoring, restless legs  PMH:  Past Medical History  Diagnosis Date  . Hypertension   . Hyperlipidemia   . 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 Depression   . Stroke ([place])   . Chronic kidney disease     Social History:  Social History   Social History  . Marital Status: Married    Spouse Name: N/A  . Number of Children: N/A  . Years of Education: N/A   Occupational History  . Not on file.   Social History Main Topics  . Smoking status: Never Smoker   . Smokeless tobacco: Never Used  . Alcohol Use: No  . Drug Use: No  . Sexual Activity: No   Other Topics Concern  . Not on file   Social History Narrative   Married.    Medications:   Current Outpatient Prescriptions on File Prior to Visit  Medication Sig Dispense Refill  . aspirin EC 325 MG EC tablet Take 1 tablet (325 mg total) by mouth daily. 30 tablet 0  . atorvastatin (LIPITOR) 40 MG tablet Take 1 tablet (40 mg total) by mouth daily. (Patient taking differently: Take 40 mg by mouth at bedtime. ) 90 tablet 0  . Cholecalciferol (VITAMIN D3) 2000 UNITS TABS Take 2,000 Units by mouth daily.     . citalopram (CELEXA) 20 MG tablet Take 20 mg by mouth daily.    . feeding supplement, ENSURE ENLIVE, (ENSURE ENLIVE) LIQD Take 237 mLs by mouth daily as needed (Please offer if meal completion is less than 50%). 30 Bottle 0  . KRILL OIL PO Take 1 capsule by mouth daily. Mega Red    . methocarbamol (ROBAXIN) 500 MG tablet Take 1 tablet (500 mg total) by mouth every 8 (eight) hours as needed for muscle spasms. 30 tablet 0  . nabumetone (RELAFEN) 500 MG tablet Take 2 tablets (1,000 mg total) by mouth 2 (two) times daily as needed.    . nortriptyline (PAMELOR) 25 MG capsule Take 25 mg by mouth at bedtime.    [name] omeprazole (PRILOSEC OTC) 20 MG tablet Take 20 mg by mouth daily.    [name] oxyCODONE-acetaminophen (ROXICET)  5-325 MG tablet Take 2 tablets by mouth every 4 (four) hours as needed for severe pain. DO NOT EXCEED 4GM OF APAP IN 24 HOURS FROM ALL SOURCES 360 tablet 0  . polyvinyl alcohol (ARTIFICIAL TEARS) 1.4 % ophthalmic solution Place 1 drop into both eyes every hour as needed for dry eyes.    [name] pyridOXINE (VITAMIN B-6) 100 MG tablet Take 100 mg by mouth daily.     No current facility-administered medications on file prior to visit.    Allergies:  No Known Allergies  Physical Exam General: Frail elderly Caucasian lady, seated, in no evident distress Head: head normocephalic and atraumatic.  Neck: supple with no carotid or supraclavicular bruits Cardiovascular: regular rate and rhythm, no murmurs Musculoskeletal: no deformity Skin:  no rash/petichiae Vascular:  Normal pulses all extremities Filed Vitals:   12/03/15 1533  BP: 114/65  Pulse: 65   Neurologic Exam Mental Status: Awake and fully alert. Oriented to place and time. Recent and remote memory intact. Attention span, concentration and fund of knowledge appropriate. Mood and affect  appropriate.  Cranial Nerves: Fundoscopic exam reveals sharp disc margins. Pupils equal, briskly reactive to light. Extraocular movements full without nystagmus. Visual fields full to confrontation. Hearing intact. Facial sensation intact. Face, tongue, palate moves normally and symmetrically.  Motor: Normal bulk and tone. Normal strength in all tested extremity muscles except in the right lower extremity where pain in the right hip and knee limits her movements and strength testing. Sensory.: intact to touch ,pinprick .position and vibratory sensation.  Coordination: Rapid alternating movements normal in all extremities. Finger-to-nose and heel-to-shin performed accurately bilaterally. Gait and Station: Arises from chair with  difficulty. Stance is normal. Gait demonstrates  favoring of the right knee and hip due to pain and uses a walker Not able to heel, toe  and tandem walk without difficulty.  Reflexes: 1+ and symmetric. Toes downgoing.   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 She presented twice 1 week apart with recurrent strokes in a similar distribution. She has PFO but lower extremity venous Dopplers were negative. Loop recorder was negative for atrial fibrillation. Vascular risk factors of hypertension, hyperlipidemia, PFO and prior strokes    PLAN: I had a long d/w patient and husband about her recent strokes, risk for recurrent stroke/TIAs, personally independently reviewed imaging studies and stroke evaluation results and answered questions.Continue aspirin 325 mg daily  for secondary stroke prevention and maintain strict control of hypertension with blood pressure goal below 130/90, diabetes with hemoglobin A1c goal below 6.5% and lipids with LDL cholesterol goal below 70 mg/dL. I also advised the patient to eat a healthy diet with plenty of whole grains, cereals, fruits and vegetables, exercise regularly and maintain ideal body weight .she was advised to follow-up with her orthopedic surgeon for weight bearing instructions due to her recent right hip and knee surgery. She was advised to look into getting home speech therapy and physical occupational therapy. Plan to check EEG to look for Compex partial seizures given near passing out symptoms prior to both her recent strokes.Greater than 50% of time during this 25 minute visit was spent on counseling,explanation of diagnosis, planning of further management, discussion with patient and family and coordination of care  Followup in the future with me in 6 months or call earlier if necessary.  Antony Contras, MD Note: This document was prepared with digital dictation and possible smart phrase technology. Any transcriptional errors that result from this process  are unintentional

## 2015-12-20 DIAGNOSIS — I6990 Unspecified sequelae of unspecified cerebrovascular disease: Secondary | ICD-10-CM | POA: Insufficient documentation

## 2015-12-20 DIAGNOSIS — Z9181 History of falling: Secondary | ICD-10-CM | POA: Insufficient documentation

## 2015-12-24 ENCOUNTER — Ambulatory Visit (INDEPENDENT_AMBULATORY_CARE_PROVIDER_SITE_OTHER): Payer: Medicare HMO | Admitting: Diagnostic Neuroimaging

## 2015-12-24 DIAGNOSIS — R569 Unspecified convulsions: Secondary | ICD-10-CM

## 2015-12-25 NOTE — Procedures (Signed)
   [facility]  EEG (ELECTROENCEPHALOGRAM) REPORT   STUDY DATE: 12/24/15 PATIENT NAME: Nicole Ruiz DOB: 14-May-1941 MRN: [id number]  ORDERING CLINICIAN: Antony Contras, MD   TECHNOLOGIST: Laretta Alstrom  TECHNIQUE: Electroencephalogram was recorded utilizing standard 10-20 system of lead placement and reformatted into average and bipolar montages.  RECORDING TIME: 25 minutes  ACTIVATION: photic stimulation  CLINICAL INFORMATION: 74 year old female with left MCA infarcts and abnormal spells.  FINDINGS: Background rhythms of 10-11 hertz and 50-60 microvolts. No focal, lateralizing, epileptiform activity or seizures are seen. Patient recorded in the awake and drowsy state. EKG channel shows irregular rhythm of 90-104 beats per minute.  IMPRESSION:  Normal EEG in the awake state. Otherwise noted is irregular rhythm and tachycardia on EKG channel. If clinically indicated, please evaluate/follow up for cardiac arrhythmia such as atria fibrillation.   INTERPRETING PHYSICIAN:  Penni Bombard, MD Certified in Neurology, Neurophysiology and Neuroimaging  [facility] [address], [place], [place] [address] [phone]

## 2015-12-31 ENCOUNTER — Telehealth: Payer: Self-pay

## 2015-12-31 NOTE — Telephone Encounter (Signed)
Rn call patients husband [name] on her [employer]. Rn notified patients wife that his wife's EEG was normal. Pts husband verbalized understanding.

## 2015-12-31 NOTE — Telephone Encounter (Signed)
-----   Message from Garvin Fila, MD sent at 12/31/2015  8:36 AM EST ----- Kindly inform the patient that EEG study was normal

## 2016-01-01 ENCOUNTER — Ambulatory Visit (INDEPENDENT_AMBULATORY_CARE_PROVIDER_SITE_OTHER): Payer: Medicare HMO | Admitting: *Deleted

## 2016-01-01 DIAGNOSIS — I638 Other cerebral infarction: Secondary | ICD-10-CM | POA: Diagnosis not present

## 2016-01-01 DIAGNOSIS — I6389 Other cerebral infarction: Secondary | ICD-10-CM

## 2016-01-08 NOTE — Progress Notes (Signed)
[facility] Summary Report / Loop Recorder 

## 2016-01-09 ENCOUNTER — Other Ambulatory Visit: Payer: Self-pay | Admitting: *Deleted

## 2016-01-09 ENCOUNTER — Encounter: Payer: Self-pay | Admitting: *Deleted

## 2016-01-20 ENCOUNTER — Encounter: Payer: Medicare HMO | Admitting: Cardiology

## 2016-01-31 ENCOUNTER — Ambulatory Visit (INDEPENDENT_AMBULATORY_CARE_PROVIDER_SITE_OTHER): Payer: Medicare HMO | Admitting: *Deleted

## 2016-01-31 DIAGNOSIS — I6389 Other cerebral infarction: Secondary | ICD-10-CM

## 2016-01-31 DIAGNOSIS — I638 Other cerebral infarction: Secondary | ICD-10-CM | POA: Diagnosis not present

## 2016-02-03 NOTE — Progress Notes (Signed)
[facility] Summary Report / Loop Recorder 

## 2016-02-13 LAB — CUP PACEART REMOTE DEVICE CHECK: Date Time Interrogation Session: 20170104124036

## 2016-02-13 NOTE — Progress Notes (Signed)
Carelink summary report received. Battery status OK. Normal device function. No new symptom episodes, tachy episodes, brady episodes. No new AF episodes. 1 nocturnal pause episode recorded.  Monthly summary reports and ROV/PRN

## 2016-03-02 ENCOUNTER — Ambulatory Visit (INDEPENDENT_AMBULATORY_CARE_PROVIDER_SITE_OTHER): Payer: Medicare HMO | Admitting: *Deleted

## 2016-03-02 DIAGNOSIS — I638 Other cerebral infarction: Secondary | ICD-10-CM | POA: Diagnosis not present

## 2016-03-02 DIAGNOSIS — I6389 Other cerebral infarction: Secondary | ICD-10-CM

## 2016-03-06 NOTE — Progress Notes (Signed)
[facility] Summary Report / Loop Recorder 

## 2016-03-07 LAB — CUP PACEART REMOTE DEVICE CHECK: MDC IDC SESS DTM: 20170305220818

## 2016-03-07 NOTE — Progress Notes (Signed)
Carelink summary report received. Battery status OK. Normal device function. No new symptom episodes, tachy episodes, brady, or pause episodes. No new AF episodes. Monthly summary reports and ROV/PRN 

## 2016-03-29 LAB — CUP PACEART REMOTE DEVICE CHECK: Date Time Interrogation Session: 20170203220551

## 2016-03-29 NOTE — Progress Notes (Signed)
Carelink summary report received. Battery status OK. Normal device function. No new tachy episodes, brady, or pause episodes. No new AF episodes. 1 symptom episode- appears SR throughout. Monthly summary reports and ROV/PRN

## 2016-03-31 ENCOUNTER — Ambulatory Visit (INDEPENDENT_AMBULATORY_CARE_PROVIDER_SITE_OTHER): Payer: Medicare HMO | Admitting: *Deleted

## 2016-03-31 DIAGNOSIS — I638 Other cerebral infarction: Secondary | ICD-10-CM | POA: Diagnosis not present

## 2016-03-31 DIAGNOSIS — I6389 Other cerebral infarction: Secondary | ICD-10-CM

## 2016-04-02 NOTE — Progress Notes (Signed)
[facility] Summary Report / Loop Recorder 

## 2016-04-30 ENCOUNTER — Ambulatory Visit (INDEPENDENT_AMBULATORY_CARE_PROVIDER_SITE_OTHER): Payer: Medicare HMO | Admitting: *Deleted

## 2016-04-30 DIAGNOSIS — I638 Other cerebral infarction: Secondary | ICD-10-CM

## 2016-04-30 DIAGNOSIS — I6389 Other cerebral infarction: Secondary | ICD-10-CM

## 2016-05-01 NOTE — Progress Notes (Signed)
[facility] Summary Report / Loop Recorder 

## 2016-05-23 LAB — CUP PACEART REMOTE DEVICE CHECK: MDC IDC SESS DTM: 20170404223534

## 2016-05-23 NOTE — Progress Notes (Signed)
Carelink summary report received. Battery status OK. Normal device function. No new symptom episodes, tachy episodes, brady episodes. No new AF episodes. 3 pause episodes, undersensing. Monthly summary reports and ROV/PRN

## 2016-06-01 ENCOUNTER — Ambulatory Visit (INDEPENDENT_AMBULATORY_CARE_PROVIDER_SITE_OTHER): Payer: Medicare HMO | Admitting: *Deleted

## 2016-06-01 DIAGNOSIS — I6389 Other cerebral infarction: Secondary | ICD-10-CM

## 2016-06-01 DIAGNOSIS — I638 Other cerebral infarction: Secondary | ICD-10-CM | POA: Diagnosis not present

## 2016-06-01 NOTE — Progress Notes (Signed)
[facility] Summary Report / Loop Recorder 

## 2016-06-02 ENCOUNTER — Ambulatory Visit: Payer: Medicare HMO | Admitting: Neurology

## 2016-06-07 LAB — CUP PACEART REMOTE DEVICE CHECK: MDC IDC SESS DTM: 20170504230747

## 2016-06-07 NOTE — Progress Notes (Signed)
Carelink summary report received. Battery status OK. Normal device function. No new symptom episodes, tachy episodes, brady, or pause episodes. No new AF episodes. Monthly summary reports and ROV/PRN 

## 2016-06-29 ENCOUNTER — Ambulatory Visit (INDEPENDENT_AMBULATORY_CARE_PROVIDER_SITE_OTHER): Payer: Medicare HMO | Admitting: *Deleted

## 2016-06-29 DIAGNOSIS — I638 Other cerebral infarction: Secondary | ICD-10-CM

## 2016-06-29 DIAGNOSIS — I6389 Other cerebral infarction: Secondary | ICD-10-CM

## 2016-07-01 LAB — CUP PACEART REMOTE DEVICE CHECK: MDC IDC SESS DTM: 20170603233527

## 2016-07-01 NOTE — Progress Notes (Signed)
[facility] Summary Report / Loop Recorder 

## 2016-07-14 LAB — CUP PACEART REMOTE DEVICE CHECK: MDC IDC SESS DTM: 20170704000853

## 2016-07-29 ENCOUNTER — Ambulatory Visit (INDEPENDENT_AMBULATORY_CARE_PROVIDER_SITE_OTHER): Payer: Medicare HMO | Admitting: *Deleted

## 2016-07-29 DIAGNOSIS — I6389 Other cerebral infarction: Secondary | ICD-10-CM

## 2016-07-29 DIAGNOSIS — I638 Other cerebral infarction: Secondary | ICD-10-CM | POA: Diagnosis not present

## 2016-07-31 NOTE — Progress Notes (Signed)
[facility] Summary Report / Loop Recorder 

## 2016-08-23 LAB — CUP PACEART REMOTE DEVICE CHECK: Date Time Interrogation Session: 20170803003522

## 2016-08-28 ENCOUNTER — Ambulatory Visit (INDEPENDENT_AMBULATORY_CARE_PROVIDER_SITE_OTHER): Payer: Medicare HMO | Admitting: *Deleted

## 2016-08-28 DIAGNOSIS — I638 Other cerebral infarction: Secondary | ICD-10-CM | POA: Diagnosis not present

## 2016-08-28 DIAGNOSIS — I6389 Other cerebral infarction: Secondary | ICD-10-CM

## 2016-09-01 NOTE — Progress Notes (Signed)
[facility] Summary Report / Loop Recorder 

## 2016-09-21 LAB — CUP PACEART REMOTE DEVICE CHECK: Date Time Interrogation Session: 20170902010528

## 2016-09-28 ENCOUNTER — Ambulatory Visit (INDEPENDENT_AMBULATORY_CARE_PROVIDER_SITE_OTHER): Payer: Medicare HMO | Admitting: *Deleted

## 2016-09-28 DIAGNOSIS — I6389 Other cerebral infarction: Secondary | ICD-10-CM

## 2016-09-28 DIAGNOSIS — I638 Other cerebral infarction: Secondary | ICD-10-CM

## 2016-09-28 NOTE — Progress Notes (Signed)
[facility] Summary Report / Loop Recorder 

## 2016-10-27 ENCOUNTER — Ambulatory Visit (INDEPENDENT_AMBULATORY_CARE_PROVIDER_SITE_OTHER): Payer: Medicare HMO | Admitting: *Deleted

## 2016-10-27 DIAGNOSIS — I638 Other cerebral infarction: Secondary | ICD-10-CM

## 2016-10-27 DIAGNOSIS — I6389 Other cerebral infarction: Secondary | ICD-10-CM

## 2016-10-28 NOTE — Progress Notes (Signed)
[facility] Summary Report / Loop Recorder 

## 2016-11-06 LAB — CUP PACEART REMOTE DEVICE CHECK
Implantable Pulse Generator Implant Date: 20161020
MDC IDC SESS DTM: 20171002023618

## 2016-11-06 NOTE — Progress Notes (Signed)
Carelink summary report received. Battery status OK. Normal device function. No new symptom episodes, tachy episodes, or brady episodes. 1 pause- available ECG appears vent. undersensing, see ECG. No new AF episodes. Monthly summary reports and ROV/PRN

## 2016-11-26 ENCOUNTER — Ambulatory Visit (INDEPENDENT_AMBULATORY_CARE_PROVIDER_SITE_OTHER): Payer: Medicare HMO | Admitting: *Deleted

## 2016-11-26 DIAGNOSIS — I6389 Other cerebral infarction: Secondary | ICD-10-CM

## 2016-11-26 DIAGNOSIS — I638 Other cerebral infarction: Secondary | ICD-10-CM | POA: Diagnosis not present

## 2016-11-27 NOTE — Progress Notes (Signed)
[facility] Summary Report / Loop Recorder 

## 2016-12-05 LAB — CUP PACEART REMOTE DEVICE CHECK
Implantable Pulse Generator Implant Date: 20161020
MDC IDC SESS DTM: 20171101030830

## 2016-12-05 NOTE — Progress Notes (Signed)
Carelink summary report received. Battery status OK. Normal device function. No new symptom episodes, tachy episodes, brady, or pause episodes. No new AF episodes. Monthly summary reports and ROV/PRN 

## 2016-12-29 ENCOUNTER — Ambulatory Visit (INDEPENDENT_AMBULATORY_CARE_PROVIDER_SITE_OTHER): Payer: Medicare HMO | Admitting: *Deleted

## 2016-12-29 DIAGNOSIS — I6389 Other cerebral infarction: Secondary | ICD-10-CM

## 2016-12-29 DIAGNOSIS — I638 Other cerebral infarction: Secondary | ICD-10-CM

## 2016-12-30 NOTE — Progress Notes (Signed)
[facility]link Summary Report 

## 2017-01-06 LAB — CUP PACEART REMOTE DEVICE CHECK
Implantable Pulse Generator Implant Date: 20161020
MDC IDC SESS DTM: 20171201033822

## 2017-01-25 ENCOUNTER — Ambulatory Visit (INDEPENDENT_AMBULATORY_CARE_PROVIDER_SITE_OTHER): Payer: Medicare HMO | Admitting: *Deleted

## 2017-01-25 DIAGNOSIS — I6389 Other cerebral infarction: Secondary | ICD-10-CM

## 2017-01-25 DIAGNOSIS — I638 Other cerebral infarction: Secondary | ICD-10-CM

## 2017-01-26 NOTE — Progress Notes (Signed)
[facility] Summary Report / Loop Recorder 

## 2017-02-13 LAB — CUP PACEART REMOTE DEVICE CHECK
Date Time Interrogation Session: 20171231033731
MDC IDC PG IMPLANT DT: 20161020

## 2017-02-13 NOTE — Progress Notes (Signed)
Carelink summary report received. Battery status OK. Normal device function. No new symptom episodes, tachy episodes, brady, or pause episodes. No new AF episodes. Monthly summary reports and ROV/PRN 

## 2017-02-23 LAB — CUP PACEART REMOTE DEVICE CHECK
Implantable Pulse Generator Implant Date: 20161020
MDC IDC SESS DTM: 20180130043602

## 2017-02-23 NOTE — Progress Notes (Signed)
Carelink summary report received. Battery status OK. Normal device function. No new symptom episodes, tachy episodes, brady, or pause episodes. No new AF episodes. Monthly summary reports and ROV/PRN 

## 2017-02-24 ENCOUNTER — Ambulatory Visit (INDEPENDENT_AMBULATORY_CARE_PROVIDER_SITE_OTHER): Payer: Medicare HMO | Admitting: *Deleted

## 2017-02-24 DIAGNOSIS — I638 Other cerebral infarction: Secondary | ICD-10-CM

## 2017-02-24 DIAGNOSIS — I6389 Other cerebral infarction: Secondary | ICD-10-CM

## 2017-02-25 NOTE — Progress Notes (Signed)
[facility] Summary Report / Loop Recorder 

## 2017-03-12 LAB — CUP PACEART REMOTE DEVICE CHECK
Date Time Interrogation Session: 20180301043706
Implantable Pulse Generator Implant Date: 20161020

## 2017-03-26 ENCOUNTER — Ambulatory Visit (INDEPENDENT_AMBULATORY_CARE_PROVIDER_SITE_OTHER): Payer: Medicare HMO | Admitting: *Deleted

## 2017-03-26 DIAGNOSIS — I638 Other cerebral infarction: Secondary | ICD-10-CM | POA: Diagnosis not present

## 2017-03-26 DIAGNOSIS — I6389 Other cerebral infarction: Secondary | ICD-10-CM

## 2017-03-29 NOTE — Progress Notes (Signed)
[facility] Summary Report / Loop Recorder 

## 2017-04-08 LAB — CUP PACEART REMOTE DEVICE CHECK
Date Time Interrogation Session: 20180331053829
MDC IDC PG IMPLANT DT: 20161020

## 2017-04-26 ENCOUNTER — Ambulatory Visit (INDEPENDENT_AMBULATORY_CARE_PROVIDER_SITE_OTHER): Payer: Medicare HMO | Admitting: *Deleted

## 2017-04-26 DIAGNOSIS — I638 Other cerebral infarction: Secondary | ICD-10-CM

## 2017-04-26 DIAGNOSIS — I6389 Other cerebral infarction: Secondary | ICD-10-CM

## 2017-04-26 NOTE — Progress Notes (Signed)
[facility] Summary Report / Loop Recorder 

## 2017-05-05 ENCOUNTER — Telehealth: Payer: Self-pay | Admitting: Cardiology

## 2017-05-05 NOTE — Telephone Encounter (Signed)
Spoke w/ pt and requested that she send a manual transmission b/c her home monitor has not updated in at least 14 days.   

## 2017-05-09 LAB — CUP PACEART REMOTE DEVICE CHECK
Date Time Interrogation Session: 20180430053951
MDC IDC PG IMPLANT DT: 20161020

## 2017-05-09 NOTE — Progress Notes (Signed)
Carelink summary report received. Battery status OK. Normal device function. No new symptom episodes, tachy episodes, brady, or pause episodes. No new AF episodes. Monthly summary reports and ROV/PRN 

## 2017-05-26 ENCOUNTER — Ambulatory Visit (INDEPENDENT_AMBULATORY_CARE_PROVIDER_SITE_OTHER): Payer: Medicare HMO | Admitting: *Deleted

## 2017-05-26 DIAGNOSIS — I6389 Other cerebral infarction: Secondary | ICD-10-CM

## 2017-05-26 DIAGNOSIS — I638 Other cerebral infarction: Secondary | ICD-10-CM

## 2017-05-27 NOTE — Progress Notes (Signed)
[facility]link Summary Report 

## 2017-05-28 LAB — CUP PACEART REMOTE DEVICE CHECK
MDC IDC PG IMPLANT DT: 20161020
MDC IDC SESS DTM: 20180530060843

## 2017-06-25 ENCOUNTER — Ambulatory Visit (INDEPENDENT_AMBULATORY_CARE_PROVIDER_SITE_OTHER): Payer: Medicare HMO | Admitting: *Deleted

## 2017-06-25 DIAGNOSIS — I6389 Other cerebral infarction: Secondary | ICD-10-CM

## 2017-06-25 DIAGNOSIS — I638 Other cerebral infarction: Secondary | ICD-10-CM

## 2017-06-25 NOTE — Progress Notes (Signed)
[facility] Summary Report / Loop Recorder 

## 2017-07-05 LAB — CUP PACEART REMOTE DEVICE CHECK
Date Time Interrogation Session: 20180629063907
MDC IDC PG IMPLANT DT: 20161020

## 2017-07-26 ENCOUNTER — Ambulatory Visit (INDEPENDENT_AMBULATORY_CARE_PROVIDER_SITE_OTHER): Payer: Medicare HMO | Admitting: *Deleted

## 2017-07-26 DIAGNOSIS — I638 Other cerebral infarction: Secondary | ICD-10-CM

## 2017-07-26 DIAGNOSIS — I6389 Other cerebral infarction: Secondary | ICD-10-CM

## 2017-07-27 NOTE — Progress Notes (Signed)
[facility] Summary Report / Loop Recorder 

## 2017-08-07 LAB — CUP PACEART REMOTE DEVICE CHECK
Date Time Interrogation Session: 20180729124024
MDC IDC PG IMPLANT DT: 20161020

## 2017-08-07 NOTE — Progress Notes (Signed)
Carelink summary report received. Battery status OK. Normal device function. No new symptom episodes, tachy episodes, brady, or pause episodes. No new AF episodes. Monthly summary reports and ROV/PRN 

## 2017-08-24 ENCOUNTER — Ambulatory Visit (INDEPENDENT_AMBULATORY_CARE_PROVIDER_SITE_OTHER): Payer: Medicare HMO | Admitting: *Deleted

## 2017-08-24 DIAGNOSIS — I638 Other cerebral infarction: Secondary | ICD-10-CM

## 2017-08-24 DIAGNOSIS — I6389 Other cerebral infarction: Secondary | ICD-10-CM

## 2017-08-24 NOTE — Progress Notes (Signed)
[facility] Summary Report / Loop Recorder 

## 2017-08-26 LAB — CUP PACEART REMOTE DEVICE CHECK
Implantable Pulse Generator Implant Date: 20161020
MDC IDC SESS DTM: 20180828165026

## 2017-09-23 ENCOUNTER — Ambulatory Visit (INDEPENDENT_AMBULATORY_CARE_PROVIDER_SITE_OTHER): Payer: Medicare HMO | Admitting: *Deleted

## 2017-09-23 DIAGNOSIS — I638 Other cerebral infarction: Secondary | ICD-10-CM

## 2017-09-23 DIAGNOSIS — I6389 Other cerebral infarction: Secondary | ICD-10-CM

## 2017-09-24 LAB — CUP PACEART REMOTE DEVICE CHECK
Date Time Interrogation Session: 20180927173637
Implantable Pulse Generator Implant Date: 20161020

## 2017-09-24 NOTE — Progress Notes (Signed)
[facility] Summary Report / Loop Recorder 

## 2017-10-20 ENCOUNTER — Telehealth: Payer: Self-pay | Admitting: Cardiology

## 2017-10-20 NOTE — Telephone Encounter (Signed)
Spoke w/ pt and requested that she send a manual transmission b/c her home monitor has not updated in at least 14 days. Pt stated that she does not use it anymore.

## 2017-10-25 ENCOUNTER — Ambulatory Visit (INDEPENDENT_AMBULATORY_CARE_PROVIDER_SITE_OTHER): Payer: Medicare HMO | Admitting: *Deleted

## 2017-10-25 DIAGNOSIS — I6389 Other cerebral infarction: Secondary | ICD-10-CM | POA: Diagnosis not present

## 2017-10-26 NOTE — Progress Notes (Signed)
[facility] Summary Report / Loop Recorder 

## 2017-10-29 ENCOUNTER — Encounter: Payer: Self-pay | Admitting: Cardiology

## 2017-10-29 LAB — CUP PACEART REMOTE DEVICE CHECK
Date Time Interrogation Session: 20181027180945
Implantable Pulse Generator Implant Date: 20161020

## 2017-11-11 ENCOUNTER — Encounter: Payer: Self-pay | Admitting: Cardiology

## 2017-11-22 ENCOUNTER — Ambulatory Visit: Payer: Medicare HMO | Admitting: *Deleted

## 2017-11-23 NOTE — Progress Notes (Signed)
Not received  

## 2018-01-31 ENCOUNTER — Ambulatory Visit: Payer: Medicare HMO | Attending: Family Medicine | Admitting: Physical Therapy

## 2018-01-31 ENCOUNTER — Encounter: Payer: Self-pay | Admitting: Physical Therapy

## 2018-01-31 ENCOUNTER — Other Ambulatory Visit: Payer: Self-pay

## 2018-01-31 DIAGNOSIS — M6281 Muscle weakness (generalized): Secondary | ICD-10-CM

## 2018-01-31 DIAGNOSIS — R42 Dizziness and giddiness: Secondary | ICD-10-CM

## 2018-01-31 DIAGNOSIS — Z9181 History of falling: Secondary | ICD-10-CM | POA: Diagnosis present

## 2018-01-31 DIAGNOSIS — R2681 Unsteadiness on feet: Secondary | ICD-10-CM

## 2018-01-31 NOTE — Therapy (Signed)
[facility] [address] [place], [place], [address] Phone: [phone]   Fax:  [phone]  Physical Therapy Evaluation  Patient Details  Name: Nicole Ruiz MRN: [id number] Date of Birth: 11-03-1941 Referring Provider: Bernerd Limbo, MD    Encounter Date: 01/31/2018  PT End of Session - 01/31/18 1629    Visit Number  1    Number of Visits  16    Date for PT Re-Evaluation  03/28/18    Authorization Type  [employer]     Authorization Time Period  01/31/18 to 03/28/18    PT Start Time  1447    PT Stop Time  1528    PT Time Calculation (min)  41 min    Activity Tolerance  No increased pain;Patient tolerated treatment well    Behavior During Therapy  [facility] for tasks assessed/performed       Past Medical History:  Diagnosis Date  . 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 Chronic kidney disease   . Depression   . Hyperlipidemia   . Hypertension   . Stroke [facility])     Past Surgical History:  Procedure Laterality Date  . ABDOMINAL HYSTERECTOMY    . ARM SURGERY    . CARDIAC CATHETERIZATION  2013   negative  . EP IMPLANTABLE DEVICE N/A 10/17/2015   Procedure: Loop Recorder Insertion;  Surgeon: Will Meredith Leeds, MD;  Location: [facility];  Service: Cardiovascular;  Laterality: N/A;  . EYE SURGERY    . FEMUR IM NAIL Right 10/20/2015   Procedure: INTRAMEDULLARY (IM) RETROGRADE FEMORAL NAILING;  Surgeon: Tania Ade, MD;  Location: [facility];  Service: Orthopedics;  Laterality: Right;  . HEMORROIDECTOMY    . KNEE SURGERY    . TEE WITHOUT CARDIOVERSION N/A 10/17/2015   Procedure: TRANSESOPHAGEAL ECHOCARDIOGRAM (TEE);  Surgeon: Jerline Pain, MD;  Location: [facility];  Service: Cardiovascular;  Laterality: N/A;    There were no vitals filed for this visit.   Subjective Assessment - 01/31/18 1451    Subjective  Pt reports that over the past 2 years or so, she has has several bad falls, one  resulting in Rt femur fracture with repair. She also had a stroke in 2018 which she had some therapy for. She has been trying to work on some exercises in the house.     Pertinent History  stroke 2018, genitourinary cancer, Rt femur fracture with IM nail placement 10/20/15    Patient Stated Goals  improve independence and be able to get out in the community    Currently in Pain?  -- general aches and pains, RLE always bothers her         [facility] PT Assessment - 01/31/18 0001      Assessment   Medical Diagnosis  dizziness and giddiness    Referring Provider  Bernerd Limbo, MD     Hand Dominance  Right    Prior Therapy  in the hospital following stroke       Precautions   Precautions  Fall      Restrictions   Weight Bearing Restrictions  No      Balance Screen   Has the patient fallen in the past 6 months  Yes    How many times?  5 just when up and moving around    Has the patient had a decrease in activity level because of a fear of falling?   Yes    Is the  patient reluctant to leave their home because of a fear of falling?   Yes      [place] residence    Living Arrangements  Spouse/significant other    Additional Comments  has grab bars in the bathroom; stairs from the den into the kitchen (handrails both sides)       Prior Function   Level of Independence  Needs assistance with ADLs      ROM / Strength   AROM / PROM / Strength  Strength      Strength   Strength Assessment Site  Hip;Knee;Ankle    Right/Left Hip  Right;Left    Right Hip Flexion  3/5    Right Hip Extension  3/5    Right Hip ABduction  3/5    Left Hip Flexion  3/5    Left Hip Extension  3/5    Left Hip ABduction  3/5    Right/Left Knee  Right;Left    Right Knee Flexion  3+/5    Right Knee Extension  3-/5 (+) pain     Left Knee Flexion  3+/5    Left Knee Extension  4/5    Right/Left Ankle  Right;Left    Right Ankle Dorsiflexion  4+/5    Left Ankle Dorsiflexion   4+/5 (+) pain in Rt knee       Transfers   Five time sit to stand comments   22 sec, UE on thighs      Ambulation/Gait   Pre-Gait Activities  Ascends steps with Lt step to pattern and descends  steps with Rt step to pattern using B handrails     Gait Comments  uses quad cane in RUE, unsteady gait with occasional use of 2 hand hold on the cane.       Standardized Balance Assessment   Standardized Balance Assessment  Timed Up and Go Test;Berg Balance Test      Berg Balance Test   Sit to Stand  Able to stand  independently using hands    Standing Unsupported  Able to stand 30 seconds unsupported    Sitting with Back Unsupported but Feet Supported on Floor or Stool  Able to sit safely and securely 2 minutes    Stand to Sit  Controls descent by using hands    Transfers  Able to transfer with verbal cueing and /or supervision    Standing Unsupported with Eyes Closed  Able to stand 10 seconds with supervision    Standing Ubsupported with Feet Together  Needs help to attain position but able to stand for 30 seconds with feet together    From Standing, Reach Forward with Outstretched Arm  Loses balance while trying/requires external support    From Standing Position, Pick up Object from Floor  Unable to try/needs assist to keep balance    From Standing Position, Turn to Look Behind Over each Shoulder  Needs supervision when turning    Turn 360 Degrees  Needs close supervision or verbal cueing    Standing Unsupported, Alternately Place Feet on Step/Stool  Needs assistance to keep from falling or unable to try    Standing Unsupported, One Foot in [employer] balance while stepping or standing    Standing on One Leg  Tries to lift leg/unable to hold 3 seconds but remains standing independently    Total Score  21             Objective measurements completed on  examination: See above findings.              PT Education - 01/31/18 1628    Education provided  Yes    Education  Details  eval findings/POC; impact vision can play on balance     Person(s) Educated  Patient    Methods  Explanation    Comprehension  Verbalized understanding       PT Short Term Goals - 01/31/18 1639      PT SHORT TERM GOAL #1   Title  Pt will demo consistency and independence with her HEP to improve LE strength.     Time  2    Period  Weeks    Status  New    Target Date  02/14/18      PT SHORT TERM GOAL #2   Title  Pt will utilize her RW during ambulation at home and in the community to increase her safety and decrease risk of falling during the initial weeks of her POC.     Time  4    Period  Weeks    Status  New    Target Date  02/28/18        PT Long Term Goals - 01/31/18 1801      PT LONG TERM GOAL #1   Title  Pt will demo improved BLE strength to atleast 4/5 MMT which will increase her safety and efficiency with daily activity.     Time  8    Period  Weeks    Status  New    Target Date  03/28/18      PT LONG TERM GOAL #2   Title  Pt will complete 5x sit to stand in less than 14 sec with UE support, to demonstrate improvements in functional strength and power of the LEs.     Time  8    Period  Weeks    Status  New      PT LONG TERM GOAL #3   Title  Pt will demo atleast 9 pts improvement on the Berg Balance test to reflect a decrease in her risk of falling.     Time  8    Period  Weeks    Status  New      PT LONG TERM GOAL #4   Title  Pt will complete the TUG with LRAD in less than 20 sec to demonstrate a significant improvement in her functional balance and mobility.     Time  8    Period  Weeks    Status  New      PT LONG TERM GOAL #5   Title  Pt will be able to ascend and descend the clinic steps x2 trials with handrails and reciprocal pattern without supervision, to allow her to safely get to the den in her home without the need for caregiver assistance.     Time  8    Period  Weeks    Status  New             Plan - 01/31/18 1631     Clinical Impression Statement  Pt is a pleasant 77 y.o with history of Rt femur fracture with repair, CVA in 2018, and increasing issues with vision and tremors leading to worsening balance and multiple falls over the past 1-2 years. Pt demonstrates significantly limited LE strength, with discomfort of the Rt knee, with poor functional strength and performance on 5x sit to stand and TUG. In  addition, she currently ambulates with a quad cane, with a score of 21/56 on the Berg Balance test, placing her at a severe risk of falling. Pt would benefit from skilled PT to address her limitations in strength and improve her overall stamina and proprioception to allow for safe completion of ADLs without as much assistance from her spouse. Due to the pt's low score on the Berg and noted unsteadiness during today's evaluation, she was encouraged to use her rolling walking for support until her strength and balance is improved. Pt verbalized understanding with this and was agreeable with the proposed PT POC and frequency.     History and Personal Factors relevant to plan of care:  stroke in 2018; fall with Rt femur fracture and IM nail placement in 2016, HTN, history of genitourinary cancer    Clinical Presentation  Evolving    Clinical Presentation due to:  worsening balance and vision over the past several months     Clinical Decision Making  High    Rehab Potential  Good    PT Frequency  2x / week    PT Duration  8 weeks    PT Treatment/Interventions  ADLs/Self Care Home Management;Moist Heat;Balance training;Therapeutic exercise;Functional mobility training;Stair training;Gait training;Neuromuscular re-education;Patient/family education;Manual techniques;Passive range of motion;Taping;Vestibular    PT Next Visit Plan  HEP next session: LE strengthening     PT Home Exercise Plan  next session     Recommended Other Services  possible vestibular component and need for referral to OP neuro        Patient will  benefit from skilled therapeutic intervention in order to improve the following deficits and impairments:  Abnormal gait, Decreased balance, Difficulty walking, Decreased strength, Decreased endurance, Decreased activity tolerance, Decreased mobility, Pain, Improper body mechanics, Dizziness  Visit Diagnosis: Unsteadiness on feet  Muscle weakness (generalized)  History of falling  Dizziness and giddiness     Problem List Patient Active Problem List   Diagnosis Date Noted  . At risk for falling 12/20/2015  . Cerebrovascular accident, 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  . Femur fracture, right ([place]) 10/20/2015  . Fracture of distal femur, right, closed, with routine healing, subsequent encounter 10/20/2015  . Fracture of distal end of femur ([place]) 10/20/2015  . Fall 10/19/2015  . Dyslipidemia 10/19/2015  . Nodule of right lung 10/19/2015  . Lung mass 10/19/2015  . CVA (cerebral infarction) 10/15/2015  . Left middle cerebral artery stroke ([place]) 10/15/2015  . Hyperlipidemia 10/15/2015  . Stroke (cerebrum) ([place]) 10/15/2015  . Cerebral infarction ([place]) 10/15/2015  . 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[place]) 10/15/2015  . Cerebrovascular accident (CVA) ([place]) 10/15/2015  . Episode of syncope 07/17/2015  . Arthralgia of hip 07/09/2015  . Idiopathic insomnia 07/09/2015  . Arthralgia of lower leg 04/06/2014  . Unspecified arthropathy, lower leg 12/18/2013  . Acute posthemorrhagic anemia 12/18/2013  . Esophageal reflux 12/18/2013  . Acid reflux 12/18/2013  . Abnormal finding on radiology exam 09/19/2013  . Acquired cyst of kidney 09/19/2013  . Chronic kidney disease (CKD), stage III (moderate) ([place]) 09/19/2013  . Clinical depression 09/19/2013  . Bone/cartilage disorder 09/19/2013  . Arthritis of knee, degenerative 09/19/2013  . Avitaminosis D 09/19/2013  . Hypertension 05/29/2012  . Closed fracture of metatarsal bone 12/18/2011  . HYPERLIPIDEMIA 03/06/2009  . DIZZINESS  03/06/2009  . Palpitations 03/06/2009  . CHEST PAIN 03/06/2009    6:13 PM,01/31/18 Sherol Dade PT, DPT [place] [facility] [place]  [facility] [address], [place], [place], [address] Phone: [phone]   Fax:  [phone]  Name: KYA [place] MRN: [id number] Date of Birth: 02/01/1941

## 2018-02-08 ENCOUNTER — Encounter: Payer: Self-pay | Admitting: Physical Therapy

## 2018-02-08 ENCOUNTER — Ambulatory Visit: Payer: Medicare HMO | Admitting: Physical Therapy

## 2018-02-08 DIAGNOSIS — Z9181 History of falling: Secondary | ICD-10-CM

## 2018-02-08 DIAGNOSIS — R42 Dizziness and giddiness: Secondary | ICD-10-CM

## 2018-02-08 DIAGNOSIS — R2681 Unsteadiness on feet: Secondary | ICD-10-CM | POA: Diagnosis not present

## 2018-02-08 DIAGNOSIS — M6281 Muscle weakness (generalized): Secondary | ICD-10-CM

## 2018-02-08 NOTE — Patient Instructions (Signed)
   ELASTIC BAND - SEATED CLAMS  While sitting in a chair and an elastic band wrapped around your knees, move both knees to the sides to separate your legs. Keep contact of your feet on the floor the entire time.   x10 reps, complete 1-2 sets each day.     Seated Hip Adduction  Sit on edge of seat with upright posture. Put pillow between knees. While maintaining proper posture squeeze pillow with knees and hold for 3 seconds. Complete 15 reps.      HEEL RAISES - PLANTARFLEXION - BILATERAL  Start with your entire foot on the ground.  Next, raise up your heels as you press your toes down.  Keep your toes on the ground the entire time. Place hands on knees and push down for more resistance. x20 reps.      TOES RAISES - DORSIFLEXION - BOTH  Start with your feet on the ground.  Next, raise up both forefeet and toes as shown as you bend at your ankle.  Keep your heels on the ground the entire time.  x20 reps    LONG ARC QUAD - LAQ - HIGH SEAT  While seated with your knee in a bent position, slowly straighten your knee as you raise your foot upwards as shown. x8-10 reps, using 5 pound weights.   [facility] [address] [place], [place] [address] Phone # [phone] Fax [phone]

## 2018-02-08 NOTE — Therapy (Signed)
[facility] [address], [place], [place], [address] Phone: [phone]   Fax:  [phone]  Physical Therapy Treatment  Patient Details  Name: Nicole Ruiz MRN: [id number] Date of Birth: [birth year] Referring Provider: Bernerd Limbo, MD    Encounter Date: 02/08/2018  PT End of Session - 02/08/18 1441    Visit Number  2    Number of Visits  16    Date for PT Re-Evaluation  03/28/18    Authorization Type  [employer]     Authorization Time Period  01/31/18 to 03/28/18    PT Start Time  1600    PT Stop Time  1640    PT Time Calculation (min)  40 min    Activity Tolerance  No increased pain;Patient tolerated treatment well    Behavior During Therapy  [facility] for tasks assessed/performed       Past Medical History:  Diagnosis Date  . 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 Chronic kidney disease   . Depression   . Hyperlipidemia   . Hypertension   . Stroke [facility])     Past Surgical History:  Procedure Laterality Date  . ABDOMINAL HYSTERECTOMY    . ARM SURGERY    . CARDIAC CATHETERIZATION  2013   negative  . EP IMPLANTABLE DEVICE N/A 10/17/2015   Procedure: Loop Recorder Insertion;  Surgeon: Will Meredith Leeds, MD;  Location: [facility];  Service: Cardiovascular;  Laterality: N/A;  . EYE SURGERY    . FEMUR IM NAIL Right 10/20/2015   Procedure: INTRAMEDULLARY (IM) RETROGRADE FEMORAL NAILING;  Surgeon: Tania Ade, MD;  Location: [facility];  Service: Orthopedics;  Laterality: Right;  . HEMORROIDECTOMY    . KNEE SURGERY    . TEE WITHOUT CARDIOVERSION N/A 10/17/2015   Procedure: TRANSESOPHAGEAL ECHOCARDIOGRAM (TEE);  Surgeon: Jerline Pain, MD;  Location: [facility];  Service: Cardiovascular;  Laterality: N/A;    There were no vitals filed for this visit.  Subjective Assessment - 02/08/18 1403    Subjective  Pt reports that things are going ok. Her knee continues to bother her. She states she  gets on her treadmill at home maybe once a day and will walk as long as she can without her knee giving out on her.     Pertinent History  stroke 2018, genitourinary cancer, Rt femur fracture with IM nail placement 10/20/15    Patient Stated Goals  improve independence and be able to get out in the community    Currently in Pain?  No/denies Rt knee always bothering pt, no rating given               [facility] Adult PT Treatment/Exercise - 02/08/18 0001      Exercises   Exercises  Knee/Hip      Knee/Hip Exercises: Aerobic   Elliptical  L1 x5 min, pt encouraged to stay above 70 SPM      Knee/Hip Exercises: Seated   Long Arc Quad  Both;1 set    [employer] Weight  5 lbs.    Long [employer] Limitations  8 reps on Rt, 10 reps on [employer] reps, 3 sec hold     Clamshell with TheraBand  Red single leg x10 reps each     Hamstring Curl  Both;1 set;10 reps    Hamstring Limitations  double red TB          Balance  Exercises - 02/08/18 1430      Balance Exercises: Standing   Standing Eyes Opened  Narrow base of support (BOS);Foam/compliant surface;Other (comment) trunk rotation Lt/Rt x10 reps     Standing Eyes Closed  Narrow base of support (BOS);2 reps;30 secs    Partial Tandem Stance  Eyes open;2 reps;30 secs    Marching Limitations  2x10 reps each without UE support, CGA to MinA         PT Education - 02/08/18 1441    Education provided  Yes    Education Details  implemented HEP    Person(s) Educated  Patient    Methods  Explanation;Handout    Comprehension  Verbalized understanding;Returned demonstration       PT Short Term Goals - 01/31/18 1639      PT SHORT TERM GOAL #1   Title  Pt will demo consistency and independence with her HEP to improve LE strength.     Time  2    Period  Weeks    Status  New    Target Date  02/14/18      PT SHORT TERM GOAL #2   Title  Pt will utilize her RW during ambulation at home and in the community to increase her  safety and decrease risk of falling during the initial weeks of her POC.     Time  4    Period  Weeks    Status  New    Target Date  02/28/18        PT Long Term Goals - 01/31/18 1801      PT LONG TERM GOAL #1   Title  Pt will demo improved BLE strength to atleast 4/5 MMT which will increase her safety and efficiency with daily activity.     Time  8    Period  Weeks    Status  New    Target Date  03/28/18      PT LONG TERM GOAL #2   Title  Pt will complete 5x sit to stand in less than 14 sec with UE support, to demonstrate improvements in functional strength and power of the LEs.     Time  8    Period  Weeks    Status  New      PT LONG TERM GOAL #3   Title  Pt will demo atleast 9 pts improvement on the Berg Balance test to reflect a decrease in her risk of falling.     Time  8    Period  Weeks    Status  New      PT LONG TERM GOAL #4   Title  Pt will complete the TUG with LRAD in less than 20 sec to demonstrate a significant improvement in her functional balance and mobility.     Time  8    Period  Weeks    Status  New      PT LONG TERM GOAL #5   Title  Pt will be able to ascend and descend the clinic steps x2 trials with handrails and reciprocal pattern without supervision, to allow her to safely get to the den in her home without the need for caregiver assistance.     Time  8    Period  Weeks    Status  New            Plan - 02/08/18 1441    Clinical Impression Statement  Pt's HEP was reviewed and implemented this session.  Focused primarily on seated therex and pt was able to complete without significant difficulty. Therapist had to provide intermittent cues for proper technique initially. Also addressed balance and proprioception with static activity, therapist providing cues to relax the shoulder/neck and improve pt's breathing throughout this portion of activity. Ended session without report of increased pain, and pt noted some muscle fatigue only.     Rehab  Potential  Good    PT Frequency  2x / week    PT Duration  8 weeks    PT Treatment/Interventions  ADLs/Self Care Home Management;Moist Heat;Balance training;Therapeutic exercise;Functional mobility training;Stair training;Gait training;Neuromuscular re-education;Patient/family education;Manual techniques;Passive range of motion;Taping;Vestibular    PT Next Visit Plan  HEP next session: LE strengthening     PT Home Exercise Plan  next session        Patient will benefit from skilled therapeutic intervention in order to improve the following deficits and impairments:  Abnormal gait, Decreased balance, Difficulty walking, Decreased strength, Decreased endurance, Decreased activity tolerance, Decreased mobility, Pain, Improper body mechanics, Dizziness  Visit Diagnosis: Unsteadiness on feet  Muscle weakness (generalized)  History of falling  Dizziness and giddiness     Problem List Patient Active Problem List   Diagnosis Date Noted  . At risk for falling 12/20/2015  . Cerebrovascular accident, 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  . Femur fracture, right ([place]) 10/20/2015  . Fracture of distal femur, right, closed, with routine healing, subsequent encounter 10/20/2015  . Fracture of distal end of femur ([place]) 10/20/2015  . Fall 10/19/2015  . Dyslipidemia 10/19/2015  . Nodule of right lung 10/19/2015  . Lung mass 10/19/2015  . CVA (cerebral infarction) 10/15/2015  . Left middle cerebral artery stroke ([place]) 10/15/2015  . Hyperlipidemia 10/15/2015  . Stroke (cerebrum) ([place]) 10/15/2015  . Cerebral infarction ([place]) 10/15/2015  . 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[place]) 10/15/2015  . Cerebrovascular accident (CVA) ([place]) 10/15/2015  . Episode of syncope 07/17/2015  . Arthralgia of hip 07/09/2015  . Idiopathic insomnia 07/09/2015  . Arthralgia of lower leg 04/06/2014  . Unspecified arthropathy, lower leg 12/18/2013  . Acute posthemorrhagic anemia 12/18/2013  . Esophageal  reflux 12/18/2013  . Acid reflux 12/18/2013  . Abnormal finding on radiology exam 09/19/2013  . Acquired cyst of kidney 09/19/2013  . Chronic kidney disease (CKD), stage III (moderate) ([place]) 09/19/2013  . Clinical depression 09/19/2013  . Bone/cartilage disorder 09/19/2013  . Arthritis of knee, degenerative 09/19/2013  . Avitaminosis D 09/19/2013  . Hypertension 05/29/2012  . Closed fracture of metatarsal bone 12/18/2011  . HYPERLIPIDEMIA 03/06/2009  . DIZZINESS 03/06/2009  . Palpitations 03/06/2009  . CHEST PAIN 03/06/2009   2:45 PM,02/08/18 Sherol Dade PT, DPT [facility] [address], [place], [place], [address] Phone: [phone]   Fax:  [phone]  Name: Nicole Ruiz MRN: [id number] Date of Birth: 1941/09/29

## 2018-02-15 ENCOUNTER — Ambulatory Visit: Payer: Medicare HMO | Admitting: Physical Therapy

## 2018-02-15 ENCOUNTER — Encounter: Payer: Self-pay | Admitting: Physical Therapy

## 2018-02-15 DIAGNOSIS — R2681 Unsteadiness on feet: Secondary | ICD-10-CM | POA: Diagnosis not present

## 2018-02-15 DIAGNOSIS — M6281 Muscle weakness (generalized): Secondary | ICD-10-CM

## 2018-02-15 DIAGNOSIS — R42 Dizziness and giddiness: Secondary | ICD-10-CM

## 2018-02-15 DIAGNOSIS — Z9181 History of falling: Secondary | ICD-10-CM

## 2018-02-15 NOTE — Therapy (Signed)
[facility] [address] [place], [place], [address] Phone: [phone]   Fax:  [phone]  Physical Therapy Treatment  Patient Details  Name: Nicole Ruiz MRN: [id number] Date of Birth: July 20, 1941 Referring Provider: Bernerd Limbo, MD    Encounter Date: 02/15/2018  PT End of Session - 02/15/18 1407    Visit Number  3    Number of Visits  16    Date for PT Re-Evaluation  03/28/18    Authorization Type  [employer]     Authorization Time Period  01/31/18 to 03/28/18    PT Start Time  1401    PT Stop Time  1445    PT Time Calculation (min)  44 min    Activity Tolerance  No increased pain;Patient tolerated treatment well    Behavior During Therapy  [facility] for tasks assessed/performed       Past Medical History:  Diagnosis Date  . 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 Chronic kidney disease   . Depression   . Hyperlipidemia   . Hypertension   . Stroke [facility])     Past Surgical History:  Procedure Laterality Date  . ABDOMINAL HYSTERECTOMY    . ARM SURGERY    . CARDIAC CATHETERIZATION  2013   negative  . EP IMPLANTABLE DEVICE N/A 10/17/2015   Procedure: Loop Recorder Insertion;  Surgeon: Will Meredith Leeds, MD;  Location: [place] [facility];  Service: Cardiovascular;  Laterality: N/A;  . EYE SURGERY    . FEMUR IM NAIL Right 10/20/2015   Procedure: INTRAMEDULLARY (IM) RETROGRADE FEMORAL NAILING;  Surgeon: Tania Ade, MD;  Location: [place];  Service: Orthopedics;  Laterality: Right;  . HEMORROIDECTOMY    . KNEE SURGERY    . TEE WITHOUT CARDIOVERSION N/A 10/17/2015   Procedure: TRANSESOPHAGEAL ECHOCARDIOGRAM (TEE);  Surgeon: Jerline Pain, MD;  Location: [facility] ENDOSCOPY;  Service: Cardiovascular;  Laterality: N/A;    There were no vitals filed for this visit.  Subjective Assessment - 02/15/18 1405    Subjective  Pt reports that things are going well. She is completing her HEP multiple times a day  and some of them are getting easy.    Pertinent History  stroke 2018, genitourinary cancer, Rt femur fracture with IM nail placement 10/20/15    Patient Stated Goals  improve independence and be able to get out in the community    Currently in Pain?  No/denies                      [facility] Adult PT Treatment/Exercise - 02/15/18 0001      Knee/Hip Exercises: Aerobic   Elliptical  L3 x5 min, PT present to discuss HEP adjustments       Knee/Hip Exercises: Standing   Heel Raises  Both;1 set;20 reps;Limitations    Heel Raises Limitations  2nd set single leg x10 reps each       Knee/Hip Exercises: Seated   Hamstring Curl  Both;2 sets;10 reps    Hamstring Limitations  red TB, cues to decreased PF action/compensation      Knee/Hip Exercises: Supine   Bridges  Both;1 set;15 reps      Knee/Hip Exercises: Sidelying   Clams  x10 reps each with green TB, PT cuing to decrease trunk rotation          Balance Exercises - 02/15/18 1433      Balance Exercises: Standing   Standing Eyes Opened  Narrow base of support (BOS);Foam/compliant surface;2 reps;30 secs;Other (comment) EC    Tandem Stance  Eyes open;2 reps;Intermittent upper extremity support;30 secs;Foam/compliant surface    Other Standing Exercises  weight shifting on foam pad x10 reps each: Lt/Rt and partial tandem Lt/Rt forward         PT Education - 02/15/18 1447    Education provided  Yes    Education Details  noted anxiety and breath holding with this activity; HEP updates    Person(s) Educated  Patient    Methods  Explanation;Handout    Comprehension  Verbalized understanding;Returned demonstration       PT Short Term Goals - 02/15/18 1542      PT SHORT TERM GOAL #1   Title  Pt will demo consistency and independence with her HEP to improve LE strength.     Time  2    Period  Weeks    Status  Achieved      PT SHORT TERM GOAL #2   Title  Pt will utilize her RW during ambulation at home and in the  community to increase her safety and decrease risk of falling during the initial weeks of her POC.     Time  4    Period  Weeks    Status  Not Met        PT Long Term Goals - 01/31/18 1801      PT LONG TERM GOAL #1   Title  Pt will demo improved BLE strength to atleast 4/5 MMT which will increase her safety and efficiency with daily activity.     Time  8    Period  Weeks    Status  New    Target Date  03/28/18      PT LONG TERM GOAL #2   Title  Pt will complete 5x sit to stand in less than 14 sec with UE support, to demonstrate improvements in functional strength and power of the LEs.     Time  8    Period  Weeks    Status  New      PT LONG TERM GOAL #3   Title  Pt will demo atleast 9 pts improvement on the Berg Balance test to reflect a decrease in her risk of falling.     Time  8    Period  Weeks    Status  New      PT LONG TERM GOAL #4   Title  Pt will complete the TUG with LRAD in less than 20 sec to demonstrate a significant improvement in her functional balance and mobility.     Time  8    Period  Weeks    Status  New      PT LONG TERM GOAL #5   Title  Pt will be able to ascend and descend the clinic steps x2 trials with handrails and reciprocal pattern without supervision, to allow her to safely get to the den in her home without the need for caregiver assistance.     Time  8    Period  Weeks    Status  New            Plan - 02/15/18 1529    Clinical Impression Statement  Pt arrives today with noted improvements in strength evident by her report of HEP getting too easy. Completed LE therex with progressions to add to pt's HEP and noted need for technique adjustments with hamstring curls due to weakness  of this muscle group. Ended with balance activity, and during tandem hold pt became highly anxious and dizzy. Pt was instructed to sit down and this resolved after several minutes of encouraged deep breathing. Pt was able to ambulate out of the clinic reporting  resolved symptoms.     Rehab Potential  Good    PT Frequency  2x / week    PT Duration  8 weeks    PT Treatment/Interventions  ADLs/Self Care Home Management;Moist Heat;Balance training;Therapeutic exercise;Functional mobility training;Stair training;Gait training;Neuromuscular re-education;Patient/family education;Manual techniques;Passive range of motion;Taping;Vestibular    PT Next Visit Plan  progress LE strength and balance    PT Home Exercise Plan  --    Consulted and Agree with Plan of Care  Patient       Patient will benefit from skilled therapeutic intervention in order to improve the following deficits and impairments:  Abnormal gait, Decreased balance, Difficulty walking, Decreased strength, Decreased endurance, Decreased activity tolerance, Decreased mobility, Pain, Improper body mechanics, Dizziness  Visit Diagnosis: Unsteadiness on feet  Muscle weakness (generalized)  History of falling  Dizziness and giddiness     Problem List Patient Active Problem List   Diagnosis Date Noted  . At risk for falling 12/20/2015  . Cerebrovascular accident, 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  . Femur fracture, right ([place]) 10/20/2015  . Fracture of distal femur, right, closed, with routine healing, subsequent encounter 10/20/2015  . Fracture of distal end of femur ([place]) 10/20/2015  . Fall 10/19/2015  . Dyslipidemia 10/19/2015  . Nodule of right lung 10/19/2015  . Lung mass 10/19/2015  . CVA (cerebral infarction) 10/15/2015  . Left middle cerebral artery stroke ([place]) 10/15/2015  . Hyperlipidemia 10/15/2015  . Stroke (cerebrum) ([place]) 10/15/2015  . Cerebral infarction ([place]) 10/15/2015  . 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[place]) 10/15/2015  . Cerebrovascular accident (CVA) ([place]) 10/15/2015  . Episode of syncope 07/17/2015  . Arthralgia of hip 07/09/2015  . Idiopathic insomnia 07/09/2015  . Arthralgia of lower leg 04/06/2014  . Unspecified arthropathy, lower leg  12/18/2013  . Acute posthemorrhagic anemia 12/18/2013  . Esophageal reflux 12/18/2013  . Acid reflux 12/18/2013  . Abnormal finding on radiology exam 09/19/2013  . Acquired cyst of kidney 09/19/2013  . Chronic kidney disease (CKD), stage III (moderate) ([place]) 09/19/2013  . Clinical depression 09/19/2013  . Bone/cartilage disorder 09/19/2013  . Arthritis of knee, degenerative 09/19/2013  . Avitaminosis D 09/19/2013  . Hypertension 05/29/2012  . Closed fracture of metatarsal bone 12/18/2011  . HYPERLIPIDEMIA 03/06/2009  . DIZZINESS 03/06/2009  . Palpitations 03/06/2009  . CHEST PAIN 03/06/2009   3:43 PM,02/15/18 Sherol Dade PT, DPT [place] [facility] [place]  [facility] [address], [place], [place], [address] Phone: [phone]   Fax:  [phone]  Name: Nicole Ruiz MRN: [id number] Date of Birth: 04/11/1941

## 2018-02-15 NOTE — Patient Instructions (Addendum)
    ELASTIC BAND - SIDELYING CLAM - [place]HE[place]   While lying on your side with your knees bent and an elastic band wrapped around your knees, draw up the top knee while keeping contact of your feet together as shown.   Do not let your pelvis roll back during the lifting movement.   x10 reps each side. Using red band, then increasing to green band  Make sure your upper body and trunk does not twist.        ELASTIC BAND - HAMSTRING CURL  While seated and an elastic band attched to your ankle, bend your knee and draw back your foot. 10 reps, work up to 15-20 reps   Ankle must stay bent*  Use the red band.      LONG ARC QUAD - LAQ - HIGH SEAT  While seated with your knee in a bent position, slowly straighten your knee as you raise your foot upwards as shown.    Try to lower with a count of 3 seconds. x10-15 reps.      STANDING HEEL RAISES - SINGLE LEG  While standing on one leg, raise up on your toes as you lift your heel off the ground.  Work up to [employer].    [place] [address], [place], [place] [address] Phone # [phone] Fax [phone]

## 2018-02-22 ENCOUNTER — Ambulatory Visit: Payer: Medicare HMO | Admitting: Physical Therapy

## 2018-02-22 DIAGNOSIS — M6281 Muscle weakness (generalized): Secondary | ICD-10-CM

## 2018-02-22 DIAGNOSIS — R2681 Unsteadiness on feet: Secondary | ICD-10-CM | POA: Diagnosis not present

## 2018-02-22 DIAGNOSIS — R42 Dizziness and giddiness: Secondary | ICD-10-CM

## 2018-02-22 DIAGNOSIS — Z9181 History of falling: Secondary | ICD-10-CM

## 2018-02-22 NOTE — Therapy (Signed)
[facility] [address] [place], [place], [address] Phone: [phone]   Fax:  [phone]  Physical Therapy Treatment  Patient Details  Name: Nicole Ruiz MRN: [id number] Date of Birth: 02/23/1941 Referring Provider: Bernerd Limbo, MD    Encounter Date: 02/22/2018  PT End of Session - 02/22/18 1443    Visit Number  4    Number of Visits  16    Date for PT Re-Evaluation  03/28/18    Authorization Type  [employer]     Authorization Time Period  01/31/18 to 03/28/18    PT Start Time  1604    PT Stop Time  1643    PT Time Calculation (min)  39 min    Activity Tolerance  No increased pain;Patient tolerated treatment well    Behavior During Therapy  [facility] for tasks assessed/performed       Past Medical History:  Diagnosis Date  . 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 Chronic kidney disease   . Depression   . Hyperlipidemia   . Hypertension   . Stroke [facility])     Past Surgical History:  Procedure Laterality Date  . ABDOMINAL HYSTERECTOMY    . ARM SURGERY    . CARDIAC CATHETERIZATION  2013   negative  . EP IMPLANTABLE DEVICE N/A 10/17/2015   Procedure: Loop Recorder Insertion;  Surgeon: Will Meredith Leeds, MD;  Location: [place] [facility];  Service: Cardiovascular;  Laterality: N/A;  . EYE SURGERY    . FEMUR IM NAIL Right 10/20/2015   Procedure: INTRAMEDULLARY (IM) RETROGRADE FEMORAL NAILING;  Surgeon: Tania Ade, MD;  Location: [place];  Service: Orthopedics;  Laterality: Right;  . HEMORROIDECTOMY    . KNEE SURGERY    . TEE WITHOUT CARDIOVERSION N/A 10/17/2015   Procedure: TRANSESOPHAGEAL ECHOCARDIOGRAM (TEE);  Surgeon: Jerline Pain, MD;  Location: [facility] ENDOSCOPY;  Service: Cardiovascular;  Laterality: N/A;    There were no vitals filed for this visit.  Subjective Assessment - 02/22/18 1409    Subjective  Pt arrives without any complaints. Her exercises are going good.     Pertinent History   stroke 2018, genitourinary cancer, Rt femur fracture with IM nail placement 10/20/15    Patient Stated Goals  improve independence and be able to get out in the community    Currently in Pain?  No/denies             [facility] Adult PT Treatment/Exercise - 02/22/18 0001      Knee/Hip Exercises: Seated   Sit to Sand  10 reps;with UE support;2 sets      Knee/Hip Exercises: Supine   Bridges  Both;1 set;15 reps;Limitations    Bridges Limitations  red TB around knees     Bridges with [employer]  Both;1 set;15 reps    Straight Leg Raises  Both;2 sets;10 reps;Limitations    Straight Leg Raises Limitations  RLE AAROM      Knee/Hip Exercises: Sidelying   Hip ABduction  Both;2 sets;10 reps;Strengthening;Limitations    Hip ABduction Limitations  verbal cues to decrease           Balance Exercises - 02/22/18 1430      Balance Exercises: Standing   Standing Eyes Closed  Narrow base of support (BOS);3 reps;30 secs;Solid surface    Tandem Stance  Eyes open;1 rep;30 secs;Foam/compliant surface SpO2 dropped to 70s with anxiety    Other Standing Exercises  on foam: weight shift Lt/Rt x30  sec, forward/back with each LE forward x30 sec each         PT Education - 02/22/18 1443    Education provided  Yes    Education Details  technique with therex     Person(s) Educated  Patient    Methods  Explanation;Verbal cues    Comprehension  Verbalized understanding;Returned demonstration       PT Short Term Goals - 02/15/18 1542      PT SHORT TERM GOAL #1   Title  Pt will demo consistency and independence with her HEP to improve LE strength.     Time  2    Period  Weeks    Status  Achieved      PT SHORT TERM GOAL #2   Title  Pt will utilize her RW during ambulation at home and in the community to increase her safety and decrease risk of falling during the initial weeks of her POC.     Time  4    Period  Weeks    Status  Not Met        PT Long Term Goals - 01/31/18 1801      PT  LONG TERM GOAL #1   Title  Pt will demo improved BLE strength to atleast 4/5 MMT which will increase her safety and efficiency with daily activity.     Time  8    Period  Weeks    Status  New    Target Date  03/28/18      PT LONG TERM GOAL #2   Title  Pt will complete 5x sit to stand in less than 14 sec with UE support, to demonstrate improvements in functional strength and power of the LEs.     Time  8    Period  Weeks    Status  New      PT LONG TERM GOAL #3   Title  Pt will demo atleast 9 pts improvement on the Berg Balance test to reflect a decrease in her risk of falling.     Time  8    Period  Weeks    Status  New      PT LONG TERM GOAL #4   Title  Pt will complete the TUG with LRAD in less than 20 sec to demonstrate a significant improvement in her functional balance and mobility.     Time  8    Period  Weeks    Status  New      PT LONG TERM GOAL #5   Title  Pt will be able to ascend and descend the clinic steps x2 trials with handrails and reciprocal pattern without supervision, to allow her to safely get to the den in her home without the need for caregiver assistance.     Time  8    Period  Weeks    Status  New            Plan - 02/22/18 1444    Clinical Impression Statement  Pt continues to demonstrate LE weakness and difficulty with static balance actiivty. Pt requires increased assistance with LLE strengthening, specifically with straight leg raises. She was able to complete proper breathing during balance activity this session, however she became increasingly anxious with a near LOB in tandem. Pt would continue to benefit from skilled PT to address limitations in strength, endurance and proprioception.     Rehab Potential  Good    PT Frequency  2x / week  PT Duration  8 weeks    PT Treatment/Interventions  ADLs/Self Care Home Management;Moist Heat;Balance training;Therapeutic exercise;Functional mobility training;Stair training;Gait training;Neuromuscular  re-education;Patient/family education;Manual techniques;Passive range of motion;Taping;Vestibular    PT Next Visit Plan  progress LE strength and balance (unsteady surface)    Consulted and Agree with Plan of Care  Patient       Patient will benefit from skilled therapeutic intervention in order to improve the following deficits and impairments:  Abnormal gait, Decreased balance, Difficulty walking, Decreased strength, Decreased endurance, Decreased activity tolerance, Decreased mobility, Pain, Improper body mechanics, Dizziness  Visit Diagnosis: Unsteadiness on feet  Muscle weakness (generalized)  History of falling  Dizziness and giddiness     Problem List Patient Active Problem List   Diagnosis Date Noted  . At risk for falling 12/20/2015  . Cerebrovascular accident, 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  . Femur fracture, right ([place]) 10/20/2015  . Fracture of distal femur, right, closed, with routine healing, subsequent encounter 10/20/2015  . Fracture of distal end of femur ([place]) 10/20/2015  . Fall 10/19/2015  . Dyslipidemia 10/19/2015  . Nodule of right lung 10/19/2015  . Lung mass 10/19/2015  . CVA (cerebral infarction) 10/15/2015  . Left middle cerebral artery stroke ([place]) 10/15/2015  . Hyperlipidemia 10/15/2015  . Stroke (cerebrum) ([place]) 10/15/2015  . Cerebral infarction ([place]) 10/15/2015  . 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[place]) 10/15/2015  . Cerebrovascular accident (CVA) ([place]) 10/15/2015  . Episode of syncope 07/17/2015  . Arthralgia of hip 07/09/2015  . Idiopathic insomnia 07/09/2015  . Arthralgia of lower leg 04/06/2014  . Unspecified arthropathy, lower leg 12/18/2013  . Acute posthemorrhagic anemia 12/18/2013  . Esophageal reflux 12/18/2013  . Acid reflux 12/18/2013  . Abnormal finding on radiology exam 09/19/2013  . Acquired cyst of kidney 09/19/2013  . Chronic kidney disease (CKD), stage III (moderate) ([place]) 09/19/2013  . Clinical depression  09/19/2013  . Bone/cartilage disorder 09/19/2013  . Arthritis of knee, degenerative 09/19/2013  . Avitaminosis D 09/19/2013  . Hypertension 05/29/2012  . Closed fracture of metatarsal bone 12/18/2011  . HYPERLIPIDEMIA 03/06/2009  . DIZZINESS 03/06/2009  . Palpitations 03/06/2009  . CHEST PAIN 03/06/2009    3:15 PM,02/22/18 Sherol Dade PT, DPT [place] [facility] [place] [address] [place], [place], [address] Phone: [phone]   Fax:  [phone]  Name: DENIQUA PERRY MRN: [id number] Date of Birth: November 27, 1941

## 2018-02-25 ENCOUNTER — Telehealth: Payer: Self-pay

## 2018-03-01 ENCOUNTER — Encounter: Payer: Self-pay | Admitting: Physical Therapy

## 2018-03-01 ENCOUNTER — Ambulatory Visit: Payer: Medicare HMO | Attending: Family Medicine | Admitting: Physical Therapy

## 2018-03-01 DIAGNOSIS — R2681 Unsteadiness on feet: Secondary | ICD-10-CM | POA: Diagnosis not present

## 2018-03-01 DIAGNOSIS — M6281 Muscle weakness (generalized): Secondary | ICD-10-CM | POA: Insufficient documentation

## 2018-03-01 DIAGNOSIS — Z9181 History of falling: Secondary | ICD-10-CM | POA: Diagnosis present

## 2018-03-01 DIAGNOSIS — R42 Dizziness and giddiness: Secondary | ICD-10-CM | POA: Insufficient documentation

## 2018-03-01 NOTE — Therapy (Addendum)
[facility] [address], [place], [place], [address] Phone: [phone]   Fax:  [phone]  Physical Therapy Treatment/Discharge  Patient Details  Name: Nicole Ruiz MRN: [id number] Date of Birth: [birth year] Referring Provider: Bernerd Limbo, MD    Encounter Date: 03/01/2018  PT End of Session - 03/01/18 1421    Visit Number  5    Number of Visits  16    Date for PT Re-Evaluation  03/28/18    Authorization Type  [employer]     Authorization Time Period  01/31/18 to 03/28/18    PT Start Time  1400    PT Stop Time  1440 10 min untimed due to pt report of increased dizziness    PT Time Calculation (min)  40 min    Activity Tolerance  No increased pain;Patient tolerated treatment well    Behavior During Therapy  [facility] for tasks assessed/performed       Past Medical History:  Diagnosis Date  . 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 Chronic kidney disease   . Depression   . Hyperlipidemia   . Hypertension   . Stroke [facility])     Past Surgical History:  Procedure Laterality Date  . ABDOMINAL HYSTERECTOMY    . ARM SURGERY    . CARDIAC CATHETERIZATION  2013   negative  . EP IMPLANTABLE DEVICE N/A 10/17/2015   Procedure: Loop Recorder Insertion;  Surgeon: Will Meredith Leeds, MD;  Location: [place] [facility];  Service: Cardiovascular;  Laterality: N/A;  . EYE SURGERY    . FEMUR IM NAIL Right 10/20/2015   Procedure: INTRAMEDULLARY (IM) RETROGRADE FEMORAL NAILING;  Surgeon: Tania Ade, MD;  Location: [place];  Service: Orthopedics;  Laterality: Right;  . HEMORROIDECTOMY    . KNEE SURGERY    . TEE WITHOUT CARDIOVERSION N/A 10/17/2015   Procedure: TRANSESOPHAGEAL ECHOCARDIOGRAM (TEE);  Surgeon: Jerline Pain, MD;  Location: [facility] ENDOSCOPY;  Service: Cardiovascular;  Laterality: N/A;    There were no vitals filed for this visit.  Subjective Assessment - 03/01/18 1406    Subjective  Pt reports that she is  feeling dizzy today. She notes this started yesterday. She denies any other symptoms.     Pertinent History  stroke 2018, genitourinary cancer, Rt femur fracture with IM nail placement 10/20/15    Patient Stated Goals  improve independence and be able to get out in the community    Currently in Pain?  No/denies         [facility] PT Assessment - 03/01/18 0001      Observation/Other Assessments   Observations  BP: 139/88 mmHg prior to start of session                  [facility] PT Treatment/Exercise - 03/01/18 0001      Knee/Hip Exercises: Aerobic   Elliptical  L1 x5 min, PT present to discuss session      Knee/Hip Exercises: Seated   Marching  2 sets;10 reps;Weights    Marching Limitations  3# ankle weights     Sit to Sand  2 sets;without UE support          Balance Exercises - 03/01/18 1427      Balance Exercises: Standing   Standing Eyes Closed  Narrow base of support (BOS);2 reps;30 secs    Tandem Stance  Eyes open;2 reps;30 secs;Other (comment) SpO2 remained in 90s, but dropped to 70s following  Other Standing Exercises  Side stepping Lt/Rt x10 reps each, CGA        PT Education - 03/01/18 1420    Education provided  Yes    Education Details  discussed importance of notifying PCP if pt continues to have issues with dizziness    Person(s) Educated  Patient    Methods  Explanation    Comprehension  Verbalized understanding       PT Short Term Goals - 02/15/18 1542      PT SHORT TERM GOAL #1   Title  Pt will demo consistency and independence with her HEP to improve LE strength.     Time  2    Period  Weeks    Status  Achieved      PT SHORT TERM GOAL #2   Title  Pt will utilize her RW during ambulation at home and in the community to increase her safety and decrease risk of falling during the initial weeks of her POC.     Time  4    Period  Weeks    Status  Not Met        PT Long Term Goals - 01/31/18 1801      PT LONG TERM GOAL #1    Title  Pt will demo improved BLE strength to atleast 4/5 MMT which will increase her safety and efficiency with daily activity.     Time  8    Period  Weeks    Status  New    Target Date  03/28/18      PT LONG TERM GOAL #2   Title  Pt will complete 5x sit to stand in less than 14 sec with UE support, to demonstrate improvements in functional strength and power of the LEs.     Time  8    Period  Weeks    Status  New      PT LONG TERM GOAL #3   Title  Pt will demo atleast 9 pts improvement on the Berg Balance test to reflect a decrease in her risk of falling.     Time  8    Period  Weeks    Status  New      PT LONG TERM GOAL #4   Title  Pt will complete the TUG with LRAD in less than 20 sec to demonstrate a significant improvement in her functional balance and mobility.     Time  8    Period  Weeks    Status  New      PT LONG TERM GOAL #5   Title  Pt will be able to ascend and descend the clinic steps x2 trials with handrails and reciprocal pattern without supervision, to allow her to safely get to the den in her home without the need for caregiver assistance.     Time  8    Period  Weeks    Status  New            Plan - 03/01/18 1513    Clinical Impression Statement  Pt arrived with reports of increased dizziness. Pt's BP was 139/90 mmHg, and she denied any other symptoms at this time. Pt's oxygen levels continue to drop during her sessions into the low-mid 70s, but this is improved with therapist encouragement to take deep breaths. Pt could benefit from supplemental oxygen and was encouraged to speak with her PCP regarding further evaluation of this. Closely monitored pt's vitals during today's session and   provided frequent rest breaks to ensure oxygen levels remained above 90%. She would continue to benefit from progressions of LE strength, proprioception and breathing education.    Rehab Potential  Good    PT Frequency  2x / week    PT Duration  8 weeks    PT  Treatment/Interventions  ADLs/Self Care Home Management;Moist Heat;Balance training;Therapeutic exercise;Functional mobility training;Stair training;Gait training;Neuromuscular re-education;Patient/family education;Manual techniques;Passive range of motion;Taping;Vestibular    PT Next Visit Plan  f/u with possible need for supplemental oxygen    Consulted and Agree with Plan of Care  Patient       Patient will benefit from skilled therapeutic intervention in order to improve the following deficits and impairments:  Abnormal gait, Decreased balance, Difficulty walking, Decreased strength, Decreased endurance, Decreased activity tolerance, Decreased mobility, Pain, Improper body mechanics, Dizziness  Visit Diagnosis: Unsteadiness on feet  Muscle weakness (generalized)  History of falling  Dizziness and giddiness     Problem List Patient Active Problem List   Diagnosis Date Noted  . At risk for falling 12/20/2015  . Cerebrovascular accident, 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  . Femur fracture, right ([place]) 10/20/2015  . Fracture of distal femur, right, closed, with routine healing, subsequent encounter 10/20/2015  . Fracture of distal end of femur ([place]) 10/20/2015  . Fall 10/19/2015  . Dyslipidemia 10/19/2015  . Nodule of right lung 10/19/2015  . Lung mass 10/19/2015  . CVA (cerebral infarction) 10/15/2015  . Left middle cerebral artery stroke ([place]) 10/15/2015  . Hyperlipidemia 10/15/2015  . Stroke (cerebrum) ([place]) 10/15/2015  . Cerebral infarction ([place]) 10/15/2015  . 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[place]) 10/15/2015  . Cerebrovascular accident (CVA) ([place]) 10/15/2015  . Episode of syncope 07/17/2015  . Arthralgia of hip 07/09/2015  . Idiopathic insomnia 07/09/2015  . Arthralgia of lower leg 04/06/2014  . Unspecified arthropathy, lower leg 12/18/2013  . Acute posthemorrhagic anemia 12/18/2013  . Esophageal reflux 12/18/2013  . Acid reflux 12/18/2013  . Abnormal  finding on radiology exam 09/19/2013  . Acquired cyst of kidney 09/19/2013  . Chronic kidney disease (CKD), stage III (moderate) ([place]) 09/19/2013  . Clinical depression 09/19/2013  . Bone/cartilage disorder 09/19/2013  . Arthritis of knee, degenerative 09/19/2013  . Avitaminosis D 09/19/2013  . Hypertension 05/29/2012  . Closed fracture of metatarsal bone 12/18/2011  . HYPERLIPIDEMIA 03/06/2009  . DIZZINESS 03/06/2009  . Palpitations 03/06/2009  . CHEST PAIN 03/06/2009    3:18 PM,03/01/18 Sherol Dade PT, DPT [facility] [place]  [facility] [address], [place], [place], [address] Phone: [phone]   Fax:  [phone]  Name: TYESHA JOFFE MRN: [id number] Date of Birth: 12/06/1941     PHYSICAL THERAPY DISCHARGE SUMMARY  Visits from Start of Care: 5  Current functional level related to goals / functional outcomes: See above   Remaining deficits: Unknown, pt did not return   Education / Equipment: HEP  Plan: Patient agrees to discharge.  Patient goals were not met. Patient is being discharged due to not returning since the last visit.  ?????     Laureen Abrahams, PT, DPT 04/05/18 2:16 PM  [facility] [place], [place] [address]  [phone] (office) [phone] (fax)

## 2018-03-10 ENCOUNTER — Encounter: Payer: Self-pay | Admitting: Neurology

## 2018-03-10 ENCOUNTER — Ambulatory Visit: Payer: Medicare HMO | Admitting: Neurology

## 2018-03-10 ENCOUNTER — Telehealth: Payer: Self-pay | Admitting: Neurology

## 2018-03-10 VITALS — BP 110/72 | HR 76 | Wt 109.2 lb

## 2018-03-10 DIAGNOSIS — R251 Tremor, unspecified: Secondary | ICD-10-CM

## 2018-03-10 MED ORDER — ALPRAZOLAM 0.25 MG PO TABS: 0.2500 mg | ORAL_TABLET | Freq: Two times a day (BID) | ORAL | 0 refills | Status: DC | PRN

## 2018-03-10 NOTE — Patient Instructions (Signed)
I had a long discussion with the patient and her husband regarding her multifocal symptoms of tremulousness, blurred vision, diplopia and speech difficulties breathing of unclear etiology. Possibilities include anxiety versus vertebrobasilar ischemia. I recommend she try Xanax 0.25 mg in the morning and may repeat a second dose if needed to see if it helps. Check MRI scan of the brain with MRA of the brain and neck, lipid profile, albumin A1c, TSH and B12. Continue aspirin for stroke prevention for strict control of lipids with LDL cholesterol goal below 70 mg percent. She will return for follow-up in 2 months or call earlier if necessary

## 2018-03-10 NOTE — Telephone Encounter (Signed)
Mcarthur Rossetti Josem Kaufmann: [phone] (exp. 03/10/18 to 04/09/18) order sent to [facility] they will reach out to the patient to schedule.

## 2018-03-10 NOTE — Progress Notes (Signed)
[facility] [address] [place]. [place] [address] [phone]       OFFICE CONSULT NOTE  Ms. LASHEBA STEVENS Date of Birth:  1941-12-05 Medical Record Number:  [id number]   Referring MD: Bernerd Limbo  Reason for Referral: tremors HPI: Ms Dinius is a 6 year pleasant Caucasian lady whose seen today on consultation complaint by her husband. History is obtained from them as well as review of referral notes. Patient states for the last 3 months or so she's noticed increasing sensation of tremulousness. She describes this first noticed when she gets up in the morning she feels her body shaking from inside as well as her hands and feet. This may fluctuate but is present on most days. This tends to get better later on in the day when she is up and around on her feet and he is usually not present at night when she is sleeping. She denies any dropping of objects in her hands are diminished muscle strength or fine motor skills. She denies any drooling of saliva, bradykinesia or difficulty wearing her clothes. She does have prior history of some anxiety and mood disorder and does take Celexa 20 mg daily. She has not tried taking Klonopin on Xanax. She states that she had a stroke in October 2016 following hip surgery and the etiology of the stroke was felt to be cryptogenic and 9 fact saw her at that time. She had an extensive workup including TEE and loop recorder and no specific source of embolism was found. Since her stroke she has noticed that she has some pain and discomfort in the right hip and knee were shaking a little bit but this seems to have gotten worse in the last 2-3 months or so. She is also had some mild short-term memory difficulties since her stroke which is static and are not progressive.Patient denies any orthostatic symptoms but she does state that during some of these episodes when she is getting up she nearly passes out and feels her vision is blurred called  tunnellike at times she may have some speech difficulties and on occasion she also sees double. She denies any vertigo, focal extremity weakness or numbness.review of her loop recorder in the electroniic  medical chart and until February 2019 had not shown evidence of atral fibrillation Prior to visit  12/03/15 : she presented in October 2016  With sudden onset ofdysarthria, right sided weakness on 10/13/15 initially. She reported that she went to bed Sunday night feeling well but woke around around 1 am Monday and realized that the right arm and leg were not moving well, weak, and she couldn't go anywhere without holding to things around her room. Then, she became aware of having trouble expressing herself. Overall. Her symptoms have remained unchanged. Complains of HA, but denies vertigo, double vision, difficulty swallowing, focal weakness, or vision disturbances. CT brain showed no acute abnormality. MRI revealed a moderate size infarct along the insular region, posterior frontal region, as well as smaller areas of acute infarct posterior left parietal area and corona radiata. MRA brain: no large vessel intracranial occlusion. Marked attenuation of left MCA M2 branch vessels/ subtotal occlusion of left M2 trunks with occlusion of more distal branch vessels. Patient was last known well 10/13/15 at 9:30 pm. Patient was not administered TPA secondary to delay in arrival. She was admitted for further evaluation and treatment. Carotid Doppler showed no significant extracranial stenosis. MRA of the brain showed marked attenuation of the left MCA M2  branches with the subtotal occlusion of the left M2 trunk and distal branches but no large vessel proximal occlusion. Transthoracic echo showed normal ejection fraction. TEE showed no definite source of embolism but showed a patent foramen ovale but lower extremity venous Dopplers were negative for DVT and patient had a loop recorder inserted. LDL cholesterol was  slightly elevated at 85 mg percent and hemoglobin A1c was 5.9. Patient was started on aspirin and initially on Zocor and did well and was discharged home but came back a week later with a fall at home and was noted as having fractured femur on the right. Repeat MRI showed extension of the left MCA frontal infarcts which may have controverted to her weakness and fall. Interestingly loop recorder interrogation did not reveal atrial fibrillation even though the patient described what looked like a near syncopal episode leading to a fall. She stated that she had a similar episode of dizziness and nearly passing out prior to her initial stroke admission as well. She had an EEG done in the hospital which did not show any definite seizure activity and was normal. ROS:   14 system review of systems is positive for  Weight loss, fatigue, chest pain, palpitations, hearing loss, ringing in the ears, spinning sensation, trouble swallowing, itching, [place], blurred vision, double vision, constipation, easy bruising, feeling hot, joint pain, cramps, aching muscles, runny nose, memory loss, confusion, headache, numbness, weakness, slurred speech, dizziness, passing out, restless legs and all other symptoms negative  PMH:  Past Medical History:  Diagnosis Date  . 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 Chronic kidney disease   . Depression   . Hyperlipidemia   . Hypertension   . Stroke [facility])     Social History:  Social History   Socioeconomic History  . Marital status: Married    Spouse name: Not on file  . Number of children: Not on file  . Years of education: Not on file  . Highest education level: Not on file  Social Needs  . Financial resource strain: Not on file  . Food insecurity - worry: Not on file  . Food insecurity - inability: Not on file  . Transportation needs - medical: Not on file  . Transportation needs - non-medical: Not on file  Occupational History  . Not on file  Tobacco  Use  . Smoking status: Never Smoker  . Smokeless tobacco: Never Used  Substance and Sexual Activity  . Alcohol use: No  . Drug use: No  . Sexual activity: No  Other Topics Concern  . Not on file  Social History Narrative   Married.    Medications:   Current Outpatient Medications on File Prior to Visit  Medication Sig Dispense Refill  . aspirin EC 81 MG tablet Take by mouth.    [name] atorvastatin (LIPITOR) 40 MG tablet Take 1 tablet (40 mg total) by mouth daily. (Patient taking differently: Take 40 mg by mouth at bedtime. ) 90 tablet 0  . Calcium Carb-Ergocalciferol 500-200 MG-UNIT TABS Take by mouth.    . Cholecalciferol (VITAMIN D3) 1000 units CAPS Take by mouth.    . Cyanocobalamin (VITAMIN B 12 PO) Take 1,000 mg by mouth.    [name] FLUoxetine (PROZAC) 20 MG capsule Take by mouth.    [name] omeprazole (PRILOSEC OTC) 20 MG tablet Take 20 mg by mouth daily.    . polyvinyl alcohol (ARTIFICIAL TEARS) 1.4 % ophthalmic solution Place 1 drop into both eyes every hour as needed  for dry eyes.    . traMADol (ULTRAM) 50 MG tablet Take by mouth.    [name] UNABLE TO FIND Mega red take one daily     No current facility-administered medications on file prior to visit.     Allergies:  No Known Allergies  Physical Exam General: frail elderly Caucasian lady seated, in no evident distress Head: head normocephalic and atraumatic.   Neck: supple with no carotid or supraclavicular bruits Cardiovascular: regular rate and rhythm, no murmurs Musculoskeletal: no deformity Skin:  no rash/petichiae Vascular:  Normal pulses all extremities  Neurologic Exam Mental Status: Awake and fully alert. Oriented to place and time. Recent and remote memory intact. Attention span, concentration and fund of knowledge appropriate. Mood and affect appropriate. Anxious appearing Cranial Nerves: Fundoscopic exam reveals sharp disc margins. Pupils equal, briskly reactive to light. Extraocular movements full without nystagmus. Visual  fields full to confrontation. Hearing intact. Facial sensation intact. Face, tongue, palate moves normally and symmetrically.  Motor: Normal bulk and tone. Normal strength in all tested extremity muscles.mild action tremor of outstretched upper extremities left greater than right. No tremor of the lower extremities had a b. No cogwheel rigidity, bradykinesia Sensory.: intact to touch , pinprick , position and vibratory sensation.  Coordination: Rapid alternating movements normal in all extremities. Finger-to-nose and heel-to-shin performed accurately bilaterally. Gait and Station: Arises from chair without difficulty. Stance is normal. Gait demonstrates normal stride length and balance .mild kyphosis but no festination or retropulsion. Able to heel, toe and tandem walk without difficulty.  Reflexes: 1+ and symmetric. Toes downgoing.      ASSESSMENT: 75 year Caucasian lady with 3 month history of extremity and body tremulousness with multifocal symptoms of blurred vision, nearly passing out and speech difficulties upon arising of unclear etiology. 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 Possibilities include underlying anxiety/stress versus vertebrobasilar ischemia.    PLAN: I had a long discussion with the patient and her husband regarding her multifocal symptoms of tremulousness, blurred vision, diplopia and speech difficulties breathing of unclear etiology. Possibilities include anxiety versus vertebrobasilar ischemia. I recommend she try Xanax 0.25 mg in the morning and may repeat a second dose if needed to see if it helps. Check MRI scan of the brain with MRA of the brain and neck, lipid profile, albumin A1c, TSH and B12. Continue aspirin for stroke prevention for strict control of lipids with LDL cholesterol goal below 70 mg percent.Greater than 50% time during this 45 minute consultation visit was spent on counseling and coordination of care about her tremors  and previous stroke and answering questions She will return for follow-up in 2 months or call earlier if necessary Antony Contras, MD  [facility] [address] [place], [place] [address]  Phone [phone] Fax [phone] Note: This document was prepared with digital dictation and possible smart phrase technology. Any transcriptional errors that result from this process are unintentional.

## 2018-03-11 LAB — HEMOGLOBIN A1C
ESTIMATED AVERAGE GLUCOSE: 114 mg/dL
HEMOGLOBIN A1C: 5.6 % (ref 4.8–5.6)

## 2018-03-11 LAB — LIPID PANEL
CHOLESTEROL TOTAL: 167 mg/dL (ref 100–199)
Chol/HDL Ratio: 2 ratio (ref 0.0–4.4)
HDL: 82 mg/dL (ref >39)
LDL CALC: 70 mg/dL (ref 0–99)
Triglycerides: 74 mg/dL (ref 0–149)
VLDL CHOLESTEROL CAL: 15 mg/dL (ref 5–40)

## 2018-03-11 LAB — VITAMIN B12: Vitamin B-12: 343 pg/mL (ref 232–1245)

## 2018-03-11 LAB — TSH: TSH: 7.92 u[IU]/mL — AB (ref 0.450–4.500)

## 2018-03-14 ENCOUNTER — Telehealth: Payer: Self-pay | Admitting: *Deleted

## 2018-03-14 NOTE — Telephone Encounter (Signed)
Rn call Nicole Ruiz at [facility] but was at lunch. Rn spoke with Nicole Ruiz at [facility]. Nicole Ruiz stated they need the the date it was put in the serial number. Rn stated all of our pts who have loop recorders always get MRI. Rn stated there has not been a call from [place] until today wanting to know the number, and type..RN stated the loop recorders are put in by the hospital or out patient setting with the cardiology office. Nicole Ruiz stated she will tell Nicole Ruiz when she returns from lunch.

## 2018-03-16 ENCOUNTER — Telehealth: Payer: Self-pay

## 2018-03-16 NOTE — Telephone Encounter (Signed)
-----   Message from Garvin Fila, MD sent at 03/15/2018  4:32 PM EDT ----- And informed the patient that blood work for lipids, diabetes and vitamin B12 were normal but thyroid hormone is elevated and she needs to see her primary physician Dr. Bernerd Limbo t to get medical treatment for hyothyroid state

## 2018-03-16 NOTE — Telephone Encounter (Signed)
Rn call patient about her lab work results. Rn stated all her lab work was normal. Her thyroid level was elevated,and she needs to see her primary doctor for evaluation.RN advised pt to call her PCP tomorrow to schedule an appointment within the next 2 to 3 weeks to discuss her elevated thyroid levels. Rn stated the labs will be fax to her PCP. Labs were fax and receive. Pt verbalized understanding. ------

## 2018-03-24 ENCOUNTER — Ambulatory Visit
Admission: RE | Admit: 2018-03-24 | Discharge: 2018-03-24 | Disposition: A | Discharge status: Home / Self Care | Payer: Medicare HMO | Source: Ambulatory Visit | Attending: Neurology | Admitting: Neurology

## 2018-03-24 ENCOUNTER — Encounter: Payer: Self-pay | Admitting: Radiology

## 2018-03-24 DIAGNOSIS — R251 Tremor, unspecified: Secondary | ICD-10-CM | POA: Diagnosis not present

## 2018-03-24 MED ORDER — GADOBENATE DIMEGLUMINE 529 MG/ML IV SOLN
5.0000 mL | Freq: Once | INTRAVENOUS | Status: AC | PRN
  Administered 2018-03-24: 5 mL via INTRAVENOUS

## 2018-04-06 ENCOUNTER — Telehealth: Payer: Self-pay

## 2018-04-06 NOTE — Telephone Encounter (Signed)
-----   Message from Garvin Fila, MD sent at 04/06/2018 12:14 PM EDT ----- Kindly call patient and let her know that MRI brain showed several old brain strokes but no new or worrisome findings.No change compared to prior scan. MRA neck shows no major stenosis and MRA brain shows mild narrowing of vessels on left in area of old stroke. No new or worrisome findings

## 2018-04-06 NOTE — Telephone Encounter (Signed)
Notes recorded by Marval Regal, RN on 04/06/2018 at 5:31 PM EDT Rn call patient about her three images. MRi brain, MRA neck,and MRA head. Rn stated per DR Leonie Man note. MRI brain showed several old brain strokes but no new or worrisome findings.No change compared to prior scan. MRA neck shows no major stenosis and MRA head shows mild narrowing of vessels on left in area of old stroke. No new or worrisome findings Pt verbalized understanding. ------

## 2018-04-13 ENCOUNTER — Telehealth: Payer: Self-pay | Admitting: Neurology

## 2018-04-13 NOTE — Telephone Encounter (Signed)
Pt request refill for ALPRAZolam (XANAX) 0.25 MG tablet sent to [facility]. Pt said she took the last one today. Pt said it is helping the shaking a little bit. Pt said she takes this and a pain pill a bedtime to sleep. Pt is aware Dr Leonie Man is at the hospital and this will be attended to tomorrow.

## 2018-04-13 NOTE — Telephone Encounter (Signed)
Dr.Sethi see phone note below. Pt was given 60 pills on 03/10/2018 at last office note. She states its helping her shaking a little bit. Pt was given 60 pills and is asking for a new refill. The order is prn but she is taking it everyday. Please advised thanks

## 2018-04-14 ENCOUNTER — Other Ambulatory Visit: Payer: Self-pay | Admitting: Neurology

## 2018-04-14 NOTE — Telephone Encounter (Signed)
Ok for 1 refill but see NP prior to any other refills

## 2018-04-15 ENCOUNTER — Other Ambulatory Visit: Payer: Self-pay | Admitting: Neurology

## 2018-04-15 NOTE — Telephone Encounter (Signed)
I returned patient's call to [facility] answering service requesting prescription refill for Xanax. I called the patient and spoke to her. I explained to her that I was working in the hospital this week and was unable to sign a prescription for Xanax since that has to be picked up from the office which is unfortunately closed at the moment. I suggested she pick up a prescription for a few days from her primary care physician today if possible and to pick up the prescription for 30 days of Xanax which I have authorized from my office next week. She voiced understanding.she was advised to make an appointment to be seen in the office within the next 30 days.

## 2018-04-18 ENCOUNTER — Other Ambulatory Visit: Payer: Self-pay

## 2018-04-18 MED ORDER — ALPRAZOLAM 0.25 MG PO TABS: 0.2500 mg | ORAL_TABLET | Freq: Two times a day (BID) | ORAL | 0 refills | Status: DC | PRN

## 2018-04-18 NOTE — Telephone Encounter (Signed)
Rn call patient that her xanax was fax to [facility] on [address]. Rn stated per Dr. Leonie Man he wants her to be seen sooner than June appt she has. He wants pt to be evaluated for another medication other than xanax if its not helping her tremors. Rn stated she will be seeing Janett Billow NP. Pt schedule earlier appt with Janett Billow NP on May 21 at 215pm. Pt verbalized appt time,and understand the reevaluation for xanax.

## 2018-04-18 NOTE — Telephone Encounter (Signed)
Rx of Xanax fax to [facility] at [address]. Rx fax twice and confirmed.

## 2018-04-18 NOTE — Telephone Encounter (Signed)
Rn spoke with Dr. Zola Button NP is aware pt needs to be seen before this rx runs out. PT needs to be evaluate ongoing per Dr. Leonie Man if this helping her tremors or another medical issue. Pt will be call for an appt.

## 2018-04-18 NOTE — Telephone Encounter (Signed)
Garvin Fila, MD 3 days ago      I returned patient's call to [facility] answering service requesting prescription refill for Xanax. I called the patient and spoke to her. I explained to her that I was working in the hospital this week and was unable to sign a prescription for Xanax since that has to be picked up from the office which is unfortunately closed at the moment. I suggested she pick up a prescription for a few days from her primary care physician today if possible and to pick up the prescription for 30 days of Xanax which I have authorized from my office next week. She voiced understanding.she was advised to make an appointment to be seen in the office within the next 30 days.      Documentation

## 2018-05-17 ENCOUNTER — Encounter: Payer: Self-pay | Admitting: Adult Health

## 2018-05-17 ENCOUNTER — Ambulatory Visit: Payer: Medicare HMO | Admitting: Adult Health

## 2018-05-17 VITALS — BP 119/62 | HR 52 | Ht 64.0 in | Wt 114.0 lb

## 2018-05-17 DIAGNOSIS — R251 Tremor, unspecified: Secondary | ICD-10-CM

## 2018-05-17 DIAGNOSIS — Z8673 Personal history of transient ischemic attack (TIA), and cerebral infarction without residual deficits: Secondary | ICD-10-CM

## 2018-05-17 MED ORDER — ALPRAZOLAM 0.25 MG PO TABS: 0.2500 mg | ORAL_TABLET | Freq: Two times a day (BID) | ORAL | 4 refills | Status: DC | PRN

## 2018-05-17 MED ORDER — FLUOXETINE HCL 40 MG PO CAPS: 40.0000 mg | ORAL_CAPSULE | Freq: Every day | ORAL | 3 refills | Status: DC

## 2018-05-17 NOTE — Patient Instructions (Addendum)
Your Plan:  Continue xanax 0.25mg  twice a day as needed  Increase prozac from 20mg  to 40mg   Continue aspirin and lipitor for secondary stroke prevention  Continue to stay active  Practice stress reduction exercises  Follow up with PCP regarding thyroid levels and possible medication management  Follow up in 3 months or call earlier if needed      Thank you for coming to see [place] at [facility]. I hope we have been able to provide you high quality care today.  You may receive a patient satisfaction survey over the next few weeks. We would appreciate your feedback and comments so that we may continue to improve ourselves and the health of our patients.    Mindfulness-Based Stress Reduction Mindfulness-based stress reduction (MBSR) is a program that helps people learn to practice mindfulness. Mindfulness is the practice of intentionally paying attention to the present moment. It can be learned and practiced through techniques such as education, breathing exercises, meditation, and yoga. MBSR includes several mindfulness techniques in one program. MBSR works best when you understand the treatment, are willing to try new things, and can commit to spending time practicing what you learn. MBSR training may include learning about:  How your emotions, thoughts, and reactions affect your body.  New ways to respond to things that cause negative thoughts to start (triggers).  How to notice your thoughts and let go of them.  Practicing awareness of everyday things that you normally do without thinking.  The techniques and goals of different types of meditation.  What are the benefits of MBSR? MBSR can have many benefits, which include helping you to:  Develop self-awareness. This refers to knowing and understanding yourself.  Learn skills and attitudes that help you to participate in your own health care.  Learn new ways to care for yourself.  Be more accepting  about how things are, and let things go.  Be less judgmental and approach things with an open mind.  Be patient with yourself and trust yourself more.  MBSR has also been shown to:  Reduce negative emotions, such as depression and anxiety.  Improve memory and focus.  Change how you sense and approach pain.  Boost your body's ability to fight infections.  Help you connect better with other people.  Improve your sense of well-being.  Follow these instructions at home:  Find a local in-person or online MBSR program.  Set aside some time regularly for mindfulness practice.  Find a mindfulness practice that works best for you. This may include one or more of the following: ? Meditation. Meditation involves focusing your mind on a certain thought or activity. ? Breathing awareness exercises. These help you to stay present by focusing on your breath. ? Body scan. For this practice, you lie down and pay attention to each part of your body from head to toe. You can identify tension and soreness and intentionally relax parts of your body. ? Yoga. Yoga involves stretching and breathing, and it can improve your ability to move and be flexible. It can also provide an experience of testing your body's limits, which can help you release stress. ? Mindful eating. This way of eating involves focusing on the taste, texture, color, and smell of each bite of food. Because this slows down eating and helps you feel full sooner, it can be an important part of a weight-loss plan.  Find a podcast or recording that provides guidance for breathing awareness, body scan, or meditation exercises.  You can listen to these any time when you have a free moment to rest without distractions.  Follow your treatment plan as told by your health care provider. This may include taking regular medicines and making changes to your diet or lifestyle as recommended. How to practice mindfulness To do a basic awareness  exercise:  Find a comfortable place to sit.  Pay attention to the present moment. Observe your thoughts, feelings, and surroundings just as they are.  Avoid placing judgment on yourself, your feelings, or your surroundings. Make note of any judgment that comes up, and let it go.  Your mind may wander, and that is okay. Make note of when your thoughts drift, and return your attention to the present moment.  To do basic mindfulness meditation:  Find a comfortable place to sit. This may include a stable chair or a firm floor cushion. ? Sit upright with your back straight. Let your arms fall next to your side with your hands resting on your legs. ? If sitting in a chair, rest your feet flat on the floor. ? If sitting on a cushion, cross your legs in front of you.  Keep your head in a neutral position with your chin dropped slightly. Relax your jaw and rest the tip of your tongue on the roof of your mouth. Drop your gaze to the floor. You can close your eyes if you like.  Breathe normally and pay attention to your breath. Feel the air moving in and out of your nose. Feel your belly expanding and relaxing with each breath.  Your mind may wander, and that is okay. Make note of when your thoughts drift, and return your attention to your breath.  Avoid placing judgment on yourself, your feelings, or your surroundings. Make note of any judgment or feelings that come up, let them go, and bring your attention back to your breath.  When you are ready, lift your gaze or open your eyes. Pay attention to how your body feels after the meditation.  Where to find more information: You can find more information about MBSR from:  Your health care provider.  Community-based meditation centers or programs.  Programs offered near you.  Summary  Mindfulness-based stress reduction (MBSR) is a program that teaches you how to intentionally pay attention to the present moment. It is used with other  treatments to help you cope better with daily stress, emotions, and pain.  MBSR focuses on developing self-awareness, which allows you to respond to life stress without judgment or negative emotions.  MBSR programs may involve learning different mindfulness practices, such as breathing exercises, meditation, yoga, body scan, or mindful eating. Find a mindfulness practice that works best for you, and set aside time for it on a regular basis. This information is not intended to replace advice given to you by your health care provider. Make sure you discuss any questions you have with your health care provider. Document Released: 04/22/2017 Document Revised: 04/22/2017 Document Reviewed: 04/22/2017 Elsevier Interactive Patient Education  [employer].

## 2018-05-17 NOTE — Progress Notes (Signed)
[facility] [address] [place]. [place] [address] [phone]       OFFICE FOLLOW UP NOTE  [name] Date of Birth:  10/05/1941 Medical Record Number:  [id number]   Referring MD: Bernerd Limbo  Reason for Referral: tremors HPI (03/10/18 PS): [name] is a 46 year pleasant Caucasian lady whose seen today on consultation complaint by her husband. History is obtained from them as well as review of referral notes. Patient states for the last 3 months or so she's noticed increasing sensation of tremulousness. She describes this first noticed when she gets up in the morning she feels her body shaking from inside as well as her hands and feet. This may fluctuate but is present on most days. This tends to get better later on in the day when she is up and around on her feet and he is usually not present at night when she is sleeping. She denies any dropping of objects in her hands are diminished muscle strength or fine motor skills. She denies any drooling of saliva, bradykinesia or difficulty wearing her clothes. She does have prior history of some anxiety and mood disorder and does take Celexa 20 mg daily. She has not tried taking Klonopin or Xanax. 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 She had an extensive workup including TEE and loop recorder and no specific source of embolism was found. Since her stroke she has noticed that she has some pain and discomfort in the right hip and knee were shaking a little bit but this seems to have gotten worse in the last 2-3 months or so. She is also had some mild short-term memory difficulties since her stroke which is static and are not progressive.Patient denies any orthostatic symptoms but she does state that during some of these episodes when she is getting up she nearly passes out and feels her vision is blurred  called tunnellike at times she may have some speech difficulties and on occasion she also sees double. She denies any vertigo, focal extremity weakness or numbness.review of her loop recorder in the electroniic  medical chart and until February 2019 had not shown evidence of atral fibrillation  05/17/18 UPDATE: MRI brain reviewed and showed multiple remote age small infarcts involving bilateral cerebellum, coronal radiata and left occipital cortex but otherwise no acute abnormalities. MRA neck negative for significant stenosis. MRA of head showed attenuation of distal M2 and M3 branches of the left MCA but no proximal LVO. Vitamin B, lipid panel and A1c all normal. TSH elevated and recommended to follow up with PCP.  Patient is being seen today for 11-month follow-up visit.  Per telephone notes, it appeared as though Xanax was only helping slightly but as patient was calling for a refill it was recommended she come into the office to be assessed.  During appointment, patient states that Xanax has been helping and that she takes 1 tablet 0.25 daily (currently prescribed for 1 tab twice daily).  She ran out recently for 2 to 3 days and her tremors did become worse.  She does use a cane or walker for years for ambulation as she has issues with dizziness but denies wanting physical therapy assistance as this did not help in the past.  Does states she falls about once every 2 to 3 weeks without injury but this  has been decreased lately she cannot remember the last time she fell. States when she falls she loses her balance and then "passes out". Also having continued speech difficulty - appears to be a "stuttering" type of speech.  Patient continues aspirin and Lipitor without side effects for secondary stroke prevention.  Patient also continues to be compliant with Prozac 20 mg for depression.  Blood pressure today satisfactory at 190/62.  Denied follow-up appointment with PCP in regards to elevated TSH.  Patient returns  for reevaluation.   ROS:   14 system review of systems is positive for chills, fatigue, hearing loss, ringing in ears, eye itching, light sensitivity, blurred vision, shortness of breath, chest pain, restless leg, daytime sleepiness, joint pain, back pain, aching muscles, muscle cramps, walking difficulty, neck pain, bruise/bleed easily, memory loss, dizziness, headache, speech difficulty, weakness, tremors, passing out, agitation, confusion, depression, and nervous/anxious and all other symptoms negative  PMH:  Past Medical History:  Diagnosis Date  . 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 Chronic kidney disease   . Depression   . Hyperlipidemia   . Hypertension   . Stroke [facility])     Social History:  Social History   Socioeconomic History  . Marital status: Married    Spouse name: Not on file  . Number of children: Not on file  . Years of education: Not on file  . Highest education level: Not on file  Occupational History  . Not on file  Social Needs  . Financial resource strain: Not on file  . Food insecurity:    Worry: Not on file    Inability: Not on file  . Transportation needs:    Medical: Not on file    Non-medical: Not on file  Tobacco Use  . Smoking status: Never Smoker  . Smokeless tobacco: Never Used  Substance and Sexual Activity  . Alcohol use: No  . Drug use: No  . Sexual activity: Never  Lifestyle  . Physical activity:    Days per week: Not on file    Minutes per session: Not on file  . Stress: Not on file  Relationships  . Social connections:    Talks on phone: Not on file    Gets together: Not on file    Attends religious service: Not on file    Active member of club or organization: Not on file    Attends meetings of clubs or organizations: Not on file    Relationship status: Not on file  . Intimate partner violence:    Fear of current or ex partner: Not on file    Emotionally abused: Not on file    Physically abused: Not on file     Forced sexual activity: Not on file  Other Topics Concern  . Not on file  Social History Narrative   Married.    Medications:   Current Outpatient Medications on File Prior to Visit  Medication Sig Dispense Refill  . ALPRAZolam (XANAX) 0.25 MG tablet TAKE 1 TABLET BY MOUTH TWICE DAILY AS NEEDED FOR ANXIETY 60 tablet 0  . ALPRAZolam (XANAX) 0.25 MG tablet Take 1 tablet (0.25 mg total) by mouth 2 (two) times daily as needed for anxiety. 60 tablet 0  . aspirin EC 81 MG tablet Take by mouth.    [name] atorvastatin (LIPITOR) 40 MG tablet Take 1 tablet (40 mg total) by mouth daily. (Patient taking differently: Take 40 mg by mouth at bedtime. ) 90 tablet 0  .  Calcium Carb-Ergocalciferol 500-200 MG-UNIT TABS Take by mouth.    . Cholecalciferol (VITAMIN D3) 1000 units CAPS Take by mouth.    . Cyanocobalamin (VITAMIN B 12 PO) Take 1,000 mg by mouth.    [name] FLUoxetine (PROZAC) 20 MG capsule Take by mouth.    [name] omeprazole (PRILOSEC OTC) 20 MG tablet Take 20 mg by mouth daily.    . polyvinyl alcohol (ARTIFICIAL TEARS) 1.4 % ophthalmic solution Place 1 drop into both eyes every hour as needed for dry eyes.    . traMADol (ULTRAM) 50 MG tablet Take by mouth.    [name] UNABLE TO FIND Mega red take one daily     No current facility-administered medications on file prior to visit.     Allergies:  No Known Allergies  Vitals:   05/17/18 1356  BP: 119/62  Pulse: (!) 52    Physical Exam General: frail elderly Caucasian lady seated, in no evident distress Head: head normocephalic and atraumatic.   Neck: supple with no carotid or supraclavicular bruits Cardiovascular: regular rate and rhythm, no murmurs Musculoskeletal: no deformity Skin:  no rash/petichiae Vascular:  Normal pulses all extremities  Neurologic Exam Mental Status: Awake and fully alert. Oriented to place and time. Recent and remote memory intact. Attention span, concentration and fund of knowledge appropriate. Mood and affect appropriate.  Anxious appearing Cranial Nerves: Fundoscopic exam reveals sharp disc margins. Pupils equal, briskly reactive to light. Extraocular movements full without nystagmus. Visual fields full to confrontation. Hearing intact. Facial sensation intact. Face, tongue, palate moves normally and symmetrically.  Motor: Normal bulk and tone. Normal strength in all tested extremity muscles.mild action tremor of outstretched upper extremities left greater than right. No tremor of the lower extremities; mild cogwheel rigidity Sensory.: intact to touch , pinprick , position and vibratory sensation.  Coordination: Rapid alternating movements normal in all extremities. Finger-to-nose and heel-to-shin performed accurately bilaterally. Gait and Station: Arises from chair without difficulty. Stance is normal. Gait demonstrates normal stride length and balance .mild kyphosis but no festination or retropulsion. Able to heel, toe and tandem walk without difficulty.  Reflexes: 1+ and symmetric. Toes downgoing.      ASSESSMENT: 19 year Caucasian lady with 3 month history of extremity and body tremulousness with multifocal symptoms of blurred vision, nearly passing out and speech difficulties upon arising of unclear etiology. 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 Possibilities include underlying anxiety/stress versus vertebrobasilar ischemia.  Patient returns today for reevaluation.    PLAN: -Continue Xanax 0.25 mg twice a day as needed for anxiety related tremors - refilled rx -Increase Prozac from 20 mg to 40 mg -Continue aspirin and Lipitor for secondary stroke prevention -Advised to practice stress reduction exercises -f/u with PCP regarding thyroid levels and possible medication management for hypothyroidism  Patient will follow-up in 3 months or call earlier if needed  Greater than 50% of time during this 25 minute visit was spent on counseling,explanation of diagnosis of tremors,  reviewing risk factor management of tremors and anxiety, planning of further management, discussion with patient and coordination of care  Venancio Poisson, [facility]  [facility] [address] [place], [place] [address]  Phone [phone] Fax [phone]

## 2018-05-25 NOTE — Progress Notes (Signed)
I reviewed note and agree with plan.   VIKRAM R. PENUMALLI, MD 05/25/2018, 2:29 PM Certified in Neurology, Neurophysiology and Neuroimaging  [facility] [address], [address] [place], [place] [address] [phone]  

## 2018-05-31 ENCOUNTER — Ambulatory Visit: Payer: Medicare HMO | Admitting: Neurology

## 2018-08-17 ENCOUNTER — Ambulatory Visit: Payer: Medicare HMO | Admitting: Adult Health

## 2018-08-17 ENCOUNTER — Encounter: Payer: Self-pay | Admitting: Adult Health

## 2018-08-17 VITALS — BP 120/60 | HR 69 | Ht 64.0 in | Wt 112.4 lb

## 2018-08-17 DIAGNOSIS — Z8673 Personal history of transient ischemic attack (TIA), and cerebral infarction without residual deficits: Secondary | ICD-10-CM

## 2018-08-17 DIAGNOSIS — I10 Essential (primary) hypertension: Secondary | ICD-10-CM

## 2018-08-17 DIAGNOSIS — F419 Anxiety disorder, unspecified: Secondary | ICD-10-CM

## 2018-08-17 DIAGNOSIS — R251 Tremor, unspecified: Secondary | ICD-10-CM | POA: Diagnosis not present

## 2018-08-17 DIAGNOSIS — E785 Hyperlipidemia, unspecified: Secondary | ICD-10-CM | POA: Diagnosis not present

## 2018-08-17 MED ORDER — ALPRAZOLAM ER 0.5 MG PO TB24: 0.5000 mg | ORAL_TABLET | Freq: Every day | ORAL | 3 refills | Status: DC

## 2018-08-17 NOTE — Progress Notes (Signed)
I agree with the above plan 

## 2018-08-17 NOTE — Progress Notes (Signed)
[facility] [address] [place]. [place] [address] [phone]       OFFICE FOLLOW UP NOTE  Nicole Ruiz Date of Birth:  [birth year] Medical Record Number:  [id number]   Referring MD: Bernerd Limbo  Chief Complaint  Patient presents with  . Follow-up    Tremor follow up room 9 patient alone in room has cane stated she has fell twice since last visit      Reason for Referral: tremors HPI (03/10/18 PS): Nicole Ruiz is a 67 year pleasant Caucasian lady whose seen today on consultation complaint by her husband. History is obtained from them as well as review of referral notes. Patient states for the last 3 months or so she's noticed increasing sensation of tremulousness. She describes this first noticed when she gets up in the morning she feels her body shaking from inside as well as her hands and feet. This may fluctuate but is present on most days. This tends to get better later on in the day when she is up and around on her feet and he is usually not present at night when she is sleeping. She denies any dropping of objects in her hands are diminished muscle strength or fine motor skills. She denies any drooling of saliva, bradykinesia or difficulty wearing her clothes. She does have prior history of some anxiety and mood disorder and does take Celexa 20 mg daily. She has not tried taking Klonopin or Xanax. 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 She had an extensive workup including TEE and loop recorder and no specific source of embolism was found. Since her stroke she has noticed that she has some pain and discomfort in the right hip and knee were shaking a little bit but this seems to have gotten worse in the last 2-3 months or so. She is also had some mild short-term memory difficulties since her stroke which is static and are not  progressive.Patient denies any orthostatic symptoms but she does state that during some of these episodes when she is getting up she nearly passes out and feels her vision is blurred called tunnellike at times she may have some speech difficulties and on occasion she also sees double. She denies any vertigo, focal extremity weakness or numbness.review of her loop recorder in the electroniic  medical chart and until February 2019 had not shown evidence of atral fibrillation  05/17/18 visit: MRI brain reviewed and showed multiple remote age small infarcts involving bilateral cerebellum, coronal radiata and left occipital cortex but otherwise no acute abnormalities. MRA neck negative for significant stenosis. MRA of head showed attenuation of distal M2 and M3 branches of the left MCA but no proximal LVO. Vitamin B, lipid panel and A1c all normal. TSH elevated and recommended to follow up with PCP.  Patient is being seen today for 50-month follow-up visit.  Per telephone notes, it appeared as though Xanax was only helping slightly but as patient was calling for a refill it was recommended she come into the office to be assessed.  During appointment, patient states that Xanax has been helping and that she takes 1 tablet 0.25 daily (currently prescribed for 1 tab twice daily).  She ran out recently for 2 to 3 days and her tremors did become worse.  She does use a cane or walker for years for ambulation  as she has issues with dizziness but denies wanting physical therapy assistance as this did not help in the past.  Does states she falls about once every 2 to 3 weeks without injury but this has been decreased lately she cannot remember the last time she fell. States when she falls she loses her balance and then "passes out". Also having continued speech difficulty - appears to be a "stuttering" type of speech.  Patient continues aspirin and Lipitor without side effects for secondary stroke prevention.  Patient also  continues to be compliant with Prozac 20 mg for depression.  Blood pressure today satisfactory at 190/62.  Denied follow-up appointment with PCP in regards to elevated TSH.  Patient returns for reevaluation.  08/17/18 visit: Patient seen today for scheduled follow-up appointment.  Despite use of Xanax 0.25 mg, she continues to have action tremors.  She states these tremors cause significant disability where she has difficulty throughout daily activities along with difficulty speaking at times due to the tremors.  They do increase with stress.  She has stopped driving due to these continued tremors.  She was recently started on Wellbutrin by PCP at the end of June but states she has not felt much of a difference.  She does have a follow-up appointment with him next week.  She continues to take increased dose of Prozac 40 mg as recommended at previous visit.  She did have follow-up TSH level 0.8 on 06/23/2018 and continues levothyroxine 50 mcg daily.  She continues to take aspirin with mild bruising but no bleeding.  Continues to take Lipitor without side effects myalgias.  Blood pressure today satisfactory 120/60.  No other concerns besides tremors which patient becomes tearful during appointment due to frustration of not seeing improvement.  Recommended possibly being referred for psychiatry but patient declined as she has seen therapist in the past without benefit and unable to afford additional specialty copayment.   ROS:   14 system review of systems is positive for hearing loss, ringing in ears, shortness of breath, chest tightness, restless leg, memory loss, headache, speech difficulty, weakness, agitation, depression, nervous/anxious and hallucinations and all other symptoms negative  PMH:  Past Medical History:  Diagnosis Date  . 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 Chronic kidney disease   . Depression   . Hyperlipidemia   . Hypertension   . Stroke [facility])     Social History:    Social History   Socioeconomic History  . Marital status: Married    Spouse name: Not on file  . Number of children: Not on file  . Years of education: Not on file  . Highest education level: Not on file  Occupational History  . Not on file  Social Needs  . Financial resource strain: Not on file  . Food insecurity:    Worry: Not on file    Inability: Not on file  . Transportation needs:    Medical: Not on file    Non-medical: Not on file  Tobacco Use  . Smoking status: Never Smoker  . Smokeless tobacco: Never Used  Substance and Sexual Activity  . Alcohol use: No  . Drug use: No  . Sexual activity: Never  Lifestyle  . Physical activity:    Days per week: Not on file    Minutes per session: Not on file  . Stress: Not on file  Relationships  . Social connections:    Talks on phone: Not on file    Gets  together: Not on file    Attends religious service: Not on file    Active member of club or organization: Not on file    Attends meetings of clubs or organizations: Not on file    Relationship status: Not on file  . Intimate partner violence:    Fear of current or ex partner: Not on file    Emotionally abused: Not on file    Physically abused: Not on file    Forced sexual activity: Not on file  Other Topics Concern  . Not on file  Social History Narrative   Married.    Medications:   Current Outpatient Medications on File Prior to Visit  Medication Sig Dispense Refill  . ALPRAZolam (XANAX) 0.25 MG tablet Take 1 tablet (0.25 mg total) by mouth 2 (two) times daily as needed for anxiety. (Patient taking differently: Take 0.25 mg by mouth at bedtime. ) 60 tablet 4  . aspirin EC 81 MG tablet Take by mouth.    [name] atorvastatin (LIPITOR) 20 MG tablet TAKE 1 TABLET EVERY DAY    . buPROPion (WELLBUTRIN XL) 150 MG 24 hr tablet Take by mouth.    . Calcium Carb-Ergocalciferol 500-200 MG-UNIT TABS Take by mouth.    . Cholecalciferol (VITAMIN D3) 1000 units CAPS Take by mouth.     . Cyanocobalamin (VITAMIN B 12 PO) Take 1,000 mg by mouth.    [name] FLUoxetine (PROZAC) 40 MG capsule Take 1 capsule (40 mg total) by mouth daily. 30 capsule 3  . levothyroxine (SYNTHROID, LEVOTHROID) 50 MCG tablet     . Omega-3 Fatty Acids (FISH OIL) 1000 MG CAPS Take by mouth.    [name] omeprazole (PRILOSEC OTC) 20 MG tablet Take 20 mg by mouth daily.    . polyvinyl alcohol (ARTIFICIAL TEARS) 1.4 % ophthalmic solution Place 1 drop into both eyes every hour as needed for dry eyes.    . polyvinyl alcohol (LIQUIFILM TEARS) 1.4 % ophthalmic solution Apply to eye.    . traMADol (ULTRAM) 50 MG tablet Take by mouth.    [name] UNABLE TO FIND Mega red take one daily     No current facility-administered medications on file prior to visit.     Allergies:  No Known Allergies  Vitals:   08/17/18 1443  BP: 120/60  Pulse: 69    Physical Exam General: frail elderly Caucasian lady seated, in no evident distress Head: head normocephalic and atraumatic.   Neck: supple with no carotid or supraclavicular bruits Cardiovascular: regular rate and rhythm, no murmurs Musculoskeletal: no deformity Skin:  no rash/petichiae Vascular:  Normal pulses all extremities  Neurologic Exam Mental Status: Awake and fully alert. Oriented to place and time. Recent and remote memory intact. Attention span, concentration and fund of knowledge appropriate. Mood and affect appropriate. Anxious appearing Cranial Nerves: Fundoscopic exam reveals sharp disc margins. Pupils equal, briskly reactive to light. Extraocular movements full without nystagmus. Visual fields full to confrontation. Hearing intact. Facial sensation intact. Face, tongue, palate moves normally and symmetrically.  Motor: Normal bulk and tone. Normal strength in all tested extremity muscles. action tremor noted of outstretched upper extremities left greater than right. No tremor of the lower extremities; mild cogwheel rigidity (note: As patient was standing from chair,  tremors observed in all 4 extremities along with torso) Sensory.: intact to touch , pinprick , position and vibratory sensation.  Coordination: Rapid alternating movements normal in all extremities. Finger-to-nose and heel-to-shin performed accurately bilaterally.  Gait and Station: Arises from chair without  difficulty. Stance is hunched.  Gait demonstrates normal stride length and balance with use of cane.mild kyphosis but no festination or retropulsion. Able to heel, toe and tandem walk without difficulty.  Reflexes: 1+ and symmetric. Toes downgoing.      ASSESSMENT: 41 year Caucasian lady with 3 month history of extremity and body tremulousness with multifocal symptoms of blurred vision, nearly passing out and speech difficulties upon arising of unclear etiology. 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  Patient continues to have complaints of essential tremor without benefit from Xanax or additional anti-depression medication.     PLAN: -Stop Xanax 0.25 mg twice daily and start Xanax XR 0.5 mg daily (recommended geriatric dose according to up-to-date) -Advised patient to call after 1 week if she does not see improvement and consider to start propranolol 40 mg twice daily as these tremors are debilitating and makes it difficult for patient to carry out daily activities -Continue Prozac 40 mg along with Wellbutrin 150 mg -request PCP to continue to prescribe -Continue aspirin and Lipitor for secondary stroke prevention -Advised to practice stress reduction exercises -information provided in AVS -f/u with PCP for management of chronic conditions  Patient will follow-up in 3 months or call earlier if needed  Greater than 50% of time during this 25 minute visit was spent on counseling,explanation of diagnosis of tremors, reviewing risk factor management of tremors and anxiety, planning of further management, discussion with patient and coordination of  care  Venancio Poisson, [facility]  [facility] [address] [place], [place] [address]  Phone [phone] Fax [phone]

## 2018-08-17 NOTE — Patient Instructions (Addendum)
Your Plan:  Stop xanax 0.25mg  and start Xanax XR 0.5mg  daily to see if this helps better with tremors - call after 1 week if no benefit, We will consider starting propranolol at that time  Continue to take Prozac and wellbutrin   Follow up with your primary doctor as scheduled  Practice stress reduction techniques which are provided below    Follow up in 3 months or call earlier if needed     Thank you for coming to see [place] at [facility]. I hope we have been able to provide you high quality care today.  You may receive a patient satisfaction survey over the next few weeks. We would appreciate your feedback and comments so that we may continue to improve ourselves and the health of our patients.    Mindfulness-Based Stress Reduction Mindfulness-based stress reduction (MBSR) is a program that helps people learn to practice mindfulness. Mindfulness is the practice of intentionally paying attention to the present moment. It can be learned and practiced through techniques such as education, breathing exercises, meditation, and yoga. MBSR includes several mindfulness techniques in one program. MBSR works best when you understand the treatment, are willing to try new things, and can commit to spending time practicing what you learn. MBSR training may include learning about:  How your emotions, thoughts, and reactions affect your body.  New ways to respond to things that cause negative thoughts to start (triggers).  How to notice your thoughts and let go of them.  Practicing awareness of everyday things that you normally do without thinking.  The techniques and goals of different types of meditation.  What are the benefits of MBSR? MBSR can have many benefits, which include helping you to:  Develop self-awareness. This refers to knowing and understanding yourself.  Learn skills and attitudes that help you to participate in your own health care.  Learn new ways  to care for yourself.  Be more accepting about how things are, and let things go.  Be less judgmental and approach things with an open mind.  Be patient with yourself and trust yourself more.  MBSR has also been shown to:  Reduce negative emotions, such as depression and anxiety.  Improve memory and focus.  Change how you sense and approach pain.  Boost your body's ability to fight infections.  Help you connect better with other people.  Improve your sense of well-being.  Follow these instructions at home:  Find a local in-person or online MBSR program.  Set aside some time regularly for mindfulness practice.  Find a mindfulness practice that works best for you. This may include one or more of the following: ? Meditation. Meditation involves focusing your mind on a certain thought or activity. ? Breathing awareness exercises. These help you to stay present by focusing on your breath. ? Body scan. For this practice, you lie down and pay attention to each part of your body from head to toe. You can identify tension and soreness and intentionally relax parts of your body. ? Yoga. Yoga involves stretching and breathing, and it can improve your ability to move and be flexible. It can also provide an experience of testing your body's limits, which can help you release stress. ? Mindful eating. This way of eating involves focusing on the taste, texture, color, and smell of each bite of food. Because this slows down eating and helps you feel full sooner, it can be an important part of a weight-loss plan.  Find a  podcast or recording that provides guidance for breathing awareness, body scan, or meditation exercises. You can listen to these any time when you have a free moment to rest without distractions.  Follow your treatment plan as told by your health care provider. This may include taking regular medicines and making changes to your diet or lifestyle as recommended. How to practice  mindfulness To do a basic awareness exercise:  Find a comfortable place to sit.  Pay attention to the present moment. Observe your thoughts, feelings, and surroundings just as they are.  Avoid placing judgment on yourself, your feelings, or your surroundings. Make note of any judgment that comes up, and let it go.  Your mind may wander, and that is okay. Make note of when your thoughts drift, and return your attention to the present moment.  To do basic mindfulness meditation:  Find a comfortable place to sit. This may include a stable chair or a firm floor cushion. ? Sit upright with your back straight. Let your arms fall next to your side with your hands resting on your legs. ? If sitting in a chair, rest your feet flat on the floor. ? If sitting on a cushion, cross your legs in front of you.  Keep your head in a neutral position with your chin dropped slightly. Relax your jaw and rest the tip of your tongue on the roof of your mouth. Drop your gaze to the floor. You can close your eyes if you like.  Breathe normally and pay attention to your breath. Feel the air moving in and out of your nose. Feel your belly expanding and relaxing with each breath.  Your mind may wander, and that is okay. Make note of when your thoughts drift, and return your attention to your breath.  Avoid placing judgment on yourself, your feelings, or your surroundings. Make note of any judgment or feelings that come up, let them go, and bring your attention back to your breath.  When you are ready, lift your gaze or open your eyes. Pay attention to how your body feels after the meditation.  Where to find more information: You can find more information about MBSR from:  Your health care provider.  Community-based meditation centers or programs.  Programs offered near you.  Summary  Mindfulness-based stress reduction (MBSR) is a program that teaches you how to intentionally pay attention to the present  moment. It is used with other treatments to help you cope better with daily stress, emotions, and pain.  MBSR focuses on developing self-awareness, which allows you to respond to life stress without judgment or negative emotions.  MBSR programs may involve learning different mindfulness practices, such as breathing exercises, meditation, yoga, body scan, or mindful eating. Find a mindfulness practice that works best for you, and set aside time for it on a regular basis. This information is not intended to replace advice given to you by your health care provider. Make sure you discuss any questions you have with your health care provider. Document Released: 04/22/2017 Document Revised: 04/22/2017 Document Reviewed: 04/22/2017 Elsevier Interactive Patient Education  [employer].

## 2018-11-17 ENCOUNTER — Ambulatory Visit: Payer: Medicare HMO | Admitting: Adult Health

## 2018-11-17 ENCOUNTER — Encounter: Payer: Self-pay | Admitting: Adult Health

## 2018-11-17 VITALS — BP 128/60 | HR 57 | Ht 64.0 in | Wt 112.0 lb

## 2018-11-17 DIAGNOSIS — R251 Tremor, unspecified: Secondary | ICD-10-CM | POA: Diagnosis not present

## 2018-11-17 DIAGNOSIS — E785 Hyperlipidemia, unspecified: Secondary | ICD-10-CM

## 2018-11-17 DIAGNOSIS — Z8673 Personal history of transient ischemic attack (TIA), and cerebral infarction without residual deficits: Secondary | ICD-10-CM | POA: Diagnosis not present

## 2018-11-17 DIAGNOSIS — F419 Anxiety disorder, unspecified: Secondary | ICD-10-CM

## 2018-11-17 DIAGNOSIS — I10 Essential (primary) hypertension: Secondary | ICD-10-CM

## 2018-11-17 MED ORDER — PROPRANOLOL HCL 40 MG PO TABS: 40.0000 mg | ORAL_TABLET | Freq: Two times a day (BID) | ORAL | 3 refills | Status: DC

## 2018-11-17 NOTE — Progress Notes (Signed)
[facility] [address] [place]. [place] [address] [phone]       OFFICE FOLLOW UP NOTE  [name] Date of Birth:  25-Nov-1941 Medical Record Number:  [id number]   Referring MD: Bernerd Limbo  Chief Complaint  Patient presents with  . Follow-up    Tremors and Stroke follow up room 9 pt with      Reason for Referral: tremors HPI (03/10/18 PS): [name] is a 61 year pleasant Caucasian lady whose seen today on consultation complaint by her husband. History is obtained from them as well as review of referral notes. Patient states for the last 3 months or so she's noticed increasing sensation of tremulousness. She describes this first noticed when she gets up in the morning she feels her body shaking from inside as well as her hands and feet. This may fluctuate but is present on most days. This tends to get better later on in the day when she is up and around on her feet and he is usually not present at night when she is sleeping. She denies any dropping of objects in her hands are diminished muscle strength or fine motor skills. She denies any drooling of saliva, bradykinesia or difficulty wearing her clothes. She does have prior history of some anxiety and mood disorder and does take Celexa 20 mg daily. She has not tried taking Klonopin or Xanax. 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 She had an extensive workup including TEE and loop recorder and no specific source of embolism was found. Since her stroke she has noticed that she has some pain and discomfort in the right hip and knee were shaking a little bit but this seems to have gotten worse in the last 2-3 months or so. She is also had some mild short-term memory difficulties since her stroke which is static and are not progressive.Patient denies any orthostatic symptoms but she does state  that during some of these episodes when she is getting up she nearly passes out and feels her vision is blurred called tunnellike at times she may have some speech difficulties and on occasion she also sees double. She denies any vertigo, focal extremity weakness or numbness.review of her loop recorder in the electroniic  medical chart and until February 2019 had not shown evidence of atral fibrillation  05/17/18 visit: MRI brain reviewed and showed multiple remote age small infarcts involving bilateral cerebellum, coronal radiata and left occipital cortex but otherwise no acute abnormalities. MRA neck negative for significant stenosis. MRA of head showed attenuation of distal M2 and M3 branches of the left MCA but no proximal LVO. Vitamin B, lipid panel and A1c all normal. TSH elevated and recommended to follow up with PCP.  Patient is being seen today for 55-month follow-up visit.  Per telephone notes, it appeared as though Xanax was only helping slightly but as patient was calling for a refill it was recommended she come into the office to be assessed.  During appointment, patient states that Xanax has been helping and that she takes 1 tablet 0.25 daily (currently prescribed for 1 tab twice daily).  She ran out recently for 2 to 3 days and her tremors did become worse.  She does use a cane or walker for years for ambulation as she has issues with dizziness but denies wanting physical  therapy assistance as this did not help in the past.  Does states she falls about once every 2 to 3 weeks without injury but this has been decreased lately she cannot remember the last time she fell. States when she falls she loses her balance and then "passes out". Also having continued speech difficulty - appears to be a "stuttering" type of speech.  Patient continues aspirin and Lipitor without side effects for secondary stroke prevention.  Patient also continues to be compliant with Prozac 20 mg for depression.  Blood pressure  today satisfactory at 190/62.  Denied follow-up appointment with PCP in regards to elevated TSH.  Patient returns for reevaluation.  08/17/18 visit: Patient seen today for scheduled follow-up appointment.  Despite use of Xanax 0.25 mg, she continues to have action tremors.  She states these tremors cause significant disability where she has difficulty throughout daily activities along with difficulty speaking at times due to the tremors.  They do increase with stress.  She has stopped driving due to these continued tremors.  She was recently started on Wellbutrin by PCP at the end of June but states she has not felt much of a difference.  She does have a follow-up appointment with him next week.  She continues to take increased dose of Prozac 40 mg as recommended at previous visit.  She did have follow-up TSH level 0.8 on 06/23/2018 and continues levothyroxine 50 mcg daily.  She continues to take aspirin with mild bruising but no bleeding.  Continues to take Lipitor without side effects myalgias.  Blood pressure today satisfactory 120/60.  No other concerns besides tremors which patient becomes tearful during appointment due to frustration of not seeing improvement.  Recommended possibly being referred for psychiatry but patient declined as she has seen therapist in the past without benefit and unable to afford additional specialty copayment.  Interval history 11/17/2018: Patient returns today for follow-up visit.  After prior visit, was recommended to start Xanax XR for continued tremors.  She does state that the Xanax helps tremor some but not completely.  Xanax IR was initially prescribed as it was believed these tremors were anxiety related and as she continued to experience tremors despite Xanax IR, Xanax XR was trialed.  She was recently started on propranolol 10 mg twice daily by her PCP for continued tremors with recommendations of discontinuing Xanax XR.  At this time, patient continues to take Xanax XR  along with continuation of propranolol 10 mg twice daily.  She states that the propranolol helped slightly but she does continue to have the tremors.  Blood pressure today 128/60.  She does continue to state that tremors interfere with her daily activities and makes it difficult to perform ADLs.  She also recently had a fall approximately 1.5 months ago where she states she fell face forward and subsequently lacerated chin but did not receive medical attention.  She has been complaining of these falling attacks during prior follow-up appointments and had undergone EEG and EMG but both unremarkable. Due to her continuous falling, she does state that she limits her activity and ambulation during the day due to fear of falling.  She does experience dizziness as well but does not believe the dizziness causes her falls as she has no warning prior to falling and does lose consciousness for a brief amount of time.  She does state that she has seen psychology in the past and per patient, it was determined as though her anxiety was not causing the  symptoms per patient and refuses to return to psychology.  She does continue to take Prozac 40 mg daily along with Wellbutrin 150 mg daily but she does endorse continued anxiety and depression symptoms.  Even during office visit conversation, when she starts to express a concern such as not being able to ambulate frequently due to fears of falling, her speech becomes shaky with mild aphasia (which has also been expressed at prior follow-up appointments).  She also endorses episodes of blurry vision with tunnel vision and a sensation of her "ears are about to explode" and feeling as though "even the inside of my body shakes".  No new concerns were discussed during today's appointment.  She denies any worsening of the above symptoms over time, just frustrated as she is been able to obtain relief.     ROS:   14 system review of systems is positive for hearing loss, ringing in  ears, trouble swallowing, eye itching, light sensitivity, blurred vision, shortness of breath, cold intolerance, daytime sleepiness, walking difficulty, rash, memory loss, dizziness, headache, numbness, speech difficulty, weakness, and confusion and all other symptoms negative  PMH:  Past Medical History:  Diagnosis Date  . 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 Chronic kidney disease   . Depression   . Hyperlipidemia   . Hypertension   . Stroke [facility]     Social History:  Social History   Socioeconomic History  . Marital status: Married    Spouse name: Not on file  . Number of children: Not on file  . Years of education: Not on file  . Highest education level: Not on file  Occupational History  . Not on file  Social Needs  . Financial resource strain: Not on file  . Food insecurity:    Worry: Not on file    Inability: Not on file  . Transportation needs:    Medical: Not on file    Non-medical: Not on file  Tobacco Use  . Smoking status: Never Smoker  . Smokeless tobacco: Never Used  Substance and Sexual Activity  . Alcohol use: No  . Drug use: No  . Sexual activity: Never  Lifestyle  . Physical activity:    Days per week: Not on file    Minutes per session: Not on file  . Stress: Not on file  Relationships  . Social connections:    Talks on phone: Not on file    Gets together: Not on file    Attends religious service: Not on file    Active member of club or organization: Not on file    Attends meetings of clubs or organizations: Not on file    Relationship status: Not on file  . Intimate partner violence:    Fear of current or ex partner: Not on file    Emotionally abused: Not on file    Physically abused: Not on file    Forced sexual activity: Not on file  Other Topics Concern  . Not on file  Social History Narrative   Married.    Medications:   Current Outpatient Medications on File Prior to Visit  Medication Sig Dispense Refill  .  ALPRAZolam (XANAX XR) 0.5 MG 24 hr tablet Take 1 tablet (0.5 mg total) by mouth daily. 30 tablet 3  . aspirin EC 81 MG tablet Take by mouth.    [name] atorvastatin (LIPITOR) 20 MG tablet TAKE 1 TABLET EVERY DAY    . buPROPion (WELLBUTRIN XL) 150 MG 24  hr tablet Take by mouth.    . Calcium Carb-Ergocalciferol 500-200 MG-UNIT TABS Take by mouth.    . Calcium Carb-Ergocalciferol 500-200 MG-UNIT TABS Take by mouth.    . Cholecalciferol (VITAMIN D3) 1000 units CAPS Take by mouth.    . Cyanocobalamin (VITAMIN B 12 PO) Take 1,000 mg by mouth.    [name] FLUoxetine (PROZAC) 40 MG capsule Take 1 capsule (40 mg total) by mouth daily. 30 capsule 3  . levothyroxine (SYNTHROID, LEVOTHROID) 50 MCG tablet     . Omega-3 Fatty Acids (FISH OIL) 1000 MG CAPS Take by mouth.    [name] omeprazole (PRILOSEC OTC) 20 MG tablet Take 20 mg by mouth daily.    . polyvinyl alcohol (ARTIFICIAL TEARS) 1.4 % ophthalmic solution Place 1 drop into both eyes every hour as needed for dry eyes.    [name] propranolol (INDERAL) 10 MG tablet Take 10 mg by mouth 2 (two) times daily.  5  . traMADol (ULTRAM) 50 MG tablet Take by mouth.    [name] UNABLE TO FIND Mega red take one daily     No current facility-administered medications on file prior to visit.     Allergies:  No Known Allergies  Vitals:   11/17/18 1445  BP: 128/60  Pulse: (!) 57    Physical Exam General: frail elderly Caucasian lady seated, in no evident distress Head: head normocephalic and atraumatic.   Neck: supple with no carotid or supraclavicular bruits Cardiovascular: regular rate and rhythm, no murmurs Musculoskeletal: no deformity Skin:  no rash/petichiae Vascular:  Normal pulses all extremities  Neurologic Exam Mental Status: Awake and fully alert. Oriented to place and time. Recent and remote memory intact. Attention span, concentration and fund of knowledge appropriate. Mood and affect appropriate. Anxious appearing Cranial Nerves: Fundoscopic exam reveals sharp disc  margins. Pupils equal, briskly reactive to light. Extraocular movements full without nystagmus. Visual fields full to confrontation. Hearing intact. Facial sensation intact. Face, tongue, palate moves normally and symmetrically.  Motor: Normal bulk and tone. Normal strength in all tested extremity muscles. action tremor noted of outstretched upper extremities left greater than right.  Did not observe tremors during rest.  No tremor of the lower extremities; mild cogwheel rigidity (note: As patient was standing from chair, tremors observed in all 4 extremities along with torso) Sensory.: intact to touch , pinprick , position and vibratory sensation.  Coordination: Rapid alternating movements normal in all extremities. Finger-to-nose and heel-to-shin performed accurately bilaterally.  Gait and Station: Arises from chair without difficulty. Stance is hunched.  Gait demonstrates normal stride length and balance with use of cane.mild kyphosis but no festination or retropulsion.  Reflexes: 1+ and symmetric. Toes downgoing.      ASSESSMENT: 49 year Caucasian lady with history of extremity and body tremulousness with multifocal symptoms of blurred vision, nearly passing out and speech difficulties upon arising of unclear etiology. 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  Patient is being seen today for follow-up appointment and does continue to experience tremors and "falling episodes" (see HPI)     PLAN: -As patient continues to have tremors despite use of Xanax XR and propranolol 10 mg twice daily, it will be recommended to increase propranolol dose to 40 mg twice daily as this was discussed at prior appointment.  Advised patient that if she starts to obtain relief with decreased tremors, we can consider stopping Xanax XR at that time.  If she continues to experience tremors, we can consider  increasing dosage or initiating primidone -Highly encouraged patient to consider  participating in PT for continued dizziness and frequent falls as it is difficult to tell these falls are anxiety related, cerebellar infarct related, possibly inner ear related or vertigo -patient verbalized that she will call office if she is interested in participating in therapy -Continue Prozac 40 mg along with Wellbutrin 150 mg -request PCP to continue to prescribe -Continue aspirin and Lipitor for secondary stroke prevention -f/u with PCP for management of chronic conditions  Patient will follow-up in 2 months or call earlier if needed  Greater than 50% of time during this 25 minute visit was spent on counseling,explanation of diagnosis of tremors, reviewing risk factor management of tremors and anxiety, planning of further management, discussion with patient and coordination of care  Venancio Poisson, [facility]  [facility] [address] [place], [place] [address]  Phone [phone] Fax [phone]

## 2018-11-17 NOTE — Patient Instructions (Addendum)
Continue aspirin 81 mg daily  and Lipitor  for secondary stroke prevention  Continue to follow up with PCP regarding cholesterol and blood pressure management   Increase propranolol from 10mg  twice a day to 40mg  twice a day for continued tremors  Highly consider doing physical therapy for continued dizziness and frequent falls  Continue xanax XR for the time being. After we can stabilize your tremors, we can talk about weaning off from the Xanax. If you continue to experience anxiety symptoms, please follow with your PCP  Continue to monitor blood pressure at home  Maintain strict control of hypertension with blood pressure goal below 130/90, diabetes with hemoglobin A1c goal below 6.5% and cholesterol with LDL cholesterol (bad cholesterol) goal below 70 mg/dL. I also advised the patient to eat a healthy diet with plenty of whole grains, cereals, fruits and vegetables, exercise regularly and maintain ideal body weight.  Followup in the future with me in 2 months or call earlier if needed       Thank you for coming to see [place] at [facility]. I hope we have been able to provide you high quality care today.  You may receive a patient satisfaction survey over the next few weeks. We would appreciate your feedback and comments so that we may continue to improve ourselves and the health of our patients.

## 2018-11-21 NOTE — Progress Notes (Signed)
I agree with the above plan 

## 2019-01-17 ENCOUNTER — Encounter: Payer: Self-pay | Admitting: Adult Health

## 2019-01-17 ENCOUNTER — Ambulatory Visit: Payer: Medicare HMO | Admitting: Adult Health

## 2019-01-17 VITALS — BP 154/60 | HR 46 | Ht 64.0 in | Wt 117.6 lb

## 2019-01-17 DIAGNOSIS — I10 Essential (primary) hypertension: Secondary | ICD-10-CM | POA: Diagnosis not present

## 2019-01-17 DIAGNOSIS — F419 Anxiety disorder, unspecified: Secondary | ICD-10-CM

## 2019-01-17 DIAGNOSIS — Z8673 Personal history of transient ischemic attack (TIA), and cerebral infarction without residual deficits: Secondary | ICD-10-CM | POA: Diagnosis not present

## 2019-01-17 DIAGNOSIS — E785 Hyperlipidemia, unspecified: Secondary | ICD-10-CM

## 2019-01-17 DIAGNOSIS — R251 Tremor, unspecified: Secondary | ICD-10-CM | POA: Diagnosis not present

## 2019-01-17 MED ORDER — PROPRANOLOL HCL 20 MG PO TABS: 20.0000 mg | ORAL_TABLET | Freq: Two times a day (BID) | ORAL | 0 refills | Status: DC

## 2019-01-17 NOTE — Patient Instructions (Addendum)
Continue aspirin 81 mg daily  and lipitor  for secondary stroke prevention  Referral placed to neuropsychology - you will be called to schedule appointment  We will start to decrease propranolol - start to take 20mg  twice daily for 2 weeks. After 2 weeks, you can stop medication  Continue to follow up with PCP regarding cholesterol and blood pressure management   Continue to monitor blood pressure at home  Maintain strict control of hypertension with blood pressure goal below 130/90, diabetes with hemoglobin A1c goal below 6.5% and cholesterol with LDL cholesterol (bad cholesterol) goal below 70 mg/dL. I also advised the patient to eat a healthy diet with plenty of whole grains, cereals, fruits and vegetables, exercise regularly and maintain ideal body weight.  Followup in the future with me as needed or call earlier if needed        Thank you for coming to see [place] at [facility]. I hope we have been able to provide you high quality care today.  You may receive a patient satisfaction survey over the next few weeks. We would appreciate your feedback and comments so that we may continue to improve ourselves and the health of our patients.

## 2019-01-17 NOTE — Progress Notes (Signed)
[facility] [address] [place]. [place] [address] [phone]       OFFICE FOLLOW UP NOTE  Nicole Ruiz Date of Birth:  04/08/1941 Medical Record Number:  [id number]   Referring MD: Nicole Ruiz  Chief Complaint  Patient presents with  . Follow-up    2 month follow up. Husband present. Rm 9. No new concerns at this time.      Reason for Referral: tremors HPI (03/10/18 PS): [name] is a 75 year pleasant Caucasian lady whose seen today on consultation complaint by her husband. History is obtained from them as well as review of referral notes. Patient states for the last 3 months or so she's noticed increasing sensation of tremulousness. She describes this first noticed when she gets up in the morning she feels her body shaking from inside as well as her hands and feet. This may fluctuate but is present on most days. This tends to get better later on in the day when she is up and around on her feet and he is usually not present at night when she is sleeping. She denies any dropping of objects in her hands are diminished muscle strength or fine motor skills. She denies any drooling of saliva, bradykinesia or difficulty wearing her clothes. She does have prior history of some anxiety and mood disorder and does take Celexa 20 mg daily. She has not tried taking Klonopin or Xanax. 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 She had an extensive workup including TEE and loop recorder and no specific source of embolism was found. Since her stroke she has noticed that she has some pain and discomfort in the right hip and knee were shaking a little bit but this seems to have gotten worse in the last 2-3 months or so. She is also had some mild short-term memory difficulties since her stroke which is static and are not progressive.Patient denies any orthostatic  symptoms but she does state that during some of these episodes when she is getting up she nearly passes out and feels her vision is blurred called tunnellike at times she may have some speech difficulties and on occasion she also sees double. She denies any vertigo, focal extremity weakness or numbness.review of her loop recorder in the electroniic  medical chart and until February 2019 had not shown evidence of atral fibrillation  05/17/18 visit: MRI brain reviewed and showed multiple remote age small infarcts involving bilateral cerebellum, coronal radiata and left occipital cortex but otherwise no acute abnormalities. MRA neck negative for significant stenosis. MRA of head showed attenuation of distal M2 and M3 branches of the left MCA but no proximal LVO. Vitamin B, lipid panel and A1c all normal. TSH elevated and recommended to follow up with PCP.  Patient is being seen today for 1-month follow-up visit.  Per telephone notes, it appeared as though Xanax was only helping slightly but as patient was calling for a refill it was recommended she come into the office to be assessed.  During appointment, patient states that Xanax has been helping and that she takes 1 tablet 0.25 daily (currently prescribed for 1 tab twice daily).  She ran out recently for 2 to 3 days and her tremors did become worse.  She does use a cane or walker for years for ambulation as she has issues with  dizziness but denies wanting physical therapy assistance as this did not help in the past.  Does states she falls about once every 2 to 3 weeks without injury but this has been decreased lately she cannot remember the last time she fell. States when she falls she loses her balance and then "passes out". Also having continued speech difficulty - appears to be a "stuttering" type of speech.  Patient continues aspirin and Lipitor without side effects for secondary stroke prevention.  Patient also continues to be compliant with Prozac 20 mg for  depression.  Blood pressure today satisfactory at 190/62.  Denied follow-up appointment with PCP in regards to elevated TSH.  Patient returns for reevaluation.  08/17/18 visit: Patient seen today for scheduled follow-up appointment.  Despite use of Xanax 0.25 mg, she continues to have action tremors.  She states these tremors cause significant disability where she has difficulty throughout daily activities along with difficulty speaking at times due to the tremors.  They do increase with stress.  She has stopped driving due to these continued tremors.  She was recently started on Wellbutrin by PCP at the end of June but states she has not felt much of a difference.  She does have a follow-up appointment with him next week.  She continues to take increased dose of Prozac 40 mg as recommended at previous visit.  She did have follow-up TSH level 0.8 on 06/23/2018 and continues levothyroxine 50 mcg daily.  She continues to take aspirin with mild bruising but no bleeding.  Continues to take Lipitor without side effects myalgias.  Blood pressure today satisfactory 120/60.  No other concerns besides tremors which patient becomes tearful during appointment due to frustration of not seeing improvement.  Recommended possibly being referred for psychiatry but patient declined as she has seen therapist in the past without benefit and unable to afford additional specialty copayment.  Visit 11/17/2018: Patient returns today for follow-up visit.  After prior visit, was recommended to start Xanax XR for continued tremors.  She does state that the Xanax helps tremor some but not completely.  Xanax IR was initially prescribed as it was believed these tremors were anxiety related and as she continued to experience tremors despite Xanax IR, Xanax XR was trialed.  She was recently started on propranolol 10 mg twice daily by her PCP for continued tremors with recommendations of discontinuing Xanax XR.  At this time, patient continues  to take Xanax XR along with continuation of propranolol 10 mg twice daily.  She states that the propranolol helped slightly but she does continue to have the tremors.  Blood pressure today 128/60.  She does continue to state that tremors interfere with her daily activities and makes it difficult to perform ADLs.  She also recently had a fall approximately 1.5 months ago where she states she fell face forward and subsequently lacerated chin but did not receive medical attention.  She has been complaining of these falling attacks during prior follow-up appointments and had undergone EEG and EMG but both unremarkable. Due to her continuous falling, she does state that she limits her activity and ambulation during the day due to fear of falling.  She does experience dizziness as well but does not believe the dizziness causes her falls as she has no warning prior to falling and does lose consciousness for a brief amount of time.  She does state that she has seen psychology in the past and per patient, it was determined as though her anxiety  was not causing the symptoms per patient and refuses to return to psychology.  She does continue to take Prozac 40 mg daily along with Wellbutrin 150 mg daily but she does endorse continued anxiety and depression symptoms.  Even during office visit conversation, when she starts to express a concern such as not being able to ambulate frequently due to fears of falling, her speech becomes shaky with mild aphasia (which has also been expressed at prior follow-up appointments).  She also endorses episodes of blurry vision with tunnel vision and a sensation of her "ears are about to explode" and feeling as though "even the inside of my body shakes".  No new concerns were discussed during today's appointment.  She denies any worsening of the above symptoms over time, just frustrated as she is been able to obtain relief.  Interval history 01/18/2019: Patient is being seen today for  follow-up visit and is accompanied by her husband.  After prior visit, propranolol increased to 40 mg twice daily and since this time she states tremors have been worsening. They have been waking her up at night. When she attempts to stand up, her tremors increase and make it difficult to ambulate or stand. She states sitting in the chair, she feels as though she is trembling "in side" her body.  She also has difficulty speaking when she has worsening of her tremors.  Her husband endorses worsening with increased anxiety or stress.  She states she feels as though her anxiety and depression are currently controlled but does continue to have fears of falling if she is ambulating therefore limits her activity during the day.  She has been using rolling walker when she does ambulate due to her continued fear of falling.  She denies any recent falls since prior visit.  She states approximately 2-3 times per week she feels as though she may be having "mini strokes" as she will feel sensation of generalized weakness and has difficulty speaking.  She is currently on Xanax XR 0.5 mg daily, Wellbutrin 150 mg daily and Prozac 40 mg daily.  She continues on aspirin 81 mg without side effects of bleeding or bruising.  Continues on atorvastatin without side effects of myalgias.  Blood pressure today 154/60.  No further concerns at this time.  Denies new or worsening stroke/TIA symptoms.    ROS:   14 system review of systems is positive for chills, fatigue, hearing loss, ringing in ears, runny nose, trouble swallowing, eye itching, light sensitivity, blurred vision, shortness of breath, choking, cold intolerance, nausea, vomiting, restless leg, frequent waking, daytime sleepiness, joint pain, back pain, aching muscles, walking difficulty, neck pain, neck stiffness, wounds, memory loss, dizziness, headache, numbness, speech difficulty, weakness, tremors, passing out, depression and nervous/anxious and all other symptoms  negative  PMH:  Past Medical History:  Diagnosis Date  . 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 Chronic kidney disease   . Depression   . Hyperlipidemia   . Hypertension   . Stroke [facility])     Social History:  Social History   Socioeconomic History  . Marital status: Married    Spouse name: Not on file  . Number of children: Not on file  . Years of education: Not on file  . Highest education level: Not on file  Occupational History  . Not on file  Social Needs  . Financial resource strain: Not on file  . Food insecurity:    Worry: Not on file    Inability:  Not on file  . Transportation needs:    Medical: Not on file    Non-medical: Not on file  Tobacco Use  . Smoking status: Never Smoker  . Smokeless tobacco: Never Used  Substance and Sexual Activity  . Alcohol use: No  . Drug use: No  . Sexual activity: Never  Lifestyle  . Physical activity:    Days per week: Not on file    Minutes per session: Not on file  . Stress: Not on file  Relationships  . Social connections:    Talks on phone: Not on file    Gets together: Not on file    Attends religious service: Not on file    Active member of club or organization: Not on file    Attends meetings of clubs or organizations: Not on file    Relationship status: Not on file  . Intimate partner violence:    Fear of current or ex partner: Not on file    Emotionally abused: Not on file    Physically abused: Not on file    Forced sexual activity: Not on file  Other Topics Concern  . Not on file  Social History Narrative   Married.    Medications:   Current Outpatient Medications on File Prior to Visit  Medication Sig Dispense Refill  . ALPRAZolam (XANAX XR) 0.5 MG 24 hr tablet Take 1 tablet (0.5 mg total) by mouth daily. 30 tablet 3  . aspirin EC 81 MG tablet Take by mouth.    [name] atorvastatin (LIPITOR) 20 MG tablet TAKE 1 TABLET EVERY DAY    . buPROPion (WELLBUTRIN XL) 150 MG 24 hr tablet Take by mouth.      . Calcium Carb-Ergocalciferol 500-200 MG-UNIT TABS Take by mouth.    . Calcium Carb-Ergocalciferol 500-200 MG-UNIT TABS Take by mouth.    . Cholecalciferol (VITAMIN D3) 1000 units CAPS Take by mouth.    . Cyanocobalamin (VITAMIN B 12 PO) Take 1,000 mg by mouth.    [name] FLUoxetine (PROZAC) 40 MG capsule Take 1 capsule (40 mg total) by mouth daily. 30 capsule 3  . levothyroxine (SYNTHROID, LEVOTHROID) 50 MCG tablet     . Omega-3 Fatty Acids (FISH OIL) 1000 MG CAPS Take by mouth.    [name] omeprazole (PRILOSEC OTC) 20 MG tablet Take 20 mg by mouth daily.    . polyvinyl alcohol (ARTIFICIAL TEARS) 1.4 % ophthalmic solution Place 1 drop into both eyes every hour as needed for dry eyes.    [name] propranolol (INDERAL) 40 MG tablet Take 1 tablet (40 mg total) by mouth 2 (two) times daily. 60 tablet 3  . traMADol (ULTRAM) 50 MG tablet Take by mouth.    [name] UNABLE TO FIND Mega red take one daily     No current facility-administered medications on file prior to visit.     Allergies:  No Known Allergies  Vitals:   01/17/19 1526  BP: (!) 154/60  Pulse: (!) 46    Physical Exam General: frail elderly Caucasian lady seated, in no evident distress Head: head normocephalic and atraumatic.   Neck: supple with no carotid or supraclavicular bruits Cardiovascular: regular rate and rhythm, no murmurs Musculoskeletal: no deformity Skin:  no rash/petichiae Vascular:  Normal pulses all extremities  Neurologic Exam Mental Status: Awake and fully alert. Oriented to place and time. Recent and remote memory intact. Attention span, concentration and fund of knowledge appropriate. Mood and affect appropriate.  Intermittent stuttering of speech greater when attempting to speak  too quickly Cranial Nerves: Right pupil nonreactive (chronic); left pupil reactive and equal; Extraocular movements full without nystagmus. Visual fields full to confrontation. Hearing intact. Facial sensation intact. Face, tongue, palate moves normally  and symmetrically.  Motor: Normal bulk and tone. Normal strength in all tested extremity muscles.  Tremoring action noted at rest and increases with action along with evidence of ataxia Sensory.: intact to touch , pinprick , position and vibratory sensation.  Coordination: Rapid alternating movements normal in all extremities. Finger-to-nose and heel-to-shin evidence of ataxia in all 4 extremities Gait and Station: Arises from chair without difficulty. Stance is hunched.  Gait demonstrates normal stride length and balance with use of rolling walker.  Reflexes: 1+ and symmetric. Toes downgoing.      ASSESSMENT: 58 year Caucasian lady with history of extremity and body tremulousness with multifocal symptoms of blurred vision, nearly passing out and speech difficulties upon arising of unclear etiology. 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  Patient is being seen today for follow-up and continues to have generalized tremors and ataxia.     PLAN: -Long discussion with patient and husband regarding ongoing tremor/ataxia and 2-3 times per week episodes of generalized weakness and difficulty speaking are all most likely due to ongoing anxiety and depression.  She greatly limits her activity during the day due to her ongoing fear of falling.  Tremors worsen with anxiety and depression.  Patient does have underlying history of anxiety and depression and does endorse worsening after her stroke.  Multiple medications trialed but did not provide benefit and with most recent propranolol patient endorses worsening.  Would not recommend initiating primidone due to side effects.  It was highly encouraged patient be evaluated by neuropsychology -patient agreed and referral placed -Discontinue propranolol due to no additional benefit of continuation -advised to start 20 mg twice daily for 2 weeks and then can discontinue -Continue to follow with PCP during the interval time for  continued depression/anxiety management -Encourage participation with PT or OT to assist with safe for ambulation and possibly increasing confidence with ambulation but patient refuses at this time.  It was also recommended to participate in OT due to ataxia but patient refuses at this time.  She was advised to call office in the future if she would like referral to participate -Continue aspirin and Lipitor for secondary stroke prevention   Patient will follow-up as needed or call office with questions or concerns  Greater than 50% of time during this 25 minute visit was spent on counseling,explanation of diagnosis of tremors, reviewing risk factor management of tremors and anxiety, planning of further management, discussion with patient and coordination of care  Venancio Poisson, [facility]  [facility] [address] [place], [place] [address]  Phone [phone] Fax [phone]

## 2019-01-18 ENCOUNTER — Ambulatory Visit: Payer: Medicare HMO | Admitting: Adult Health

## 2019-01-19 ENCOUNTER — Other Ambulatory Visit: Payer: Self-pay | Admitting: Adult Health

## 2019-01-24 NOTE — Progress Notes (Signed)
I agree with the above plan 

## 2020-03-01 ENCOUNTER — Ambulatory Visit: Payer: Medicare HMO | Attending: Internal Medicine

## 2020-03-01 DIAGNOSIS — Z23 Encounter for immunization: Secondary | ICD-10-CM

## 2020-03-01 NOTE — Progress Notes (Signed)
   Covid-19 Vaccination Clinic  Name:  Nicole Ruiz    MRN: [id number] DOB: 02-19-1941  03/01/2020  [name] was observed post Covid-19 immunization for 15 minutes without incident. She was provided with Vaccine Information Sheet and instruction to access the V-Safe system.   [name] was instructed to call 911 with any severe reactions post vaccine: [name] Difficulty breathing  . Swelling of face and throat  . A fast heartbeat  . A bad rash all over body  . Dizziness and weakness   Immunizations Administered    Name Date Dose VIS Date Route   Pfizer COVID-19 Vaccine 03/01/2020 11:04 AM 0.3 mL 12/08/2019 Intramuscular   Manufacturer: [place]   Lot: [id number]   [place]: [id number]

## 2020-03-27 ENCOUNTER — Ambulatory Visit: Payer: Medicare HMO | Attending: Internal Medicine

## 2020-03-27 DIAGNOSIS — Z23 Encounter for immunization: Secondary | ICD-10-CM

## 2020-03-27 NOTE — Progress Notes (Signed)
   Covid-19 Vaccination Clinic  Name:  YUKI GOERTZEN    MRN: [id number] DOB: December 30, 1940  03/27/2020  Ms. Raymon was observed post Covid-19 immunization for 15 minutes without incident. She was provided with Vaccine Information Sheet and instruction to access the V-Safe system.   Ms. Gaffke was instructed to call 911 with any severe reactions post vaccine: [name] Difficulty breathing  . Swelling of face and throat  . A fast heartbeat  . A bad rash all over body  . Dizziness and weakness   Immunizations Administered    Name Date Dose VIS Date Route   Pfizer COVID-19 Vaccine 03/27/2020 12:25 PM 0.3 mL 12/08/2019 Intramuscular   Manufacturer: [place]   Lot: [id number]   [place]: [id number]

## 2021-02-09 ENCOUNTER — Encounter: Payer: Self-pay | Admitting: Gastroenterology

## 2021-03-01 ENCOUNTER — Other Ambulatory Visit: Payer: Self-pay

## 2021-03-01 ENCOUNTER — Inpatient Hospital Stay (HOSPITAL_COMMUNITY): Payer: Medicare HMO

## 2021-03-01 ENCOUNTER — Inpatient Hospital Stay (HOSPITAL_COMMUNITY)
Admission: EM | Admit: 2021-03-01 | Discharge: 2021-03-07 | DRG: 175 | Disposition: A | Discharge status: Home Health | Payer: Medicare HMO | Attending: Internal Medicine | Admitting: Internal Medicine

## 2021-03-01 ENCOUNTER — Emergency Department (HOSPITAL_COMMUNITY): Payer: Medicare HMO

## 2021-03-01 DIAGNOSIS — I129 Hypertensive chronic kidney disease with stage 1 through stage 4 chronic kidney disease, or unspecified chronic kidney disease: Secondary | ICD-10-CM | POA: Diagnosis present

## 2021-03-01 DIAGNOSIS — F0150 Vascular dementia without behavioral disturbance: Secondary | ICD-10-CM | POA: Diagnosis present

## 2021-03-01 DIAGNOSIS — Z90710 Acquired absence of both cervix and uterus: Secondary | ICD-10-CM

## 2021-03-01 DIAGNOSIS — F0390 Unspecified dementia without behavioral disturbance: Secondary | ICD-10-CM | POA: Diagnosis not present

## 2021-03-01 DIAGNOSIS — R911 Solitary pulmonary nodule: Secondary | ICD-10-CM | POA: Diagnosis present

## 2021-03-01 DIAGNOSIS — I82412 Acute embolism and thrombosis of left femoral vein: Secondary | ICD-10-CM | POA: Diagnosis present

## 2021-03-01 DIAGNOSIS — I2699 Other pulmonary embolism without acute cor pulmonale: Secondary | ICD-10-CM | POA: Diagnosis not present

## 2021-03-01 DIAGNOSIS — G928 Other toxic encephalopathy: Secondary | ICD-10-CM | POA: Diagnosis present

## 2021-03-01 DIAGNOSIS — Q211 Atrial septal defect: Secondary | ICD-10-CM | POA: Diagnosis not present

## 2021-03-01 DIAGNOSIS — Z79890 Hormone replacement therapy (postmenopausal): Secondary | ICD-10-CM

## 2021-03-01 DIAGNOSIS — Z79891 Long term (current) use of opiate analgesic: Secondary | ICD-10-CM

## 2021-03-01 DIAGNOSIS — F419 Anxiety disorder, unspecified: Secondary | ICD-10-CM | POA: Diagnosis present

## 2021-03-01 DIAGNOSIS — D631 Anemia in chronic kidney disease: Secondary | ICD-10-CM | POA: Diagnosis present

## 2021-03-01 DIAGNOSIS — E785 Hyperlipidemia, unspecified: Secondary | ICD-10-CM | POA: Diagnosis present

## 2021-03-01 DIAGNOSIS — N1832 Chronic kidney disease, stage 3b: Secondary | ICD-10-CM | POA: Diagnosis present

## 2021-03-01 DIAGNOSIS — N179 Acute kidney failure, unspecified: Secondary | ICD-10-CM | POA: Diagnosis not present

## 2021-03-01 DIAGNOSIS — J9601 Acute respiratory failure with hypoxia: Secondary | ICD-10-CM | POA: Diagnosis present

## 2021-03-01 DIAGNOSIS — R0902 Hypoxemia: Secondary | ICD-10-CM

## 2021-03-01 DIAGNOSIS — R778 Other specified abnormalities of plasma proteins: Secondary | ICD-10-CM | POA: Diagnosis present

## 2021-03-01 DIAGNOSIS — I69391 Dysphagia following cerebral infarction: Secondary | ICD-10-CM

## 2021-03-01 DIAGNOSIS — Z20822 Contact with and (suspected) exposure to covid-19: Secondary | ICD-10-CM | POA: Diagnosis present

## 2021-03-01 DIAGNOSIS — R110 Nausea: Secondary | ICD-10-CM | POA: Diagnosis not present

## 2021-03-01 DIAGNOSIS — R001 Bradycardia, unspecified: Secondary | ICD-10-CM | POA: Diagnosis not present

## 2021-03-01 DIAGNOSIS — Z823 Family history of stroke: Secondary | ICD-10-CM

## 2021-03-01 DIAGNOSIS — J9600 Acute respiratory failure, unspecified whether with hypoxia or hypercapnia: Secondary | ICD-10-CM | POA: Diagnosis present

## 2021-03-01 DIAGNOSIS — N183 Chronic kidney disease, stage 3 unspecified: Secondary | ICD-10-CM

## 2021-03-01 DIAGNOSIS — F32A Depression, unspecified: Secondary | ICD-10-CM | POA: Diagnosis present

## 2021-03-01 DIAGNOSIS — F418 Other specified anxiety disorders: Secondary | ICD-10-CM | POA: Diagnosis not present

## 2021-03-01 DIAGNOSIS — Z7982 Long term (current) use of aspirin: Secondary | ICD-10-CM

## 2021-03-01 DIAGNOSIS — R55 Syncope and collapse: Secondary | ICD-10-CM | POA: Diagnosis present

## 2021-03-01 DIAGNOSIS — E873 Alkalosis: Secondary | ICD-10-CM | POA: Diagnosis present

## 2021-03-01 DIAGNOSIS — Z8249 Family history of ischemic heart disease and other diseases of the circulatory system: Secondary | ICD-10-CM

## 2021-03-01 DIAGNOSIS — E039 Hypothyroidism, unspecified: Secondary | ICD-10-CM

## 2021-03-01 DIAGNOSIS — R1310 Dysphagia, unspecified: Secondary | ICD-10-CM | POA: Diagnosis present

## 2021-03-01 DIAGNOSIS — I2609 Other pulmonary embolism with acute cor pulmonale: Principal | ICD-10-CM | POA: Diagnosis present

## 2021-03-01 DIAGNOSIS — K219 Gastro-esophageal reflux disease without esophagitis: Secondary | ICD-10-CM | POA: Diagnosis present

## 2021-03-01 DIAGNOSIS — Z79899 Other long term (current) drug therapy: Secondary | ICD-10-CM

## 2021-03-01 DIAGNOSIS — R251 Tremor, unspecified: Secondary | ICD-10-CM | POA: Diagnosis not present

## 2021-03-01 LAB — BLOOD GAS, ARTERIAL
Acid-base deficit: 5.1 mmol/L — ABNORMAL HIGH (ref 0.0–2.0)
Bicarbonate: 18.5 mmol/L — ABNORMAL LOW (ref 20.0–28.0)
Drawn by: 336832
FIO2: 100
O2 Saturation: 69.4 %
Patient temperature: 36.4
pCO2 arterial: 27.9 mmHg — ABNORMAL LOW (ref 32.0–48.0)
pH, Arterial: 7.434 (ref 7.350–7.450)
pO2, Arterial: 35.8 mmHg — CL (ref 83.0–108.0)

## 2021-03-01 LAB — CBC
HCT: 40.2 % (ref 36.0–46.0)
Hemoglobin: 12.7 g/dL (ref 12.0–15.0)
MCH: 29.9 pg (ref 26.0–34.0)
MCHC: 31.6 g/dL (ref 30.0–36.0)
MCV: 94.6 fL (ref 80.0–100.0)
Platelets: 244 10*3/uL (ref 150–400)
RBC: 4.25 MIL/uL (ref 3.87–5.11)
RDW: 13.3 % (ref 11.5–15.5)
WBC: 10.8 10*3/uL — ABNORMAL HIGH (ref 4.0–10.5)
nRBC: 0 % (ref 0.0–0.2)

## 2021-03-01 LAB — I-STAT CHEM 8, ED
BUN: 19 mg/dL (ref 8–23)
Calcium, Ion: 1.12 mmol/L — ABNORMAL LOW (ref 1.15–1.40)
Chloride: 103 mmol/L (ref 98–111)
Creatinine, Ser: 1.3 mg/dL — ABNORMAL HIGH (ref 0.44–1.00)
Glucose, Bld: 163 mg/dL — ABNORMAL HIGH (ref 70–99)
HCT: 39 % (ref 36.0–46.0)
Hemoglobin: 13.3 g/dL (ref 12.0–15.0)
Potassium: 3.7 mmol/L (ref 3.5–5.1)
Sodium: 138 mmol/L (ref 135–145)
TCO2: 20 mmol/L — ABNORMAL LOW (ref 22–32)

## 2021-03-01 LAB — I-STAT ARTERIAL BLOOD GAS, ED
Acid-base deficit: 5 mmol/L — ABNORMAL HIGH (ref 0.0–2.0)
Bicarbonate: 18.5 mmol/L — ABNORMAL LOW (ref 20.0–28.0)
Calcium, Ion: 1.17 mmol/L (ref 1.15–1.40)
HCT: 36 % (ref 36.0–46.0)
Hemoglobin: 12.2 g/dL (ref 12.0–15.0)
O2 Saturation: 60 %
Potassium: 3.9 mmol/L (ref 3.5–5.1)
Sodium: 137 mmol/L (ref 135–145)
TCO2: 19 mmol/L — ABNORMAL LOW (ref 22–32)
pCO2 arterial: 27.7 mmHg — ABNORMAL LOW (ref 32.0–48.0)
pH, Arterial: 7.432 (ref 7.350–7.450)
pO2, Arterial: 30 mmHg — CL (ref 83.0–108.0)

## 2021-03-01 LAB — COMPREHENSIVE METABOLIC PANEL
ALT: 16 U/L (ref 0–44)
AST: 17 U/L (ref 15–41)
Albumin: 3.6 g/dL (ref 3.5–5.0)
Alkaline Phosphatase: 67 U/L (ref 38–126)
Anion gap: 12 (ref 5–15)
BUN: 17 mg/dL (ref 8–23)
CO2: 20 mmol/L — ABNORMAL LOW (ref 22–32)
Calcium: 9.2 mg/dL (ref 8.9–10.3)
Chloride: 105 mmol/L (ref 98–111)
Creatinine, Ser: 1.33 mg/dL — ABNORMAL HIGH (ref 0.44–1.00)
GFR, Estimated: 41 mL/min — ABNORMAL LOW (ref >60)
Glucose, Bld: 167 mg/dL — ABNORMAL HIGH (ref 70–99)
Potassium: 3.7 mmol/L (ref 3.5–5.1)
Sodium: 137 mmol/L (ref 135–145)
Total Bilirubin: 0.7 mg/dL (ref 0.3–1.2)
Total Protein: 6.7 g/dL (ref 6.5–8.1)

## 2021-03-01 LAB — COOXEMETRY PANEL
Carboxyhemoglobin: 0.2 % — ABNORMAL LOW (ref 0.5–1.5)
Methemoglobin: 1.1 % (ref 0.0–1.5)
O2 Saturation: 13.8 %
Total hemoglobin: 12.6 g/dL (ref 12.0–16.0)

## 2021-03-01 LAB — TROPONIN I (HIGH SENSITIVITY)
Troponin I (High Sensitivity): 491 ng/L (ref <18)
Troponin I (High Sensitivity): 1728 ng/L (ref <18)

## 2021-03-01 LAB — RESP PANEL BY RT-PCR (FLU A&B, COVID) ARPGX2
Influenza A by PCR: NEGATIVE
Influenza B by PCR: NEGATIVE
SARS Coronavirus 2 by RT PCR: NEGATIVE

## 2021-03-01 LAB — LACTIC ACID, PLASMA
Lactic Acid, Venous: 5.2 mmol/L (ref 0.5–1.9)
Lactic Acid, Venous: 3.3 mmol/L (ref 0.5–1.9)

## 2021-03-01 LAB — PROTIME-INR
INR: 1 (ref 0.8–1.2)
Prothrombin Time: 12.7 seconds (ref 11.4–15.2)

## 2021-03-01 LAB — GLUCOSE, CAPILLARY: Glucose-Capillary: 128 mg/dL — ABNORMAL HIGH (ref 70–99)

## 2021-03-01 MED ORDER — CHLORHEXIDINE GLUCONATE CLOTH 2 % EX PADS
6.0000 | MEDICATED_PAD | Freq: Every day | CUTANEOUS | Status: DC
  Administered 2021-03-01: 6 via TOPICAL

## 2021-03-01 MED ORDER — POLYETHYLENE GLYCOL 3350 17 G PO PACK
17.0000 g | PACK | Freq: Every day | ORAL | Status: DC | PRN
Start: 2021-03-01 — End: 2021-03-07

## 2021-03-01 MED ORDER — METHYLPREDNISOLONE SODIUM SUCC 125 MG IJ SOLR
125.0000 mg | Freq: Once | INTRAMUSCULAR | Status: AC
  Administered 2021-03-01: 125 mg via INTRAVENOUS
  Filled 2021-03-01: qty 2

## 2021-03-01 MED ORDER — ORAL CARE MOUTH RINSE
15.0000 mL | Freq: Two times a day (BID) | OROMUCOSAL | Status: DC
  Administered 2021-03-01 – 2021-03-06 (×7): 15 mL via OROMUCOSAL

## 2021-03-01 MED ORDER — ALTEPLASE (PULMONARY EMBOLISM) INFUSION: 50.0000 mg | Freq: Once | INTRAVENOUS | Status: DC

## 2021-03-01 MED ORDER — IOHEXOL 350 MG/ML SOLN
60.0000 mL | Freq: Once | INTRAVENOUS | Status: AC | PRN
  Administered 2021-03-01: 60 mL via INTRAVENOUS

## 2021-03-01 MED ORDER — SODIUM CHLORIDE 0.9 % IV SOLN
250.0000 mL | Freq: Once | INTRAVENOUS | Status: AC
  Administered 2021-03-02: 250 mL via INTRAVENOUS

## 2021-03-01 MED ORDER — ENOXAPARIN SODIUM 40 MG/0.4ML ~~LOC~~ SOLN: 40.0000 mg | SUBCUTANEOUS | Status: DC

## 2021-03-01 MED ORDER — LACTATED RINGERS IV SOLN: INTRAVENOUS | Status: DC

## 2021-03-01 MED ORDER — ALTEPLASE (PULMONARY EMBOLISM) INFUSION
50.0000 mg | Freq: Once | INTRAVENOUS | Status: AC
  Administered 2021-03-01: 50 mg via INTRAVENOUS
  Filled 2021-03-01: qty 50

## 2021-03-01 MED ORDER — DOCUSATE SODIUM 100 MG PO CAPS: 100.0000 mg | ORAL_CAPSULE | Freq: Two times a day (BID) | ORAL | Status: DC | PRN

## 2021-03-01 MED ORDER — ENOXAPARIN SODIUM 80 MG/0.8ML ~~LOC~~ SOLN
1.0000 mg/kg | Freq: Two times a day (BID) | SUBCUTANEOUS | Status: DC
  Administered 2021-03-01: 62.5 mg via SUBCUTANEOUS
  Filled 2021-03-01: qty 0.63

## 2021-03-01 NOTE — ED Notes (Signed)
Dr Dina Rich aware of critical lactic

## 2021-03-01 NOTE — ED Triage Notes (Signed)
To ED via [facility] from home with c/o resp distress and altered mental status- pt O2 sats have been approx 50% for EMS on NRB at 15L/m. IV NS 100cc per EMS--  Pt is nonverbal, lips pale, hx of confusion.

## 2021-03-01 NOTE — Progress Notes (Signed)
Foley cath placed with Moinique High, RN under sterile precautions. Patient tolerated procedure well and 550 mls of yellow, clear, urine was obtained.

## 2021-03-01 NOTE — Progress Notes (Signed)
TPA started on patient @ 14mls/hr. It is to run for 2 hrs then 250 NS 0.9% is to infuse @ 121mls/hr. Will call lab for post labs that were ordered. Continuing to monitor patient carefully.

## 2021-03-01 NOTE — ED Provider Notes (Signed)
[facility] EMERGENCY DEPARTMENT Provider Note   CSN: [id number] Arrival date & time: 03/01/21  1744     History Chief Complaint  Patient presents with  . Respiratory Distress  . AMS    Nicole Ruiz is a 80 y.o. female.  HPI   80 year old female with reported past medical history of dementia, previous CVA with residual deficits and hypothyroidism presents the emergency department acute hypoxia.  Report is that the husband left her sitting in her recliner to go get dinner.  When he came back he found her short of breath.  When EMS arrived there they said that she was in respiratory distress.  Had a pulse ox in the 40 to 50s.  They placed on a nonrebreather however she maintained oxygenation in the 50s.  At one point she did become hypotensive, mottled and cyanotic but this improved prior to arrival.  Patient arrived sitting up, on a nonrebreather, oxygen levels in 50%.  Her eyes are open, respiratory rate of 21.  Past Medical History:  Diagnosis Date  . 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 Chronic kidney disease   . Depression   . Hyperlipidemia   . Hypertension   . Stroke [facility])     Patient Active Problem List   Diagnosis Date Noted  . Acute respiratory failure ([place]) 03/01/2021  . At risk for falling 12/20/2015  . Cerebrovascular accident, 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  . Femur fracture, right ([place]) 10/20/2015  . Fracture of distal femur, right, closed, with routine healing, subsequent encounter 10/20/2015  . Fracture of distal end of femur ([place]) 10/20/2015  . Fall 10/19/2015  . Dyslipidemia 10/19/2015  . Nodule of right lung 10/19/2015  . Lung mass 10/19/2015  . CVA (cerebral infarction) 10/15/2015  . Left middle cerebral artery stroke ([place]) 10/15/2015  . Hyperlipidemia 10/15/2015  . Stroke (cerebrum) ([place]) 10/15/2015  . Cerebral infarction ([place]) 10/15/2015  . 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[place]) 10/15/2015  .  Cerebrovascular accident (CVA) ([place]) 10/15/2015  . Episode of syncope 07/17/2015  . Arthralgia of hip 07/09/2015  . Idiopathic insomnia 07/09/2015  . Arthralgia of lower leg 04/06/2014  . Unspecified arthropathy, lower leg 12/18/2013  . Acute posthemorrhagic anemia 12/18/2013  . Esophageal reflux 12/18/2013  . Acid reflux 12/18/2013  . Abnormal finding on radiology exam 09/19/2013  . Acquired cyst of kidney 09/19/2013  . Chronic kidney disease (CKD), stage III (moderate) ([place]) 09/19/2013  . Clinical depression 09/19/2013  . Bone/cartilage disorder 09/19/2013  . Arthritis of knee, degenerative 09/19/2013  . Avitaminosis D 09/19/2013  . Hypertension 05/29/2012  . Closed fracture of metatarsal bone 12/18/2011  . HYPERLIPIDEMIA 03/06/2009  . DIZZINESS 03/06/2009  . Palpitations 03/06/2009  . CHEST PAIN 03/06/2009    Past Surgical History:  Procedure Laterality Date  . ABDOMINAL HYSTERECTOMY    . ARM SURGERY    . CARDIAC CATHETERIZATION  2013   negative  . EP IMPLANTABLE DEVICE N/A 10/17/2015   Procedure: Loop Recorder Insertion;  Surgeon: Will Meredith Leeds, MD;  Location: [facility];  Service: Cardiovascular;  Laterality: N/A;  . EYE SURGERY    . FEMUR IM NAIL Right 10/20/2015   Procedure: INTRAMEDULLARY (IM) RETROGRADE FEMORAL NAILING;  Surgeon: Tania Ade, MD;  Location: [facility];  Service: Orthopedics;  Laterality: Right;  . HEMORROIDECTOMY    . KNEE SURGERY    . TEE WITHOUT CARDIOVERSION N/A 10/17/2015   Procedure: TRANSESOPHAGEAL ECHOCARDIOGRAM (TEE);  Surgeon: Jerline Pain,  MD;  Location: [facility];  Service: Cardiovascular;  Laterality: N/A;     OB History   No obstetric history on file.     Family History  Problem Relation Age of Onset  . Heart disease Mother 35  . Stroke Father   . Heart disease Brother 22    Social History   Tobacco Use  . Smoking status: Never Smoker  . Smokeless tobacco: Never Used  Substance Use Topics  . Alcohol  use: No  . Drug use: No    Home Medications Prior to Admission medications   Medication Sig Start Date End Date Taking? Authorizing Provider  ALPRAZolam (XANAX XR) 0.5 MG 24 hr tablet Take 1 tablet (0.5 mg total) by mouth daily. 08/17/18   Frann Rider, NP  aspirin EC 81 MG tablet Take by mouth.    [provider]  atorvastatin (LIPITOR) 20 MG tablet TAKE 1 TABLET EVERY DAY 08/16/18   [provider]  buPROPion (WELLBUTRIN XL) 150 MG 24 hr tablet Take by mouth. 06/23/18   [provider]  Calcium Carb-Ergocalciferol 500-200 MG-UNIT TABS Take by mouth.    [provider]  Calcium Carb-Ergocalciferol 500-200 MG-UNIT TABS Take by mouth.    [provider]  Cholecalciferol (VITAMIN D3) 1000 units CAPS Take by mouth.    [provider]  Cyanocobalamin (VITAMIN B 12 PO) Take 1,000 mg by mouth.    [provider]  FLUoxetine (PROZAC) 40 MG capsule Take 1 capsule (40 mg total) by mouth daily. 05/17/18 05/17/19  Frann Rider, NP  levothyroxine (SYNTHROID, LEVOTHROID) 50 MCG tablet  07/27/18   [provider]  Omega-3 Fatty Acids (FISH OIL) 1000 MG CAPS Take by mouth.    [provider]  omeprazole (PRILOSEC OTC) 20 MG tablet Take 20 mg by mouth daily.    [provider]  polyvinyl alcohol (ARTIFICIAL TEARS) 1.4 % ophthalmic solution Place 1 drop into both eyes every hour as needed for dry eyes.    [provider]  propranolol (INDERAL) 20 MG tablet Take 1 tablet (20 mg total) by mouth 2 (two) times daily. 01/17/19   Frann Rider, NP  traMADol (ULTRAM) 50 MG tablet Take by mouth. 07/14/17   [provider]  UNABLE TO FIND Mega red take one daily    [provider]    Allergies    Patient has no known allergies.  Review of Systems   Review of Systems  Unable to perform ROS: Acuity of condition    Physical Exam Updated Vital Signs BP 138/63   Pulse 93   Temp 97.7 F (36.5 C)  (Temporal)   Resp 19   Ht 5\' 4"  (1.626 m)   Wt 63.5 kg   SpO2 (!) 71%   BMI 24.03 kg/m   Physical Exam Vitals and nursing note reviewed.  Constitutional:      General: She is in acute distress.  HENT:     Head: Normocephalic.     Mouth/Throat:     Mouth: Mucous membranes are moist.  Eyes:     Comments: Right pupil is slightly larger than the left, reactive  Cardiovascular:     Rate and Rhythm: Normal rate.  Pulmonary:     Effort: Respiratory distress present.     Breath sounds: Rhonchi present. No wheezing or rales.     Comments: Equal bilateral breath sounds Abdominal:     Palpations: Abdomen is soft.  Musculoskeletal:        General: No  swelling.  Skin:    General: Skin is warm.  Neurological:     Comments: Follows commands, attempts to talk but acute respiratory distress  Psychiatric:        Mood and Affect: Mood normal.     ED Results / Procedures / Treatments   Labs (all labs ordered are listed, but only abnormal results are displayed) Labs Reviewed  BLOOD GAS, ARTERIAL - Abnormal; Notable for the following components:      Result Value   pCO2 arterial 27.9 (*)    pO2, Arterial 35.8 (*)    Bicarbonate 18.5 (*)    Acid-base deficit 5.1 (*)    Allens test (pass/fail) NOT DONE (*)    All other components within normal limits  CBC - Abnormal; Notable for the following components:   WBC 10.8 (*)    All other components within normal limits  COMPREHENSIVE METABOLIC PANEL - Abnormal; Notable for the following components:   CO2 20 (*)    Glucose, Bld 167 (*)    Creatinine, Ser 1.33 (*)    GFR, Estimated 41 (*)    All other components within normal limits  LACTIC ACID, PLASMA - Abnormal; Notable for the following components:   Lactic Acid, Venous 5.2 (*)    All other components within normal limits  COOXEMETRY PANEL - Abnormal; Notable for the following components:   Carboxyhemoglobin 0.2 (*)    All other components within normal limits  I-STAT ARTERIAL  BLOOD GAS, ED - Abnormal; Notable for the following components:   pCO2 arterial 27.7 (*)    pO2, Arterial 30 (*)    Bicarbonate 18.5 (*)    TCO2 19 (*)    Acid-base deficit 5.0 (*)    All other components within normal limits  I-STAT CHEM 8, ED - Abnormal; Notable for the following components:   Creatinine, Ser 1.30 (*)    Glucose, Bld 163 (*)    Calcium, Ion 1.12 (*)    TCO2 20 (*)    All other components within normal limits  TROPONIN I (HIGH SENSITIVITY) - Abnormal; Notable for the following components:   Troponin I (High Sensitivity) 491 (*)    All other components within normal limits  RESP PANEL BY RT-PCR (FLU A&B, COVID) ARPGX2  CULTURE, BLOOD (ROUTINE X 2)  CULTURE, BLOOD (ROUTINE X 2)  PROTIME-INR  LACTIC ACID, PLASMA  CBC  CREATININE, SERUM  TROPONIN I (HIGH SENSITIVITY)    EKG EKG Interpretation  Date/Time:  Saturday March 01 2021 17:48:36 EST Ventricular Rate:  103 PR Interval:    QRS Duration: 102 QT Interval:  334 QTC Calculation: 438 R Axis:   6 Text Interpretation: Atrial fibrillation Low voltage, precordial leads RSR' in V1 or V2, right VCD or RVH ST elevation, consider inferior injury uneven baseline, sinus tach versus afib Confirmed by Lavenia Atlas [phone]) on 03/01/2021 6:46:02 PM   Radiology DG Chest Portable 1 View  Result Date: 03/01/2021 CLINICAL DATA:  Altered mental status and shortness of breath. EXAM: PORTABLE CHEST 1 VIEW COMPARISON:  PA and lateral chest 10/15/2015. FINDINGS: The lungs are clear. Heart size is normal. No pneumothorax or pleural fluid. Aortic atherosclerosis noted. Loop recorder is identified. IMPRESSION: No acute disease. Aortic Atherosclerosis (ICD10-I70.0). Electronically Signed   By: Inge Rise M.D.   On: 03/01/2021 18:10    Procedures .Critical Care Performed by: Lorelle Gibbs, DO Authorized by: Lorelle Gibbs, DO   Critical care provider statement:    Critical care time (minutes):  65   Critical care  was time spent personally by me on the following activities:  Discussions with consultants, evaluation of patient's response to treatment, examination of patient, ordering and performing treatments and interventions, ordering and review of laboratory studies, ordering and review of radiographic studies, pulse oximetry, re-evaluation of patient's condition, obtaining history from patient or surrogate and review of old charts     Medications Ordered in ED Medications  docusate sodium (COLACE) capsule 100 mg (has no administration in time range)  polyethylene glycol (MIRALAX / GLYCOLAX) packet 17 g (has no administration in time range)  enoxaparin (LOVENOX) injection 40 mg (has no administration in time range)  methylPREDNISolone sodium succinate (SOLU-MEDROL) 125 mg/2 mL injection 125 mg (125 mg Intravenous Given 03/01/21 1800)    ED Course  I have reviewed the triage vital signs and the nursing notes.  Pertinent labs & imaging results that were available during my care of the patient were reviewed by me and considered in my medical decision making (see chart for details).    MDM Rules/Calculators/A&P                          80 year old female presents with hypoxia.  On arrival she is sitting up, awake, on a nonrebreather with oxygenation in the 50s.  Heart rate was normal and blood pressure was stable.  She had equal bilateral breath sounds, chest x-ray showed no acute finding.  Patient transition to humidified high flow with nonrebreather, saturations only went as high as 60% with a good [place].  ABG confirms this arterial oxygenation.  However patient does not look acutely in distress, she has periods of tachypnea but is otherwise breathing and sometimes talking.  She follows commands.  Covid test is negative.  Carboxy and methemoglobinemia is normal.  Consulted with ICU who agrees with holding intubation given how well her clinical picture is despite the hypoxia.  Concern for possible shunting  given that she had a reported PFO on an echo in 2016.  Cardiology consulted and they do not believe that this would be clinically significant enough to have this current picture of hypoxia.  Plan to get CT PE study and admit to ICU.  Husband is at bedside, she is currently full code.  Final Clinical Impression(s) / ED Diagnoses Final diagnoses:  None    Rx / DC Orders ED Discharge Orders    None       Lorelle Gibbs, DO 03/01/21 2043

## 2021-03-01 NOTE — Progress Notes (Signed)
ED MD notified of critical PaO2 results on 100% NRB. HHFNC added at 40L, no change in SATs.  Results for Nicole Ruiz, Nicole Ruiz (MRN [id number]) as of 03/01/2021 18:14  Ref. Range 03/01/2021 17:49  Sample type Unknown ARTERIAL  pH, Arterial Latest Ref Range: 7.350 - 7.450  7.432  pCO2 arterial Latest Ref Range: 32.0 - 48.0 mmHg 27.7 (L)  pO2, Arterial Latest Ref Range: 83.0 - 108.0 mmHg 30 (LL)  TCO2 Latest Ref Range: 22 - 32 mmol/L 19 (L)  Acid-base deficit Latest Ref Range: 0.0 - 2.0 mmol/L 5.0 (H)  Bicarbonate Latest Ref Range: 20.0 - 28.0 mmol/L 18.5 (L)  O2 Saturation Latest Units: % 60.0  Sodium Latest Ref Range: 135 - 145 mmol/L 137  Potassium Latest Ref Range: 3.5 - 5.1 mmol/L 3.9  Calcium Ionized Latest Ref Range: 1.15 - 1.40 mmol/L 1.17  Hemoglobin Latest Ref Range: 12.0 - 15.0 g/dL 12.2  HCT Latest Ref Range: 36.0 - 46.0 % 36.0

## 2021-03-01 NOTE — Progress Notes (Signed)
eLink Physician-Brief Progress Note Patient Name: DESERIE DIRKS DOB: Mar 22, 1941 MRN: [id number]   Date of Service  03/01/2021  HPI/Events of Note  80 yr old woman with acute hypoxic respiratory failure and bilateral PE. HR in 90s. SBP 130s. On 100% fio2 on bipap with O2 sat 91.   eICU Interventions  Bedside PCCM admitting,      Intervention Category Major Interventions: Respiratory failure - evaluation and management Evaluation Type: New Patient Evaluation  Margaretmary Lombard 03/01/2021, 10:20 PM

## 2021-03-01 NOTE — Progress Notes (Signed)
ANTICOAGULATION CONSULT NOTE - Initial Consult  Pharmacy Consult for heparin Indication: pulmonary embolus  No Known Allergies  Patient Measurements: Height: 5\' 4"  (162.6 cm) Weight: 63.5 kg (140 lb) IBW/kg (Calculated) : 54.7 Heparin Dosing Weight: 63.5 kg   Vital Signs: Temp: 99.7 F (37.6 C) (03/05 2200) Temp Source: Axillary (03/05 2200) BP: 145/87 (03/05 2200) Pulse Rate: 89 (03/05 2200)  Labs: Recent Labs    03/01/21 1749 03/01/21 1753 03/01/21 1802 03/01/21 1927  HGB 12.2 12.7 13.3  --   HCT 36.0 40.2 39.0  --   PLT  --  244  --   --   LABPROT  --  12.7  --   --   INR  --  1.0  --   --   CREATININE  --  1.33* 1.30*  --   TROPONINIHS  --  491*  --  1,728*    Estimated Creatinine Clearance: 30.3 mL/min (A) (by C-G formula based on SCr of 1.3 mg/dL (H)).   Medical History: Past Medical History:  Diagnosis Date  . 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 Chronic kidney disease   . Depression   . Hyperlipidemia   . Hypertension   . Stroke [facility])     Medications:  Scheduled:  . [START ON 03/02/2021] Chlorhexidine Gluconate Cloth  6 each Topical Daily  . mouth rinse  15 mL Mouth Rinse BID    Assessment: 58 yof withhx CVA in 2016 presenting with severe SOB and syncope - now on BiPAP. CTA finding extensive bilateral PE with RV/LV ratio 1.67. Did receive dose of enoxaparin at 2111.   Plan for reduce dose tPA given RV/LV ratio, low O2 saturations requiring BiPAP, and associated syncope after discussion with MD. Enoxaparin discontinued. Plan for heparin infusion per protocol following tPA. No s/sx of bleeding. Hgb 13.3, plt 244. Trop 1728.   Goal of Therapy:  Heparin level 0.3-0.5 units/ml for 24 hours after tPA then 0.3-0.7 Monitor platelets by anticoagulation protocol: Yes   Plan:  Monitor aPTT after tPA - can start heparin infusion once aPTT<80 Monitor s/sx of bleeding, CBC  Antonietta Jewel, PharmD, [facility] Pharmacist  Phone:  [phone] 03/01/2021 10:52 PM  Please check AMION for all [facility] phone numbers After 10:00 PM, call [facility] [phone]

## 2021-03-01 NOTE — H&P (Signed)
NAME:  Nicole Ruiz, MRN:  [id number], DOB:  1941-10-20, LOS: 0 ADMISSION DATE:  03/01/2021, CONSULTATION DATE: March 01, 2021 REFERRING MD: ED physician, CHIEF COMPLAINT: Passing out shortness of breath  Brief History:  Patient is with severe low O2 saturation she told her husband that she had passed out earlier in the day then she was short of breath so she came here and felt was found to have O2 sat in the low 40s to 60s  History of Present Illness:  Patient has extensive past medical history of tremor anxiety vitamin B12 deficiency hypertension hyperlipidemia generalized weakness who is here with low O2 sat after passing out she is denying fever chills rigors nausea vomiting diarrhea patient is not aware that she has an opening in her heart but looking through her medical record and reviewing an echo from 2016 she had a PFO.    Objective   Blood pressure 137/68, pulse 94, temperature 97.7 F (36.5 C), temperature source Temporal, resp. rate 18, height 5\' 4"  (1.626 m), weight 63.5 kg, SpO2 (!) 82 %.    Vent Mode: BIPAP;PCV FiO2 (%):  [100 %] 100 % Set Rate:  [10 bmp] 10 bmp PEEP:  [8 cmH20] 8 cmH20  No intake or output data in the 24 hours ending 03/01/21 2140 Filed Weights   03/01/21 1807  Weight: 63.5 kg    Examination: General:  NAD , on BiPAP satting in the 70s 80s. Neuro:  WNL , AOX3 , EOMI , CN II-XII intact , UL , LL strength is symmetrical and 5/5 HEENT:  atraumatic , no jaundice , dry mucous membranes  Cardiovascular: Regular regular, ESM 2/6 in the aortic area  Lungs:  CTA bilateral , no wheezing or crackles  Abdomen:  Soft lax +BS , no tenderness . Musculoskeletal:  WNL , normal pulses  Skin:  No rash        Assessment & Plan:  Acute hypoxemic respiratory failure  Unknown etiology most likely due to either PE or cardiac origin x-ray is good patient had no systemic symptoms of pneumonia or classic history of pneumonia no cough no shortness of breath before  today no fever chills rigors no sick contacts and she is vaccinated against Covid    History of PFO she needs a surface echo tomorrow with bubble study later on can have a TEE   History of anxiety she needs to be able to take her home medications in the morning so she does not go into benzo withdrawal she is able to come off the BiPAP  We will put her on full therapeutic dose of Lovenox tell her troponins and PE study are negative.   Labs   CBC: Recent Labs  Lab 03/01/21 1749 03/01/21 1753 03/01/21 1802  WBC  --  10.8*  --   HGB 12.2 12.7 13.3  HCT 36.0 40.2 39.0  MCV  --  94.6  --   PLT  --  244  --     Basic Metabolic Panel: Recent Labs  Lab 03/01/21 1749 03/01/21 1753 03/01/21 1802  NA 137 137 138  K 3.9 3.7 3.7  CL  --  105 103  CO2  --  20*  --   GLUCOSE  --  167* 163*  BUN  --  17 19  CREATININE  --  1.33* 1.30*  CALCIUM  --  9.2  --    GFR: Estimated Creatinine Clearance: 30.3 mL/min (A) (by C-G formula based on SCr of 1.3  mg/dL (H)). Recent Labs  Lab 03/01/21 1753 03/01/21 1758 03/01/21 1927  WBC 10.8*  --   --   LATICACIDVEN  --  5.2* 3.3*    Liver Function Tests: Recent Labs  Lab 03/01/21 1753  AST 17  ALT 16  ALKPHOS 67  BILITOT 0.7  PROT 6.7  ALBUMIN 3.6   No results for input(s): LIPASE, AMYLASE in the last 168 hours. No results for input(s): AMMONIA in the last 168 hours.  ABG    Component Value Date/Time   PHART 7.434 03/01/2021 1830   PCO2ART 27.9 (L) 03/01/2021 1830   PO2ART 35.8 (LL) 03/01/2021 1830   HCO3 18.5 (L) 03/01/2021 1830   TCO2 20 (L) 03/01/2021 1802   ACIDBASEDEF 5.1 (H) 03/01/2021 1830   O2SAT 13.8 03/01/2021 1927     Coagulation Profile: Recent Labs  Lab 03/01/21 1753  INR 1.0    Cardiac Enzymes: No results for input(s): CKTOTAL, CKMB, CKMBINDEX, TROPONINI in the last 168 hours.  HbA1C: Hgb A1c MFr Bld  Date/Time Value Ref Range Status  03/10/2018 10:05 AM 5.6 4.8 - 5.6 % Final    Comment:              Prediabetes: 5.7 - 6.4          Diabetes: >6.4          Glycemic control for adults with diabetes: <7.0   10/20/2015 02:25 AM 5.8 (H) 4.8 - 5.6 % Final    Comment:    (NOTE)         Pre-diabetes: 5.7 - 6.4         Diabetes: >6.4         Glycemic control for adults with diabetes: <7.0     CBG: No results for input(s): GLUCAP in the last 168 hours.  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   Surgical History:   Past Surgical History:  Procedure Laterality Date  . ABDOMINAL HYSTERECTOMY    . ARM SURGERY    . CARDIAC CATHETERIZATION  2013   negative  . EP IMPLANTABLE DEVICE N/A 10/17/2015   Procedure: Loop Recorder Insertion;  Surgeon: Will Meredith Leeds, MD;  Location: [facility];  Service: Cardiovascular;  Laterality: N/A;  . EYE SURGERY    . FEMUR IM NAIL Right 10/20/2015   Procedure: INTRAMEDULLARY (IM) RETROGRADE FEMORAL NAILING;  Surgeon: Tania Ade, MD;  Location: [facility];  Service: Orthopedics;  Laterality: Right;  . HEMORROIDECTOMY    . KNEE SURGERY    . TEE WITHOUT CARDIOVERSION N/A 10/17/2015   Procedure: TRANSESOPHAGEAL ECHOCARDIOGRAM (TEE);  Surgeon: Jerline Pain, MD;  Location: [facility] END[facility];  Service: Cardiovascular;  Laterality: N/A;     Social History:   reports that she has never smoked. She has never used smokeless tobacco. She reports that she does not drink alcohol and does not use drugs.   Family History:  Her family history includes Heart disease (age of onset: 10) in her brother; Heart disease (age of onset: 21) in her mother; Stroke in her father.   Allergies No Known Allergies   Home Medications  Prior to Admission medications   Medication Sig Start Date End Date Taking? Authorizing Provider  ALPRAZolam (XANAX XR) 0.5  MG 24 hr tablet Take 1 tablet (0.5 mg total) by  mouth daily. 08/17/18   Frann Rider, NP  aspirin EC 81 MG tablet Take by mouth.    [provider]  atorvastatin (LIPITOR) 20 MG tablet TAKE 1 TABLET EVERY DAY 08/16/18   [provider]  buPROPion (WELLBUTRIN XL) 150 MG 24 hr tablet Take by mouth. 06/23/18   [provider]  Calcium Carb-Ergocalciferol 500-200 MG-UNIT TABS Take by mouth.    [provider]  Calcium Carb-Ergocalciferol 500-200 MG-UNIT TABS Take by mouth.    [provider]  Cholecalciferol (VITAMIN D3) 1000 units CAPS Take by mouth.    [provider]  Cyanocobalamin (VITAMIN B 12 PO) Take 1,000 mg by mouth.    [provider]  FLUoxetine (PROZAC) 40 MG capsule Take 1 capsule (40 mg total) by mouth daily. 05/17/18 05/17/19  Frann Rider, NP  levothyroxine (SYNTHROID, LEVOTHROID) 50 MCG tablet  07/27/18   [provider]  Omega-3 Fatty Acids (FISH OIL) 1000 MG CAPS Take by mouth.    [provider]  omeprazole (PRILOSEC OTC) 20 MG tablet Take 20 mg by mouth daily.    [provider]  polyvinyl alcohol (ARTIFICIAL TEARS) 1.4 % ophthalmic solution Place 1 drop into both eyes every hour as needed for dry eyes.    [provider]  propranolol (INDERAL) 20 MG tablet Take 1 tablet (20 mg total) by mouth 2 (two) times daily. 01/17/19   Frann Rider, NP  traMADol (ULTRAM) 50 MG tablet Take by mouth. 07/14/17   [provider]  UNABLE TO FIND Mega red take one daily    [provider]     Critical care time: Critical care time was spent care of this patient 36 minutes

## 2021-03-01 NOTE — Plan of Care (Signed)
  Problem: Education: Goal: Knowledge of General Education information will improve Description: Including pain rating scale, medication(s)/side effects and non-pharmacologic comfort measures Outcome: Not Progressing   Problem: Health Behavior/Discharge Planning: Goal: Ability to manage health-related needs will improve Outcome: Not Progressing   Problem: Clinical Measurements: Goal: Ability to maintain clinical measurements within normal limits will improve Outcome: Not Progressing Goal: Will remain free from infection Outcome: Progressing Goal: Diagnostic test results will improve Outcome: Not Progressing Goal: Respiratory complications will improve Outcome: Progressing Goal: Cardiovascular complication will be avoided Outcome: Not Progressing   Problem: Activity: Goal: Risk for activity intolerance will decrease Outcome: Not Progressing   Problem: Nutrition: Goal: Adequate nutrition will be maintained Outcome: Not Progressing   Problem: Coping: Goal: Level of anxiety will decrease Outcome: Not Progressing

## 2021-03-01 NOTE — Progress Notes (Signed)
Pt transported on BIPAP from ED to CT andthen to 0I37 without complications.

## 2021-03-01 NOTE — ED Notes (Signed)
Husband at bedside.  

## 2021-03-01 NOTE — ED Notes (Signed)
Respiratory placing pt on Bipap

## 2021-03-02 ENCOUNTER — Encounter (HOSPITAL_COMMUNITY): Payer: Self-pay | Admitting: Internal Medicine

## 2021-03-02 ENCOUNTER — Other Ambulatory Visit (HOSPITAL_COMMUNITY): Payer: Medicare HMO

## 2021-03-02 ENCOUNTER — Inpatient Hospital Stay (HOSPITAL_COMMUNITY): Payer: Medicare HMO

## 2021-03-02 DIAGNOSIS — J9601 Acute respiratory failure with hypoxia: Secondary | ICD-10-CM | POA: Diagnosis not present

## 2021-03-02 DIAGNOSIS — I2699 Other pulmonary embolism without acute cor pulmonale: Secondary | ICD-10-CM

## 2021-03-02 DIAGNOSIS — Q211 Atrial septal defect: Secondary | ICD-10-CM

## 2021-03-02 LAB — POCT I-STAT 7, (LYTES, BLD GAS, ICA,H+H)
Acid-Base Excess: 1 mmol/L (ref 0.0–2.0)
Bicarbonate: 24 mmol/L (ref 20.0–28.0)
Calcium, Ion: 1.19 mmol/L (ref 1.15–1.40)
HCT: 31 % — ABNORMAL LOW (ref 36.0–46.0)
Hemoglobin: 10.5 g/dL — ABNORMAL LOW (ref 12.0–15.0)
O2 Saturation: 100 %
Patient temperature: 97.8
Potassium: 4.5 mmol/L (ref 3.5–5.1)
Sodium: 136 mmol/L (ref 135–145)
TCO2: 25 mmol/L (ref 22–32)
pCO2 arterial: 30 mmHg — ABNORMAL LOW (ref 32.0–48.0)
pH, Arterial: 7.509 — ABNORMAL HIGH (ref 7.350–7.450)
pO2, Arterial: 344 mmHg — ABNORMAL HIGH (ref 83.0–108.0)

## 2021-03-02 LAB — CBC WITH DIFFERENTIAL/PLATELET
Abs Immature Granulocytes: 0.06 10*3/uL (ref 0.00–0.07)
Basophils Absolute: 0 10*3/uL (ref 0.0–0.1)
Basophils Relative: 0 %
Eosinophils Absolute: 0 10*3/uL (ref 0.0–0.5)
Eosinophils Relative: 0 %
HCT: 36.4 % (ref 36.0–46.0)
Hemoglobin: 12 g/dL (ref 12.0–15.0)
Immature Granulocytes: 1 %
Lymphocytes Relative: 7 %
Lymphs Abs: 0.8 10*3/uL (ref 0.7–4.0)
MCH: 29.8 pg (ref 26.0–34.0)
MCHC: 33 g/dL (ref 30.0–36.0)
MCV: 90.3 fL (ref 80.0–100.0)
Monocytes Absolute: 0.2 10*3/uL (ref 0.1–1.0)
Monocytes Relative: 2 %
Neutro Abs: 9.2 10*3/uL — ABNORMAL HIGH (ref 1.7–7.7)
Neutrophils Relative %: 90 %
Platelets: 203 10*3/uL (ref 150–400)
RBC: 4.03 MIL/uL (ref 3.87–5.11)
RDW: 13.6 % (ref 11.5–15.5)
WBC: 10.2 10*3/uL (ref 4.0–10.5)
nRBC: 0 % (ref 0.0–0.2)

## 2021-03-02 LAB — COMPREHENSIVE METABOLIC PANEL
ALT: 15 U/L (ref 0–44)
AST: 28 U/L (ref 15–41)
Albumin: 3.1 g/dL — ABNORMAL LOW (ref 3.5–5.0)
Alkaline Phosphatase: 57 U/L (ref 38–126)
Anion gap: 12 (ref 5–15)
BUN: 18 mg/dL (ref 8–23)
CO2: 21 mmol/L — ABNORMAL LOW (ref 22–32)
Calcium: 8.9 mg/dL (ref 8.9–10.3)
Chloride: 102 mmol/L (ref 98–111)
Creatinine, Ser: 1.25 mg/dL — ABNORMAL HIGH (ref 0.44–1.00)
GFR, Estimated: 44 mL/min — ABNORMAL LOW (ref >60)
Glucose, Bld: 143 mg/dL — ABNORMAL HIGH (ref 70–99)
Potassium: 4.4 mmol/L (ref 3.5–5.1)
Sodium: 135 mmol/L (ref 135–145)
Total Bilirubin: 0.8 mg/dL (ref 0.3–1.2)
Total Protein: 5.6 g/dL — ABNORMAL LOW (ref 6.5–8.1)

## 2021-03-02 LAB — BLOOD CULTURE ID PANEL (REFLEXED) - BCID2

## 2021-03-02 LAB — ECHOCARDIOGRAM COMPLETE BUBBLE STUDY
AR max vel: 2.37 cm2
AV Area VTI: 2.1 cm2
AV Area mean vel: 2.06 cm2
AV Mean grad: 2 mmHg
AV Peak grad: 3.9 mmHg
Ao pk vel: 0.98 m/s
Area-P 1/2: 5.13 cm2
MV VTI: 2.03 cm2
S' Lateral: 2.6 cm

## 2021-03-02 LAB — MRSA PCR SCREENING: MRSA by PCR: NEGATIVE

## 2021-03-02 LAB — CBC
HCT: 33.8 % — ABNORMAL LOW (ref 36.0–46.0)
Hemoglobin: 11.6 g/dL — ABNORMAL LOW (ref 12.0–15.0)
MCH: 30.7 pg (ref 26.0–34.0)
MCHC: 34.3 g/dL (ref 30.0–36.0)
MCV: 89.4 fL (ref 80.0–100.0)
Platelets: 186 10*3/uL (ref 150–400)
RBC: 3.78 MIL/uL — ABNORMAL LOW (ref 3.87–5.11)
RDW: 13.4 % (ref 11.5–15.5)
WBC: 10.1 10*3/uL (ref 4.0–10.5)
nRBC: 0 % (ref 0.0–0.2)

## 2021-03-02 LAB — PROTIME-INR
INR: 2.1 — ABNORMAL HIGH (ref 0.8–1.2)
Prothrombin Time: 23.2 seconds — ABNORMAL HIGH (ref 11.4–15.2)

## 2021-03-02 LAB — TROPONIN I (HIGH SENSITIVITY)
Troponin I (High Sensitivity): 2351 ng/L (ref <18)
Troponin I (High Sensitivity): 7090 ng/L (ref <18)

## 2021-03-02 LAB — APTT
aPTT: 120 seconds — ABNORMAL HIGH (ref 24–36)
aPTT: 50 seconds — ABNORMAL HIGH (ref 24–36)
aPTT: 199 seconds (ref 24–36)

## 2021-03-02 LAB — GLUCOSE, CAPILLARY
Glucose-Capillary: 153 mg/dL — ABNORMAL HIGH (ref 70–99)
Glucose-Capillary: 128 mg/dL — ABNORMAL HIGH (ref 70–99)

## 2021-03-02 LAB — MAGNESIUM: Magnesium: 1.8 mg/dL (ref 1.7–2.4)

## 2021-03-02 MED ORDER — HEPARIN (PORCINE) 25000 UT/250ML-% IV SOLN
800.0000 [IU]/h | INTRAVENOUS | Status: DC
  Administered 2021-03-02: 800 [IU]/h via INTRAVENOUS
  Filled 2021-03-02: qty 250

## 2021-03-02 MED ORDER — BUPROPION HCL ER (XL) 150 MG PO TB24
150.0000 mg | ORAL_TABLET | Freq: Every day | ORAL | Status: DC
  Administered 2021-03-02 – 2021-03-07 (×6): 150 mg via ORAL
  Filled 2021-03-02 (×6): qty 1

## 2021-03-02 MED ORDER — ATORVASTATIN CALCIUM 10 MG PO TABS
20.0000 mg | ORAL_TABLET | Freq: Every day | ORAL | Status: DC
  Administered 2021-03-02 – 2021-03-07 (×6): 20 mg via ORAL
  Filled 2021-03-02 (×6): qty 2

## 2021-03-02 MED ORDER — CHLORHEXIDINE GLUCONATE CLOTH 2 % EX PADS
6.0000 | MEDICATED_PAD | Freq: Every day | CUTANEOUS | Status: DC
  Administered 2021-03-02 – 2021-03-06 (×5): 6 via TOPICAL

## 2021-03-02 MED ORDER — HEPARIN (PORCINE) 25000 UT/250ML-% IV SOLN
700.0000 [IU]/h | INTRAVENOUS | Status: AC
  Administered 2021-03-03: 23:00:00 600 [IU]/h via INTRAVENOUS
  Filled 2021-03-02 (×2): qty 250

## 2021-03-02 MED ORDER — ALPRAZOLAM ER 0.5 MG PO TB24: 0.5000 mg | ORAL_TABLET | Freq: Every day | ORAL | Status: DC

## 2021-03-02 MED ORDER — PROPRANOLOL HCL 20 MG PO TABS
20.0000 mg | ORAL_TABLET | Freq: Two times a day (BID) | ORAL | Status: DC
  Administered 2021-03-02: 20 mg via ORAL
  Filled 2021-03-02 (×3): qty 1

## 2021-03-02 NOTE — Progress Notes (Signed)
Patient arrived to 2M04 from ED on 100% Bipap. Patient is alert and oriented X3 and sometimes X4. Patient has a history of previous stroke and she has some residual effects from this such as right sided weakness, right pupil is slightly larger than left, and expressive aphasia. Two PIVS are C/D/I in bilateral AC's. Skin assessment done with Moinique High, RN and patients skin is intact. Sacral foam placed. Patient had a night gown and robe on that were cut by EMS and patient gave permission to throw in the trash. Light green underwear removed from patient and placed in patient belonging bag. No other items noted. Patient was given the call bell and instructed on how to use it. Bed in lowest position and bed alarm on. Will continue to monitor.

## 2021-03-02 NOTE — Progress Notes (Signed)
Elink informed of Troponin of 7,090. Will pass this on to day shift per [facility] Iv RN.

## 2021-03-02 NOTE — Progress Notes (Signed)
ANTICOAGULATION CONSULT NOTE - Follow Up Consult  Pharmacy Consult for heparin Indication: pulmonary embolus  Labs: Recent Labs    03/01/21 1753 03/01/21 1802 03/01/21 1927 03/01/21 2147 03/02/21 0130 03/02/21 0440 03/02/21 0443  HGB 12.7 13.3  --  11.6* 12.0  --  10.5*  HCT 40.2 39.0  --  33.8* 36.4  --  31.0*  PLT 244  --   --  186 203  --   --   APTT  --   --   --   --  120* 50*  --   LABPROT 12.7  --   --   --  23.2*  --   --   INR 1.0  --   --   --  2.1*  --   --   CREATININE 1.33* 1.30*  --   --   --   --   --   TROPONINIHS 491*  --  1,728* 2,351*  --   --   --     Assessment: 80yo female now with subtherapeutic PTT after alteplase, now to start heparin.  Goal of Therapy:  Heparin level 0.3-0.5 units/ml until 3/7 0100 then 0.3-0.7 units/ml   Plan:  Will start heparin gtt at 800 units/hr and check level in 8 hours.    Wynona Neat, PharmD, BCPS  03/02/2021,5:25 AM

## 2021-03-02 NOTE — Progress Notes (Signed)
ANTICOAGULATION CONSULT NOTE  Pharmacy Consult for heparin Indication: pulmonary embolus  No Known Allergies  Patient Measurements: Height: 5\' 4"  (162.6 cm) Weight: 64 kg (141 lb 1.5 oz) IBW/kg (Calculated) : 54.7 Heparin Dosing Weight: 63.5 kg   Vital Signs: Temp: 98.2 F (36.8 C) (03/06 0700) Temp Source: Axillary (03/06 0700) BP: 128/59 (03/06 1417) Pulse Rate: 61 (03/06 1127)  Labs: Recent Labs    03/01/21 1753 03/01/21 1802 03/01/21 1927 03/01/21 2147 03/02/21 0130 03/02/21 0440 03/02/21 0443 03/02/21 1423  HGB 12.7 13.3  --  11.6* 12.0  --  10.5*  --   HCT 40.2 39.0  --  33.8* 36.4  --  31.0*  --   PLT 244  --   --  186 203  --   --   --   APTT  --   --   --   --  120* 50*  --   --   LABPROT 12.7  --   --   --  23.2*  --   --   --   INR 1.0  --   --   --  2.1*  --   --   --   HEPARINUNFRC  --   --   --   --   --   --   --  1.40*  CREATININE 1.33* 1.30*  --   --  1.25*  --   --   --   TROPONINIHS 491*  --  1,728* 2,351*  --  7,090*  --   --     Estimated Creatinine Clearance: 31.5 mL/min (A) (by C-G formula based on SCr of 1.25 mg/dL (H)).   Medical History: Past Medical History:  Diagnosis Date  . 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 Chronic kidney disease   . Depression   . Hyperlipidemia   . Hypertension   . Stroke [facility])     Medications:  Scheduled:  . ALPRAZolam  0.5 mg Oral Daily  . atorvastatin  20 mg Oral Daily  . buPROPion  150 mg Oral Daily  . Chlorhexidine Gluconate Cloth  6 each Topical Daily  . mouth rinse  15 mL Mouth Rinse BID  . propranolol  20 mg Oral BID    Assessment: 17 yof withhx CVA in 2016 presenting with severe SOB and syncope - now on BiPAP. CTA finding extensive bilateral PE with RV/LV ratio 1.67. Did receive dose of enoxaparin at 2111.   Received tPA given RV/LV ratio, low O2 saturations requiring BiPAP, and associated syncope after discussion with MD on 3/5 PM. Restarted on heparin infusion at 800 units/hr  appropriately. Hgb 12, plt 203, trop 7090 today. No infusion issues. Initial heparin level came back at 1.4 (drawn at location of heparin infusion) - redraw came back supratherapeutic at 1.43 (confirmed drawn from opposite side). Add-on aPTT came back at 199 - correlates with HL. Slightly oozing at IV site per RN - will monitor closely.    Goal of Therapy:  Heparin level 0.3-0.5 units/ml for 24 hours after tPA then 0.3-0.7 Monitor platelets by anticoagulation protocol: Yes   Plan:  Hold heparin infusion for 1.5 hour Will reduce heparin infusion to 650 units/hr upon restart Get level in 8 hours  Monitor s/sx of bleeding, CBC  Antonietta Jewel, PharmD, [place] Pharmacist  Phone: [phone] 03/02/2021 4:04 PM  Please check AMION for all [place] phone numbers After 10:00 PM, call [place] [phone]

## 2021-03-02 NOTE — Progress Notes (Signed)
PHARMACY - PHYSICIAN COMMUNICATION CRITICAL VALUE ALERT - BLOOD CULTURE IDENTIFICATION (BCID)  Nicole Ruiz is an 80 y.o. female who presented to [facility] on 03/01/2021 with a chief complaint of syncope and severe hypoxia  Assessment:  1/3 blood cultures positive for GPCs in cluster - identified as staph species likely a contminant  Name of physician (or Provider) Contacted: Dr. Elsworth Soho  Current antibiotics: none  Changes to prescribed antibiotics recommended:  No changes needed  Results for orders placed or performed during the hospital encounter of 03/01/21  Blood Culture ID Panel (Reflexed) (Collected: 03/01/2021  6:00 PM)  Result Value Ref Range   Enterococcus faecalis NOT DETECTED NOT DETECTED   Enterococcus Faecium NOT DETECTED NOT DETECTED   Listeria monocytogenes NOT DETECTED NOT DETECTED   Staphylococcus species DETECTED (A) NOT DETECTED   Staphylococcus aureus (BCID) NOT DETECTED NOT DETECTED   Staphylococcus epidermidis NOT DETECTED NOT DETECTED   Staphylococcus lugdunensis NOT DETECTED NOT DETECTED   Streptococcus species NOT DETECTED NOT DETECTED   Streptococcus agalactiae NOT DETECTED NOT DETECTED   Streptococcus pneumoniae NOT DETECTED NOT DETECTED   Streptococcus pyogenes NOT DETECTED NOT DETECTED   A.calcoaceticus-baumannii NOT DETECTED NOT DETECTED   Bacteroides fragilis NOT DETECTED NOT DETECTED   Enterobacterales NOT DETECTED NOT DETECTED   Enterobacter cloacae complex NOT DETECTED NOT DETECTED   Escherichia coli NOT DETECTED NOT DETECTED   Klebsiella aerogenes NOT DETECTED NOT DETECTED   Klebsiella oxytoca NOT DETECTED NOT DETECTED   Klebsiella pneumoniae NOT DETECTED NOT DETECTED   Proteus species NOT DETECTED NOT DETECTED   Salmonella species NOT DETECTED NOT DETECTED   Serratia marcescens NOT DETECTED NOT DETECTED   Haemophilus influenzae NOT DETECTED NOT DETECTED   Neisseria meningitidis NOT DETECTED NOT DETECTED   Pseudomonas aeruginosa NOT DETECTED  NOT DETECTED   Stenotrophomonas maltophilia NOT DETECTED NOT DETECTED   Candida albicans NOT DETECTED NOT DETECTED   Candida auris NOT DETECTED NOT DETECTED   Candida glabrata NOT DETECTED NOT DETECTED   Candida krusei NOT DETECTED NOT DETECTED   Candida parapsilosis NOT DETECTED NOT DETECTED   Candida tropicalis NOT DETECTED NOT DETECTED   Cryptococcus neoformans/gattii NOT DETECTED NOT DETECTED    Corinda Gubler 03/02/2021  2:08 PM

## 2021-03-02 NOTE — Progress Notes (Incomplete)
  Echocardiogram 2D Echocardiogram has been performed.  Nicole Ruiz 03/02/2021, 2:24 PM

## 2021-03-02 NOTE — Progress Notes (Signed)
NAME:  Nicole Ruiz, MRN:  [id number], DOB:  1941-02-20, LOS: 1 ADMISSION DATE:  03/01/2021, CONSULTATION DATE: March 01, 2021 REFERRING MD: ED physician, CHIEF COMPLAINT: Passing out shortness of breath  Brief History:  80 year old woman with history of cryptogenic stroke 2016, known PFO admitted with syncope and severe hypoxia and found to have bilateral submassive PE   PMH -tremors Cryptogenic stroke 2016, TEE PFO+ bubble study positive,  Significant tests CT angio chest 3/5 Extensive bilateral pulmonary emboli involving the distal left and right main pulmonary arteries into the lobar and segmental branches throughout both lungs. Positive for acute PE with CT evidence of right heart strain (RV/LV Ratio 1.67  Scattered centrilobular ground-glass nodularity throughout both lungs, with a slight mid to lower lung predominance  TTE/bubble study 3/6 Venous duplex BLE 3/6   Significant  events : 3/5 TPA at 11 PM   SUBJ -  TPA administered last night, placed on BiPAP 100%. This a.m. transition to nasal cannula Hemodynamically stable, afebrile Complains of chest heaviness, no dyspnea    Objective   Blood pressure (!) 160/77, pulse 63, temperature 98.2 F (36.8 C), temperature source Axillary, resp. rate 15, height 5\' 4"  (1.626 m), weight 64 kg, SpO2 100 %.    Vent Mode: BIPAP;PCV FiO2 (%):  [50 %-100 %] 50 % Set Rate:  [10 bmp] 10 bmp PEEP:  [8 cmH20] 8 cmH20   Intake/Output Summary (Last 24 hours) at 03/02/2021 0843 Last data filed at 03/02/2021 0800 Gross per 24 hour  Intake 874.54 ml  Output 940 ml  Net -65.46 ml   Filed Weights   03/01/21 1807 03/01/21 2200 03/02/21 0500  Weight: 63.5 kg 64 kg 64 kg    Examination: General:  NAD , on BiPAP 50%, saturation 90% Neuro:  WNL , AOX3 , EOMI , CN II-XII intact , UL , LL strength is symmetrical and 5/5 HEENT:  atraumatic , no jaundice , dry mucous membranes  Cardiovascular: S1-S2 regular, no murmur Lungs: Bilateral  clear breath sounds, no accessory muscle use Abdomen:  Soft lax +BS , no tenderness . Musculoskeletal:  WNL , normal pulses  Skin:  No rash   CT chest independently reviewed. ABG shows mild respiratory alkalosis, significant improvement and hypoxia. Rising troponin slight drop in hemoglobin      Assessment & Plan:  Acute hypoxemic respiratory failure Submassive PE status post TPA  -Dramatic improvement in hypoxia, transition to nasal cannula , continue IV heparin -Elevated troponin likely due to RV strain, will obtain bubble study -Obtain venous duplex for completion    History of PFO -we will obtain transthoracic echo with bubble study -She may need a TEE in the future, expect PFO and right-to-left shunting to be worse in the acute phase of PE, regardless will not qualify for intervention for PFO at this time and will need to reassess 6 to 12 weeks later, discussed with cardiology   History of anxiety/ tremors -Resume home dose of Xanax, propranolol and Wellbutrin   Labs   CBC: Recent Labs  Lab 03/01/21 1753 03/01/21 1802 03/01/21 2147 03/02/21 0130 03/02/21 0443  WBC 10.8*  --  10.1 10.2  --   NEUTROABS  --   --   --  9.2*  --   HGB 12.7 13.3 11.6* 12.0 10.5*  HCT 40.2 39.0 33.8* 36.4 31.0*  MCV 94.6  --  89.4 90.3  --   PLT 244  --  186 203  --     Basic Metabolic  Panel: Recent Labs  Lab 03/01/21 1749 03/01/21 1753 03/01/21 1802 03/02/21 0130 03/02/21 0443  NA 137 137 138 135 136  K 3.9 3.7 3.7 4.4 4.5  CL  --  105 103 102  --   CO2  --  20*  --  21*  --   GLUCOSE  --  167* 163* 143*  --   BUN  --  17 19 18   --   CREATININE  --  1.33* 1.30* 1.25*  --   CALCIUM  --  9.2  --  8.9  --   MG  --   --   --  1.8  --    GFR: Estimated Creatinine Clearance: 31.5 mL/min (A) (by C-G formula based on SCr of 1.25 mg/dL (H)). Recent Labs  Lab 03/01/21 1753 03/01/21 1758 03/01/21 1927 03/01/21 2147 03/02/21 0130  WBC 10.8*  --   --  10.1 10.2   LATICACIDVEN  --  5.2* 3.3*  --   --     Liver Function Tests: Recent Labs  Lab 03/01/21 1753 03/02/21 0130  AST 17 28  ALT 16 15  ALKPHOS 67 57  BILITOT 0.7 0.8  PROT 6.7 5.6*  ALBUMIN 3.6 3.1*   No results for input(s): LIPASE, AMYLASE in the last 168 hours. No results for input(s): AMMONIA in the last 168 hours.  ABG    Component Value Date/Time   PHART 7.509 (H) 03/02/2021 0443   PCO2ART 30.0 (L) 03/02/2021 0443   PO2ART 344 (H) 03/02/2021 0443   HCO3 24.0 03/02/2021 0443   TCO2 25 03/02/2021 0443   ACIDBASEDEF 5.1 (H) 03/01/2021 1830   O2SAT 100.0 03/02/2021 0443     Coagulation Profile: Recent Labs  Lab 03/01/21 1753 03/02/21 0130  INR 1.0 2.1*    Cardiac Enzymes: No results for input(s): CKTOTAL, CKMB, CKMBINDEX, TROPONINI in the last 168 hours.  HbA1C: Hgb A1c MFr Bld  Date/Time Value Ref Range Status  03/10/2018 10:05 AM 5.6 4.8 - 5.6 % Final    Comment:             Prediabetes: 5.7 - 6.4          Diabetes: >6.4          Glycemic control for adults with diabetes: <7.0   10/20/2015 02:25 AM 5.8 (H) 4.8 - 5.6 % Final    Comment:    (NOTE)         Pre-diabetes: 5.7 - 6.4         Diabetes: >6.4         Glycemic control for adults with diabetes: <7.0     CBG: Recent Labs  Lab 03/01/21 2137 03/02/21 0410 03/02/21 0715  GLUCAP 128* 153* 128*     Patient critically ill due to massive PE, hypoxia Interventions to address this today thrombolytics, anticoagulation, BiPAP Risk of deterioration without these interventions is high  I personally spent 31 minutes providing critical care not including any separately billable procedures   Kara Mead MD. FCCP. [place] [facility] Pager : [phone]  If no response to pager , please call [phone] until 7 pm After 7:00 pm call Elink  [phone]   03/02/2021

## 2021-03-03 LAB — GLUCOSE, CAPILLARY: Glucose-Capillary: 82 mg/dL (ref 70–99)

## 2021-03-03 LAB — CBC
HCT: 31.2 % — ABNORMAL LOW (ref 36.0–46.0)
Hemoglobin: 10.5 g/dL — ABNORMAL LOW (ref 12.0–15.0)
MCH: 30.8 pg (ref 26.0–34.0)
MCHC: 33.7 g/dL (ref 30.0–36.0)
MCV: 91.5 fL (ref 80.0–100.0)
Platelets: 182 10*3/uL (ref 150–400)
RBC: 3.41 MIL/uL — ABNORMAL LOW (ref 3.87–5.11)
RDW: 13.6 % (ref 11.5–15.5)
WBC: 8.7 10*3/uL (ref 4.0–10.5)
nRBC: 0 % (ref 0.0–0.2)

## 2021-03-03 LAB — BASIC METABOLIC PANEL
Anion gap: 11 (ref 5–15)
BUN: 24 mg/dL — ABNORMAL HIGH (ref 8–23)
CO2: 20 mmol/L — ABNORMAL LOW (ref 22–32)
Calcium: 8.9 mg/dL (ref 8.9–10.3)
Chloride: 106 mmol/L (ref 98–111)
Creatinine, Ser: 1.48 mg/dL — ABNORMAL HIGH (ref 0.44–1.00)
GFR, Estimated: 36 mL/min — ABNORMAL LOW (ref >60)
Glucose, Bld: 92 mg/dL (ref 70–99)
Potassium: 4.6 mmol/L (ref 3.5–5.1)
Sodium: 137 mmol/L (ref 135–145)

## 2021-03-03 LAB — HEPARIN LEVEL (UNFRACTIONATED)
Heparin Unfractionated: 0.5 IU/mL (ref 0.30–0.70)
Heparin Unfractionated: 0.3 IU/mL (ref 0.30–0.70)
Heparin Unfractionated: 0.32 IU/mL (ref 0.30–0.70)

## 2021-03-03 LAB — TROPONIN I (HIGH SENSITIVITY): Troponin I (High Sensitivity): 1698 ng/L (ref <18)

## 2021-03-03 MED ORDER — ALPRAZOLAM 0.25 MG PO TABS: 0.2500 mg | ORAL_TABLET | Freq: Two times a day (BID) | ORAL | Status: DC | PRN

## 2021-03-03 MED ORDER — PROPRANOLOL HCL 10 MG PO TABS
10.0000 mg | ORAL_TABLET | Freq: Two times a day (BID) | ORAL | Status: DC
  Administered 2021-03-04 – 2021-03-06 (×3): 10 mg via ORAL
  Filled 2021-03-03 (×9): qty 1

## 2021-03-03 NOTE — Progress Notes (Signed)
ANTICOAGULATION CONSULT NOTE - Follow Up Consult  Pharmacy Consult for heparin Indication: pulmonary embolus  Labs: Recent Labs    03/01/21 1753 03/01/21 1802 03/01/21 1927 03/01/21 2147 03/02/21 0130 03/02/21 0440 03/02/21 0443 03/02/21 1423 03/02/21 1607 03/03/21 0139 03/03/21 1055 03/03/21 1104 03/03/21 1852  HGB 12.7 13.3  --  11.6* 12.0  --  10.5*  --   --  10.5*  --   --   --   HCT 40.2 39.0  --  33.8* 36.4  --  31.0*  --   --  31.2*  --   --   --   PLT 244  --   --  186 203  --   --   --   --  182  --   --   --   APTT  --   --   --   --  120* 50*  --   --  199*  --   --   --   --   LABPROT 12.7  --   --   --  23.2*  --   --   --   --   --   --   --   --   INR 1.0  --   --   --  2.1*  --   --   --   --   --   --   --   --   HEPARINUNFRC  --   --   --   --   --   --   --    < > 1.43* 0.50  --  0.30 0.32  CREATININE 1.33* 1.30*  --   --  1.25*  --   --   --   --  1.48*  --   --   --   TROPONINIHS 491*  --    < > 2,351*  --  7,090*  --   --   --   --  1,698*  --   --    < > = values in this interval not displayed.    Assessment:  39 yof withhx CVA in 2016 presenting with severe SOB and syncope - now on BiPAP. CTA finding extensive bilateral PE with RV/LV ratio 1.67. Did receive dose of enoxaparin at 2111.   Received tPA given RV/LV ratio, low O2 saturations requiring BiPAP, and associated syncope after discussion with MD on 3/5 PM. Restarted on heparin infusion based on aPTT appropriately.  Patient is therapeutic at 0.32, on heparin 600 units/hr. No further oozing, no infusion issues per nursing. CBC stable earlier.   Goal of Therapy:  Heparin level 0.3-0.7 units/ml   Plan:  Continue heparin infusion at 600 units/hr Order heparin level with AM labs Monitor s/sx of bleeding, CBC  Likely to change anticoagulation to oral option tomorrow, consulted case management for cost of apixaban vs. rivaroxaban  Antonietta Jewel, PharmD, [place] Pharmacist  Phone:  [phone] 03/03/2021 8:45 PM  Please check AMION for all [place] phone numbers After 10:00 PM, call [place] [phone]

## 2021-03-03 NOTE — Progress Notes (Addendum)
ANTICOAGULATION CONSULT NOTE - Follow Up Consult  Pharmacy Consult for heparin Indication: pulmonary embolus  Labs: Recent Labs    03/01/21 1753 03/01/21 1802 03/01/21 1927 03/01/21 2147 03/02/21 0130 03/02/21 0440 03/02/21 0443 03/02/21 1423 03/02/21 1607 03/03/21 0139  HGB 12.7 13.3  --  11.6* 12.0  --  10.5*  --   --  10.5*  HCT 40.2 39.0  --  33.8* 36.4  --  31.0*  --   --  31.2*  PLT 244  --   --  186 203  --   --   --   --  182  APTT  --   --   --   --  120* 50*  --   --  199*  --   LABPROT 12.7  --   --   --  23.2*  --   --   --   --   --   INR 1.0  --   --   --  2.1*  --   --   --   --   --   HEPARINUNFRC  --   --   --   --   --   --   --  1.40* 1.43* 0.50  CREATININE 1.33* 1.30*  --   --  1.25*  --   --   --   --  1.48*  TROPONINIHS 491*  --  1,728* 2,351*  --  7,090*  --   --   --   --     Assessment: 80yo female now therapeutic on heparin though RN reports that pt had begun to ooze from both PIVs and lost one of them, required pressure to be held, now resolved; heparin infusion had been stopped ~47min prior to lab draw, now off x1.5hr.  Hgb low but stable from last check.  Goal of Therapy:  Heparin level 0.3-0.7 units/ml   Plan:  Will resume heparin gtt at 600 units/hr and check level in 8 hours; RN to report if oozing recurs after infusion resumed.    Wynona Neat, PharmD, BCPS  03/03/2021,2:26 AM

## 2021-03-03 NOTE — Progress Notes (Signed)
eLink Physician-Brief Progress Note Patient Name: Nicole Ruiz DOB: [birth year] MRN: [id number]   Date of Service  03/03/2021  HPI/Events of Note  Patient is here with PE. They are on heparin drip. Had a supratherapeutic level at ~ 4pm with associated oozing at PIV sites, so drip was held for 1.5 hours then resumed at a lower rate.  RN notifies me that the patient is now oozing at PIV sites again. Heparin drip has been paused.   eICU Interventions  Draw H/H and heparin level now. Further management of anticoagulation pending that data.     Intervention Category Intermediate Interventions: Other:  Charlott Rakes 03/03/2021, 1:30 AM

## 2021-03-03 NOTE — Progress Notes (Signed)
NAME:  Nicole Ruiz, MRN:  [id number], DOB:  29-Mar-1941, LOS: 2 ADMISSION DATE:  03/01/2021, CONSULTATION DATE: March 01, 2021 REFERRING MD: ED physician, CHIEF COMPLAINT: Passing out shortness of breath  Brief History:  80 year old woman with history of cryptogenic stroke 2016, known PFO admitted with syncope and severe hypoxia and found to have bilateral submassive PE   PMH -tremors Cryptogenic stroke 2016, TEE PFO+ bubble study positive  Significant tests CT angio chest 3/5 Extensive bilateral pulmonary emboli involving the distal left and right main pulmonary arteries into the lobar and segmental branches throughout both lungs. Positive for acute PE with CT evidence of right heart strain (RV/LV Ratio 1.67  Scattered centrilobular ground-glass nodularity throughout both lungs, with a slight mid to lower lung predominance  TTE/bubble study 3/6 Right ventricle mildly enlarged. Rapidly, vigorously positive bubble study suggests significant right to left shunt  Venous duplex BLE 3/6 LLE with nonocclusive DVT involving SF junction, left common femoral vein, and proximal femoral vein. No evidence of DVT in RLE.   Significant  events : 3/5 TPA at 11 PM  SUBJECTIVE/ INTERVAL EVENTS Supratherapeutic heparin level with oozing around PIV yesterday. Since corrected to normal range without bleeding noted this morning Pt denies chest pain, pressure, shortness of breath, and dizziness. Continues to oxygenate well on 3L [place]. Hemodynamically stable without pressor or inotropic support. Afebrile.   Objective   Blood pressure (!) 100/57, pulse (!) 55, temperature 99 F (37.2 C), temperature source Oral, resp. rate 19, height 5\' 4"  (1.626 m), weight 64.2 kg, SpO2 96 %.        Intake/Output Summary (Last 24 hours) at 03/03/2021 9357 Last data filed at 03/03/2021 0500 Gross per 24 hour  Intake 1738.97 ml  Output 1150 ml  Net 588.97 ml   Filed Weights   03/01/21 2200 03/02/21 0500 03/03/21  0330  Weight: 64 kg 64 kg 64.2 kg    Examination: General:  NAD, sleepy, resting comfortably in bed Neuro:  No focal deficits, EOMI, strength and sensation WNL HEENT:  atraumatic , no jaundice , dry mucous membranes  Cardiovascular: RRR, normal S1S2, 2+ pulses bilateral radial and dorsalis pedis Lungs: Bilateral clear breath sounds, no accessory muscle use, sating 100% on 3 L [place] Abdomen:  Soft, nontender, nondistended Extremities: no peripheral edema, PIV without bleeding Skin:  Dark purple patch noted around dorsal right wrist and hand consistent with bruising       Assessment & Plan:  This is a 80 yo female who presented after acute onset shortness of breath and a syncopal episode found to have bilateral PE and right heart strain. Pt is s/p TPA on therapeutic heparin and improving significantly. Plan to transfer to step-down unit today.  Acute hypoxemic respiratory failure Submassive PE status post TPA -Continued stable oxygenation on 3L [place] -Continue IV heparin -Venous duplex with right sided DVT on 3/6  Right Heart Strain 2/2 Submassive PE -RV systolic dysfunction and mild dilation on TEE -Elevated troponin likely due to RV strain -f/u EKG prior to transfer to step-down unit  Acute on Chronic Kidney Injury, baseline sCr ~1.1-1.2 -sCr at 1.48 today from 1.25 3/6 -1.2 L UOP last 24 hrs -likely 2/2 to poor PO intake -repeat BMP in AM  History of PFO -Significant right to left shunt noted on bubble study 3/6 -Reassess with TEE in 6-12 weeks -f/u with Cardiology at that time for possible intervention  Supratherapeutic Heparin Level - oozing around PIV, Heparin level 1.43 ~ 4PM 3/6 -  f/u heparin level 0.5 with no bleeding this morning  History of anxiety/ tremors -Resume home dose of Xanax, propranolol and Wellbutrin   Labs   CBC: Recent Labs  Lab 03/01/21 1753 03/01/21 1802 03/01/21 2147 03/02/21 0130 03/02/21 0443 03/03/21 0139  WBC 10.8*  --  10.1 10.2  --   8.7  NEUTROABS  --   --   --  9.2*  --   --   HGB 12.7 13.3 11.6* 12.0 10.5* 10.5*  HCT 40.2 39.0 33.8* 36.4 31.0* 31.2*  MCV 94.6  --  89.4 90.3  --  91.5  PLT 244  --  186 203  --  834    Basic Metabolic Panel: Recent Labs  Lab 03/01/21 1753 03/01/21 1802 03/02/21 0130 03/02/21 0443 03/03/21 0139  NA 137 138 135 136 137  K 3.7 3.7 4.4 4.5 4.6  CL 105 103 102  --  106  CO2 20*  --  21*  --  20*  GLUCOSE 167* 163* 143*  --  92  BUN 17 19 18   --  24*  CREATININE 1.33* 1.30* 1.25*  --  1.48*  CALCIUM 9.2  --  8.9  --  8.9  MG  --   --  1.8  --   --    GFR: Estimated Creatinine Clearance: 26.6 mL/min (A) (by C-G formula based on SCr of 1.48 mg/dL (H)). Recent Labs  Lab 03/01/21 1753 03/01/21 1758 03/01/21 1927 03/01/21 2147 03/02/21 0130 03/03/21 0139  WBC 10.8*  --   --  10.1 10.2 8.7  LATICACIDVEN  --  5.2* 3.3*  --   --   --     Liver Function Tests: Recent Labs  Lab 03/01/21 1753 03/02/21 0130  AST 17 28  ALT 16 15  ALKPHOS 67 57  BILITOT 0.7 0.8  PROT 6.7 5.6*  ALBUMIN 3.6 3.1*   No results for input(s): LIPASE, AMYLASE in the last 168 hours. No results for input(s): AMMONIA in the last 168 hours.  ABG    Component Value Date/Time   PHART 7.509 (H) 03/02/2021 0443   PCO2ART 30.0 (L) 03/02/2021 0443   PO2ART 344 (H) 03/02/2021 0443   HCO3 24.0 03/02/2021 0443   TCO2 25 03/02/2021 0443   ACIDBASEDEF 5.1 (H) 03/01/2021 1830   O2SAT 100.0 03/02/2021 0443     Coagulation Profile: Recent Labs  Lab 03/01/21 1753 03/02/21 0130  INR 1.0 2.1*    Cardiac Enzymes: No results for input(s): CKTOTAL, CKMB, CKMBINDEX, TROPONINI in the last 168 hours.  HbA1C: Hgb A1c MFr Bld  Date/Time Value Ref Range Status  03/10/2018 10:05 AM 5.6 4.8 - 5.6 % Final    Comment:             Prediabetes: 5.7 - 6.4          Diabetes: >6.4          Glycemic control for adults with diabetes: <7.0   10/20/2015 02:25 AM 5.8 (H) 4.8 - 5.6 % Final    Comment:     (NOTE)         Pre-diabetes: 5.7 - 6.4         Diabetes: >6.4         Glycemic control for adults with diabetes: <7.0     CBG: Recent Labs  Lab 03/01/21 2137 03/02/21 0410 03/02/21 0715 03/03/21 0719  GLUCAP 128* 153* [place]   Calvert Cantor, Medical Student 03/03/2021, 8:21 AM

## 2021-03-03 NOTE — Progress Notes (Signed)
Upon assessment, patient has excessive bleeding at both PIV sites. Elink called and notified. I recommended an heparin level and H/H be drawn.

## 2021-03-03 NOTE — Progress Notes (Addendum)
ANTICOAGULATION CONSULT NOTE - Follow Up Consult  Pharmacy Consult for heparin Indication: pulmonary embolus  Labs: Recent Labs    03/01/21 1753 03/01/21 1802 03/01/21 1927 03/01/21 2147 03/02/21 0130 03/02/21 0440 03/02/21 0443 03/02/21 1423 03/02/21 1607 03/03/21 0139 03/03/21 1104  HGB 12.7 13.3  --  11.6* 12.0  --  10.5*  --   --  10.5*  --   HCT 40.2 39.0  --  33.8* 36.4  --  31.0*  --   --  31.2*  --   PLT 244  --   --  186 203  --   --   --   --  182  --   APTT  --   --   --   --  120* 50*  --   --  199*  --   --   LABPROT 12.7  --   --   --  23.2*  --   --   --   --   --   --   INR 1.0  --   --   --  2.1*  --   --   --   --   --   --   HEPARINUNFRC  --   --   --   --   --   --   --    < > 1.43* 0.50 0.30  CREATININE 1.33* 1.30*  --   --  1.25*  --   --   --   --  1.48*  --   TROPONINIHS 491*  --  1,728* 2,351*  --  7,090*  --   --   --   --   --    < > = values in this interval not displayed.    Assessment:  22 yof withhx CVA in 2016 presenting with severe SOB and syncope - now on BiPAP. CTA finding extensive bilateral PE with RV/LV ratio 1.67. Did receive dose of enoxaparin at 2111.   Received tPA given RV/LV ratio, low O2 saturations requiring BiPAP, and associated syncope after discussion with MD on 3/5 PM. Restarted on heparin infusion based on aPTT appropriately.  Patient now therapeutic at 0.3 on heparin 600 units/hr since resuming heparin after infusion stopped for bleeding at IV sites. No oozing at heparin infusion site today but some oozing in other PIV per RN. Hgb 10.5 Plts 102.  Goal of Therapy:  Heparin level 0.3-0.7 units/ml   Plan:  Will resume heparin gtt at 600 units/hr and check level in 8 hours to ensure pt remains therapeutic.  Monitor s/sx of bleeding, CBC  Likely to change anticoagulation to oral option tomorrow, consulted case management for cost of apixaban vs. rivaroxaban  Benna Dunks  PharmD Candidate, Class of 2022 03/03/2021,12:41  PM

## 2021-03-04 DIAGNOSIS — N179 Acute kidney failure, unspecified: Secondary | ICD-10-CM

## 2021-03-04 DIAGNOSIS — F0390 Unspecified dementia without behavioral disturbance: Secondary | ICD-10-CM

## 2021-03-04 DIAGNOSIS — E039 Hypothyroidism, unspecified: Secondary | ICD-10-CM

## 2021-03-04 DIAGNOSIS — R251 Tremor, unspecified: Secondary | ICD-10-CM

## 2021-03-04 DIAGNOSIS — F418 Other specified anxiety disorders: Secondary | ICD-10-CM

## 2021-03-04 LAB — CULTURE, BLOOD (ROUTINE X 2): Special Requests: ADEQUATE

## 2021-03-04 LAB — CBC
HCT: 27.1 % — ABNORMAL LOW (ref 36.0–46.0)
Hemoglobin: 9 g/dL — ABNORMAL LOW (ref 12.0–15.0)
MCH: 30.8 pg (ref 26.0–34.0)
MCHC: 33.2 g/dL (ref 30.0–36.0)
MCV: 92.8 fL (ref 80.0–100.0)
Platelets: 197 10*3/uL (ref 150–400)
RBC: 2.92 MIL/uL — ABNORMAL LOW (ref 3.87–5.11)
RDW: 13.5 % (ref 11.5–15.5)
WBC: 6.8 10*3/uL (ref 4.0–10.5)
nRBC: 0 % (ref 0.0–0.2)

## 2021-03-04 LAB — HEPARIN LEVEL (UNFRACTIONATED): Heparin Unfractionated: 0.22 IU/mL — ABNORMAL LOW (ref 0.30–0.70)

## 2021-03-04 LAB — TSH: TSH: 1.674 u[IU]/mL (ref 0.350–4.500)

## 2021-03-04 MED ORDER — MEMANTINE HCL 10 MG PO TABS
10.0000 mg | ORAL_TABLET | Freq: Two times a day (BID) | ORAL | Status: DC
  Administered 2021-03-04 – 2021-03-07 (×7): 10 mg via ORAL
  Filled 2021-03-04 (×7): qty 1

## 2021-03-04 MED ORDER — APIXABAN 5 MG PO TABS: 5.0000 mg | ORAL_TABLET | Freq: Two times a day (BID) | ORAL | Status: DC

## 2021-03-04 MED ORDER — PANTOPRAZOLE SODIUM 40 MG PO TBEC
40.0000 mg | DELAYED_RELEASE_TABLET | Freq: Every day | ORAL | Status: DC
  Administered 2021-03-04 – 2021-03-07 (×4): 40 mg via ORAL
  Filled 2021-03-04 (×4): qty 1

## 2021-03-04 MED ORDER — ONDANSETRON HCL 4 MG/2ML IJ SOLN
INTRAMUSCULAR | Status: AC
  Filled 2021-03-04: qty 2

## 2021-03-04 MED ORDER — DONEPEZIL HCL 10 MG PO TABS
10.0000 mg | ORAL_TABLET | Freq: Every day | ORAL | Status: DC
  Administered 2021-03-04 – 2021-03-07 (×4): 10 mg via ORAL
  Filled 2021-03-04 (×4): qty 1

## 2021-03-04 MED ORDER — PRIMIDONE 50 MG PO TABS
50.0000 mg | ORAL_TABLET | Freq: Two times a day (BID) | ORAL | Status: DC
  Administered 2021-03-04 – 2021-03-07 (×7): 50 mg via ORAL
  Filled 2021-03-04 (×7): qty 1

## 2021-03-04 MED ORDER — APIXABAN 5 MG PO TABS
10.0000 mg | ORAL_TABLET | Freq: Two times a day (BID) | ORAL | Status: DC
  Administered 2021-03-04 – 2021-03-07 (×7): 10 mg via ORAL
  Filled 2021-03-04 (×7): qty 2

## 2021-03-04 MED ORDER — ONDANSETRON HCL 4 MG/2ML IJ SOLN
4.0000 mg | Freq: Four times a day (QID) | INTRAMUSCULAR | Status: DC | PRN
  Administered 2021-03-04 – 2021-03-06 (×3): 4 mg via INTRAVENOUS
  Filled 2021-03-04 (×2): qty 2

## 2021-03-04 MED ORDER — FLUOXETINE HCL 20 MG PO CAPS
40.0000 mg | ORAL_CAPSULE | Freq: Every day | ORAL | Status: DC
  Administered 2021-03-04 – 2021-03-07 (×4): 40 mg via ORAL
  Filled 2021-03-04 (×4): qty 2

## 2021-03-04 MED ORDER — LEVOTHYROXINE SODIUM 50 MCG PO TABS
50.0000 ug | ORAL_TABLET | Freq: Every day | ORAL | Status: DC
  Administered 2021-03-05 – 2021-03-07 (×3): 50 ug via ORAL
  Filled 2021-03-04 (×3): qty 1

## 2021-03-04 MED ORDER — OMEPRAZOLE MAGNESIUM 20 MG PO TBEC: 20.0000 mg | DELAYED_RELEASE_TABLET | Freq: Every day | ORAL | Status: DC

## 2021-03-04 NOTE — TOC Benefit Eligibility Note (Signed)
Transition of Care [facility]) Benefit Eligibility Note    Patient Details  Name: Nicole Ruiz MRN: [id number] Date of Birth: Oct 29, 1941   Medication/Dose: ELIQUIS  5 MG BID  CO-PAY-$47.00   , ELIQUIS  2.5 MG BID  CO-PAY- $47.00    APIXABAN : NON-FORMULARY  , ELIQUIS  10 MG : NON-FORMULARY  Covered?: Yes  Tier: 3 Drug  Prescription Coverage [facility]erred Pharmacy: [facility]  and  [facility]  Spoke with Person/Company/Phone Number:: [employer]   @  [employer]  EA #  [phone]  Co-Pay: $ 47.00  Prior Approval: No  Deductible: Met  Additional Notes: XARELTO 15 MG BID  CO-PAY- $ 47.00  , XARELTO 20 MG DAILY CO-PAY-$47.00  TIER- 3 DRUG P/A-NO   APIXABAN : NON-FORMULARY    Memory Argue Phone Number: 03/04/2021, 10:12 AM

## 2021-03-04 NOTE — Progress Notes (Signed)
Pt's orthostatic b/p 1328 lying- 150/64 HR 64 (pt stated she felt light headed transitioning to sitting position. 1331 sitting-111/58 HR 62 1333 standing 103/90 HR 65

## 2021-03-04 NOTE — Discharge Instructions (Signed)
Information on my medicine - ELIQUIS (apixaban)  Why was Eliquis prescribed for you? Eliquis was prescribed to treat blood clots that may have been found in the veins of your legs (deep vein thrombosis) or in your lungs (pulmonary embolism) and to reduce the risk of them occurring again.  What do You need to know about Eliquis ? The starting dose is 10 mg (two 5 mg tablets) taken TWICE daily for the FIRST SEVEN (7) DAYS, then on 03/11/2021  the dose is reduced to ONE 5 mg tablet taken TWICE daily.  Eliquis may be taken with or without food.   Try to take the dose about the same time in the morning and in the evening. If you have difficulty swallowing the tablet whole please discuss with your pharmacist how to take the medication safely.  Take Eliquis exactly as prescribed and DO NOT stop taking Eliquis without talking to the doctor who prescribed the medication.  Stopping may increase your risk of developing a new blood clot.  Refill your prescription before you run out.  After discharge, you should have regular check-up appointments with your healthcare provider that is prescribing your Eliquis.    What do you do if you miss a dose? If a dose of ELIQUIS is not taken at the scheduled time, take it as soon as possible on the same day and twice-daily administration should be resumed. The dose should not be doubled to make up for a missed dose.  Important Safety Information A possible side effect of Eliquis is bleeding. You should call your healthcare provider right away if you experience any of the following: ? Bleeding from an injury or your nose that does not stop. ? Unusual colored urine (red or dark brown) or unusual colored stools (red or black). ? Unusual bruising for unknown reasons. ? A serious fall or if you hit your head (even if there is no bleeding).  Some medicines may interact with Eliquis and might increase your risk of bleeding or clotting while on Eliquis. To help  avoid this, consult your healthcare provider or pharmacist prior to using any new prescription or non-prescription medications, including herbals, vitamins, non-steroidal anti-inflammatory drugs (NSAIDs) and supplements.  This website has more information on Eliquis (apixaban): http://www.eliquis.com/eliquis/home

## 2021-03-04 NOTE — Progress Notes (Signed)
Vomited to about 50 ml brown emesis MD made aware. zofran given continue to monitor.

## 2021-03-04 NOTE — Progress Notes (Signed)
Pt's husband notified about pt's transfer to [facility]

## 2021-03-04 NOTE — Progress Notes (Signed)
PROGRESS NOTE    Nicole Ruiz  JWJ:[id number] DOB: [birth year] DOA: 03/01/2021 PCP: Bernerd Limbo, MD   Brief Narrative:  HPI On 03/01/2021 by Dr. Rollene Fare (PCCM) Patient has extensive past medical history of tremor anxiety vitamin B12 deficiency hypertension hyperlipidemia generalized weakness who is here with low O2 sat after passing out she is denying fever chills rigors nausea vomiting diarrhea patient is not aware that she has an opening in her heart but looking through her medical record and reviewing an echo from 2016 she had a PFO.  Interim history Admitted with acute onset of shortness of breath and a syncopal episode, found to have bilateral PE with right heart strain.  Status post TPA administration, was transitioned to heparin.  Will transition to Eliquis today.  Continues to need to supplemental oxygen.  Pending PT and OT evaluations. Assessment & Plan   Acute hypoxic respiratory failure secondary to submassive PE/ Age-indeterminate L DVT -Patient presented with shortness of breath -CTA chest showed extensive bilateral pulmonary emboli involving the distal left and right main pulmonary arteries into the lobar and segmental branches throughout both lungs.  Positive for acute PE with CT evidence of right heart strain (RV/LV ratio 1.67) -Patient was given TPA and then started on heparin drip -Lower extremity Doppler showed no evidence of right DVT.  Left showed an age-indeterminate nonocclusive DVT involving the SF junction, left common femoral and proximal femoral veins -Will transition patient to Eliquis today -Will ask for home desaturation test as she currently is requiring supplemental oxygen, although weaned from high flow  Acute kidney injury on chronic kidney disease stage IIIB -Creatinine peaked to 1.48 -Continue to monitor BMP  History of PFO -TTE/bubble study on 3/6 showed right ventricle mildly enlarged.  Rapidly, vigorously positive bubble study suggest significant  right to left shunt -Patient will need TEE in 6 to 12 weeks -Follow-up with cardiology  ?  Syncope -Suspect related to the above -Will also obtain orthostatic vitals -Pending PT and OT evaluations  Acute normocytic anemia -Hemoglobin currently down to 9, appears that baseline is approximately 11-12 -Given the patient is on anticoagulation, will continue to monitor CBC  Depression/Anxiety/tremors -Continue Xanax, propranolol, Wellbutrin -Will restart primidone and prozac  Hypothyroidism -Continue Synthroid  GERD -Continue PPI  Dementia -Continue donezepil, Namenda  DVT Prophylaxis Eliquis  Code Status: Full  Family Communication: None at bedside  Disposition Plan:  Status is: Inpatient  Remains inpatient appropriate because:Inpatient level of care appropriate due to severity of illness   Dispo: The patient is from: Home              Anticipated d/c is to: TBD              Patient currently is not medically stable to d/c.   Difficult to place patient No  Consultants PCCM  Procedures  Echocardiogram with bubble study Lower extremity Doppler  Antibiotics   Anti-infectives (From admission, onward)   None      Subjective:   Nicole Ruiz seen and examined today.  Continues to feel somewhat short of breath.  Confused about what is happening and what is going on with her.  States that she feels weak.  Complains of intermittent chest pain on the left side.  Denies current abdominal pain, nausea or vomiting, dizziness or headache.  Objective:   Vitals:   03/04/21 0700 03/04/21 0800 03/04/21 0808 03/04/21 0901  BP: 100/82 (!) 153/58  (!) 153/58  Pulse: 75 68  64  Resp:  18 (!) 21    Temp:   98.7 F (37.1 C)   TempSrc:   Oral   SpO2: 95% 97%    Weight:      Height:        Intake/Output Summary (Last 24 hours) at 03/04/2021 1053 Last data filed at 03/04/2021 1000 Gross per 24 hour  Intake 2073.32 ml  Output 400 ml  Net 1673.32 ml   Filed Weights    03/02/21 0500 03/03/21 0330 03/04/21 0156  Weight: 64 kg 64.2 kg 63.7 kg    Exam  General: Well developed, chronically ill-appearing, elderly, NAD  HEENT: NCAT, mucous membranes moist.   Cardiovascular: S1 S2 auscultated, RRR  Respiratory: Diminished breath sounds, some shortness of breath with speaking  Abdomen: Soft, nontender, nondistended, + bowel sounds  Extremities: warm dry without cyanosis clubbing or edema  Neuro: AAOx3, nonfocal  Psych: mildly anxious, however appropriate   Data Reviewed: I have personally reviewed following labs and imaging studies  CBC: Recent Labs  Lab 03/01/21 1753 03/01/21 1802 03/01/21 2147 03/02/21 0130 03/02/21 0443 03/03/21 0139 03/04/21 0109  WBC 10.8*  --  10.1 10.2  --  8.7 6.8  NEUTROABS  --   --   --  9.2*  --   --   --   HGB 12.7   < > 11.6* 12.0 10.5* 10.5* 9.0*  HCT 40.2   < > 33.8* 36.4 31.0* 31.2* 27.1*  MCV 94.6  --  89.4 90.3  --  91.5 92.8  PLT 244  --  186 203  --  182 197   < > = values in this interval not displayed.   Basic Metabolic Panel: Recent Labs  Lab 03/01/21 1753 03/01/21 1802 03/02/21 0130 03/02/21 0443 03/03/21 0139  NA 137 138 135 136 137  K 3.7 3.7 4.4 4.5 4.6  CL 105 103 102  --  106  CO2 20*  --  21*  --  20*  GLUCOSE 167* 163* 143*  --  92  BUN 17 19 18   --  24*  CREATININE 1.33* 1.30* 1.25*  --  1.48*  CALCIUM 9.2  --  8.9  --  8.9  MG  --   --  1.8  --   --    GFR: Estimated Creatinine Clearance: 26.6 mL/min (A) (by C-G formula based on SCr of 1.48 mg/dL (H)). Liver Function Tests: Recent Labs  Lab 03/01/21 1753 03/02/21 0130  AST 17 28  ALT 16 15  ALKPHOS 67 57  BILITOT 0.7 0.8  PROT 6.7 5.6*  ALBUMIN 3.6 3.1*   No results for input(s): LIPASE, AMYLASE in the last 168 hours. No results for input(s): AMMONIA in the last 168 hours. Coagulation Profile: Recent Labs  Lab 03/01/21 1753 03/02/21 0130  INR 1.0 2.1*   Cardiac Enzymes: No results for input(s): CKTOTAL,  CKMB, CKMBINDEX, TROPONINI in the last 168 hours. BNP (last 3 results) No results for input(s): PROBNP in the last 8760 hours. HbA1C: No results for input(s): HGBA1C in the last 72 hours. CBG: Recent Labs  Lab 03/01/21 2137 03/02/21 0410 03/02/21 0715 03/03/21 0719  GLUCAP 128* 153* 128* 82   Lipid Profile: No results for input(s): CHOL, HDL, LDLCALC, TRIG, CHOLHDL, LDLDIRECT in the last 72 hours. Thyroid Function Tests: Recent Labs    03/04/21 0109  TSH 1.674   Anemia Panel: No results for input(s): VITAMINB12, FOLATE, FERRITIN, TIBC, IRON, RETICCTPCT in the last 72 hours. Urine analysis:    Component Value  Date/Time   COLORURINE YELLOW 10/20/2015 1124   APPEARANCEUR CLOUDY (A) 10/20/2015 1124   LABSPEC 1.021 10/20/2015 1124   PHURINE 5.5 10/20/2015 1124   GLUCOSEU NEGATIVE 10/20/2015 1124   HGBUR NEGATIVE 10/20/2015 1124   BILIRUBINUR MODERATE (A) 10/20/2015 1124   KETONESUR >80 (A) 10/20/2015 1124   PROTEINUR NEGATIVE 10/20/2015 1124   UROBILINOGEN 1.0 10/20/2015 1124   NITRITE NEGATIVE 10/20/2015 1124   LEUKOCYTESUR NEGATIVE 10/20/2015 1124   Sepsis Labs: @LABRCNTIP (procalcitonin:4,lacticidven:4)  ) Recent Results (from the past 240 hour(s))  Resp Panel by RT-PCR (Flu A&B, Covid) Nasopharyngeal Swab     Status: None   Collection Time: 03/01/21  5:50 PM   Specimen: Nasopharyngeal Swab; Nasopharyngeal(NP) swabs in vial transport medium  Result Value Ref Range Status   SARS Coronavirus 2 by RT PCR NEGATIVE NEGATIVE Final    Comment: (NOTE) SARS-CoV-2 target nucleic acids are NOT DETECTED.  The SARS-CoV-2 RNA is generally detectable in upper respiratory specimens during the acute phase of infection. The lowest concentration of SARS-CoV-2 viral copies this assay can detect is 138 copies/mL. A negative result does not preclude SARS-Cov-2 infection and should not be used as the sole basis for treatment or other patient management decisions. A negative result may  occur with  improper specimen collection/handling, submission of specimen other than nasopharyngeal swab, presence of viral mutation(s) within the areas targeted by this assay, and inadequate number of viral copies(<138 copies/mL). A negative result must be combined with clinical observations, patient history, and epidemiological information. The expected result is Negative.  Fact Sheet for Patients:  EntrepreneurPulse.com.au  Fact Sheet for Healthcare Providers:  IncredibleEmployment.be  This test is no t yet approved or cleared by the [place] FDA and  has been authorized for detection and/or diagnosis of SARS-CoV-2 by FDA under an Emergency Use Authorization (EUA). This EUA will remain  in effect (meaning this test can be used) for the duration of the COVID-19 declaration under Section 564(b)(1) of the Act, 21 U.S.C.section 360bbb-3(b)(1), unless the authorization is terminated  or revoked sooner.       Influenza A by PCR NEGATIVE NEGATIVE Final   Influenza B by PCR NEGATIVE NEGATIVE Final    Comment: (NOTE) The Xpert Xpress SARS-CoV-2/FLU/RSV plus assay is intended as an aid in the diagnosis of influenza from Nasopharyngeal swab specimens and should not be used as a sole basis for treatment. Nasal washings and aspirates are unacceptable for Xpert Xpress SARS-CoV-2/FLU/RSV testing.  Fact Sheet for Patients: EntrepreneurPulse.com.au  Fact Sheet for Healthcare Providers: IncredibleEmployment.be  This test is not yet approved or cleared by the [place] FDA and has been authorized for detection and/or diagnosis of SARS-CoV-2 by FDA under an Emergency Use Authorization (EUA). This EUA will remain in effect (meaning this test can be used) for the duration of the COVID-19 declaration under Section 564(b)(1) of the Act, 21 U.S.C. section 360bbb-3(b)(1), unless the authorization is terminated  or revoked.  Performed at [facility], [place] [address] [place], [place] [address]   Blood culture (routine x 2)     Status: Abnormal   Collection Time: 03/01/21  6:00 PM   Specimen: BLOOD  Result Value Ref Range Status   Specimen Description BLOOD RIGHT ANTECUBITAL  Final   Special Requests   Final    BOTTLES DRAWN AEROBIC AND ANAEROBIC Blood Culture adequate volume   Culture  Setup Time   Final    GRAM POSITIVE COCCI AEROBIC BOTTLE ONLY CRITICAL RESULT CALLED TO, READ BACK BY  AND VERIFIED WITH: C,PIERCE PHARMD @1400  03/02/21 EB    Culture (A)  Final    STAPHYLOCOCCUS HOMINIS THE SIGNIFICANCE OF ISOLATING THIS ORGANISM FROM A SINGLE SET OF BLOOD CULTURES WHEN MULTIPLE SETS ARE DRAWN IS UNCERTAIN. PLEASE NOTIFY THE MICROBIOLOGY DEPARTMENT WITHIN ONE WEEK IF SPECIATION AND SENSITIVITIES ARE REQUIRED. Performed at [facility], [place] [address] [place], [place] [address]    Report Status 03/04/2021 FINAL  Final  Blood Culture ID Panel (Reflexed)     Status: Abnormal   Collection Time: 03/01/21  6:00 PM  Result Value Ref Range Status   Enterococcus faecalis NOT DETECTED NOT DETECTED Final   Enterococcus Faecium NOT DETECTED NOT DETECTED Final   Listeria monocytogenes NOT DETECTED NOT DETECTED Final   Staphylococcus species DETECTED (A) NOT DETECTED Final    Comment: CRITICAL RESULT CALLED TO, READ BACK BY AND VERIFIED WITH: C,PIERCE PHARMD @1400  03/02/21 EB    Staphylococcus aureus (BCID) NOT DETECTED NOT DETECTED Final   Staphylococcus epidermidis NOT DETECTED NOT DETECTED Final   Staphylococcus lugdunensis NOT DETECTED NOT DETECTED Final   Streptococcus species NOT DETECTED NOT DETECTED Final   Streptococcus agalactiae NOT DETECTED NOT DETECTED Final   Streptococcus pneumoniae NOT DETECTED NOT DETECTED Final   Streptococcus pyogenes NOT DETECTED NOT DETECTED Final   A.calcoaceticus-baumannii NOT DETECTED NOT DETECTED Final   Bacteroides fragilis NOT DETECTED NOT  DETECTED Final   Enterobacterales NOT DETECTED NOT DETECTED Final   Enterobacter cloacae complex NOT DETECTED NOT DETECTED Final   Escherichia coli NOT DETECTED NOT DETECTED Final   Klebsiella aerogenes NOT DETECTED NOT DETECTED Final   Klebsiella oxytoca NOT DETECTED NOT DETECTED Final   Klebsiella pneumoniae NOT DETECTED NOT DETECTED Final   Proteus species NOT DETECTED NOT DETECTED Final   Salmonella species NOT DETECTED NOT DETECTED Final   Serratia marcescens NOT DETECTED NOT DETECTED Final   Haemophilus influenzae NOT DETECTED NOT DETECTED Final   Neisseria meningitidis NOT DETECTED NOT DETECTED Final   Pseudomonas aeruginosa NOT DETECTED NOT DETECTED Final   Stenotrophomonas maltophilia NOT DETECTED NOT DETECTED Final   Candida albicans NOT DETECTED NOT DETECTED Final   Candida auris NOT DETECTED NOT DETECTED Final   Candida glabrata NOT DETECTED NOT DETECTED Final   Candida krusei NOT DETECTED NOT DETECTED Final   Candida parapsilosis NOT DETECTED NOT DETECTED Final   Candida tropicalis NOT DETECTED NOT DETECTED Final   Cryptococcus neoformans/gattii NOT DETECTED NOT DETECTED Final    Comment: Performed at [facility], [place] [address], [place], [place] [address]  MRSA PCR Screening     Status: None   Collection Time: 03/01/21 10:03 PM   Specimen: Nasal Mucosa; Nasopharyngeal  Result Value Ref Range Status   MRSA by PCR NEGATIVE NEGATIVE Final    Comment:        The GeneXpert MRSA Assay (FDA approved for NASAL specimens only), is one component of a comprehensive MRSA colonization surveillance program. It is not intended to diagnose MRSA infection nor to guide or monitor treatment for MRSA infections. Performed at [facility], [place] [address] [place], [place] [address]   Blood culture (routine x 2)     Status: None (Preliminary result)   Collection Time: 03/01/21 10:53 PM   Specimen: BLOOD  Result Value Ref Range Status   Specimen Description BLOOD SITE  NOT SPECIFIED  Final   Special Requests   Final    BOTTLES DRAWN AEROBIC ONLY Blood Culture results may not be optimal due  to an inadequate volume of blood received in culture bottles   Culture   Final    NO GROWTH 1 DAY Performed at [facility], [place] [address] [place], [place] [address]    Report Status PENDING  Incomplete      Radiology Studies: ECHOCARDIOGRAM COMPLETE BUBBLE STUDY  Result Date: 03/02/2021    ECHOCARDIOGRAM REPORT   Patient Name:   KADEY MIHALIC Date of Exam: 03/02/2021 Medical Rec #:  [id number]       Height:       64.0 in Accession #:    [id number]      Weight:       141.1 lb Date of Birth:  26-Mar-1941        BSA:          1.687 m Patient Age:    56 years        BP:           103/69 mmHg Patient Gender: F               HR:           61 bpm. Exam Location:  Inpatient Procedure: 2D Echo, Cardiac Doppler and Color Doppler Indications:     PFO  History:         Patient has prior history of Echocardiogram examinations, most                  recent 10/17/2015. Pulmonary embolus.  Sonographer:     Clayton Lefort RDCS (AE) Referring Phys:  [place] Diagnosing Phys: Buford Dresser MD IMPRESSIONS  1. Left ventricular ejection fraction, by estimation, is 60 to 65%. The left ventricle has normal function. The left ventricle has no regional wall motion abnormalities. There is moderate concentric left ventricular hypertrophy. Left ventricular diastolic parameters were normal.  2. Right ventricular systolic function is mildly reduced. The right ventricular size is mildly enlarged. There is normal pulmonary artery systolic pressure.  3. No clear color doppler flow seen across intraatrial septum, but limited images. Rapidly, vigorously positive bubble study (image 87) suggests significant right to left shunt present. Agitated saline contrast bubble study was positive with shunting observed within 3-6 cardiac cycles suggestive of interatrial shunt.  4. Right atrial size was mildly  dilated.  5. The mitral valve is normal in structure. Mild mitral valve regurgitation. No evidence of mitral stenosis.  6. The aortic valve is tricuspid. Aortic valve regurgitation is not visualized. No aortic stenosis is present. Comparison(s): No significant change from prior study. 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 There is hypermobility of the septum, but color doppler images are limited for assessment of left to right shunt. Prior TEE in  2016 noted PFO with positive bubble study. Findings communicated to primary inpatient team. FINDINGS  Left Ventricle: Left ventricular ejection fraction, by estimation, is 60 to 65%. The left ventricle has normal function. The left ventricle has no regional wall motion abnormalities. The left ventricular internal cavity size was normal in size. There is  moderate concentric left ventricular hypertrophy. Left ventricular diastolic parameters were normal. Right Ventricle: The right ventricular size is mildly enlarged. Right vetricular wall thickness was not well visualized. Right ventricular systolic function is mildly reduced. There is normal pulmonary artery systolic pressure. The tricuspid regurgitant velocity is 1.91 m/s, and with an assumed right atrial pressure of 8 mmHg, the estimated right ventricular systolic pressure is 40.9 mmHg. Left  Atrium: Left atrial size was normal in size. Right Atrium: Right atrial size was mildly dilated. Pericardium: Trivial pericardial effusion is present. Presence of pericardial fat pad. Mitral Valve: The mitral valve is normal in structure. There is mild thickening of the mitral valve leaflet(s). Mild mitral valve regurgitation. No evidence of mitral valve stenosis. MV peak gradient, 2.0 mmHg. The mean mitral valve gradient is 1.0 mmHg. Tricuspid Valve: The tricuspid valve is normal in structure. Tricuspid valve regurgitation is trivial. No  evidence of tricuspid stenosis. Aortic Valve: The aortic valve is tricuspid. Aortic valve regurgitation is not visualized. No aortic stenosis is present. Aortic valve mean gradient measures 2.0 mmHg. Aortic valve peak gradient measures 3.9 mmHg. Aortic valve area, by VTI measures 2.10 cm. Pulmonic Valve: The pulmonic valve was not well visualized. Pulmonic valve regurgitation is mild. No evidence of pulmonic stenosis. Aorta: The aortic root, ascending aorta and aortic arch are all structurally normal, with no evidence of dilitation or obstruction. IAS/Shunts: The interatrial septum is aneurysmal. The interatrial septum was not well visualized. Agitated saline contrast was given intravenously to evaluate for intracardiac shunting. Agitated saline contrast bubble study was positive with shunting observed within 3-6 cardiac cycles suggestive of interatrial shunt.  LEFT VENTRICLE PLAX 2D LVIDd:         4.00 cm  Diastology LVIDs:         2.60 cm  LV e' medial:    6.20 cm/s LV PW:         1.60 cm  LV E/e' medial:  8.9 LV IVS:        1.40 cm  LV e' lateral:   9.46 cm/s LVOT diam:     1.80 cm  LV E/e' lateral: 5.8 LV SV:         50 LV SV Index:   30 LVOT Area:     2.54 cm  RIGHT VENTRICLE             IVC RV Basal diam:  3.00 cm     IVC diam: 0.60 cm RV S prime:     12.70 cm/s TAPSE (M-mode): 1.7 cm LEFT ATRIUM           Index       RIGHT ATRIUM           Index LA diam:      3.50 cm 2.07 cm/m  RA Area:     14.00 cm LA Vol (A2C): 33.6 ml 19.92 ml/m RA Volume:   34.20 ml  20.28 ml/m LA Vol (A4C): 27.8 ml 16.48 ml/m  AORTIC VALVE AV Area (Vmax):    2.37 cm AV Area (Vmean):   2.06 cm AV Area (VTI):     2.10 cm AV Vmax:           98.20 cm/s AV Vmean:          74.200 cm/s AV VTI:            0.237 m AV Peak Grad:      3.9 mmHg AV Mean Grad:      2.0 mmHg LVOT Vmax:         91.40 cm/s LVOT Vmean:        60.200 cm/s LVOT VTI:          0.196 m LVOT/AV VTI ratio: 0.83  AORTA Ao Root diam: 3.30 cm Ao Asc diam:  3.10 cm MITRAL  VALVE               TRICUSPID  VALVE MV Area (PHT): 5.13 cm    TR Peak grad:   14.6 mmHg MV Area VTI:   2.03 cm    TR Vmax:        191.00 cm/s MV Peak grad:  2.0 mmHg MV Mean grad:  1.0 mmHg    SHUNTS MV Vmax:       0.70 m/s    Systemic VTI:  0.20 m MV Vmean:      36.9 cm/s   Systemic Diam: 1.80 cm MV Decel Time: 148 msec MV E velocity: 55.30 cm/s MV A velocity: 63.80 cm/s MV E/A ratio:  0.87 Buford Dresser MD Electronically signed by Buford Dresser MD Signature Date/Time: 03/02/2021/3:11:36 PM    Final (Updated)    VAS [place] LOWER EXTREMITY VENOUS (DVT)  Result Date: 03/03/2021  Lower Venous DVT Study Indications: Pulmonary embolism.  Comparison Study: 10/17/2015 - negative Performing Technologist: Rogelia Rohrer  Examination Guidelines: A complete evaluation includes B-mode imaging, spectral Doppler, color Doppler, and power Doppler as needed of all accessible portions of each vessel. Bilateral testing is considered an integral part of a complete examination. Limited examinations for reoccurring indications may be performed as noted. The reflux portion of the exam is performed with the patient in reverse Trendelenburg.  +---------+---------------+---------+-----------+----------+--------------+ RIGHT    CompressibilityPhasicitySpontaneityPropertiesThrombus Aging +---------+---------------+---------+-----------+----------+--------------+ CFV      Full           Yes      Yes                                 +---------+---------------+---------+-----------+----------+--------------+ SFJ      Full                                                        +---------+---------------+---------+-----------+----------+--------------+ FV Prox  Full           Yes      Yes                                 +---------+---------------+---------+-----------+----------+--------------+ FV Mid   Full           Yes      Yes                                  +---------+---------------+---------+-----------+----------+--------------+ FV DistalFull           Yes      Yes                                 +---------+---------------+---------+-----------+----------+--------------+ PFV      Full                                                        +---------+---------------+---------+-----------+----------+--------------+ POP      Full           Yes      Yes                                 +---------+---------------+---------+-----------+----------+--------------+  PTV      Full                                                        +---------+---------------+---------+-----------+----------+--------------+ PERO     Full                                                        +---------+---------------+---------+-----------+----------+--------------+   +---------+---------------+---------+-----------+----------+-----------------+ LEFT     CompressibilityPhasicitySpontaneityPropertiesThrombus Aging    +---------+---------------+---------+-----------+----------+-----------------+ CFV      Partial        Yes      Yes                  Age Indeterminate +---------+---------------+---------+-----------+----------+-----------------+ SFJ      Partial                                      Age Indeterminate +---------+---------------+---------+-----------+----------+-----------------+ FV Prox  Partial        Yes      Yes                  Age Indeterminate +---------+---------------+---------+-----------+----------+-----------------+ FV Mid   Full           Yes      Yes                                    +---------+---------------+---------+-----------+----------+-----------------+ FV DistalFull           Yes      Yes                                    +---------+---------------+---------+-----------+----------+-----------------+ PFV      Full           Yes      Yes                                     +---------+---------------+---------+-----------+----------+-----------------+ POP      Full           Yes      Yes                                    +---------+---------------+---------+-----------+----------+-----------------+ PTV      Full                                                           +---------+---------------+---------+-----------+----------+-----------------+ PERO     Full                                                           +---------+---------------+---------+-----------+----------+-----------------+  Summary: BILATERAL: -No evidence of popliteal cyst, bilaterally. RIGHT: - There is no evidence of deep vein thrombosis in the lower extremity. - There is no evidence of superficial venous thrombosis.  LEFT: - Findings consistent with age indeterminate non occlusive deep vein thrombosis involving the SF junction, left common femoral vein, and proximal femoral vein.  *See table(s) above for measurements and observations. Electronically signed by Ruta Hinds MD on 03/03/2021 at 9:18:20 PM.    Final      Scheduled Meds: . apixaban  10 mg Oral BID   Followed by  . [START ON 03/11/2021] apixaban  5 mg Oral BID  . atorvastatin  20 mg Oral Daily  . buPROPion  150 mg Oral Daily  . Chlorhexidine Gluconate Cloth  6 each Topical Daily  . mouth rinse  15 mL Mouth Rinse BID  . propranolol  10 mg Oral BID   Continuous Infusions: . heparin 700 Units/hr (03/04/21 1000)  . lactated ringers 75 mL/hr at 03/04/21 1000     LOS: 3 days   Time Spent in minutes   45 minutes  Saniyya Gau D.O. on 03/04/2021 at 10:53 AM  Between 7am to 7pm - Please see pager noted on [email or web address]  After 7pm go to [email or web address]  And look for the night coverage person covering for me after hours  [facility] Office  [phone]

## 2021-03-04 NOTE — Progress Notes (Addendum)
ANTICOAGULATION CONSULT NOTE - Follow Up Consult  Pharmacy Consult for heparin Indication: pulmonary embolus  Labs: Recent Labs    03/01/21 1753 03/01/21 1802 03/01/21 1927 03/01/21 2147 03/02/21 0130 03/02/21 0440 03/02/21 0443 03/02/21 1423 03/02/21 1607 03/03/21 0139 03/03/21 1055 03/03/21 1104 03/03/21 1852 03/04/21 0109  HGB 12.7 13.3  --  11.6* 12.0  --  10.5*  --   --  10.5*  --   --   --  9.0*  HCT 40.2 39.0  --  33.8* 36.4  --  31.0*  --   --  31.2*  --   --   --  27.1*  PLT 244  --   --  186 203  --   --   --   --  182  --   --   --  197  APTT  --   --   --   --  120* 50*  --   --  199*  --   --   --   --   --   LABPROT 12.7  --   --   --  23.2*  --   --   --   --   --   --   --   --   --   INR 1.0  --   --   --  2.1*  --   --   --   --   --   --   --   --   --   HEPARINUNFRC  --   --   --   --   --   --   --    < > 1.43* 0.50  --  0.30 0.32 0.22*  CREATININE 1.33* 1.30*  --   --  1.25*  --   --   --   --  1.48*  --   --   --   --   TROPONINIHS 491*  --    < > 2,351*  --  7,090*  --   --   --   --  1,698*  --   --   --    < > = values in this interval not displayed.    Assessment:  27 yof withhx CVA in 2016 presenting with severe SOB and syncope - now on BiPAP. CTA finding extensive bilateral PE with RV/LV ratio 1.67. Did receive dose of enoxaparin at 2111.   Received tPA given RV/LV ratio, low O2 saturations requiring BiPAP, and associated syncope after discussion with MD on 3/5 PM. Restarted on heparin infusion based on aPTT appropriately.  Pharmacy now consulted to transition from heparin to apixaban. RN reports bleeding from IV sites resolved.  Goal of Therapy:  Heparin level 0.3-0.7 units/ml   Plan:  Stop heparin and start apixaban 10mg  twice daily x 7 days followed by 5mg  twice daily  Stop heparin infusion when first dose of apixaban administered - RN aware Pharmacy to provide education  Cristela Felt, PharmD Clinical Pharmacist  03/04/2021,10:42 AM

## 2021-03-04 NOTE — Progress Notes (Signed)
ANTICOAGULATION CONSULT NOTE - Follow Up Consult  Pharmacy Consult for heparin Indication: pulmonary embolus  Labs: Recent Labs    03/01/21 1753 03/01/21 1802 03/01/21 1927 03/01/21 2147 03/02/21 0130 03/02/21 0440 03/02/21 0443 03/02/21 1423 03/02/21 1607 03/03/21 0139 03/03/21 1055 03/03/21 1104 03/03/21 1852 03/04/21 0109  HGB 12.7 13.3  --  11.6* 12.0  --  10.5*  --   --  10.5*  --   --   --  9.0*  HCT 40.2 39.0  --  33.8* 36.4  --  31.0*  --   --  31.2*  --   --   --  27.1*  PLT 244  --   --  186 203  --   --   --   --  182  --   --   --  197  APTT  --   --   --   --  120* 50*  --   --  199*  --   --   --   --   --   LABPROT 12.7  --   --   --  23.2*  --   --   --   --   --   --   --   --   --   INR 1.0  --   --   --  2.1*  --   --   --   --   --   --   --   --   --   HEPARINUNFRC  --   --   --   --   --   --   --    < > 1.43* 0.50  --  0.30 0.32 0.22*  CREATININE 1.33* 1.30*  --   --  1.25*  --   --   --   --  1.48*  --   --   --   --   TROPONINIHS 491*  --    < > 2,351*  --  7,090*  --   --   --   --  1,698*  --   --   --    < > = values in this interval not displayed.    Assessment: 80yo female now subtherapeutic on heparin after two levels at low end of goal; no gtt issues or further signs of bleeding per RN.  Goal of Therapy:  Heparin level 0.3-0.7 units/ml   Plan:  Will increase heparin gtt cautiously by 1-2 units/kg/hr to 700 units/hr and check level in 8 hours.    Wynona Neat, PharmD, BCPS  03/04/2021,2:29 AM

## 2021-03-04 NOTE — Progress Notes (Addendum)
Transferred -in from  81M by [facility] and alert. Reported by RN that she vomited while transferring her to wheel chair. MD made aware with order.

## 2021-03-05 DIAGNOSIS — R0902 Hypoxemia: Secondary | ICD-10-CM

## 2021-03-05 LAB — CBC
HCT: 26.3 % — ABNORMAL LOW (ref 36.0–46.0)
Hemoglobin: 8.9 g/dL — ABNORMAL LOW (ref 12.0–15.0)
MCH: 30.9 pg (ref 26.0–34.0)
MCHC: 33.8 g/dL (ref 30.0–36.0)
MCV: 91.3 fL (ref 80.0–100.0)
Platelets: 183 10*3/uL (ref 150–400)
RBC: 2.88 MIL/uL — ABNORMAL LOW (ref 3.87–5.11)
RDW: 13.4 % (ref 11.5–15.5)
WBC: 5.8 10*3/uL (ref 4.0–10.5)
nRBC: 0 % (ref 0.0–0.2)

## 2021-03-05 LAB — URINALYSIS, ROUTINE W REFLEX MICROSCOPIC
Bacteria, UA: NONE SEEN
Bilirubin Urine: NEGATIVE
Glucose, UA: NEGATIVE mg/dL
Hgb urine dipstick: NEGATIVE
Ketones, ur: 5 mg/dL — AB
Leukocytes,Ua: NEGATIVE
Nitrite: POSITIVE — AB
Protein, ur: NEGATIVE mg/dL
Specific Gravity, Urine: 1.008 (ref 1.005–1.030)
pH: 7 (ref 5.0–8.0)

## 2021-03-05 LAB — BASIC METABOLIC PANEL
Anion gap: 10 (ref 5–15)
BUN: 11 mg/dL (ref 8–23)
CO2: 22 mmol/L (ref 22–32)
Calcium: 8.5 mg/dL — ABNORMAL LOW (ref 8.9–10.3)
Chloride: 107 mmol/L (ref 98–111)
Creatinine, Ser: 1.14 mg/dL — ABNORMAL HIGH (ref 0.44–1.00)
GFR, Estimated: 49 mL/min — ABNORMAL LOW (ref >60)
Glucose, Bld: 83 mg/dL (ref 70–99)
Potassium: 3.9 mmol/L (ref 3.5–5.1)
Sodium: 139 mmol/L (ref 135–145)

## 2021-03-05 MED ORDER — CEPHALEXIN 500 MG PO CAPS
500.0000 mg | ORAL_CAPSULE | Freq: Two times a day (BID) | ORAL | Status: DC
  Administered 2021-03-05 – 2021-03-07 (×4): 500 mg via ORAL
  Filled 2021-03-05 (×4): qty 1

## 2021-03-05 NOTE — Plan of Care (Signed)

## 2021-03-05 NOTE — Hospital Course (Signed)
80 year old WF  Prior L MCA CVA-embolic-TEE + PFO 2919 = etiology status post loop recorder Palpitations HLD Right lung nodule Dysphagia post stroke 2016 ?  Unspecified GU malignancy??  Admit to [facility] 03/01/2021 from home respiratory distress, toxic metabolic encephalopathy, O2 sat 50% requiring NRB 15 L/M-placed on BiPAP-mottled cyanotic Admitted by critical care-etiology submassive PE Status post TPA 3/5 transitioned to an/C nasal cannula subsequently Subsequently transferred to hospitalist service 3/8  Age indeterminant submassive PE status post TPA on admission Syncope secondary to above PFO 2016+ R-->L shunt Normocytic anemia Depression anxiety tremors Hypothyroid Reflux ?  Vascular dementia   Data reviewed BUNs/creatinine 24/1.4-->11/1.1 Hemoglobin 9.0 WBC 5.8

## 2021-03-05 NOTE — Evaluation (Signed)
Physical Therapy Evaluation Patient Details Name: Nicole Ruiz MRN: [id number] DOB: [birth year] Today's Date: 03/05/2021   History of Present Illness  Pt is a 80 y/o female who was admitted 03/01/2021 with acute onset SOB and syncopal episode. She was found to have bilateral PE's with R heart strain s/p TPA. Of note pt was also found to have an age-indeterminate L DVT, AKI. PMH significant for  CVA, HTN, CKD, R femur IM nail 2016.  Clinical Impression  Pt admitted with above diagnosis. At the time of PT eval pt was complaining of nausea and was not able to tolerate attempts at EOB. Pt was agreeable to supine MMT and general physical therapy exam. Overall tolerance for any activity is decreased at this time, however pt reports she was up in the chair earlier and had walked to the bathroom this morning. Anticipate when nausea has subsided, pt will progress well. Will continue to follow acutely attempt to get a better picture of current functional status at the next available date.    Follow Up Recommendations Home health PT;Supervision for mobility/OOB    Equipment Recommendations  None recommended by PT    Recommendations for Other Services       Precautions / Restrictions Precautions Precautions: Fall Restrictions Weight Bearing Restrictions: No      Mobility  Bed Mobility Overal bed mobility: Needs Assistance Bed Mobility: Rolling Rolling: Supervision   Supine to sit: Min assist     General bed mobility comments: Heavy use of rails. Pt rolling to allow for bed pad to be placed under her. With railings and LE push up, pt was able to scoot up towards [facility] without assistance. Very slow and effortful for all movement.    Transfers Overall transfer level: Needs assistance Equipment used: Rolling walker (2 wheeled) Transfers: Sit to/from Stand Sit to Stand: Min assist         General transfer comment: MinA for power upto stand  Ambulation/Gait                Stairs             Wheelchair Mobility    Modified Rankin (Stroke Patients Only)       Balance Overall balance assessment: Needs assistance Sitting-balance support: Bilateral upper extremity supported Sitting balance-Leahy Scale: Fair     Standing balance support: Single extremity supported;During functional activity Standing balance-Leahy Scale: Poor Standing balance comment: Pt requiring assist for support with BUEs using RW and standing at sink with sungle UE supported for grooming.                             Pertinent Vitals/Pain Pain Assessment: Faces Faces Pain Scale: No hurt Pain Intervention(s): Monitored during session    Home Living Family/patient expects to be discharged to:: Private residence Living Arrangements: Spouse/significant other Available Help at Discharge: Family;Available 24 hours/day Type of Home: House Home Access: Level entry     Home Layout: One level;Other (Comment) (+2 steps to den to rest of house) Home Equipment: Gilford Rile - 2 wheels;Bedside commode;Wheelchair - manual      Prior Function Level of Independence: Independent               Hand Dominance   Dominant Hand: Right    Extremity/Trunk Assessment   Upper Extremity Assessment Upper Extremity Assessment: Generalized weakness;RUE deficits/detail;LUE deficits/detail RUE Deficits / Details: Bilaterally, gross strength and muscular endurance decreased. Pt able to engage in MMT  however not able to sustain a contraction very long. Grossly 4-/5 in shoulder flexion, biceps, triceps, pecs. MMT performed modified in supine.    Lower Extremity Assessment Lower Extremity Assessment: Generalized weakness;RLE deficits/detail;LLE deficits/detail RLE Deficits / Details: Bilaterally, gross strength and muscular endurance decreased. Pt able to engage in MMT however not able to sustain a contraction very long. Grossly 4-/5 in quads, hamstrings, hip flexors, ankle DF. MMT performed  modified in supine.    Cervical / Trunk Assessment Cervical / Trunk Assessment:  (Unable to assess)  Communication   Communication: No difficulties  Cognition Arousal/Alertness: Awake/alert Behavior During Therapy: Flat affect Overall Cognitive Status: Within Functional Limits for tasks assessed                                 General Comments: A/O x4; Pt's PLOF info was correct and confirmed by spouse.      General Comments General comments (skin integrity, edema, etc.): BLE edema    Exercises     Assessment/Plan    PT Assessment Patient needs continued PT services  PT Problem List Decreased strength;Decreased activity tolerance;Decreased balance;Decreased mobility;Decreased knowledge of use of DME;Decreased safety awareness;Decreased knowledge of precautions;Cardiopulmonary status limiting activity       PT Treatment Interventions DME instruction;Gait training;Functional mobility training;Stair training;Therapeutic activities;Balance training;Therapeutic exercise;Neuromuscular re-education;Patient/family education    PT Goals (Current goals can be found in the Care Plan section)  Acute Rehab PT Goals Patient Stated Goal: For her stomach to feel better. PT Goal Formulation: With patient Time For Goal Achievement: 03/19/21 Potential to Achieve Goals: Good    Frequency Min 3X/week   Barriers to discharge        Co-evaluation               AM-PAC PT "6 Clicks" Mobility  Outcome Measure Help needed turning from your back to your side while in a flat bed without using bedrails?: A Little Help needed moving from lying on your back to sitting on the side of a flat bed without using bedrails?: A Little Help needed moving to and from a bed to a chair (including a wheelchair)?: A Little Help needed standing up from a chair using your arms (e.g., wheelchair or bedside chair)?: A Little Help needed to walk in hospital room?: A Little Help needed climbing  3-5 steps with a railing? : A Lot 6 Click Score: 17    End of Session   Activity Tolerance: Patient limited by fatigue;Other (comment) (Limited by nausea) Patient left: in bed;with call bell/phone within reach;with bed alarm set;with nursing/sitter in room Nurse Communication: Mobility status;Other (comment) (RN present and providing nausea meds at end of session.) PT Visit Diagnosis: Unsteadiness on feet (R26.81);Muscle weakness (generalized) (M62.81)    Time: 4431-5400 PT Time Calculation (min) (ACUTE ONLY): 33 min   Charges:   PT Evaluation $PT Eval Moderate Complexity: 1 Mod PT Treatments $Therapeutic Activity: 8-22 mins        Rolinda Roan, PT, DPT Acute Rehabilitation Services Pager: [phone] Office: [place] 03/05/2021, 2:30 PM

## 2021-03-05 NOTE — Evaluation (Signed)
Occupational Therapy Evaluation Patient Details Name: Nicole Ruiz MRN: [id number] DOB: [birth year] Today's Date: 03/05/2021    History of Present Illness Pt is with severe low O2 saturation with syncopal episode found to have O2 sat in the low 40s to 60s, B/L . PMHx: tremor anxiety, HTN, HLD, CVAs.   Clinical Impression   Pt PTA: Pt living with spouse in home with 2 steps to den and main bath and bedroom. Pt usign rails in hallways at home, but curretnly using RW for stability. Pt supervisionA to minguardA for ADL and mobility. Pt currently performing own toilet hygiene in standing; standing at sink for grooming; pt ambulating in room and transferring with supervisionA to [place]. Pt's spouse in room. BLEs with some swelling; BP in sitting 108/72; standing 118/55;  (unable to standing x3 BP as pt needed to rest) BP in sitting after exertion 126/61 to conclude session. Pt reporting some dizziness in standing, but bP remained stable. Pt with BLE weakness in standing. OT to follow acutely.    Follow Up Recommendations  Home health OT    Equipment Recommendations  None recommended by OT    Recommendations for Other Services       Precautions / Restrictions Precautions Precautions: Fall Restrictions Weight Bearing Restrictions: No      Mobility Bed Mobility Overal bed mobility: Needs Assistance Bed Mobility: Supine to Sit     Supine to sit: Min assist     General bed mobility comments: MinA for trunk elevation to EOB    Transfers Overall transfer level: Needs assistance Equipment used: Rolling walker (2 wheeled) Transfers: Sit to/from Stand Sit to Stand: Min assist         General transfer comment: MinA for power upto stand    Balance Overall balance assessment: Needs assistance Sitting-balance support: Bilateral upper extremity supported Sitting balance-Leahy Scale: Fair     Standing balance support: Single extremity supported;During functional  activity Standing balance-Leahy Scale: Poor Standing balance comment: Pt requiring assist for support with BUEs using RW and standing at sink with sungle UE supported for grooming.                           ADL either performed or assessed with clinical judgement   ADL Overall ADL's : At baseline                                       General ADL Comments: Pt supervisionA level for ADL with RW for stability. Pt performing own toilet hygiene in standing; standing at sink for grooming; pt ambulating in room and transferring with supervisionA to [place]. Pt's spouse in room.     Vision Baseline Vision/History: No visual deficits Patient Visual Report: No change from baseline Vision Assessment?: No apparent visual deficits     Perception     Praxis      Pertinent Vitals/Pain Pain Assessment: Faces Faces Pain Scale: No hurt Pain Intervention(s): Monitored during session     Hand Dominance Right   Extremity/Trunk Assessment Upper Extremity Assessment Upper Extremity Assessment: Overall WFL for tasks assessed   Lower Extremity Assessment Lower Extremity Assessment: Generalized weakness   Cervical / Trunk Assessment Cervical / Trunk Assessment: Normal   Communication Communication Communication: No difficulties   Cognition Arousal/Alertness: Awake/alert Behavior During Therapy: WFL for tasks assessed/performed Overall Cognitive Status: Within Functional Limits for tasks assessed  General Comments: A/O x4; Pt's PLOF info was correct and confirmed by spouse.   General Comments  BLEs with some swelling; BP in sitting 108/72; standing 118/55; BP in sitting after exertion 126/61 to conclude session.    Exercises     Shoulder Instructions      Home Living Family/patient expects to be discharged to:: Private residence Living Arrangements: Spouse/significant other Available Help at Discharge:  Family;Available 24 hours/day Type of Home: House Home Access: Level entry     Home Layout: One level;Other (Comment) (+2 steps to den to rest of house)     Bathroom Shower/Tub: Tub/shower unit;Walk-in shower   Bathroom Toilet: Standard     Home Equipment: Environmental consultant - 2 wheels;Bedside commode;Wheelchair - manual          Prior Functioning/Environment Level of Independence: Independent                 OT Problem List: Decreased strength;Decreased activity tolerance;Impaired balance (sitting and/or standing);Decreased safety awareness;Increased edema;Cardiopulmonary status limiting activity      OT Treatment/Interventions:      OT Goals(Current goals can be found in the care plan section) Acute Rehab OT Goals Patient Stated Goal: to go home OT Goal Formulation: With patient Time For Goal Achievement: 03/19/21 Potential to Achieve Goals: Good ADL Goals Pt Will Perform Tub/Shower Transfer: rolling walker;ambulating;shower seat Additional ADL Goal #1: Pt will increase to modified independence with OOB ADL x6 mins of standing with no cues for safety.  OT Frequency: Min 2X/week   Barriers to D/C:            Co-evaluation              AM-PAC OT "6 Clicks" Daily Activity     Outcome Measure Help from another person eating meals?: None Help from another person taking care of personal grooming?: A Little Help from another person toileting, which includes using toliet, bedpan, or urinal?: A Little Help from another person bathing (including washing, rinsing, drying)?: None Help from another person to put on and taking off regular upper body clothing?: None Help from another person to put on and taking off regular lower body clothing?: A Little 6 Click Score: 21   End of Session Equipment Utilized During Treatment: Gait belt;Rolling walker Nurse Communication: Mobility status  Activity Tolerance: Patient tolerated treatment well Patient left: in chair;with call  bell/phone within reach;with chair alarm set;with family/visitor present  OT Visit Diagnosis: Unsteadiness on feet (R26.81);Muscle weakness (generalized) (M62.81)                Time: 1059-1130 OT Time Calculation (min): 31 min Charges:  OT General Charges $OT Visit: 1 Visit OT Evaluation $OT Eval Moderate Complexity: 1 Mod OT Treatments $Self Care/Home Management : 8-22 mins  Jefferey Pica, OTR/L Acute Rehabilitation Services Pager: [phone] Office: [place] 03/05/2021, 2:04 PM

## 2021-03-05 NOTE — Care Management Important Message (Signed)
Important Message  Patient Details  Name: Nicole Ruiz MRN: [id number] Date of Birth: 1941-11-01   Medicare Important Message Given:  Yes     Orbie Pyo 03/05/2021, 2:32 PM

## 2021-03-05 NOTE — Progress Notes (Addendum)
PROGRESS NOTE   Nicole Ruiz  YIR:[id number] DOB: 12-07-1941 DOA: 03/01/2021 PCP: Bernerd Limbo, MD  Brief Narrative:  80 year old WF  Prior L MCA CVA-embolic-TEE + PFO 5009 = etiology status post loop recorder Palpitations HLD Right lung nodule Dysphagia post stroke 2016 ?  Unspecified GU malignancy??  Admit to [facility] 03/01/2021 from home respiratory distress, toxic metabolic encephalopathy, O2 sat 50% requiring NRB 15 L/M-placed on BiPAP-mottled cyanotic Admitted by critical care-etiology submassive PE Status post TPA 3/5 transitioned to an/C nasal cannula subsequently Subsequently transferred to hospitalist service 3/8  Hospital-Problem based course  Age indeterminant submassive PE status post TPA on admission Syncope secondary to above On admit acute PE RV/LV 1.67-->TPA given--started heparin GTT--lower extremity duplex negative right side, left side nonocclusive age-indeterminate DVT Eliquis at least for 3 to 6 months  Nausea mild abdominal discomfort Did not eat much today and apparently does not eat much in general Monitor today for vomiting lack of flatus-consider x-ray if patient does vomit  Sinus bradycardia On propranolol for tremors which we have held from admission HTN magnesium  PFO 2016+ R-->L shunt TTE 36 = right ventricular enlargement-needs TEE in 6 to 12 weeks with cardiologist  Normocytic anemia Normocytic anemia  Depression anxiety tremors ?  Vascular dementia Continue Namenda, donepezil, primidone, Prozac Try to minimize Wellbutrin and Xanax Takes propranolol for tremors which we will discontinue for now  Polypharmacy Holding tramadol at this time holding meclizine at this time  Hypothyroid Continue Synthroid 50  Reflux   DVT prophylaxis: Apixaban Code Status: Full Family Communication: None present-telephone her husband Armstrong and updated Disposition:  Status is: Inpatient---liekly d/c am 3/10  Remains inpatient  appropriate because:Persistent severe electrolyte disturbances, Altered mental status, Ongoing diagnostic testing needed not appropriate for outpatient work up and IV treatments appropriate due to intensity of illness or inability to take PO   Dispo: The patient is from: Home              Anticipated d/c is to: Home              Patient currently is not medically stable to d/c.   Difficult to place patient Yes    Consultants:   assumed care 3/9  Procedures:   Antimicrobials:     Subjective: Awake slight abd discomfort no distress no n/v GIven Zofran by RN this am Also nursing reports some sinus brad   Objective: Vitals:   03/05/21 0312 03/05/21 0406 03/05/21 0725 03/05/21 1139  BP: 119/72  117/86 (!) 153/64  Pulse: 71  68 (!) 51  Resp: 19  17 13   Temp: 98 F (36.7 C)  (!) 97.2 F (36.2 C) 98 F (36.7 C)  TempSrc: Oral  Oral Oral  SpO2: 92%  95% 96%  Weight:  65 kg    Height:        Intake/Output Summary (Last 24 hours) at 03/05/2021 1527 Last data filed at 03/05/2021 0900 Gross per 24 hour  Intake 1689.37 ml  Output 30 ml  Net 1659.37 ml   Filed Weights   03/03/21 0330 03/04/21 0156 03/05/21 0406  Weight: 64.2 kg 63.7 kg 65 kg    Examination:  Awake flat affect no distress No icterus no pallor cta b no added sound no wheeze abd slight tender in lower quadrant no rebound found no guarding Neurologically intact no focal deficit Psychiatric euthymic but slight flat  Data Reviewed: personally reviewed   CBC    Component Value Date/Time   WBC  5.8 03/05/2021 0043   RBC 2.88 (L) 03/05/2021 0043   HGB 8.9 (L) 03/05/2021 0043   HCT 26.3 (L) 03/05/2021 0043   PLT 183 03/05/2021 0043   MCV 91.3 03/05/2021 0043   MCH 30.9 03/05/2021 0043   MCHC 33.8 03/05/2021 0043   RDW 13.4 03/05/2021 0043   LYMPHSABS 0.8 03/02/2021 0130   MONOABS 0.2 03/02/2021 0130   EOSABS 0.0 03/02/2021 0130   BASOSABS 0.0 03/02/2021 0130   CMP Latest Ref Rng & Units 03/05/2021  03/03/2021 03/02/2021  Glucose 70 - 99 mg/dL 83 92 -  BUN 8 - 23 mg/dL 11 24(H) -  Creatinine 0.44 - 1.00 mg/dL 1.14(H) 1.48(H) -  Sodium 135 - 145 mmol/L 139 137 136  Potassium 3.5 - 5.1 mmol/L 3.9 4.6 4.5  Chloride 98 - 111 mmol/L 107 106 -  CO2 22 - 32 mmol/L 22 20(L) -  Calcium 8.9 - 10.3 mg/dL 8.5(L) 8.9 -  Total Protein 6.5 - 8.1 g/dL - - -  Total Bilirubin 0.3 - 1.2 mg/dL - - -  Alkaline Phos 38 - 126 U/L - - -  AST 15 - 41 U/L - - -  ALT 0 - 44 U/L - - -     Radiology Studies: No results found.   Scheduled Meds: . apixaban  10 mg Oral BID   Followed by  . [START ON 03/11/2021] apixaban  5 mg Oral BID  . atorvastatin  20 mg Oral Daily  . buPROPion  150 mg Oral Daily  . Chlorhexidine Gluconate Cloth  6 each Topical Daily  . donepezil  10 mg Oral Daily  . FLUoxetine  40 mg Oral Daily  . levothyroxine  50 mcg Oral Q0600  . mouth rinse  15 mL Mouth Rinse BID  . memantine  10 mg Oral BID  . pantoprazole  40 mg Oral Daily  . primidone  50 mg Oral BID  . propranolol  10 mg Oral BID   Continuous Infusions: . lactated ringers 75 mL/hr at 03/05/21 0900     LOS: 4 days   Time spent: Gayle Mill, MD [facility] To contact the attending provider between 7A-7P or the covering provider during after hours 7P-7A, please log into the web site [email or web address] and access using universal [place] password for that web site. If you do not have the password, please call the hospital operator.  03/05/2021, 3:27 PM

## 2021-03-05 NOTE — TOC Progression Note (Signed)
Transition of Care [facility]) - Progression Note    Patient Details  Name: Nicole Ruiz MRN: [id number] Date of Birth: [birth year]  Transition of Care [facility]) CM/SW Contact  Zenon Mayo, RN Phone Number: 03/05/2021, 5:07 PM  Clinical Narrative:    Patient is from home, NCM offered choice, she states she does not have a preference . NCM made referral to [facility] with [facility] for [place], he states he can take referral.  Soc will begin 24 to 48 hrs post dc. NCM also informed her about the 47.00 copay for eliquis refills.  Will ask MD to send meds to [facility] at dc.         Expected Discharge Plan and Services                                                 Social Determinants of Health (SDOH) Interventions    Readmission Risk Interventions No flowsheet data found.

## 2021-03-06 ENCOUNTER — Other Ambulatory Visit: Payer: Self-pay | Admitting: Family Medicine

## 2021-03-06 MED ORDER — PROPRANOLOL HCL 10 MG PO TABS: 10.0000 mg | ORAL_TABLET | Freq: Two times a day (BID) | ORAL | Status: DC

## 2021-03-06 MED ORDER — APIXABAN 5 MG PO TABS: 5.0000 mg | ORAL_TABLET | Freq: Two times a day (BID) | ORAL | 3 refills | Status: DC

## 2021-03-06 MED ORDER — CEPHALEXIN 500 MG PO CAPS: 500.0000 mg | ORAL_CAPSULE | Freq: Two times a day (BID) | ORAL | 0 refills | Status: DC

## 2021-03-06 MED ORDER — APIXABAN 5 MG PO TABS: 10.0000 mg | ORAL_TABLET | Freq: Two times a day (BID) | ORAL | 0 refills | Status: DC

## 2021-03-06 MED FILL — CEPHALEXIN 500 MG CAPS: 500 | 2 days supply | Qty: 4 | Fill #0

## 2021-03-06 MED FILL — ELIQUIS 5 MG TABLET: 5 | 30 days supply | Qty: 74 | Fill #0

## 2021-03-06 NOTE — Evaluation (Signed)
Clinical/Bedside Swallow Evaluation Patient Details  Name: Nicole Ruiz MRN: [id number] Date of Birth: 1941-06-12  Today's Date: 03/06/2021 Time: SLP Start Time (ACUTE ONLY): 1300 SLP Stop Time (ACUTE ONLY): 1315 SLP Time Calculation (min) (ACUTE ONLY): 15 min  Past Medical History:  Past Medical History:  Diagnosis Date  . 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 Chronic kidney disease   . Depression   . Hyperlipidemia   . Hypertension   . Stroke [facility])    Past Surgical History:  Past Surgical History:  Procedure Laterality Date  . ABDOMINAL HYSTERECTOMY    . ARM SURGERY    . CARDIAC CATHETERIZATION  2013   negative  . EP IMPLANTABLE DEVICE N/A 10/17/2015   Procedure: Loop Recorder Insertion;  Surgeon: Will Meredith Leeds, MD;  Location: [facility];  Service: Cardiovascular;  Laterality: N/A;  . EYE SURGERY    . FEMUR IM NAIL Right 10/20/2015   Procedure: INTRAMEDULLARY (IM) RETROGRADE FEMORAL NAILING;  Surgeon: Tania Ade, MD;  Location: [facility];  Service: Orthopedics;  Laterality: Right;  . HEMORROIDECTOMY    . KNEE SURGERY    . TEE WITHOUT CARDIOVERSION N/A 10/17/2015   Procedure: TRANSESOPHAGEAL ECHOCARDIOGRAM (TEE);  Surgeon: Jerline Pain, MD;  Location: [facility] ENDOSCOPY;  Service: Cardiovascular;  Laterality: N/A;   HPI:  Pt is a 80 y/o female who was admitted 03/01/2021 with acute onset SOB and syncopal episode. She was found to have bilateral PE's with R heart strain s/p TPA. Of note pt was also found to have an age-indeterminate L DVT, AKI. PMH significant for  CVA, HTN, CKD, R femur IM nail 2016. Pt with nausea yesterday and episode of vomiting this am. Husband, at bedside, reports minimal intake since admission.   Assessment / Plan / Recommendation Clinical Impression  Pt presents with a functional oropharyngeal swallow with no clinical indication of a biomechanical deficit nor esophageal issues.  She has had minimal motivation to eat according to Mr.  Ammar.  However, during assessment she consumed water, pudding, and cracker with adequate attention and manipulation, palpable swallow response, no s/s of aspiration, and no clinical signs of residue or esophageal backflow.  Oral mechanism exam was WNL.  No dysphagia identified. Continue current diet; SLP service will sign off. SLP Visit Diagnosis: Dysphagia, unspecified (R13.10)    Aspiration Risk  No limitations    Diet Recommendation   regular solids, thin liquids  Medication Administration: Whole meds with liquid    Other  Recommendations Oral Care Recommendations: Oral care BID   Follow up Recommendations None      Frequency and Duration   n/a         Prognosis    n/a    Swallow Study   General HPI: Pt is a 80 y/o female who was admitted 03/01/2021 with acute onset SOB and syncopal episode. She was found to have bilateral PE's with R heart strain s/p TPA. Of note pt was also found to have an age-indeterminate L DVT, AKI. PMH significant for  CVA, HTN, CKD, R femur IM nail 2016. Pt with nausea yesterday and episode of vomiting this am. Husband, at bedside, reports minimal intake since admission. Type of Study: Bedside Swallow Evaluation Previous Swallow Assessment: none Diet Prior to this Study: Regular;Thin liquids Temperature Spikes Noted: No Respiratory Status: Room air History of Recent Intubation: No Behavior/Cognition: Alert Oral Cavity Assessment: Within Functional Limits Oral Care Completed by SLP: No Oral Cavity - Dentition: Dentures, bottom;Dentures, top Vision:  Functional for self-feeding Self-Feeding Abilities: Able to feed self Patient Positioning: Upright in bed Baseline Vocal Quality: Normal Volitional Swallow: Able to elicit    Oral/Motor/Sensory Function Overall Oral Motor/Sensory Function: Within functional limits   Ice Chips Ice chips: Within functional limits   Thin Liquid Thin Liquid: Within functional limits    Nectar Thick Nectar Thick Liquid:  Not tested   Honey Thick Honey Thick Liquid: Not tested   Puree Puree: Within functional limits   Solid     Solid: Within functional limits      Juan Quam Laurice 03/06/2021,1:19 PM  Estill Bamberg L. Tivis Ringer, [place] Office number [phone] Pager [phone]

## 2021-03-06 NOTE — Discharge Summary (Signed)
Physician Discharge Summary  Nicole Ruiz KCL:[id number] DOB: Nov 01, 1941 DOA: 03/01/2021  PCP: Bernerd Limbo, MD  Admit date: 03/01/2021 Discharge date: 03/06/2021  Time spent: 37 minutes  Recommendations for Outpatient Follow-up:  1. Requires outpatient Chem-7 CBC in 1 week 2. Recommend outpatient goals of care given moderate dementia 3. Para medicine engaged to get physical therapy involved patient will also need nursing case management at home to help with medication management as she gets her meds to herself and has underlying mild dementia 4. Err on the side of Eliquis probably 3 months given dementia and noncon commitment aspirin for prior stroke 5. Needs TEE 6 to 12 weeks with cardiologist if in keeping with patient's personal wishes 6. 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  Unspecified GU malignancy??  Admit to [facility] 03/01/2021 from home respiratory distress, toxic metabolic encephalopathy, O2 sat 50% requiring NRB 15 L/M-placed on BiPAP-mottled cyanotic Admitted by critical care-etiology submassive PE Status post TPA 3/5 transitioned to an/C nasal cannula subsequently Subsequently transferred to hospitalist service 3/8  Hospital Course:  Age indeterminant submassive PE status post TPA on admission Syncope secondary to above On admit acute PE RV/LV 1.67-->TPA  given--started heparin GTT--lower extremity duplex negative right side, left side nonocclusive age-indeterminate DVT Eliquis at least for 3 to 6 months  Nausea mild abdominal discomfort Did not eat much today and apparently does not eat much in general Speech therapy saw her and cleared her for diet She is not having any other symptoms at this time  Possible cystitis UA performed positive leukocytes however no culture done unfortunately Cover with Keflex for 3 days in the outpatient can complete  Sinus bradycardia On propranolol for tremors which was adjusted downwards to a lower dose  PFO 2016+ R-->L shunt TTE 36 = right ventricular enlargement-needs TEE in 6 to 12 weeks with cardiologist  Normocytic anemia Normocytic anemia  Depression anxiety tremors ?  Vascular dementia Continue Namenda, donepezil, primidone, Prozac Try to minimize Wellbutrin and Xanax  Polypharmacy Holding tramadol at this time holding meclizine at this time  Hypothyroid Continue Synthroid 50   Procedures:  CT chest  Echo  Consultations:  Assumed care from critical care  Discharge Exam: Vitals:   03/06/21 1100 03/06/21 1555  BP: (!) 148/72 (!) 169/74  Pulse: (!) 54 (!) 55  Resp: 13 14  Temp: 98.4 F (36.9 C) 98.1 F (36.7 C)  SpO2: 92% 95%    Subj on day of d/c   Awake alert coherent no distress had mild nausea this morning Is more coherent than she was yesterday Does not have as much abdominal pain  General Exam on discharge  EOMI NCAT no CTA B No added sound no rales no rhonchi Abdomen soft nontender no rebound no guarding No lower extremity edema Psychiatric euthymic pleasant mildly incoherent at times No rash    Discharge Instructions  Discharge Instructions    Diet - low sodium heart healthy   Complete by: As directed    Discharge instructions   Complete by: As directed    Finish the antibiotics completely You will notice that u will be on a blood thinnner  at 2 different doses--hopefully u can use this blood thinner for at least 3-6 months Make sure that if u have bleeding or dark stool that you report it You should resume your aspirin   Increase activity slowly   Complete by: As directed      Allergies as of 03/06/2021   No Known Allergies     Medication List    STOP taking these medications   Acetaminophen-Codeine 300-15 MG Tabs   Calcium Carb-Ergocalciferol 500-200 MG-UNIT Tabs   meclizine 12.5 MG tablet Commonly known as: ANTIVERT   primidone 50 MG tablet Commonly known as: MYSOLINE   traMADol 50 MG tablet Commonly known as: ULTRAM     TAKE these medications   ALPRAZolam 0.5 MG 24 hr tablet Commonly known as: Xanax XR Take 1 tablet (0.5 mg total) by mouth daily.   apixaban 5 MG Tabs tablet Commonly known as: ELIQUIS Take 2 tablets (10 mg total) by mouth 2 (two) times daily for 5 days.   apixaban 5 MG Tabs tablet Commonly known as: ELIQUIS Take 1 tablet (5 mg total) by mouth 2 (two) times daily. Start taking on: March 11, 2021   aspirin EC 81 MG tablet Take by mouth.   atorvastatin 20 MG tablet Commonly known as: LIPITOR Take 20 mg by mouth daily.   buPROPion 150 MG 24 hr tablet Commonly known as: WELLBUTRIN XL Take by mouth.   cephALEXin 500 MG capsule Commonly known as: KEFLEX Take 1 capsule (500 mg total) by mouth every 12 (twelve) hours.   donepezil 10 MG tablet Commonly known as: ARICEPT Take 10 mg by mouth daily.   Fish Oil 1000 MG Caps Take by mouth.   FLUoxetine 40 MG capsule Commonly known as: PROZAC Take 1 capsule (40 mg total) by mouth daily.   levothyroxine 50 MCG tablet Commonly known as: SYNTHROID   memantine 10 MG tablet Commonly known as: NAMENDA Take 10 mg by mouth 2 (two) times daily.   omeprazole 20 MG tablet Commonly known as: PRILOSEC OTC Take 20 mg by mouth daily.   polyvinyl alcohol 1.4 % ophthalmic solution Commonly known as: LIQUIFILM TEARS Place 1 drop into  both eyes every hour as needed for dry eyes.   propranolol 10 MG tablet Commonly known as: INDERAL Take 1 tablet (10 mg total) by mouth 2 (two) times daily. What changed:   medication strength  how much to take   UNABLE TO FIND Mega red take one daily   VITAMIN B 12 PO Take 1,000 mg by mouth.   Vitamin D (Ergocalciferol) 1.25 MG (50000 UNIT) Caps capsule Commonly known as: DRISDOL Take 50,000 Units by mouth every 14 (fourteen) days.   Vitamin D3 25 MCG (1000 UT) Caps Take by mouth.      No Known Allergies  Follow-up Information    [facility], [facility] Follow up.   Specialty: [place] Why: [place] information: [place] [address] [phone]                The results of significant diagnostics from this hospitalization (including imaging, microbiology, ancillary and laboratory) are listed below for reference.    Significant Diagnostic Studies: CT Angio Chest  PE W and/or Wo Contrast  Addendum Date: 03/01/2021   ADDENDUM REPORT: 03/01/2021 22:30 ADDENDUM: These results were called by telephone at the time of care team contact on 03/01/2021 at 10:30 pm to provider Sandford Craze RN, who verbally acknowledged these results and noted that the covering physician Dr. Milon Dikes was aware of the results. Electronically Signed   By: Lovena Le M.D.   On: 03/01/2021 22:30   Result Date: 03/01/2021 CLINICAL DATA:  Hypoxia, rule out COVID, PE EXAM: CT ANGIOGRAPHY CHEST WITH CONTRAST TECHNIQUE: Multidetector CT imaging of the chest was performed using the standard protocol during bolus administration of intravenous contrast. Multiplanar CT image reconstructions and MIPs were obtained to evaluate the vascular anatomy. CONTRAST:  74mL OMNIPAQUE IOHEXOL 350 MG/ML SOLN COMPARISON:  Radiograph 03/01/2021 FINDINGS: Cardiovascular: Satisfactory opacification of pulmonary arteries to the segmental level. Hypodense pulmonary artery filling  defects extend from the distal left main pulmonary artery into the lobar and segmental branches of the left upper and lower lobes and lingula. Filling defects extend from the distal right main pulmonary artery into the right upper, middle and lower lobar and segmental branches. Elevation of the RV-LV ratio to 1.67. Slight reflux of contrast into the IVC. No pericardial effusion. Coronary artery calcifications. Cardiac size remains within normal limits. Atherosclerotic plaque within the normal caliber aorta and proximal great vessels. No acute luminal abnormality of the imaged aorta. No periaortic stranding or hemorrhage. Normal 3 vessel branching of the aortic arch. Mediastinum/Nodes: No mediastinal fluid or gas. Normal thyroid gland and thoracic inlet. No acute abnormality of the trachea or esophagus. No worrisome mediastinal, hilar or axillary adenopathy. Lungs/Pleura: Scattered centrilobular ground-glass nodularity is noted throughout both lungs, there is a slight mid to lower lung predominance, some additional atelectatic changes are present as well. No pneumothorax. No effusion. No other concerning pulmonary nodules or masses. Upper Abdomen: No acute abnormalities present in the visualized portions of the upper abdomen. Cortical scarring in both kidneys. Upper abdominal atherosclerosis. Musculoskeletal: Multilevel degenerative changes are present in the imaged portions of the spine. No acute osseous abnormality or suspicious osseous lesion. No worrisome chest wall masses or lesions. Review of the MIP images confirms the above findings. IMPRESSION: 1. Extensive bilateral pulmonary emboli involving the distal left and right main pulmonary arteries into the lobar and segmental branches throughout both lungs. Positive for acute PE with CT evidence of right heart strain (RV/LV Ratio 1.67) consistent with at least submassive (intermediate risk) PE. The presence of right heart strain has been associated with an  increased risk of morbidity and mortality. 2. Scattered centrilobular ground-glass nodularity throughout both lungs, with a slight mid to lower lung predominance. Can be seen with an infectious or inflammatory bronchiolitis in the appropriate clinical setting including atypical infections. 3. Aortic Atherosclerosis (ICD10-I70.0). 4. Currently attempting to contact the ordering provider with a critical value result. Addendum will be submitted upon case discussion. Electronically Signed: By: Lovena Le M.D. On: 03/01/2021 22:01   DG Chest Portable 1 View  Result Date: 03/01/2021 CLINICAL DATA:  Altered mental status and shortness of breath. EXAM: PORTABLE CHEST 1 VIEW COMPARISON:  PA and lateral chest 10/15/2015. FINDINGS: The lungs are clear. Heart size is normal. No pneumothorax or pleural fluid. Aortic atherosclerosis noted. Loop recorder is identified. IMPRESSION: No acute disease. Aortic Atherosclerosis (ICD10-I70.0). Electronically Signed   By: Inge Rise M.D.   On: 03/01/2021 18:10   ECHOCARDIOGRAM COMPLETE BUBBLE STUDY  Result Date: 03/02/2021    ECHOCARDIOGRAM REPORT  Patient Name:   AILSA MIRELES Date of Exam: 03/02/2021 Medical Rec #:  [id number]       Height:       64.0 in Accession #:    [id number]      Weight:       141.1 lb Date of Birth:  1941/08/26        BSA:          1.687 m Patient Age:    55 years        BP:           103/69 mmHg Patient Gender: F               HR:           61 bpm. Exam Location:  Inpatient Procedure: 2D Echo, Cardiac Doppler and Color Doppler Indications:     PFO  History:         Patient has prior history of Echocardiogram examinations, most                  recent 10/17/2015. Pulmonary embolus.  Sonographer:     Clayton Lefort RDCS (AE) Referring Phys:  [place] Diagnosing Phys: Buford Dresser MD IMPRESSIONS  1. Left ventricular ejection fraction, by estimation, is 60 to 65%. The left ventricle has normal function. The left ventricle has no regional  wall motion abnormalities. There is moderate concentric left ventricular hypertrophy. Left ventricular diastolic parameters were normal.  2. Right ventricular systolic function is mildly reduced. The right ventricular size is mildly enlarged. There is normal pulmonary artery systolic pressure.  3. No clear color doppler flow seen across intraatrial septum, but limited images. Rapidly, vigorously positive bubble study (image 87) suggests significant right to left shunt present. Agitated saline contrast bubble study was positive with shunting observed within 3-6 cardiac cycles suggestive of interatrial shunt.  4. Right atrial size was mildly dilated.  5. The mitral valve is normal in structure. Mild mitral valve regurgitation. No evidence of mitral stenosis.  6. The aortic valve is tricuspid. Aortic valve regurgitation is not visualized. No aortic stenosis is present. Comparison(s): No significant change from prior study. 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 There is hypermobility of the septum, but color doppler images are limited for assessment of left to right shunt. Prior TEE in  2016 noted PFO with positive bubble study. Findings communicated to primary inpatient team. FINDINGS  Left Ventricle: Left ventricular ejection fraction, by estimation, is 60 to 65%. The left ventricle has normal function. The left ventricle has no regional wall motion abnormalities. The left ventricular internal cavity size was normal in size. There is  moderate concentric left ventricular hypertrophy. Left ventricular diastolic parameters were normal. Right Ventricle: The right ventricular size is mildly enlarged. Right vetricular wall thickness was not well visualized. Right ventricular systolic function is mildly reduced. There is normal pulmonary artery systolic pressure. The tricuspid regurgitant velocity is 1.91 m/s, and with an assumed right  atrial pressure of 8 mmHg, the estimated right ventricular systolic pressure is 72.0 mmHg. Left Atrium: Left atrial size was normal in size. Right Atrium: Right atrial size was mildly dilated. Pericardium: Trivial pericardial effusion is present. Presence of pericardial fat pad. Mitral Valve: The mitral valve is normal in structure. There is mild thickening of the mitral valve leaflet(s). Mild mitral valve regurgitation. No evidence of mitral valve stenosis. MV peak gradient, 2.0 mmHg. The mean mitral valve gradient  is 1.0 mmHg. Tricuspid Valve: The tricuspid valve is normal in structure. Tricuspid valve regurgitation is trivial. No evidence of tricuspid stenosis. Aortic Valve: The aortic valve is tricuspid. Aortic valve regurgitation is not visualized. No aortic stenosis is present. Aortic valve mean gradient measures 2.0 mmHg. Aortic valve peak gradient measures 3.9 mmHg. Aortic valve area, by VTI measures 2.10 cm. Pulmonic Valve: The pulmonic valve was not well visualized. Pulmonic valve regurgitation is mild. No evidence of pulmonic stenosis. Aorta: The aortic root, ascending aorta and aortic arch are all structurally normal, with no evidence of dilitation or obstruction. IAS/Shunts: The interatrial septum is aneurysmal. The interatrial septum was not well visualized. Agitated saline contrast was given intravenously to evaluate for intracardiac shunting. Agitated saline contrast bubble study was positive with shunting observed within 3-6 cardiac cycles suggestive of interatrial shunt.  LEFT VENTRICLE PLAX 2D LVIDd:         4.00 cm  Diastology LVIDs:         2.60 cm  LV e' medial:    6.20 cm/s LV PW:         1.60 cm  LV E/e' medial:  8.9 LV IVS:        1.40 cm  LV e' lateral:   9.46 cm/s LVOT diam:     1.80 cm  LV E/e' lateral: 5.8 LV SV:         50 LV SV Index:   30 LVOT Area:     2.54 cm  RIGHT VENTRICLE             IVC RV Basal diam:  3.00 cm     IVC diam: 0.60 cm RV S prime:     12.70 cm/s TAPSE (M-mode):  1.7 cm LEFT ATRIUM           Index       RIGHT ATRIUM           Index LA diam:      3.50 cm 2.07 cm/m  RA Area:     14.00 cm LA Vol (A2C): 33.6 ml 19.92 ml/m RA Volume:   34.20 ml  20.28 ml/m LA Vol (A4C): 27.8 ml 16.48 ml/m  AORTIC VALVE AV Area (Vmax):    2.37 cm AV Area (Vmean):   2.06 cm AV Area (VTI):     2.10 cm AV Vmax:           98.20 cm/s AV Vmean:          74.200 cm/s AV VTI:            0.237 m AV Peak Grad:      3.9 mmHg AV Mean Grad:      2.0 mmHg LVOT Vmax:         91.40 cm/s LVOT Vmean:        60.200 cm/s LVOT VTI:          0.196 m LVOT/AV VTI ratio: 0.83  AORTA Ao Root diam: 3.30 cm Ao Asc diam:  3.10 cm MITRAL VALVE               TRICUSPID VALVE MV Area (PHT): 5.13 cm    TR Peak grad:   14.6 mmHg MV Area VTI:   2.03 cm    TR Vmax:        191.00 cm/s MV Peak grad:  2.0 mmHg MV Mean grad:  1.0 mmHg    SHUNTS MV Vmax:       0.70 m/s  Systemic VTI:  0.20 m MV Vmean:      36.9 cm/s   Systemic Diam: 1.80 cm MV Decel Time: 148 msec MV E velocity: 55.30 cm/s MV A velocity: 63.80 cm/s MV E/A ratio:  0.87 Buford Dresser MD Electronically signed by Buford Dresser MD Signature Date/Time: 03/02/2021/3:11:36 PM    Final (Updated)    VAS [place] LOWER EXTREMITY VENOUS (DVT)  Result Date: 03/03/2021  Lower Venous DVT Study Indications: Pulmonary embolism.  Comparison Study: 10/17/2015 - negative Performing Technologist: Rogelia Rohrer  Examination Guidelines: A complete evaluation includes B-mode imaging, spectral Doppler, color Doppler, and power Doppler as needed of all accessible portions of each vessel. Bilateral testing is considered an integral part of a complete examination. Limited examinations for reoccurring indications may be performed as noted. The reflux portion of the exam is performed with the patient in reverse Trendelenburg.  +---------+---------------+---------+-----------+----------+--------------+ RIGHT    CompressibilityPhasicitySpontaneityPropertiesThrombus Aging  +---------+---------------+---------+-----------+----------+--------------+ CFV      Full           Yes      Yes                                 +---------+---------------+---------+-----------+----------+--------------+ SFJ      Full                                                        +---------+---------------+---------+-----------+----------+--------------+ FV Prox  Full           Yes      Yes                                 +---------+---------------+---------+-----------+----------+--------------+ FV Mid   Full           Yes      Yes                                 +---------+---------------+---------+-----------+----------+--------------+ FV DistalFull           Yes      Yes                                 +---------+---------------+---------+-----------+----------+--------------+ PFV      Full                                                        +---------+---------------+---------+-----------+----------+--------------+ POP      Full           Yes      Yes                                 +---------+---------------+---------+-----------+----------+--------------+ PTV      Full                                                        +---------+---------------+---------+-----------+----------+--------------+  PERO     Full                                                        +---------+---------------+---------+-----------+----------+--------------+   +---------+---------------+---------+-----------+----------+-----------------+ LEFT     CompressibilityPhasicitySpontaneityPropertiesThrombus Aging    +---------+---------------+---------+-----------+----------+-----------------+ CFV      Partial        Yes      Yes                  Age Indeterminate +---------+---------------+---------+-----------+----------+-----------------+ SFJ      Partial                                      Age Indeterminate  +---------+---------------+---------+-----------+----------+-----------------+ FV Prox  Partial        Yes      Yes                  Age Indeterminate +---------+---------------+---------+-----------+----------+-----------------+ FV Mid   Full           Yes      Yes                                    +---------+---------------+---------+-----------+----------+-----------------+ FV DistalFull           Yes      Yes                                    +---------+---------------+---------+-----------+----------+-----------------+ PFV      Full           Yes      Yes                                    +---------+---------------+---------+-----------+----------+-----------------+ POP      Full           Yes      Yes                                    +---------+---------------+---------+-----------+----------+-----------------+ PTV      Full                                                           +---------+---------------+---------+-----------+----------+-----------------+ PERO     Full                                                           +---------+---------------+---------+-----------+----------+-----------------+     Summary: BILATERAL: -No evidence of popliteal cyst, bilaterally. RIGHT: - There is no evidence of deep vein thrombosis in the lower extremity. - There is no evidence of superficial  venous thrombosis.  LEFT: - Findings consistent with age indeterminate non occlusive deep vein thrombosis involving the SF junction, left common femoral vein, and proximal femoral vein.  *See table(s) above for measurements and observations. Electronically signed by Ruta Hinds MD on 03/03/2021 at 9:18:20 PM.    Final     Microbiology: Recent Results (from the past 240 hour(s))  Resp Panel by RT-PCR (Flu A&B, Covid) Nasopharyngeal Swab     Status: None   Collection Time: 03/01/21  5:50 PM   Specimen: Nasopharyngeal Swab; Nasopharyngeal(NP) swabs in vial transport  medium  Result Value Ref Range Status   SARS Coronavirus 2 by RT PCR NEGATIVE NEGATIVE Final    Comment: (NOTE) SARS-CoV-2 target nucleic acids are NOT DETECTED.  The SARS-CoV-2 RNA is generally detectable in upper respiratory specimens during the acute phase of infection. The lowest concentration of SARS-CoV-2 viral copies this assay can detect is 138 copies/mL. A negative result does not preclude SARS-Cov-2 infection and should not be used as the sole basis for treatment or other patient management decisions. A negative result may occur with  improper specimen collection/handling, submission of specimen other than nasopharyngeal swab, presence of viral mutation(s) within the areas targeted by this assay, and inadequate number of viral copies(<138 copies/mL). A negative result must be combined with clinical observations, patient history, and epidemiological information. The expected result is Negative.  Fact Sheet for Patients:  EntrepreneurPulse.com.au  Fact Sheet for Healthcare Providers:  IncredibleEmployment.be  This test is no t yet approved or cleared by the [place] FDA and  has been authorized for detection and/or diagnosis of SARS-CoV-2 by FDA under an Emergency Use Authorization (EUA). This EUA will remain  in effect (meaning this test can be used) for the duration of the COVID-19 declaration under Section 564(b)(1) of the Act, 21 U.S.C.section 360bbb-3(b)(1), unless the authorization is terminated  or revoked sooner.       Influenza A by PCR NEGATIVE NEGATIVE Final   Influenza B by PCR NEGATIVE NEGATIVE Final    Comment: (NOTE) The Xpert Xpress SARS-CoV-2/FLU/RSV plus assay is intended as an aid in the diagnosis of influenza from Nasopharyngeal swab specimens and should not be used as a sole basis for treatment. Nasal washings and aspirates are unacceptable for Xpert Xpress SARS-CoV-2/FLU/RSV testing.  Fact Sheet for  Patients: EntrepreneurPulse.com.au  Fact Sheet for Healthcare Providers: IncredibleEmployment.be  This test is not yet approved or cleared by the [place] FDA and has been authorized for detection and/or diagnosis of SARS-CoV-2 by FDA under an Emergency Use Authorization (EUA). This EUA will remain in effect (meaning this test can be used) for the duration of the COVID-19 declaration under Section 564(b)(1) of the Act, 21 U.S.C. section 360bbb-3(b)(1), unless the authorization is terminated or revoked.  Performed at [facility], [place] [address], [place], [place] [address]   Blood culture (routine x 2)     Status: Abnormal   Collection Time: 03/01/21  6:00 PM   Specimen: BLOOD  Result Value Ref Range Status   Specimen Description BLOOD RIGHT ANTECUBITAL  Final   Special Requests   Final    BOTTLES DRAWN AEROBIC AND ANAEROBIC Blood Culture adequate volume   Culture  Setup Time   Final    GRAM POSITIVE COCCI AEROBIC BOTTLE ONLY CRITICAL RESULT CALLED TO, READ BACK BY AND VERIFIED WITH: C,PIERCE PHARMD @1400  03/02/21 EB    Culture (A)  Final    STAPHYLOCOCCUS HOMINIS THE SIGNIFICANCE OF ISOLATING THIS ORGANISM FROM A  SINGLE SET OF BLOOD CULTURES WHEN MULTIPLE SETS ARE DRAWN IS UNCERTAIN. PLEASE NOTIFY THE MICROBIOLOGY DEPARTMENT WITHIN ONE WEEK IF SPECIATION AND SENSITIVITIES ARE REQUIRED. Performed at [facility], [place] [address], [place], [place] [address]    Report Status 03/04/2021 FINAL  Final  Blood Culture ID Panel (Reflexed)     Status: Abnormal   Collection Time: 03/01/21  6:00 PM  Result Value Ref Range Status   Enterococcus faecalis NOT DETECTED NOT DETECTED Final   Enterococcus Faecium NOT DETECTED NOT DETECTED Final   Listeria monocytogenes NOT DETECTED NOT DETECTED Final   Staphylococcus species DETECTED (A) NOT DETECTED Final    Comment: CRITICAL RESULT CALLED TO, READ BACK BY AND VERIFIED WITH: C,PIERCE PHARMD  @1400  03/02/21 EB    Staphylococcus aureus (BCID) NOT DETECTED NOT DETECTED Final   Staphylococcus epidermidis NOT DETECTED NOT DETECTED Final   Staphylococcus lugdunensis NOT DETECTED NOT DETECTED Final   Streptococcus species NOT DETECTED NOT DETECTED Final   Streptococcus agalactiae NOT DETECTED NOT DETECTED Final   Streptococcus pneumoniae NOT DETECTED NOT DETECTED Final   Streptococcus pyogenes NOT DETECTED NOT DETECTED Final   A.calcoaceticus-baumannii NOT DETECTED NOT DETECTED Final   Bacteroides fragilis NOT DETECTED NOT DETECTED Final   Enterobacterales NOT DETECTED NOT DETECTED Final   Enterobacter cloacae complex NOT DETECTED NOT DETECTED Final   Escherichia coli NOT DETECTED NOT DETECTED Final   Klebsiella aerogenes NOT DETECTED NOT DETECTED Final   Klebsiella oxytoca NOT DETECTED NOT DETECTED Final   Klebsiella pneumoniae NOT DETECTED NOT DETECTED Final   Proteus species NOT DETECTED NOT DETECTED Final   Salmonella species NOT DETECTED NOT DETECTED Final   Serratia marcescens NOT DETECTED NOT DETECTED Final   Haemophilus influenzae NOT DETECTED NOT DETECTED Final   Neisseria meningitidis NOT DETECTED NOT DETECTED Final   Pseudomonas aeruginosa NOT DETECTED NOT DETECTED Final   Stenotrophomonas maltophilia NOT DETECTED NOT DETECTED Final   Candida albicans NOT DETECTED NOT DETECTED Final   Candida auris NOT DETECTED NOT DETECTED Final   Candida glabrata NOT DETECTED NOT DETECTED Final   Candida krusei NOT DETECTED NOT DETECTED Final   Candida parapsilosis NOT DETECTED NOT DETECTED Final   Candida tropicalis NOT DETECTED NOT DETECTED Final   Cryptococcus neoformans/gattii NOT DETECTED NOT DETECTED Final    Comment: Performed at [facility], [place] [address], [place], [place] [address]  MRSA PCR Screening     Status: None   Collection Time: 03/01/21 10:03 PM   Specimen: Nasal Mucosa; Nasopharyngeal  Result Value Ref Range Status   MRSA by PCR NEGATIVE NEGATIVE Final     Comment:        The GeneXpert MRSA Assay (FDA approved for NASAL specimens only), is one component of a comprehensive MRSA colonization surveillance program. It is not intended to diagnose MRSA infection nor to guide or monitor treatment for MRSA infections. Performed at [facility], [place] [address], [place], [place] [address]   Blood culture (routine x 2)     Status: None (Preliminary result)   Collection Time: 03/01/21 10:53 PM   Specimen: BLOOD  Result Value Ref Range Status   Specimen Description BLOOD SITE NOT SPECIFIED  Final   Special Requests   Final    BOTTLES DRAWN AEROBIC ONLY Blood Culture results may not be optimal due to an inadequate volume of blood received in culture bottles   Culture   Final    NO GROWTH 4 DAYS Performed at [facility]  Lab, [address] [place], [place] [address]    Report Status PENDING  Incomplete     Labs: Basic Metabolic Panel: Recent Labs  Lab 03/01/21 1753 03/01/21 1802 03/02/21 0130 03/02/21 0443 03/03/21 0139 03/05/21 0043  NA 137 138 135 136 137 139  K 3.7 3.7 4.4 4.5 4.6 3.9  CL 105 103 102  --  106 107  CO2 20*  --  21*  --  20* 22  GLUCOSE 167* 163* 143*  --  92 83  BUN 17 19 18   --  24* 11  CREATININE 1.33* 1.30* 1.25*  --  1.48* 1.14*  CALCIUM 9.2  --  8.9  --  8.9 8.5*  MG  --   --  1.8  --   --   --    Liver Function Tests: Recent Labs  Lab 03/01/21 1753 03/02/21 0130  AST 17 28  ALT 16 15  ALKPHOS 67 57  BILITOT 0.7 0.8  PROT 6.7 5.6*  ALBUMIN 3.6 3.1*   No results for input(s): LIPASE, AMYLASE in the last 168 hours. No results for input(s): AMMONIA in the last 168 hours. CBC: Recent Labs  Lab 03/01/21 2147 03/02/21 0130 03/02/21 0443 03/03/21 0139 03/04/21 0109 03/05/21 0043  WBC 10.1 10.2  --  8.7 6.8 5.8  NEUTROABS  --  9.2*  --   --   --   --   HGB 11.6* 12.0 10.5* 10.5* 9.0* 8.9*  HCT 33.8* 36.4 31.0* 31.2* 27.1* 26.3*  MCV 89.4 90.3  --  91.5 92.8 91.3  PLT 186 203  --   182 197 183   Cardiac Enzymes: No results for input(s): CKTOTAL, CKMB, CKMBINDEX, TROPONINI in the last 168 hours. BNP: BNP (last 3 results) No results for input(s): BNP in the last 8760 hours.  ProBNP (last 3 results) No results for input(s): PROBNP in the last 8760 hours.  CBG: Recent Labs  Lab 03/01/21 2137 03/02/21 0410 03/02/21 0715 03/03/21 0719  GLUCAP 128* 153* 128* 82       Signed:  Nita Sells MD   [facility] 03/06/2021, 5:50 PM

## 2021-03-06 NOTE — Progress Notes (Signed)
Physical Therapy Treatment Patient Details Name: Nicole Ruiz MRN: [id number] DOB: 08/24/1941 Today's Date: 03/06/2021    History of Present Illness Pt is a 80 y/o female who was admitted 03/01/2021 with acute onset SOB and syncopal episode. She was found to have bilateral PE's with R heart strain s/p TPA. Of note pt was also found to have an age-indeterminate L DVT, AKI. PMH significant for  CVA, HTN, CKD, R femur IM nail 2016.    PT Comments    Pt was alert and participative, slower to progress toward goals and will likely d/c needing minimal assist from her husband for a short time.  Emphasis on transitions, sit to stand and progressing gait in the RW.    Follow Up Recommendations  Home health PT;Supervision for mobility/OOB     Equipment Recommendations  None recommended by PT    Recommendations for Other Services       Precautions / Restrictions Precautions Precautions: Fall    Mobility  Bed Mobility Overal bed mobility: Needs Assistance Bed Mobility: Supine to Sit     Supine to sit: Supervision     General bed mobility comments: no use of rails, no assist    Transfers Overall transfer level: Needs assistance Equipment used: Rolling walker (2 wheeled) Transfers: Sit to/from Stand Sit to Stand: Min assist         General transfer comment: assist forward and minimal boost.  Ambulation/Gait Ambulation/Gait assistance: Min assist Gait Distance (Feet): 60 Feet Assistive device: Rolling walker (2 wheeled) Gait Pattern/deviations: Step-through pattern   Gait velocity interpretation: <1.8 ft/sec, indicate of risk for recurrent falls General Gait Details: weak, tremulous knees throughout, but no overt sagging/buckling, heavy use of the RW, needed maneuvering assist during turns.   Stairs             Wheelchair Mobility    Modified Rankin (Stroke Patients Only)       Balance Overall balance assessment: Needs assistance Sitting-balance support:  No upper extremity supported;Single extremity supported Sitting balance-Leahy Scale: Fair     Standing balance support: Single extremity supported;Bilateral upper extremity supported;During functional activity Standing balance-Leahy Scale: Poor Standing balance comment: reliant on the AD or external support                            Cognition Arousal/Alertness: Awake/alert Behavior During Therapy: WFL for tasks assessed/performed Overall Cognitive Status: Within Functional Limits for tasks assessed                                        Exercises      General Comments General comments (skin integrity, edema, etc.): vss overall      Pertinent Vitals/Pain Pain Assessment: Faces Faces Pain Scale: No hurt Pain Intervention(s): Monitored during session    Home Living                      Prior Function            PT Goals (current goals can now be found in the care plan section) Acute Rehab PT Goals Patient Stated Goal: look forward to getting home PT Goal Formulation: With patient Time For Goal Achievement: 03/19/21 Potential to Achieve Goals: Good Progress towards PT goals: Progressing toward goals    Frequency    Min 3X/week      PT  Plan Current plan remains appropriate    Co-evaluation              AM-PAC PT "6 Clicks" Mobility   Outcome Measure  Help needed turning from your back to your side while in a flat bed without using bedrails?: A Little Help needed moving from lying on your back to sitting on the side of a flat bed without using bedrails?: A Little Help needed moving to and from a bed to a chair (including a wheelchair)?: A Little Help needed standing up from a chair using your arms (e.g., wheelchair or bedside chair)?: A Little Help needed to walk in hospital room?: A Little Help needed climbing 3-5 steps with a railing? : A Lot 6 Click Score: 17    End of Session   Activity Tolerance: Patient  limited by fatigue;Patient tolerated treatment well Patient left: in chair;with call bell/phone within reach;with chair alarm set;with family/visitor present Nurse Communication: Mobility status PT Visit Diagnosis: Unsteadiness on feet (R26.81);Other abnormalities of gait and mobility (R26.89);Difficulty in walking, not elsewhere classified (R26.2)     Time: 6568-1275 PT Time Calculation (min) (ACUTE ONLY): 22 min  Charges:  $Gait Training: 8-22 mins                     03/06/2021  Ginger Carne., PT Acute Rehabilitation Services [phone]  (pager) [phone]  (office)   Tessie Fass [place] 03/06/2021, 5:09 PM

## 2021-03-06 NOTE — Plan of Care (Signed)

## 2021-03-06 NOTE — TOC Transition Note (Signed)
Transition of Care [facility]) - CM/SW Discharge Note   Patient Details  Name: Nicole Ruiz MRN: [id number] Date of Birth: [birth year]  [facility]) CM/SW Contact:  Zenon Mayo, RN Phone Number: 03/06/2021, 12:06 PM   Clinical Narrative:    Patient is for dc today, NCM notified Tommi Rumps with [employer].  She is also aware of her copay for eliquis.   Final next level of care: [place] Barriers to Discharge: No Barriers Identified   Patient Goals and CMS Choice Patient states their goals for this hospitalization and ongoing recovery are:: home with [facility] CMS Medicare.gov Compare Post Acute Care list provided to:: Patient Choice offered to / list presented to : Patient  Discharge Placement                       Discharge Plan and Services                  DME Agency: NA       HH Arranged: RN,PT [place] Agency: [facility] Date [place]: 03/05/21 Time [place]: 1700 Representative spoke with at [place]: [place] ([place]) Interventions     Readmission Risk Interventions No flowsheet data found.

## 2021-03-07 DIAGNOSIS — K219 Gastro-esophageal reflux disease without esophagitis: Secondary | ICD-10-CM

## 2021-03-07 LAB — CULTURE, BLOOD (ROUTINE X 2): Culture: NO GROWTH

## 2021-03-07 NOTE — Progress Notes (Signed)
Patient discharge instructions reviewed with spouse at the bedside.  Verbalized understanding of medication instructions and follow up.  Patient via wheelchair to waiting vehicle.

## 2021-03-07 NOTE — Discharge Summary (Signed)
Physician Discharge Summary  Nicole Ruiz NLZ:[id number] DOB: 08/04/1941 DOA: 03/01/2021  PCP: Bernerd Limbo, MD  Admit date: 03/01/2021 Discharge date: 03/07/2021  Time spent: 37 minutes  Recommendations for Outpatient Follow-up:  1. Requires outpatient Chem-7 CBC in 1 week 2. Recommend outpatient goals of care given moderate dementia 3. Para medicine engaged to get physical therapy involved patient will also need nursing case management at home to help with medication management as she gets her meds to herself and has underlying mild dementia 4. Err on the side of Eliquis probably 3 months given dementia and noncon commitment aspirin for prior stroke 5. Needs TEE 6 to 12 weeks with cardiologist if in keeping with patient's personal wishes 6. 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  Unspecified GU malignancy??  Ad[facility] to [facility]/01/[facility]21 from home respiratory distress, toxic metabolic encephalopathy, O2 sat 50% requiring NRB 15 L/M-placed on BiPAP-mottled cyanotic Admitted by critical care-etiology submassive PE Status post TPA 3/5 transitioned to an/C nasal cannula subsequently Subsequently transferred to hospitalist service 3/8  Hospital Course:  Age indeterminant submassive PE status post TPA on admission Syncope secondary to above On admit acute PE RV/LV 1.67-->TPA  given--started heparin GTT--lower extremity duplex negative right side, left side nonocclusive age-indeterminate DVT Eliquis at least for 3 to 6 months  Nausea mild abdominal discomfort Did not eat much today and apparently does not eat much in general Speech therapy saw her and cleared her for diet She is not having any other symptoms at this time  Possible cystitis UA performed positive leukocytes however no culture done unfortunately Cover with Keflex for 3 days in the outpatient can complete  Sinus bradycardia On propranolol for tremors which was adjusted downwards to a lower dose  PFO 2016+ R-->L shunt TTE 36 = right ventricular enlargement-needs TEE in 6 to 12 weeks with cardiologist  Normocytic anemia Normocytic anemia  Depression anxiety tremors ?  Vascular dementia Continue Namenda, donepezil, primidone, Prozac Try to minimize Wellbutrin and Xanax  Polypharmacy Holding tramadol at this time holding meclizine at this time  Hypothyroid Continue Synthroid 50   Procedures:  CT chest  Echo  Consultations:  Assumed care from critical care  Discharge Exam: Vitals:   03/07/21 0614 03/07/21 0715  BP: (!) 164/73 140/71  Pulse: (!) 59 63  Resp: 14 16  Temp: 98.1 F (36.7 C) 98 F (36.7 C)  SpO2: 93% 93%    Subj on day of d/c She is alert and mildly confused. She has no new complaints. She is on Propranolol 10 mg BID for her essential tremors.  Primidone has been discontinued due to its interaction with Eliquis.  General Exam on discharge  EOMI NCAT no CTA B No added sound no rales no rhonchi Abdomen soft nontender no rebound no guarding No lower extremity edema Psychiatric euthymic pleasant mildly incoherent at times No rash  Discharge Instructions   Discharge Instructions    Diet - low sodium heart healthy   Complete by: As directed    Discharge instructions   Complete by: As directed    Finish the antibiotics completely You will  notice that u will be on a blood thinnner at 2 different doses--hopefully u can use this blood thinner for at least 3-6 months Make sure that if u have bleeding or dark stool that you report it You should resume your aspirin   Increase activity slowly   Complete by: As directed      Allergies as of 03/07/2021   No Known Allergies     Medication List    STOP taking these medications   Acetaminophen-Codeine 300-15 MG Tabs   Calcium Carb-Ergocalciferol 500-200 MG-UNIT Tabs   meclizine 12.5 MG tablet Commonly known as: ANTIVERT   primidone 50 MG tablet Commonly known as: MYSOLINE   traMADol 50 MG tablet Commonly known as: ULTRAM     TAKE these medications   ALPRAZolam 0.5 MG 24 hr tablet Commonly known as: Xanax XR Take 1 tablet (0.5 mg total) by mouth daily.   apixaban 5 MG Tabs tablet Commonly known as: ELIQUIS Take 2 tablets (10 mg total) by mouth 2 (two) times daily for 5 days.   apixaban 5 MG Tabs tablet Commonly known as: ELIQUIS Take 1 tablet (5 mg total) by mouth 2 (two) times daily. Start taking on: March 11, 2021   aspirin EC 81 MG tablet Take by mouth.   atorvastatin 20 MG tablet Commonly known as: LIPITOR Take 20 mg by mouth daily.   buPROPion 150 MG 24 hr tablet Commonly known as: WELLBUTRIN XL Take by mouth.   cephALEXin 500 MG capsule Commonly known as: KEFLEX Take 1 capsule (500 mg total) by mouth every 12 (twelve) hours.   donepezil 10 MG tablet Commonly known as: ARICEPT Take 10 mg by mouth daily.   Fish Oil 1000 MG Caps Take by mouth.   FLUoxetine 40 MG capsule Commonly known as: PROZAC Take 1 capsule (40 mg total) by mouth daily.   levothyroxine 50 MCG tablet Commonly known as: SYNTHROID   memantine 10 MG tablet Commonly known as: NAMENDA Take 10 mg by mouth 2 (two) times daily.   omeprazole 20 MG tablet Commonly known as: PRILOSEC OTC Take 20 mg by mouth daily.   polyvinyl alcohol 1.4 % ophthalmic solution Commonly known  as: LIQUIFILM TEARS Place 1 drop into both eyes every hour as needed for dry eyes.   propranolol 10 MG tablet Commonly known as: INDERAL Take 1 tablet (10 mg total) by mouth 2 (two) times daily. What changed:   medication strength  how much to take   UNABLE TO FIND Mega red take one daily   VITAMIN B 12 PO Take 1,000 mg by mouth.   Vitamin D (Ergocalciferol) 1.25 MG (50000 UNIT) Caps capsule Commonly known as: DRISDOL Take 50,000 Units by mouth every 14 (fourteen) days.   Vitamin D3 25 MCG (1000 UT) Caps Take by mouth.      No Known Allergies  Follow-up Information    [facility] Follow up.   Specialty: Home Health Services Why: [place] information: [place] [address] [phone]        Minus Breeding, MD. Call in 1 day(s).   Specialty: Cardiology Why: Please call for a post hospital follow-up appointment. Contact information: [place] [address] [place] [address] [phone]  Bernerd Limbo, MD. Call in 1 day(s).   Specialty: Family Medicine Why: Please call for a post hospital follow-up appointment. Contact information: [place] [address] [phone]                The results of significant diagnostics from this hospitalization (including imaging, microbiology, ancillary and laboratory) are listed below for reference.    Significant Diagnostic Studies: CT Angio Chest PE W and/or Wo Contrast  Addendum Date: 03/01/2021   ADDENDUM REPORT: 03/01/2021 22:30 ADDENDUM: These results were called by telephone at the time of care team contact on 03/01/2021 at 10:30 pm to provider Sandford Craze RN, who verbally acknowledged these results and noted that the covering physician Dr. Milon Dikes was aware of the results. Electronically Signed   By: Lovena Le M.D.   On: 03/01/2021 22:30   Result Date: 03/01/2021 CLINICAL DATA:  Hypoxia, rule  out COVID, PE EXAM: CT ANGIOGRAPHY CHEST WITH CONTRAST TECHNIQUE: Multidetector CT imaging of the chest was performed using the standard protocol during bolus administration of intravenous contrast. Multiplanar CT image reconstructions and MIPs were obtained to evaluate the vascular anatomy. CONTRAST:  76mL OMNIPAQUE IOHEXOL 350 MG/ML SOLN COMPARISON:  Radiograph 03/01/2021 FINDINGS: Cardiovascular: Satisfactory opacification of pulmonary arteries to the segmental level. Hypodense pulmonary artery filling defects extend from the distal left main pulmonary artery into the lobar and segmental branches of the left upper and lower lobes and lingula. Filling defects extend from the distal right main pulmonary artery into the right upper, middle and lower lobar and segmental branches. Elevation of the RV-LV ratio to 1.67. Slight reflux of contrast into the IVC. No pericardial effusion. Coronary artery calcifications. Cardiac size remains within normal limits. Atherosclerotic plaque within the normal caliber aorta and proximal great vessels. No acute luminal abnormality of the imaged aorta. No periaortic stranding or hemorrhage. Normal 3 vessel branching of the aortic arch. Mediastinum/Nodes: No mediastinal fluid or gas. Normal thyroid gland and thoracic inlet. No acute abnormality of the trachea or esophagus. No worrisome mediastinal, hilar or axillary adenopathy. Lungs/Pleura: Scattered centrilobular ground-glass nodularity is noted throughout both lungs, there is a slight mid to lower lung predominance, some additional atelectatic changes are present as well. No pneumothorax. No effusion. No other concerning pulmonary nodules or masses. Upper Abdomen: No acute abnormalities present in the visualized portions of the upper abdomen. Cortical scarring in both kidneys. Upper abdominal atherosclerosis. Musculoskeletal: Multilevel degenerative changes are present in the imaged portions of the spine. No acute osseous  abnormality or suspicious osseous lesion. No worrisome chest wall masses or lesions. Review of the MIP images confirms the above findings. IMPRESSION: 1. Extensive bilateral pulmonary emboli involving the distal left and right main pulmonary arteries into the lobar and segmental branches throughout both lungs. Positive for acute PE with CT evidence of right heart strain (RV/LV Ratio 1.67) consistent with at least submassive (intermediate risk) PE. The presence of right heart strain has been associated with an increased risk of morbidity and mortality. 2. Scattered centrilobular ground-glass nodularity throughout both lungs, with a slight mid to lower lung predominance. Can be seen with an infectious or inflammatory bronchiolitis in the appropriate clinical setting including atypical infections. 3. Aortic Atherosclerosis (ICD10-I70.0). 4. Currently attempting to contact the ordering provider with a critical value result. Addendum will be submitted upon case discussion. Electronically Signed: By: Lovena Le M.D. On: 03/01/2021 22:01   DG Chest Portable 1 View  Result Date: 03/01/2021 CLINICAL  DATA:  Altered mental status and shortness of breath. EXAM: PORTABLE CHEST 1 VIEW COMPARISON:  PA and lateral chest 10/15/2015. FINDINGS: The lungs are clear. Heart size is normal. No pneumothorax or pleural fluid. Aortic atherosclerosis noted. Loop recorder is identified. IMPRESSION: No acute disease. Aortic Atherosclerosis (ICD10-I70.0). Electronically Signed   By: Inge Rise M.D.   On: 03/01/2021 18:10   ECHOCARDIOGRAM COMPLETE BUBBLE STUDY  Result Date: 03/02/2021    ECHOCARDIOGRAM REPORT   Patient Name:   Nicole Ruiz Date of Exam: 03/02/2021 Medical Rec #:  [id number]       Height:       64.0 in Accession #:    [id number]      Weight:       141.1 lb Date of Birth:  [birth year]        BSA:          1.687 m Patient Age:    58 years        BP:           103/69 mmHg Patient Gender: F               HR:           61  bpm. Exam Location:  Inpatient Procedure: 2D Echo, Cardiac Doppler and Color Doppler Indications:     PFO  History:         Patient has prior history of Echocardiogram examinations, most                  recent 10/17/2015. Pulmonary embolus.  Sonographer:     Clayton Lefort RDCS (AE) Referring Phys:  [place] Diagnosing Phys: Buford Dresser MD IMPRESSIONS  1. Left ventricular ejection fraction, by estimation, is 60 to 65%. The left ventricle has normal function. The left ventricle has no regional wall motion abnormalities. There is moderate concentric left ventricular hypertrophy. Left ventricular diastolic parameters were normal.  2. Right ventricular systolic function is mildly reduced. The right ventricular size is mildly enlarged. There is normal pulmonary artery systolic pressure.  3. No clear color doppler flow seen across intraatrial septum, but limited images. Rapidly, vigorously positive bubble study (image 87) suggests significant right to left shunt present. Agitated saline contrast bubble study was positive with shunting observed within 3-6 cardiac cycles suggestive of interatrial shunt.  4. Right atrial size was mildly dilated.  5. The mitral valve is normal in structure. Mild mitral valve regurgitation. No evidence of mitral stenosis.  6. The aortic valve is tricuspid. Aortic valve regurgitation is not visualized. No aortic stenosis is present. Comparison(s): No significant change from prior study. 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 There is hypermobility of the septum, but color doppler images are limited for assessment of left to right shunt. Prior TEE in  2016 noted PFO with positive bubble study. Findings communicated to primary inpatient team. FINDINGS  Left Ventricle: Left ventricular ejection fraction, by estimation, is 60 to 65%. The left ventricle has normal function. The left ventricle  has no regional wall motion abnormalities. The left ventricular internal cavity size was normal in size. There is  moderate concentric left ventricular hypertrophy. Left ventricular diastolic parameters were normal. Right Ventricle: The right ventricular size is mildly enlarged. Right vetricular wall thickness was not well visualized. Right ventricular systolic function is mildly reduced. There is normal pulmonary artery systolic pressure. The tricuspid regurgitant velocity is 1.91 m/s, and with an assumed right  atrial pressure of 8 mmHg, the estimated right ventricular systolic pressure is 72.6 mmHg. Left Atrium: Left atrial size was normal in size. Right Atrium: Right atrial size was mildly dilated. Pericardium: Trivial pericardial effusion is present. Presence of pericardial fat pad. Mitral Valve: The mitral valve is normal in structure. There is mild thickening of the mitral valve leaflet(s). Mild mitral valve regurgitation. No evidence of mitral valve stenosis. MV peak gradient, 2.0 mmHg. The mean mitral valve gradient is 1.0 mmHg. Tricuspid Valve: The tricuspid valve is normal in structure. Tricuspid valve regurgitation is trivial. No evidence of tricuspid stenosis. Aortic Valve: The aortic valve is tricuspid. Aortic valve regurgitation is not visualized. No aortic stenosis is present. Aortic valve mean gradient measures 2.0 mmHg. Aortic valve peak gradient measures 3.9 mmHg. Aortic valve area, by VTI measures 2.10 cm. Pulmonic Valve: The pulmonic valve was not well visualized. Pulmonic valve regurgitation is mild. No evidence of pulmonic stenosis. Aorta: The aortic root, ascending aorta and aortic arch are all structurally normal, with no evidence of dilitation or obstruction. IAS/Shunts: The interatrial septum is aneurysmal. The interatrial septum was not well visualized. Agitated saline contrast was given intravenously to evaluate for intracardiac shunting. Agitated saline contrast bubble study was  positive with shunting observed within 3-6 cardiac cycles suggestive of interatrial shunt.  LEFT VENTRICLE PLAX 2D LVIDd:         4.00 cm  Diastology LVIDs:         2.60 cm  LV e' medial:    6.20 cm/s LV PW:         1.60 cm  LV E/e' medial:  8.9 LV IVS:        1.40 cm  LV e' lateral:   9.46 cm/s LVOT diam:     1.80 cm  LV E/e' lateral: 5.8 LV SV:         50 LV SV Index:   30 LVOT Area:     2.54 cm  RIGHT VENTRICLE             IVC RV Basal diam:  3.00 cm     IVC diam: 0.60 cm RV S prime:     12.70 cm/s TAPSE (M-mode): 1.7 cm LEFT ATRIUM           Index       RIGHT ATRIUM           Index LA diam:      3.50 cm 2.07 cm/m  RA Area:     14.00 cm LA Vol (A2C): 33.6 ml 19.92 ml/m RA Volume:   34.20 ml  20.28 ml/m LA Vol (A4C): 27.8 ml 16.48 ml/m  AORTIC VALVE AV Area (Vmax):    2.37 cm AV Area (Vmean):   2.06 cm AV Area (VTI):     2.10 cm AV Vmax:           98.20 cm/s AV Vmean:          74.200 cm/s AV VTI:            0.237 m AV Peak Grad:      3.9 mmHg AV Mean Grad:      2.0 mmHg LVOT Vmax:         91.40 cm/s LVOT Vmean:        60.200 cm/s LVOT VTI:          0.196 m LVOT/AV VTI ratio: 0.83  AORTA Ao Root diam: 3.30 cm Ao Asc diam:  3.10 cm MITRAL VALVE  TRICUSPID VALVE MV Area (PHT): 5.13 cm    TR Peak grad:   14.6 mmHg MV Area VTI:   2.03 cm    TR Vmax:        191.00 cm/s MV Peak grad:  2.0 mmHg MV Mean grad:  1.0 mmHg    SHUNTS MV Vmax:       0.70 m/s    Systemic VTI:  0.20 m MV Vmean:      36.9 cm/s   Systemic Diam: 1.80 cm MV Decel Time: 148 msec MV E velocity: 55.30 cm/s MV A velocity: 63.80 cm/s MV E/A ratio:  0.87 Buford Dresser MD Electronically signed by Buford Dresser MD Signature Date/Time: 03/02/2021/3:11:36 PM    Final (Updated)    VAS [place] LOWER EXTREMITY VENOUS (DVT)  Result Date: 03/03/2021  Lower Venous DVT Study Indications: Pulmonary embolism.  Comparison Study: 10/17/2015 - negative Performing Technologist: Rogelia Rohrer  Examination Guidelines: A complete evaluation  includes B-mode imaging, spectral Doppler, color Doppler, and power Doppler as needed of all accessible portions of each vessel. Bilateral testing is considered an integral part of a complete examination. Limited examinations for reoccurring indications may be performed as noted. The reflux portion of the exam is performed with the patient in reverse Trendelenburg.  +---------+---------------+---------+-----------+----------+--------------+ RIGHT    CompressibilityPhasicitySpontaneityPropertiesThrombus Aging +---------+---------------+---------+-----------+----------+--------------+ CFV      Full           Yes      Yes                                 +---------+---------------+---------+-----------+----------+--------------+ SFJ      Full                                                        +---------+---------------+---------+-----------+----------+--------------+ FV Prox  Full           Yes      Yes                                 +---------+---------------+---------+-----------+----------+--------------+ FV Mid   Full           Yes      Yes                                 +---------+---------------+---------+-----------+----------+--------------+ FV DistalFull           Yes      Yes                                 +---------+---------------+---------+-----------+----------+--------------+ PFV      Full                                                        +---------+---------------+---------+-----------+----------+--------------+ POP      Full           Yes      Yes                                 +---------+---------------+---------+-----------+----------+--------------+  PTV      Full                                                        +---------+---------------+---------+-----------+----------+--------------+ PERO     Full                                                         +---------+---------------+---------+-----------+----------+--------------+   +---------+---------------+---------+-----------+----------+-----------------+ LEFT     CompressibilityPhasicitySpontaneityPropertiesThrombus Aging    +---------+---------------+---------+-----------+----------+-----------------+ CFV      Partial        Yes      Yes                  Age Indeterminate +---------+---------------+---------+-----------+----------+-----------------+ SFJ      Partial                                      Age Indeterminate +---------+---------------+---------+-----------+----------+-----------------+ FV Prox  Partial        Yes      Yes                  Age Indeterminate +---------+---------------+---------+-----------+----------+-----------------+ FV Mid   Full           Yes      Yes                                    +---------+---------------+---------+-----------+----------+-----------------+ FV DistalFull           Yes      Yes                                    +---------+---------------+---------+-----------+----------+-----------------+ PFV      Full           Yes      Yes                                    +---------+---------------+---------+-----------+----------+-----------------+ POP      Full           Yes      Yes                                    +---------+---------------+---------+-----------+----------+-----------------+ PTV      Full                                                           +---------+---------------+---------+-----------+----------+-----------------+ PERO     Full                                                           +---------+---------------+---------+-----------+----------+-----------------+  Summary: BILATERAL: -No evidence of popliteal cyst, bilaterally. RIGHT: - There is no evidence of deep vein thrombosis in the lower extremity. - There is no evidence of superficial venous thrombosis.  LEFT: -  Findings consistent with age indeterminate non occlusive deep vein thrombosis involving the SF junction, left common femoral vein, and proximal femoral vein.  *See table(s) above for measurements and observations. Electronically signed by Ruta Hinds MD on 03/03/2021 at 9:18:20 PM.    Final     Microbiology: Recent Results (from the past 240 hour(s))  Resp Panel by RT-PCR (Flu A&B, Covid) Nasopharyngeal Swab     Status: None   Collection Time: 03/01/21  5:50 PM   Specimen: Nasopharyngeal Swab; Nasopharyngeal(NP) swabs in vial transport medium  Result Value Ref Range Status   SARS Coronavirus 2 by RT PCR NEGATIVE NEGATIVE Final    Comment: (NOTE) SARS-CoV-2 target nucleic acids are NOT DETECTED.  The SARS-CoV-2 RNA is generally detectable in upper respiratory specimens during the acute phase of infection. The lowest concentration of SARS-CoV-2 viral copies this assay can detect is 138 copies/mL. A negative result does not preclude SARS-Cov-2 infection and should not be used as the sole basis for treatment or other patient management decisions. A negative result may occur with  improper specimen collection/handling, submission of specimen other than nasopharyngeal swab, presence of viral mutation(s) within the areas targeted by this assay, and inadequate number of viral copies(<138 copies/mL). A negative result must be combined with clinical observations, patient history, and epidemiological information. The expected result is Negative.  Fact Sheet for Patients:  EntrepreneurPulse.com.au  Fact Sheet for Healthcare Providers:  IncredibleEmployment.be  This test is no t yet approved or cleared by the [place] FDA and  has been authorized for detection and/or diagnosis of SARS-CoV-2 by FDA under an Emergency Use Authorization (EUA). This EUA will remain  in effect (meaning this test can be used) for the duration of the COVID-19 declaration under  Section 564(b)(1) of the Act, 21 U.S.C.section 360bbb-3(b)(1), unless the authorization is terminated  or revoked sooner.       Influenza A by PCR NEGATIVE NEGATIVE Final   Influenza B by PCR NEGATIVE NEGATIVE Final    Comment: (NOTE) The Xpert Xpress SARS-CoV-2/FLU/RSV plus assay is intended as an aid in the diagnosis of influenza from Nasopharyngeal swab specimens and should not be used as a sole basis for treatment. Nasal washings and aspirates are unacceptable for Xpert Xpress SARS-CoV-2/FLU/RSV testing.  Fact Sheet for Patients: EntrepreneurPulse.com.au  Fact Sheet for Healthcare Providers: IncredibleEmployment.be  This test is not yet approved or cleared by the [place] FDA and has been authorized for detection and/or diagnosis of SARS-CoV-2 by FDA under an Emergency Use Authorization (EUA). This EUA will remain in effect (meaning this test can be used) for the duration of the COVID-19 declaration under Section 564(b)(1) of the Act, 21 U.S.C. section 360bbb-3(b)(1), unless the authorization is terminated or revoked.  Performed at [facility], [place] [address], [place], [place] [address]   Blood culture (routine x 2)     Status: Abnormal   Collection Time: 03/01/21  6:00 PM   Specimen: BLOOD  Result Value Ref Range Status   Specimen Description BLOOD RIGHT ANTECUBITAL  Final   Special Requests   Final    BOTTLES DRAWN AEROBIC AND ANAEROBIC Blood Culture adequate volume   Culture  Setup Time   Final    GRAM POSITIVE COCCI AEROBIC BOTTLE ONLY CRITICAL RESULT CALLED TO, READ BACK  BY AND VERIFIED WITH: C,PIERCE PHARMD @1400  03/02/21 EB    Culture (A)  Final    STAPHYLOCOCCUS HOMINIS THE SIGNIFICANCE OF ISOLATING THIS ORGANISM FROM A SINGLE SET OF BLOOD CULTURES WHEN MULTIPLE SETS ARE DRAWN IS UNCERTAIN. PLEASE NOTIFY THE MICROBIOLOGY DEPARTMENT WITHIN ONE WEEK IF SPECIATION AND SENSITIVITIES ARE REQUIRED. Performed at [facility], [place] [address] [place], [place] [address]    Report Status 03/04/2021 FINAL  Final  Blood Culture ID Panel (Reflexed)     Status: Abnormal   Collection Time: 03/01/21  6:00 PM  Result Value Ref Range Status   Enterococcus faecalis NOT DETECTED NOT DETECTED Final   Enterococcus Faecium NOT DETECTED NOT DETECTED Final   Listeria monocytogenes NOT DETECTED NOT DETECTED Final   Staphylococcus species DETECTED (A) NOT DETECTED Final    Comment: CRITICAL RESULT CALLED TO, READ BACK BY AND VERIFIED WITH: C,PIERCE PHARMD @1400  03/02/21 EB    Staphylococcus aureus (BCID) NOT DETECTED NOT DETECTED Final   Staphylococcus epidermidis NOT DETECTED NOT DETECTED Final   Staphylococcus lugdunensis NOT DETECTED NOT DETECTED Final   Streptococcus species NOT DETECTED NOT DETECTED Final   Streptococcus agalactiae NOT DETECTED NOT DETECTED Final   Streptococcus pneumoniae NOT DETECTED NOT DETECTED Final   Streptococcus pyogenes NOT DETECTED NOT DETECTED Final   A.calcoaceticus-baumannii NOT DETECTED NOT DETECTED Final   Bacteroides fragilis NOT DETECTED NOT DETECTED Final   Enterobacterales NOT DETECTED NOT DETECTED Final   Enterobacter cloacae complex NOT DETECTED NOT DETECTED Final   Escherichia coli NOT DETECTED NOT DETECTED Final   Klebsiella aerogenes NOT DETECTED NOT DETECTED Final   Klebsiella oxytoca NOT DETECTED NOT DETECTED Final   Klebsiella pneumoniae NOT DETECTED NOT DETECTED Final   Proteus species NOT DETECTED NOT DETECTED Final   Salmonella species NOT DETECTED NOT DETECTED Final   Serratia marcescens NOT DETECTED NOT DETECTED Final   Haemophilus influenzae NOT DETECTED NOT DETECTED Final   Neisseria meningitidis NOT DETECTED NOT DETECTED Final   Pseudomonas aeruginosa NOT DETECTED NOT DETECTED Final   Stenotrophomonas maltophilia NOT DETECTED NOT DETECTED Final   Candida albicans NOT DETECTED NOT DETECTED Final   Candida auris NOT DETECTED NOT DETECTED Final   Candida  glabrata NOT DETECTED NOT DETECTED Final   Candida krusei NOT DETECTED NOT DETECTED Final   Candida parapsilosis NOT DETECTED NOT DETECTED Final   Candida tropicalis NOT DETECTED NOT DETECTED Final   Cryptococcus neoformans/gattii NOT DETECTED NOT DETECTED Final    Comment: Performed at [facility], [place] [address], [place], [place] [address]  MRSA PCR Screening     Status: None   Collection Time: 03/01/21 10:03 PM   Specimen: Nasal Mucosa; Nasopharyngeal  Result Value Ref Range Status   MRSA by PCR NEGATIVE NEGATIVE Final    Comment:        The GeneXpert MRSA Assay (FDA approved for NASAL specimens only), is one component of a comprehensive MRSA colonization surveillance program. It is not intended to diagnose MRSA infection nor to guide or monitor treatment for MRSA infections. Performed at [facility], [place] [address] [place], [place] [address]   Blood culture (routine x 2)     Status: None (Preliminary result)   Collection Time: 03/01/21 10:53 PM   Specimen: BLOOD  Result Value Ref Range Status   Specimen Description BLOOD SITE NOT SPECIFIED  Final   Special Requests   Final    BOTTLES DRAWN AEROBIC ONLY Blood Culture results may not be optimal  due to an inadequate volume of blood received in culture bottles   Culture   Final    NO GROWTH 4 DAYS Performed at [facility], [place] [address] [place], [place] [address]    Report Status PENDING  Incomplete     Labs: Basic Metabolic Panel: Recent Labs  Lab 03/01/21 1753 03/01/21 1802 03/02/21 0130 03/02/21 0443 03/03/21 0139 03/05/21 0043  NA 137 138 135 136 137 139  K 3.7 3.7 4.4 4.5 4.6 3.9  CL 105 103 102  --  106 107  CO2 20*  --  21*  --  20* 22  GLUCOSE 167* 163* 143*  --  92 83  BUN 17 19 18   --  24* 11  CREATININE 1.33* 1.30* 1.25*  --  1.48* 1.14*  CALCIUM 9.2  --  8.9  --  8.9 8.5*  MG  --   --  1.8  --   --   --    Liver Function Tests: Recent Labs  Lab 03/01/21 1753  03/02/21 0130  AST 17 28  ALT 16 15  ALKPHOS 67 57  BILITOT 0.7 0.8  PROT 6.7 5.6*  ALBUMIN 3.6 3.1*   No results for input(s): LIPASE, AMYLASE in the last 168 hours. No results for input(s): AMMONIA in the last 168 hours. CBC: Recent Labs  Lab 03/01/21 2147 03/02/21 0130 03/02/21 0443 03/03/21 0139 03/04/21 0109 03/05/21 0043  WBC 10.1 10.2  --  8.7 6.8 5.8  NEUTROABS  --  9.2*  --   --   --   --   HGB 11.6* 12.0 10.5* 10.5* 9.0* 8.9*  HCT 33.8* 36.4 31.0* 31.2* 27.1* 26.3*  MCV 89.4 90.3  --  91.5 92.8 91.3  PLT 186 203  --  182 197 183   Cardiac Enzymes: No results for input(s): CKTOTAL, CKMB, CKMBINDEX, TROPONINI in the last 168 hours. BNP: BNP (last 3 results) No results for input(s): BNP in the last 8760 hours.  ProBNP (last 3 results) No results for input(s): PROBNP in the last 8760 hours.  CBG: Recent Labs  Lab 03/01/21 2137 03/02/21 0410 03/02/21 0715 03/03/21 0719  GLUCAP 128* 153* 128* 82       Signed:  [name] MD   [facility] 03/07/2021, 10:26 AM

## 2021-03-07 NOTE — Plan of Care (Signed)
Care plan had previously been resolved

## 2021-03-09 LAB — HEPARIN LEVEL (UNFRACTIONATED)
Heparin Unfractionated: 1.2 IU/mL — ABNORMAL HIGH (ref 0.30–0.70)
Heparin Unfractionated: 1.28 IU/mL — ABNORMAL HIGH (ref 0.30–0.70)

## 2021-03-26 DIAGNOSIS — I2699 Other pulmonary embolism without acute cor pulmonale: Secondary | ICD-10-CM | POA: Insufficient documentation

## 2021-03-26 NOTE — Progress Notes (Signed)
Cardiology Office Note   Date:  03/27/2021   ID:  Nicole Ruiz, DOB 31-Mar-1941, MRN [id number] PCP:  Bernerd Limbo, MD  Cardiologist:   No primary care provider on file.  Chief Complaint  Patient presents with  . Shortness of Breath      History of Present Illness: Nicole Ruiz is a 80 y.o. female who presents fo hospital follow up.  r.  She was in the hospital earlier this month.  I reviewed these records for this visit.    He had a submassive PE.  He was treated with tPA.   There was a DVT.  He had sinus bradycardia. He was admitted with respiratory failure .  Work up included an echo with a positive bubble study.  There was a normal LVEF and mildly reduced right systolic EF.  She said that initially she thought her breathing was a little bit better when she went home.  However, her husband says she has some memory problems.  Difficult to assess but she is breathless just walking across the room.  She is not describing PND or orthopnea.  She does have weakness and walks with a cane since she had a stroke several years ago.  She is not describing chest pressure, neck or arm discomfort.  She is not describing palpitations, presyncope or syncope.  She does have a knot in her superficial right lower leg.  This is somewhat tender to touch but there is no bruising or bleeding or drainage.  She has had no weight gain or edema.  She is not having any fevers or chills.  She is really not describing PND or orthopnea.  Walking around the office her oxygen saturation did fall to 79% became up immediately when resting.  It also stayed up when she was ambulating with 2 L of oxygen as described below.  I have not seen her since 2013.  She had a cardiac cath at that time without only a 30% LAD stenosis.        Past Medical History:  Diagnosis Date  . 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 Chronic kidney disease   . Depression   . Hyperlipidemia   . Hypertension   . Stroke [facility])      Past Surgical History:  Procedure Laterality Date  . ABDOMINAL HYSTERECTOMY    . ARM SURGERY    . CARDIAC CATHETERIZATION  2013   negative  . EP IMPLANTABLE DEVICE N/A 10/17/2015   Procedure: Loop Recorder Insertion;  Surgeon: Will Meredith Leeds, MD;  Location: [facility];  Service: Cardiovascular;  Laterality: N/A;  . EYE SURGERY    . FEMUR IM NAIL Right 10/20/2015   Procedure: INTRAMEDULLARY (IM) RETROGRADE FEMORAL NAILING;  Surgeon: Tania Ade, MD;  Location: [facility];  Service: Orthopedics;  Laterality: Right;  . HEMORROIDECTOMY    . KNEE SURGERY    . TEE WITHOUT CARDIOVERSION N/A 10/17/2015   Procedure: TRANSESOPHAGEAL ECHOCARDIOGRAM (TEE);  Surgeon: Jerline Pain, MD;  Location: [facility];  Service: Cardiovascular;  Laterality: N/A;     Current Outpatient Medications  Medication Sig Dispense Refill  . apixaban (ELIQUIS) 5 MG TABS tablet Take 1 tablet (5 mg total) by mouth 2 (two) times daily. 60 tablet 3  . aspirin EC 81 MG tablet Take by mouth.    [name] atorvastatin (LIPITOR) 20 MG tablet Take 20 mg by mouth daily.    [name] buPROPion (WELLBUTRIN XL) 150 MG 24 hr tablet  Take by mouth.    . Cholecalciferol (VITAMIN D3) 1000 units CAPS Take by mouth.    . Cyanocobalamin (VITAMIN B 12 PO) Take 1,000 mg by mouth.    . donepezil (ARICEPT) 10 MG tablet Take 10 mg by mouth daily.    [name] FLUoxetine (PROZAC) 40 MG capsule Take 1 capsule (40 mg total) by mouth daily. 30 capsule 3  . levothyroxine (SYNTHROID, LEVOTHROID) 50 MCG tablet     . Omega-3 Fatty Acids (FISH OIL) 1000 MG CAPS Take by mouth.    [name] omeprazole (PRILOSEC OTC) 20 MG tablet Take 20 mg by mouth daily.    . polyvinyl alcohol (LIQUIFILM TEARS) 1.4 % ophthalmic solution Place 1 drop into both eyes every hour as needed for dry eyes.    . primidone (MYSOLINE) 50 MG tablet Take 50 mg by mouth 2 (two) times daily. Take 2 tablets in the morning and 1 tablet in the evening.    [name] UNABLE TO FIND Mega red take one daily     . Vitamin D, Ergocalciferol, (DRISDOL) 1.25 MG (50000 UNIT) CAPS capsule Take 50,000 Units by mouth every 14 (fourteen) days.    . ALPRAZolam (XANAX XR) 0.5 MG 24 hr tablet Take 1 tablet (0.5 mg total) by mouth daily. 30 tablet 3  . cephALEXin (KEFLEX) 500 MG capsule Take 1 capsule (500 mg total) by mouth every 12 (twelve) hours. 4 capsule 0  . memantine (NAMENDA) 10 MG tablet Take 10 mg by mouth 2 (two) times daily.    . propranolol (INDERAL) 10 MG tablet Take 1 tablet (10 mg total) by mouth 2 (two) times daily.     No current facility-administered medications for this visit.    Allergies:   Patient has no known allergies.    Social History:  The patient  reports that she has never smoked. She has never used smokeless tobacco. She reports that she does not drink alcohol and does not use drugs.   Family History:  The patient's family history includes Heart disease (age of onset: 78) in her brother; Heart disease (age of onset: 51) in her mother; Stroke in her father.    ROS:  Please see the history of present illness.   Otherwise, review of systems are positive for none.   All other systems are reviewed and negative.    PHYSICAL EXAM: VS:  BP 111/78 (BP Location: Left Arm, Patient Position: Sitting)   Pulse 77   Ht 5\' 3"  (1.6 m)   Wt 138 lb 6.4 oz (62.8 kg)   SpO2 97%   BMI 24.52 kg/m  , BMI Body mass index is 24.52 kg/m. GENERAL:  Well appearing HEENT:  Pupils equal round and reactive, fundi not visualized, oral mucosa unremarkable NECK:  No jugular venous distention, waveform within normal limits, carotid upstroke brisk and symmetric, no bruits, no thyromegaly LYMPHATICS:  No cervical, inguinal adenopathy LUNGS:  Clear to auscultation bilaterally BACK:  No CVA tenderness CHEST:  Unremarkable HEART:  PMI not displaced or sustained,S1 and S2 within normal limits, no S3, no S4, no clicks, no rubs, no murmurs ABD:  Flat, positive bowel sounds normal in frequency in pitch, no  bruits, no rebound, no guarding, no midline pulsatile mass, no hepatomegaly, no splenomegaly EXT:  2 plus pulses throughout, no edema, no cyanosis no clubbing, small mobile slightly tender mass on the right calf SKIN:  No rashes no nodules NEURO:  Cranial nerves II through XII grossly intact, motor grossly intact throughout PSYCH:  Cognitively  intact, oriented to person place and time  SATURATION QUALIFICATIONS: (This note is used to comply with regulatory documentation for home oxygen)  Patient Saturations on Room Air at Rest = 94%  Patient Saturations on Room Air while Ambulating = 79%  Patient Saturations on 2 Liters of oxygen while Ambulating = 94%  Please briefly explain why patient needs home oxygen:  Patient has had a large PE and now has hypoxemia with ambulation responding to oxygen  EKG:  EKG is not ordered today. March 03, 2021 sinus bradycardia, no acute ST-T wave changes, intervals within normal limits except prolonged QT.   Recent Labs: 03/02/2021: ALT 15; Magnesium 1.8 03/04/2021: TSH 1.674 03/05/2021: BUN 11; Creatinine, Ser 1.14; Hemoglobin 8.9; Platelets 183; Potassium 3.9; Sodium 139    Lipid Panel    Component Value Date/Time   CHOL 167 03/10/2018 1005   TRIG 74 03/10/2018 1005   HDL 82 03/10/2018 1005   CHOLHDL 2.0 03/10/2018 1005   CHOLHDL 3.1 10/20/2015 0225   VLDL 11 10/20/2015 0225   LDLCALC 70 03/10/2018 1005      Wt Readings from Last 3 Encounters:  03/27/21 138 lb 6.4 oz (62.8 kg)  03/07/21 144 lb 6.4 oz (65.5 kg)  01/17/19 117 lb 9.6 oz (53.3 kg)      Other studies Reviewed: Additional studies/ records that were reviewed today include: Hospital records. Review of the above records demonstrates:  Please see elsewhere in the note.     ASSESSMENT AND PLAN:  PULMONARY EMBOLISM:   She is on adequate anticoagulation.  It is appropriate dosing.  I think her hypoxemia is related to this.  She is going to get oxygen prescribed as needed.  CHEST  PAIN:  This was thought to be related to the PE.  She is not having any further chest discomfort.  No further work-up.  For now I am going to continue the aspirin with her Coumadin but likely would discontinue as she gets further from this acute event and if there is no further chest pain.  Certainly if she has any bleeding we would discontinue the aspirin.  Of note she was slightly anemic in the hospital.  I will asked that she have a CBC drawn next week by her primary provider with the results sent to me.  HTN: Her blood pressure is well controlled.  No change in therapy.  LEG MASS: She has a small slightly tender leg mass that I do not think represents acute infection and may be just a superficial thrombosis.  This can be watched with warm compresses.  She should review with her primary provider if it persists.  Current medicines are reviewed at length with the patient today.  The patient does not have concerns regarding medicines.  The following changes have been made:  no change  Labs/ tests ordered today include: None No orders of the defined types were placed in this encounter.    Disposition:   FU with APP in 1 month.   Signed, Minus Breeding, MD  03/27/2021 1:42 PM    [facility]

## 2021-03-27 ENCOUNTER — Encounter: Payer: Self-pay | Admitting: Cardiology

## 2021-03-27 ENCOUNTER — Ambulatory Visit: Payer: Medicare HMO | Admitting: Cardiology

## 2021-03-27 ENCOUNTER — Other Ambulatory Visit: Payer: Self-pay

## 2021-03-27 VITALS — BP 111/78 | HR 77 | Ht 63.0 in | Wt 138.4 lb

## 2021-03-27 DIAGNOSIS — R0602 Shortness of breath: Secondary | ICD-10-CM | POA: Diagnosis not present

## 2021-03-27 DIAGNOSIS — I2699 Other pulmonary embolism without acute cor pulmonale: Secondary | ICD-10-CM

## 2021-03-27 DIAGNOSIS — Z79899 Other long term (current) drug therapy: Secondary | ICD-10-CM | POA: Diagnosis not present

## 2021-03-27 DIAGNOSIS — R072 Precordial pain: Secondary | ICD-10-CM

## 2021-03-27 NOTE — Patient Instructions (Addendum)
Medication Instructions:  Continue current medications  *If you need a refill on your cardiac medications before your next appointment, please call your pharmacy*   Lab Work: CBC  Testing/Procedures: None Ordered   Follow-Up: At [facility], you and your health needs are our priority.  As part of our continuing mission to provide you with exceptional heart care, we have created designated Provider Care Teams.  These Care Teams include your primary Cardiologist (physician) and Advanced Practice Providers (APPs -  Physician Assistants and Nurse Practitioners) who all work together to provide you with the care you need, when you need it.  We recommend signing up for the patient portal called "MyChart".  Sign up information is provided on this After Visit Summary.  MyChart is used to connect with patients for Virtual Visits (Telemedicine).  Patients are able to view lab/test results, encounter notes, upcoming appointments, etc.  Non-urgent messages can be sent to your provider as well.   To learn more about what you can do with MyChart, go to [email or web address].    Your next appointment:   1 month(s)  The format for your next appointment:   In Person  Provider:   Jory Sims, DNP

## 2021-03-31 NOTE — Addendum Note (Signed)
Addended by: Vennie Homans on: 03/31/2021 10:47 AM   Modules accepted: Orders

## 2021-04-05 LAB — CBC
Hematocrit: 36.5 % (ref 34.0–46.6)
Hemoglobin: 11.8 g/dL (ref 11.1–15.9)
MCH: 29.9 pg (ref 26.6–33.0)
MCHC: 32.3 g/dL (ref 31.5–35.7)
MCV: 92 fL (ref 79–97)
Platelets: 354 10*3/uL (ref 150–450)
RBC: 3.95 x10E6/uL (ref 3.77–5.28)
RDW: 12.9 % (ref 11.7–15.4)
WBC: 4.5 10*3/uL (ref 3.4–10.8)

## 2021-04-07 ENCOUNTER — Other Ambulatory Visit (HOSPITAL_COMMUNITY): Payer: Self-pay

## 2021-04-16 NOTE — Progress Notes (Deleted)
Cardiology Office Note   Date:  04/16/2021   ID:  LUS KRIEGEL, DOB 10-18-1941, MRN [id number]  PCP:  Bernerd Limbo, MD  Cardiologist: Dr.  Percival Spanish  No chief complaint on file.    History of Present Illness: Nicole Ruiz is a 80 y.o. female who presents for ongoing assessment and management of sinus bradycardia, CAD with most recent cardiac catheterization in 2013 revealing a 30% LAD stenosis..  The patient was recently admitted in March 2022 with dyspnea having a submassive PE and treated with tPA, positive for DVT noted.  Echocardiogram was completed with a positive bubble study. She had a normal LVEF with mildly reduced right systolic EF.  When seen last in the office by Dr. Percival Spanish on 03/27/2021 he did a walking O2 saturation which revealed that her oxygen decreased to 79% but came back up immediately when resting.  She was advised to stay on oxygen with ambulation at 2 L via nasal cannula.  Other history includes hyperlipidemia, hypothyroidism, hypertension, and CVA.  She was continued on anticoagulation with Coumadin, oxygen, in the setting of chronic dyspnea on exertion likely related to PE.  Blood pressure was well controlled.  It was noted that she had a small slightly tender leg mass that did not appear to have represented acute infection, and likely represented superficial thrombosis.  She was recommended for warm compresses and to follow-up with PCP if it was persistent.    Past Medical History:  Diagnosis Date  . 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 Chronic kidney disease   . Depression   . Hyperlipidemia   . Hypertension   . Stroke [facility])     Past Surgical History:  Procedure Laterality Date  . ABDOMINAL HYSTERECTOMY    . ARM SURGERY    . CARDIAC CATHETERIZATION  2013   negative  . EP IMPLANTABLE DEVICE N/A 10/17/2015   Procedure: Loop Recorder Insertion;  Surgeon: Will Meredith Leeds, MD;  Location: [place] CV LAB;  Service: Cardiovascular;   Laterality: N/A;  . EYE SURGERY    . FEMUR IM NAIL Right 10/20/2015   Procedure: INTRAMEDULLARY (IM) RETROGRADE FEMORAL NAILING;  Surgeon: Tania Ade, MD;  Location: [place];  Service: Orthopedics;  Laterality: Right;  . HEMORROIDECTOMY    . KNEE SURGERY    . TEE WITHOUT CARDIOVERSION N/A 10/17/2015   Procedure: TRANSESOPHAGEAL ECHOCARDIOGRAM (TEE);  Surgeon: Jerline Pain, MD;  Location: [facility] ENDOSCOPY;  Service: Cardiovascular;  Laterality: N/A;     Current Outpatient Medications  Medication Sig Dispense Refill  . ALPRAZolam (XANAX XR) 0.5 MG 24 hr tablet Take 1 tablet (0.5 mg total) by mouth daily. 30 tablet 3  . apixaban (ELIQUIS) 5 MG TABS tablet Take 1 tablet (5 mg total) by mouth 2 (two) times daily. 60 tablet 3  . apixaban (ELIQUIS) 5 MG TABS tablet TAKE 1 TABLET (5 MG TOTAL) BY MOUTH TWO TIMES DAILY. 60 tablet 3  . apixaban (ELIQUIS) 5 MG TABS tablet TAKE 2 TABLETS (10 MG TOTAL) BY MOUTH TWO TIMES DAILY FOR 5 DAYS. THEN TAKE 1 TABLET TWICE DAILY 74 tablet 0  . aspirin EC 81 MG tablet Take by mouth.    [name] atorvastatin (LIPITOR) 20 MG tablet Take 20 mg by mouth daily.    [name] buPROPion (WELLBUTRIN XL) 150 MG 24 hr tablet Take by mouth.    . cephALEXin (KEFLEX) 500 MG capsule Take 1 capsule (500 mg total) by mouth every 12 (twelve) hours. 4 capsule 0  .  cephALEXin (KEFLEX) 500 MG capsule TAKE 1 CAPSULE (500 MG TOTAL) BY MOUTH EVERY TWELVE HOURS. 4 capsule 0  . Cholecalciferol (VITAMIN D3) 1000 units CAPS Take by mouth.    . Cyanocobalamin (VITAMIN B 12 PO) Take 1,000 mg by mouth.    . donepezil (ARICEPT) 10 MG tablet Take 10 mg by mouth daily.    [name] FLUoxetine (PROZAC) 40 MG capsule Take 1 capsule (40 mg total) by mouth daily. 30 capsule 3  . levothyroxine (SYNTHROID, LEVOTHROID) 50 MCG tablet     . memantine (NAMENDA) 10 MG tablet Take 10 mg by mouth 2 (two) times daily.    . Omega-3 Fatty Acids (FISH OIL) 1000 MG CAPS Take by mouth.    [name] omeprazole (PRILOSEC OTC) 20 MG tablet Take 20  mg by mouth daily.    . polyvinyl alcohol (LIQUIFILM TEARS) 1.4 % ophthalmic solution Place 1 drop into both eyes every hour as needed for dry eyes.    . primidone (MYSOLINE) 50 MG tablet Take 50 mg by mouth 2 (two) times daily. Take 2 tablets in the morning and 1 tablet in the evening.    . propranolol (INDERAL) 10 MG tablet Take 1 tablet (10 mg total) by mouth 2 (two) times daily.    [name] UNABLE TO FIND Mega red take one daily    . Vitamin D, Ergocalciferol, (DRISDOL) 1.25 MG (50000 UNIT) CAPS capsule Take 50,000 Units by mouth every 14 (fourteen) days.     No current facility-administered medications for this visit.    Allergies:   Patient has no known allergies.    Social History:  The patient  reports that she has never smoked. She has never used smokeless tobacco. She reports that she does not drink alcohol and does not use drugs.   Family History:  The patient's family history includes Heart disease (age of onset: 20) in her brother; Heart disease (age of onset: 46) in her mother; Stroke in her father.    ROS: All other systems are reviewed and negative. Unless otherwise mentioned in H&P    PHYSICAL EXAM: VS:  There were no vitals taken for this visit. , BMI There is no height or weight on file to calculate BMI. GEN: Well nourished, well developed, in no acute distress HEENT: normal Neck: no JVD, carotid bruits, or masses Cardiac: ***RRR; no murmurs, rubs, or gallops,no edema  Respiratory:  Clear to auscultation bilaterally, normal work of breathing GI: soft, nontender, nondistended, + BS MS: no deformity or atrophy Skin: warm and dry, no rash Neuro:  Strength and sensation are intact Psych: euthymic mood, full affect   EKG:  EKG {ACTION; IS/IS [id number]} ordered today. The ekg ordered today demonstrates ***   Recent Labs: 22-Mar-2021: ALT 15; Magnesium 1.8 03/04/2021: TSH 1.674 03/05/2021: BUN 11; Creatinine, Ser 1.14; Potassium 3.9; Sodium 139 04/04/2021: Hemoglobin 11.8;  Platelets 354    Lipid Panel    Component Value Date/Time   CHOL 167 03/10/2018 1005   TRIG 74 03/10/2018 1005   HDL 82 03/10/2018 1005   CHOLHDL 2.0 03/10/2018 1005   CHOLHDL 3.1 10/20/2015 0225   VLDL 11 10/20/2015 0225   LDLCALC 70 03/10/2018 1005      Wt Readings from Last 3 Encounters:  03/27/21 138 lb 6.4 oz (62.8 kg)  03/07/21 144 lb 6.4 oz (65.5 kg)  01/17/19 117 lb 9.6 oz (53.3 kg)      Other studies Reviewed: Echocardiogram March 22, 2021  1. Left ventricular ejection fraction, by estimation, is 60  to 65%. The  left ventricle has normal function. The left ventricle has no regional  wall motion abnormalities. There is moderate concentric left ventricular  hypertrophy. Left ventricular  diastolic parameters were normal.  2. Right ventricular systolic function is mildly reduced. The right  ventricular size is mildly enlarged. There is normal pulmonary artery  systolic pressure.  3. No clear color doppler flow seen across intraatrial septum, but  limited images. 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 Agitated saline contrast  bubble study was positive with shunting  observed within 3-6 cardiac cycles suggestive of interatrial shunt.  4. Right atrial size was mildly dilated.  5. The mitral valve is normal in structure. Mild mitral valve  regurgitation. No evidence of mitral stenosis.  6. The aortic valve is tricuspid. Aortic valve regurgitation is not  visualized. No aortic stenosis is present.    ASSESSMENT AND PLAN:  1.  ***   Current medicines are reviewed at length with the patient today.  I have spent *** dedicated to the care of this patient on the date of this encounter to include pre-visit review of records, assessment, management and diagnostic testing,with shared decision making.  Labs/ tests ordered today include: *** Phill Myron. West Pugh, ANP, AACC   04/16/2021 4:57 PM    [facility] [place] [address] Office [phone] Fax [phone]  Notice: This dictation was prepared with Dragon dictation along with smaller phrase technology. Any transcriptional errors that result from this process are unintentional and may not be corrected upon review.

## 2021-04-18 ENCOUNTER — Ambulatory Visit: Payer: Medicare HMO | Admitting: Adult Health

## 2021-06-17 NOTE — Progress Notes (Signed)
Cardiology Office Note   Date:  06/18/2021   ID:  Nicole Ruiz, DOB [birth year], MRN [id number] PCP:  Bernerd Limbo, MD  Cardiologist:   None  Chief Complaint  Patient presents with   Shortness of Breath       History of Present Illness: Nicole Ruiz is a 80 y.o. female who presents fo hospital follow up.    She was in the hospital in March.  She  had a submassive PE.  She was treated with tPA.   There was a DVT.  She had sinus bradycardia. Work up included an echo with a positive bubble study.  There was a normal LVEF and mildly reduced right systolic EF.    She has had for years multiple somatic complaints with dizziness and weakness diffuse muscle aches.  These limit her and she walks with a cane.  She gets dizzy moving around.  She does not really leave the house very much.  Sounds like she is very sedentary.  She has had no new problems since I last saw her including no problems with her anticoagulation.  She is not reporting any bleeding issues.  She wears her oxygen sometimes.  She puts it on at night if she is short of breath.  She really does not check her oxygen saturations.    Of note when we ambulated her in the office this time she went down to the 91% and was short of breath but this is much better than the 79% she had at the last visit.   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[place])    Chronic kidney disease    Depression    Hyperlipidemia    Hypertension    Stroke [facility])     Past Surgical History:  Procedure Laterality Date   ABDOMINAL HYSTERECTOMY     ARM SURGERY     CARDIAC CATHETERIZATION  2013   negative   EP IMPLANTABLE DEVICE N/A 10/17/2015   Procedure: Loop Recorder Insertion;  Surgeon: Will Meredith Leeds, MD;  Location: [place] CV [facility];  Service: Cardiovascular;  Laterality: N/A;   EYE SURGERY     FEMUR IM NAIL Right 10/20/2015   Procedure: INTRAMEDULLARY (IM) RETROGRADE FEMORAL NAILING;  Surgeon:  Tania Ade, MD;  Location: [place];  Service: Orthopedics;  Laterality: Right;   HEMORROIDECTOMY     KNEE SURGERY     TEE WITHOUT CARDIOVERSION N/A 10/17/2015   Procedure: TRANSESOPHAGEAL ECHOCARDIOGRAM (TEE);  Surgeon: Jerline Pain, MD;  [facility]: [facility] ENDOSCOPY;  Service: Cardiovascular;  Laterality: N/A;     Current Outpatient Medications  Medication Sig Dispense Refill   ALPRAZolam (XANAX XR) 0.5 MG 24 hr tablet Take 1 tablet (0.5 mg total) by mouth daily. 30 tablet 3   apixaban (ELIQUIS) 5 MG TABS tablet TAKE 1 TABLET (5 MG TOTAL) BY MOUTH TWO TIMES DAILY. 60 tablet 3   atorvastatin (LIPITOR) 20 MG tablet Take 20 mg by mouth daily.     buPROPion (WELLBUTRIN XL) 150 MG 24 hr tablet Take by mouth.     cephALEXin (KEFLEX) 500 MG capsule Take 1 capsule (500 mg total) by mouth every 12 (twelve) hours. 4 capsule 0   cephALEXin (KEFLEX) 500 MG capsule TAKE 1 CAPSULE (500 MG TOTAL) BY MOUTH EVERY TWELVE HOURS. 4 capsule 0   Cholecalciferol (VITAMIN D3) 1000 units CAPS Take by mouth.     Cyanocobalamin (VITAMIN B 12 PO) Take 1,000 mg by mouth.  donepezil (ARICEPT) 10 MG tablet Take 10 mg by mouth daily.     levothyroxine (SYNTHROID, LEVOTHROID) 50 MCG tablet      memantine (NAMENDA) 10 MG tablet Take 10 mg by mouth 2 (two) times daily.     Omega-3 Fatty Acids (FISH OIL) 1000 MG CAPS Take by mouth.     omeprazole (PRILOSEC OTC) 20 MG tablet Take 20 mg by mouth daily.     polyvinyl alcohol (LIQUIFILM TEARS) 1.4 % ophthalmic solution Place 1 drop into both eyes every hour as needed for dry eyes.     primidone (MYSOLINE) 50 MG tablet Take 50 mg by mouth 2 (two) times daily. Take 2 tablets in the morning and 1 tablet in the evening.     propranolol (INDERAL) 10 MG tablet Take 1 tablet (10 mg total) by mouth 2 (two) times daily.     UNABLE TO FIND Mega red take one daily     Vitamin D, Ergocalciferol, (DRISDOL) 1.25 MG (50000 UNIT) CAPS capsule Take 50,000 Units by mouth every 14 (fourteen)  days.     FLUoxetine (PROZAC) 40 MG capsule Take 1 capsule (40 mg total) by mouth daily. 30 capsule 3   No current facility-administered medications for this visit.    Allergies:   Patient has no known allergies.    ROS:  Please see the history of present illness.   Otherwise, review of systems are positive for none.   All other systems are reviewed and negative.    PHYSICAL EXAM: VS:  BP 138/62   Pulse 62   Ht 5\' 3"  (1.6 m)   Wt 146 lb 6.4 oz (66.4 kg)   SpO2 95%   BMI 25.93 kg/m  , BMI Body mass index is 25.93 kg/m. GENERAL: Frail appearing appearing NECK:  No jugular venous distention, waveform within normal limits, carotid upstroke brisk and symmetric, no bruits, no thyromegaly LUNGS:  Clear to auscultation bilaterally CHEST:  Unremarkable HEART:  PMI not displaced or sustained,S1 and S2 within normal limits, no S3, no S4, no clicks, no rubs, no murmurs ABD:  Flat, positive bowel sounds normal in frequency in pitch, no bruits, no rebound, no guarding, no midline pulsatile mass, no hepatomegaly, no splenomegaly EXT:  2 plus pulses throughout, mild right greater than left leg edema, no cyanosis no clubbing   EKG:  EKG is not ordered today.    Recent Labs: 03/02/2021: ALT 15; Magnesium 1.8 03/04/2021: TSH 1.674 03/05/2021: BUN 11; Creatinine, Ser 1.14; Potassium 3.9; Sodium 139 04/04/2021: Hemoglobin 11.8; Platelets 354    Lipid Panel    Component Value Date/Time   CHOL 167 03/10/2018 1005   TRIG 74 03/10/2018 1005   HDL 82 03/10/2018 1005   CHOLHDL 2.0 03/10/2018 1005   CHOLHDL 3.1 10/20/2015 0225   VLDL 11 10/20/2015 0225   LDLCALC 70 03/10/2018 1005      Wt Readings from Last 3 Encounters:  06/18/21 146 lb 6.4 oz (66.4 kg)  03/27/21 138 lb 6.4 oz (62.8 kg)  03/07/21 144 lb 6.4 oz (65.5 kg)      Other studies Reviewed: Additional studies/ records that were reviewed today include: None Review of the above records demonstrates: NA  ASSESSMENT AND  PLAN:  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  She will remain on anticoagulation indefinitely.  I will check a CBC.  CHEST PAIN: She has a nonanginal chest pain that has been chronic.  She had a catheterization in 2013 because of this and there was  no obstructive coronary disease.  I do not think she needs the aspirin as she is at higher risk of bleeding.   HTN: Her blood pressure is controlled.  No change in therapy.   Current medicines are reviewed at length with the patient today.  The patient does not have concerns regarding medicines.  The following changes have been made:  ASA  Labs/ tests ordered today include: None  Orders Placed This Encounter  Procedures   CBC      Disposition:   FU with me in one year.    Signed, Minus Breeding, MD  06/18/2021 2:41 PM    [facility]

## 2021-06-18 ENCOUNTER — Ambulatory Visit: Payer: Medicare HMO | Admitting: Cardiology

## 2021-06-18 ENCOUNTER — Encounter: Payer: Self-pay | Admitting: Cardiology

## 2021-06-18 ENCOUNTER — Other Ambulatory Visit: Payer: Self-pay

## 2021-06-18 VITALS — BP 138/62 | HR 62 | Ht 63.0 in | Wt 146.4 lb

## 2021-06-18 DIAGNOSIS — I2699 Other pulmonary embolism without acute cor pulmonale: Secondary | ICD-10-CM

## 2021-06-18 DIAGNOSIS — I10 Essential (primary) hypertension: Secondary | ICD-10-CM | POA: Diagnosis not present

## 2021-06-18 DIAGNOSIS — R072 Precordial pain: Secondary | ICD-10-CM | POA: Diagnosis not present

## 2021-06-18 LAB — CBC
Hematocrit: 35.9 % (ref 34.0–46.6)
Hemoglobin: 11.6 g/dL (ref 11.1–15.9)
MCH: 29.4 pg (ref 26.6–33.0)
MCHC: 32.3 g/dL (ref 31.5–35.7)
MCV: 91 fL (ref 79–97)
Platelets: 320 10*3/uL (ref 150–450)
RBC: 3.95 x10E6/uL (ref 3.77–5.28)
RDW: 13.1 % (ref 11.7–15.4)
WBC: 5.6 10*3/uL (ref 3.4–10.8)

## 2021-06-18 NOTE — Patient Instructions (Signed)
Medication Instructions:  STOP aspirin  *If you need a refill on your cardiac medications before your next appointment, please call your pharmacy*   Lab Work: CBC to be drawn today  If you have labs (blood work) drawn today and your tests are completely normal, you will receive your results only by: [place] (if you have MyChart) OR A paper copy in the mail If you have any lab test that is abnormal or we need to change your treatment, we will call you to review the results.   Testing/Procedures: None ordered.    Follow-Up: At [facility], you and your health needs are our priority.  As part of our continuing mission to provide you with exceptional heart care, we have created designated Provider Care Teams.  These Care Teams include your primary Cardiologist (physician) and Advanced Practice Providers (APPs -  Physician Assistants and Nurse Practitioners) who all work together to provide you with the care you need, when you need it.  We recommend signing up for the patient portal called "MyChart".  Sign up information is provided on this After Visit Summary.  MyChart is used to connect with patients for Virtual Visits (Telemedicine).  Patients are able to view lab/test results, encounter notes, upcoming appointments, etc.  Non-urgent messages can be sent to your provider as well.   To learn more about what you can do with MyChart, go to [email or web address].    Your next appointment:   12 month(s)  The format for your next appointment:   In Person  Provider:   Minus Breeding, MD

## 2021-06-20 ENCOUNTER — Encounter: Payer: Self-pay | Admitting: *Deleted

## 2021-10-01 ENCOUNTER — Emergency Department (HOSPITAL_COMMUNITY): Payer: Medicare HMO

## 2021-10-01 ENCOUNTER — Other Ambulatory Visit: Payer: Self-pay

## 2021-10-01 ENCOUNTER — Observation Stay (HOSPITAL_COMMUNITY): Payer: Medicare HMO

## 2021-10-01 ENCOUNTER — Inpatient Hospital Stay (HOSPITAL_COMMUNITY)
Admission: EM | Admit: 2021-10-01 | Discharge: 2021-10-16 | DRG: 682 | Disposition: A | Discharge status: Skilled Nursing Facility | Payer: Medicare HMO | Attending: Internal Medicine | Admitting: Internal Medicine

## 2021-10-01 ENCOUNTER — Encounter (HOSPITAL_COMMUNITY): Payer: Self-pay | Admitting: Emergency Medicine

## 2021-10-01 DIAGNOSIS — D62 Acute posthemorrhagic anemia: Secondary | ICD-10-CM | POA: Diagnosis not present

## 2021-10-01 DIAGNOSIS — K254 Chronic or unspecified gastric ulcer with hemorrhage: Secondary | ICD-10-CM | POA: Diagnosis present

## 2021-10-01 DIAGNOSIS — R6251 Failure to thrive (child): Secondary | ICD-10-CM

## 2021-10-01 DIAGNOSIS — F0393 Unspecified dementia, unspecified severity, with mood disturbance: Secondary | ICD-10-CM | POA: Diagnosis present

## 2021-10-01 DIAGNOSIS — W19XXXA Unspecified fall, initial encounter: Secondary | ICD-10-CM | POA: Diagnosis present

## 2021-10-01 DIAGNOSIS — Z823 Family history of stroke: Secondary | ICD-10-CM

## 2021-10-01 DIAGNOSIS — Z8249 Family history of ischemic heart disease and other diseases of the circulatory system: Secondary | ICD-10-CM

## 2021-10-01 DIAGNOSIS — I129 Hypertensive chronic kidney disease with stage 1 through stage 4 chronic kidney disease, or unspecified chronic kidney disease: Secondary | ICD-10-CM | POA: Diagnosis present

## 2021-10-01 DIAGNOSIS — E872 Acidosis, unspecified: Secondary | ICD-10-CM | POA: Diagnosis present

## 2021-10-01 DIAGNOSIS — K449 Diaphragmatic hernia without obstruction or gangrene: Secondary | ICD-10-CM | POA: Diagnosis present

## 2021-10-01 DIAGNOSIS — Z8673 Personal history of transient ischemic attack (TIA), and cerebral infarction without residual deficits: Secondary | ICD-10-CM

## 2021-10-01 DIAGNOSIS — E46 Unspecified protein-calorie malnutrition: Secondary | ICD-10-CM | POA: Diagnosis present

## 2021-10-01 DIAGNOSIS — N182 Chronic kidney disease, stage 2 (mild): Secondary | ICD-10-CM | POA: Diagnosis present

## 2021-10-01 DIAGNOSIS — I700 Atherosclerosis of aorta: Secondary | ICD-10-CM | POA: Diagnosis present

## 2021-10-01 DIAGNOSIS — K222 Esophageal obstruction: Secondary | ICD-10-CM | POA: Diagnosis present

## 2021-10-01 DIAGNOSIS — R627 Adult failure to thrive: Secondary | ICD-10-CM | POA: Diagnosis present

## 2021-10-01 DIAGNOSIS — Z86711 Personal history of pulmonary embolism: Secondary | ICD-10-CM

## 2021-10-01 DIAGNOSIS — K219 Gastro-esophageal reflux disease without esophagitis: Secondary | ICD-10-CM | POA: Diagnosis present

## 2021-10-01 DIAGNOSIS — R112 Nausea with vomiting, unspecified: Secondary | ICD-10-CM | POA: Diagnosis present

## 2021-10-01 DIAGNOSIS — R1312 Dysphagia, oropharyngeal phase: Secondary | ICD-10-CM | POA: Diagnosis present

## 2021-10-01 DIAGNOSIS — G928 Other toxic encephalopathy: Secondary | ICD-10-CM | POA: Diagnosis present

## 2021-10-01 DIAGNOSIS — G9341 Metabolic encephalopathy: Secondary | ICD-10-CM | POA: Diagnosis present

## 2021-10-01 DIAGNOSIS — Y92009 Unspecified place in unspecified non-institutional (private) residence as the place of occurrence of the external cause: Secondary | ICD-10-CM

## 2021-10-01 DIAGNOSIS — D649 Anemia, unspecified: Secondary | ICD-10-CM | POA: Clinically undetermined

## 2021-10-01 DIAGNOSIS — Z7901 Long term (current) use of anticoagulants: Secondary | ICD-10-CM

## 2021-10-01 DIAGNOSIS — Z79899 Other long term (current) drug therapy: Secondary | ICD-10-CM

## 2021-10-01 DIAGNOSIS — K259 Gastric ulcer, unspecified as acute or chronic, without hemorrhage or perforation: Secondary | ICD-10-CM | POA: Diagnosis present

## 2021-10-01 DIAGNOSIS — D509 Iron deficiency anemia, unspecified: Secondary | ICD-10-CM | POA: Diagnosis present

## 2021-10-01 DIAGNOSIS — E785 Hyperlipidemia, unspecified: Secondary | ICD-10-CM | POA: Diagnosis present

## 2021-10-01 DIAGNOSIS — Z79890 Hormone replacement therapy (postmenopausal): Secondary | ICD-10-CM

## 2021-10-01 DIAGNOSIS — N390 Urinary tract infection, site not specified: Secondary | ICD-10-CM | POA: Diagnosis present

## 2021-10-01 DIAGNOSIS — Z20822 Contact with and (suspected) exposure to covid-19: Secondary | ICD-10-CM | POA: Diagnosis present

## 2021-10-01 DIAGNOSIS — E538 Deficiency of other specified B group vitamins: Secondary | ICD-10-CM | POA: Diagnosis present

## 2021-10-01 DIAGNOSIS — E039 Hypothyroidism, unspecified: Secondary | ICD-10-CM | POA: Diagnosis present

## 2021-10-01 DIAGNOSIS — E860 Dehydration: Secondary | ICD-10-CM | POA: Diagnosis not present

## 2021-10-01 DIAGNOSIS — K253 Acute gastric ulcer without hemorrhage or perforation: Secondary | ICD-10-CM

## 2021-10-01 DIAGNOSIS — M79601 Pain in right arm: Secondary | ICD-10-CM

## 2021-10-01 DIAGNOSIS — F32A Depression, unspecified: Secondary | ICD-10-CM | POA: Diagnosis present

## 2021-10-01 DIAGNOSIS — N179 Acute kidney failure, unspecified: Principal | ICD-10-CM | POA: Diagnosis present

## 2021-10-01 DIAGNOSIS — R310 Gross hematuria: Secondary | ICD-10-CM | POA: Diagnosis not present

## 2021-10-01 DIAGNOSIS — R319 Hematuria, unspecified: Secondary | ICD-10-CM | POA: Clinically undetermined

## 2021-10-01 DIAGNOSIS — I809 Phlebitis and thrombophlebitis of unspecified site: Secondary | ICD-10-CM | POA: Diagnosis not present

## 2021-10-01 DIAGNOSIS — E876 Hypokalemia: Secondary | ICD-10-CM | POA: Diagnosis not present

## 2021-10-01 DIAGNOSIS — B965 Pseudomonas (aeruginosa) (mallei) (pseudomallei) as the cause of diseases classified elsewhere: Secondary | ICD-10-CM | POA: Diagnosis present

## 2021-10-01 DIAGNOSIS — T148XXA Other injury of unspecified body region, initial encounter: Secondary | ICD-10-CM

## 2021-10-01 LAB — RAPID URINE DRUG SCREEN, HOSP PERFORMED
Amphetamines: NOT DETECTED
Barbiturates: POSITIVE — AB
Benzodiazepines: NOT DETECTED
Cocaine: NOT DETECTED
Opiates: NOT DETECTED
Tetrahydrocannabinol: NOT DETECTED

## 2021-10-01 LAB — COMPREHENSIVE METABOLIC PANEL
ALT: 24 U/L (ref 0–44)
AST: 36 U/L (ref 15–41)
Albumin: 3.6 g/dL (ref 3.5–5.0)
Alkaline Phosphatase: 80 U/L (ref 38–126)
Anion gap: >20 — ABNORMAL HIGH (ref 5–15)
BUN: 32 mg/dL — ABNORMAL HIGH (ref 8–23)
CO2: 22 mmol/L (ref 22–32)
Calcium: 9.5 mg/dL (ref 8.9–10.3)
Chloride: 99 mmol/L (ref 98–111)
Creatinine, Ser: 1.44 mg/dL — ABNORMAL HIGH (ref 0.44–1.00)
GFR, Estimated: 37 mL/min — ABNORMAL LOW (ref >60)
Glucose, Bld: 118 mg/dL — ABNORMAL HIGH (ref 70–99)
Potassium: 3.4 mmol/L — ABNORMAL LOW (ref 3.5–5.1)
Sodium: 144 mmol/L (ref 135–145)
Total Bilirubin: 1.2 mg/dL (ref 0.3–1.2)
Total Protein: 7.6 g/dL (ref 6.5–8.1)

## 2021-10-01 LAB — CBC WITH DIFFERENTIAL/PLATELET
Abs Immature Granulocytes: 0.08 10*3/uL — ABNORMAL HIGH (ref 0.00–0.07)
Basophils Absolute: 0 10*3/uL (ref 0.0–0.1)
Basophils Relative: 0 %
Eosinophils Absolute: 0 10*3/uL (ref 0.0–0.5)
Eosinophils Relative: 0 %
HCT: 43.7 % (ref 36.0–46.0)
Hemoglobin: 14.6 g/dL (ref 12.0–15.0)
Immature Granulocytes: 1 %
Lymphocytes Relative: 6 %
Lymphs Abs: 0.7 10*3/uL (ref 0.7–4.0)
MCH: 30.2 pg (ref 26.0–34.0)
MCHC: 33.4 g/dL (ref 30.0–36.0)
MCV: 90.3 fL (ref 80.0–100.0)
Monocytes Absolute: 0.6 10*3/uL (ref 0.1–1.0)
Monocytes Relative: 5 %
Neutro Abs: 9.9 10*3/uL — ABNORMAL HIGH (ref 1.7–7.7)
Neutrophils Relative %: 88 %
Platelets: 278 10*3/uL (ref 150–400)
RBC: 4.84 MIL/uL (ref 3.87–5.11)
RDW: 15.5 % (ref 11.5–15.5)
WBC: 11.4 10*3/uL — ABNORMAL HIGH (ref 4.0–10.5)
nRBC: 0 % (ref 0.0–0.2)

## 2021-10-01 LAB — URINALYSIS, ROUTINE W REFLEX MICROSCOPIC
Bilirubin Urine: NEGATIVE
Glucose, UA: NEGATIVE mg/dL
Ketones, ur: 80 mg/dL — AB
Nitrite: NEGATIVE
Protein, ur: 100 mg/dL — AB
Specific Gravity, Urine: 1.019 (ref 1.005–1.030)
WBC, UA: >50 WBC/hpf — ABNORMAL HIGH (ref 0–5)
pH: 5 (ref 5.0–8.0)

## 2021-10-01 LAB — SALICYLATE LEVEL: Salicylate Lvl: <7 mg/dL — ABNORMAL LOW (ref 7.0–30.0)

## 2021-10-01 LAB — CBC
HCT: 37.2 % (ref 36.0–46.0)
Hemoglobin: 12.2 g/dL (ref 12.0–15.0)
MCH: 29.7 pg (ref 26.0–34.0)
MCHC: 32.8 g/dL (ref 30.0–36.0)
MCV: 90.5 fL (ref 80.0–100.0)
Platelets: 187 10*3/uL (ref 150–400)
RBC: 4.11 MIL/uL (ref 3.87–5.11)
RDW: 15.2 % (ref 11.5–15.5)
WBC: 10.9 10*3/uL — ABNORMAL HIGH (ref 4.0–10.5)
nRBC: 0 % (ref 0.0–0.2)

## 2021-10-01 LAB — CREATININE, SERUM
Creatinine, Ser: 1.14 mg/dL — ABNORMAL HIGH (ref 0.44–1.00)
GFR, Estimated: 49 mL/min — ABNORMAL LOW (ref >60)

## 2021-10-01 LAB — ETHANOL: Alcohol, Ethyl (B): <10 mg/dL (ref <10)

## 2021-10-01 LAB — LIPASE, BLOOD: Lipase: 50 U/L (ref 11–51)

## 2021-10-01 LAB — RESP PANEL BY RT-PCR (FLU A&B, COVID) ARPGX2
Influenza A by PCR: NEGATIVE
Influenza B by PCR: NEGATIVE
SARS Coronavirus 2 by RT PCR: NEGATIVE

## 2021-10-01 LAB — ACETAMINOPHEN LEVEL: Acetaminophen (Tylenol), Serum: <10 ug/mL — ABNORMAL LOW (ref 10–30)

## 2021-10-01 MED ORDER — ONDANSETRON HCL 4 MG/2ML IJ SOLN
4.0000 mg | Freq: Four times a day (QID) | INTRAMUSCULAR | Status: AC | PRN
  Administered 2021-10-01 – 2021-10-02 (×2): 4 mg via INTRAVENOUS
  Filled 2021-10-01 (×2): qty 2

## 2021-10-01 MED ORDER — LACTATED RINGERS IV SOLN: INTRAVENOUS | Status: DC

## 2021-10-01 MED ORDER — HEPARIN SODIUM (PORCINE) 5000 UNIT/ML IJ SOLN
5000.0000 [IU] | Freq: Three times a day (TID) | INTRAMUSCULAR | Status: DC
  Administered 2021-10-01 – 2021-10-07 (×18): 5000 [IU] via SUBCUTANEOUS
  Filled 2021-10-01 (×17): qty 1

## 2021-10-01 MED ORDER — LACTATED RINGERS IV BOLUS
1000.0000 mL | Freq: Once | INTRAVENOUS | Status: AC
  Administered 2021-10-01: 1000 mL via INTRAVENOUS

## 2021-10-01 NOTE — ED Triage Notes (Signed)
Pt BIBA.  Per EMS pt c/o nausea, vomiting, diarrhea x 3 weeks. Pt reports she starting feeling "sick" 3 weeks ago. Husband reports pt has had decreased appetite over the past 2 weeks. Per EMS history is very limited from pt and husband.    CBG 129 168/88 86 HR 97% room air.

## 2021-10-01 NOTE — H&P (Signed)
History and Physical    Nicole Ruiz WFU:[id number] DOB: [birth year] DOA: 10/01/2021  PCP: Bernerd Limbo, MD   Chief Complaint: Intractable Nausea/vomiting  HPI: Nicole Ruiz is a 80 y.o. female with medical history significant of hypertension, hyperlipidemia, CKD stage II, depression who presents with intractable nausea vomiting and poor p.o. intake with dehydration over the past 3 to 4 days.  Patient denies any sick contacts recent travel or change in dietary habits.  Apparently some 4 days prior to admission she was having some mild nausea symptoms with poor p.o. intake which continued to worsen over the past 4 days now with intractable symptoms and abnormal labs as evaluated in the ED with elevated creatinine from baseline and an anion gap greater than 20.  Hospitalist called to admit.  Review of Systems: As per HPI, otherwise unremarkable denies chest pain headache fevers chills shortness of breath sick contacts recent travel medication changes.   Assessment/Plan Active Problems:   AKI (acute kidney injury) ([place])   Acute kidney injury on CKD 2 -Creatinine baseline around 1.2, admitted at 1.4 -Continue LR, increase p.o. intake as tolerated currently on full liquid diet  Intractable nausea vomiting, rule out infectious process -Unclear etiology, suspect viral given somewhat abrupt onset without any sick contacts or dietary changes unlikely foodborne illness -Continue supportive care Zofran and Phenergan IV fluids until p.o. intake improves  Anion gap metabolic acidosis of unclear etiology -Unclear etiology, BUN 20U, salicylate acetaminophen and ethanol levels pending, UDS pending as is UA -Patient is extremely poor historian, unable to verify or confirm any home medications over-the-counter or prescription, unclear if patient is inadvertently taking too many over-the-counter medications or prescription medications at this point  DVT prophylaxis: Heparin Code Status: Full Family  Communication: Husband at bedside Status is: Inpatient  Dispo: The patient is from: Home              Anticipated d/c is to: Home              Anticipated d/c date is: 24 to 48 hours              Patient currently not medically stable for discharge given ongoing need for IV fluids poor p.o. 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[place])    Chronic kidney disease    Depression    Hyperlipidemia    Hypertension    Stroke [facility])     Past Surgical History:  Procedure Laterality Date   ABDOMINAL HYSTERECTOMY     [place]  2013   negative   EP IMPLANTABLE DEVICE N/A 10/17/2015   Procedure: Loop Recorder Insertion;  Surgeon: Will Meredith Leeds, MD;  Location: [facility];  Service: Cardiovascular;  Laterality: N/A;   EYE SURGERY     FEMUR IM NAIL Right 10/20/2015   Procedure: INTRAMEDULLARY (IM) RETROGRADE FEMORAL NAILING;  Surgeon: Tania Ade, MD;  Location: [facility];  Service: Orthopedics;  Laterality: Right;   HEMORROIDECTOMY     KNEE SURGERY     TEE WITHOUT CARDIOVERSION N/A 10/17/2015   Procedure: TRANSESOPHAGEAL ECHOCARDIOGRAM (TEE);  Surgeon: Jerline Pain, MD;  Location: [facility] ENDOSCOPY;  Service: Cardiovascular;  Laterality: N/A;     reports that she has never smoked. She has never used smokeless tobacco. She reports that she does not drink alcohol and does not use drugs.  No Known Allergies  Family History  Problem Relation Age of Onset   Heart disease Mother 78   Stroke Father    Heart disease Brother 62    Prior to Admission medications   Medication Sig Start Date End Date Taking? Authorizing Provider  ALPRAZolam (XANAX XR) 0.5 MG 24 hr tablet Take 1 tablet (0.5 mg total) by mouth daily. 08/17/18   Frann Rider, NP  apixaban (ELIQUIS) 5 MG TABS tablet TAKE 1 TABLET (5 MG TOTAL) BY MOUTH TWO TIMES DAILY.  03/06/21 03/06/22  Nita Sells, MD  atorvastatin (LIPITOR) 20 MG tablet Take 20 mg by mouth daily. 08/16/18   [provider]  buPROPion (WELLBUTRIN XL) 150 MG 24 hr tablet Take by mouth. 06/23/18   [provider]  cephALEXin (KEFLEX) 500 MG capsule Take 1 capsule (500 mg total) by mouth every 12 (twelve) hours. 03/06/21   Nita Sells, MD  cephALEXin (KEFLEX) 500 MG capsule TAKE 1 CAPSULE (500 MG TOTAL) BY MOUTH EVERY TWELVE HOURS. 03/06/21 03/06/22  Nita Sells, MD  Cholecalciferol (VITAMIN D3) 1000 units CAPS Take by mouth.    [provider]  Cyanocobalamin (VITAMIN B 12 PO) Take 1,000 mg by mouth.    [provider]  donepezil (ARICEPT) 10 MG tablet Take 10 mg by mouth daily. 12/24/20   [provider]  FLUoxetine (PROZAC) 40 MG capsule Take 1 capsule (40 mg total) by mouth daily. 05/17/18 05/17/19  Frann Rider, NP  levothyroxine (SYNTHROID, LEVOTHROID) 50 MCG tablet  07/27/18   [provider]  memantine (NAMENDA) 10 MG tablet Take 10 mg by mouth 2 (two) times daily. 12/11/20   [provider]  Omega-3 Fatty Acids (FISH OIL) 1000 MG CAPS Take by mouth.    [provider]  omeprazole (PRILOSEC OTC) 20 MG tablet Take 20 mg by mouth daily.    [provider]  polyvinyl alcohol (LIQUIFILM TEARS) 1.4 % ophthalmic solution Place 1 drop into both eyes every hour as needed for dry eyes.    [provider]  primidone (MYSOLINE) 50 MG tablet Take 50 mg by mouth 2 (two) times daily. Take 2 tablets in the morning and 1 tablet in the evening.    [provider]  propranolol (INDERAL) 10 MG tablet Take 1 tablet (10 mg total) by mouth 2 (two) times daily. 03/06/21   Nita Sells, MD  UNABLE TO FIND Mega red take one daily    [provider]  Vitamin D, Ergocalciferol, (DRISDOL) 1.25 MG (50000 UNIT) CAPS capsule Take 50,000 Units by mouth every 14 (fourteen) days. 01/07/21    [provider]    Physical Exam: Vitals:   10/01/21 1445 10/01/21 1500 10/01/21 1515 10/01/21 1525  BP:    (!) 173/82  Pulse: 73 67 70 76  Resp: 18 19 11 13   Temp:      TempSrc:      SpO2: 99% 98% 99% 100%  Weight:      Height:        Constitutional: NAD, calm, comfortable Vitals:   10/01/21 1445 10/01/21 1500 10/01/21 1515 10/01/21 1525  BP:    (!) 173/82  Pulse: 73 67 70 76  Resp: 18 19 11 13   Temp:      TempSrc:      SpO2: 99% 98% 99% 100%  Weight:      Height:       General:  Pleasantly resting in bed, No acute distress. HEENT:  Normocephalic atraumatic.  Sclerae nonicteric,  noninjected.  Extraocular movements intact bilaterally. Neck:  Without mass or deformity.  Trachea is midline. Lungs:  Clear to auscultate bilaterally without rhonchi, wheeze, or rales. Heart:  Regular rate and rhythm.  Without murmurs, rubs, or gallops. Abdomen:  Soft, nontender, nondistended.  Without guarding or rebound. Extremities: Without cyanosis, clubbing, edema, or obvious deformity. Vascular:  Dorsalis pedis and posterior tibial pulses palpable bilaterally. Skin:  Warm and dry, no erythema, no ulcerations.  Labs on Admission: I have personally reviewed following labs and imaging studies  CBC: Recent Labs  Lab 10/01/21 1235  WBC 11.4*  NEUTROABS 9.9*  HGB 14.6  HCT 43.7  MCV 90.3  PLT 578   Basic Metabolic Panel: Recent Labs  Lab 10/01/21 1235  NA 144  K 3.4*  CL 99  CO2 22  GLUCOSE 118*  BUN 32*  CREATININE 1.44*  CALCIUM 9.5   GFR: Estimated Creatinine Clearance: 28.5 mL/min (A) (by C-G formula based on SCr of 1.44 mg/dL (H)). Liver Function Tests: Recent Labs  Lab 10/01/21 1235  AST 36  ALT 24  ALKPHOS 80  BILITOT 1.2  PROT 7.6  ALBUMIN 3.6   Recent Labs  Lab 10/01/21 1235  LIPASE 50   No results for input(s): AMMONIA in the last 168 hours. Coagulation Profile: No results for input(s): INR, PROTIME in the last 168 hours. Cardiac  Enzymes: No results for input(s): CKTOTAL, CKMB, CKMBINDEX, TROPONINI in the last 168 hours. BNP (last 3 results) No results for input(s): PROBNP in the last 8760 hours. HbA1C: No results for input(s): HGBA1C in the last 72 hours. CBG: No results for input(s): GLUCAP in the last 168 hours. Lipid Profile: No results for input(s): CHOL, HDL, LDLCALC, TRIG, CHOLHDL, LDLDIRECT in the last 72 hours. Thyroid Function Tests: No results for input(s): TSH, T4TOTAL, FREET4, T3FREE, THYROIDAB in the last 72 hours. Anemia Panel: No results for input(s): VITAMINB12, FOLATE, FERRITIN, TIBC, IRON, RETICCTPCT in the last 72 hours. Urine analysis:    Component Value Date/Time   COLORURINE YELLOW 03/05/2021 1517   APPEARANCEUR CLEAR 03/05/2021 1517   LABSPEC 1.008 03/05/2021 1517   PHURINE 7.0 03/05/2021 1517   GLUCOSEU NEGATIVE 03/05/2021 1517   HGBUR NEGATIVE 03/05/2021 1517   BILIRUBINUR NEGATIVE 03/05/2021 1517   KETONESUR 5 (A) 03/05/2021 1517   PROTEINUR NEGATIVE 03/05/2021 1517   UROBILINOGEN 1.0 10/20/2015 1124   NITRITE POSITIVE (A) 03/05/2021 1517   LEUKOCYTESUR NEGATIVE 03/05/2021 1517    Radiological Exams on Admission: CT Abdomen Pelvis Wo Contrast  Result Date: 10/01/2021 CLINICAL DATA:  Abdominal distension with nausea, vomiting and diarrhea for 3 weeks. Decreased appetite. EXAM: CT ABDOMEN AND PELVIS WITHOUT CONTRAST TECHNIQUE: Multidetector CT imaging of the abdomen and pelvis was performed following the standard protocol without IV contrast. COMPARISON:  Abdominal CT 05/16/2007.  Pelvic CT 10/19/2015. FINDINGS: Lower chest: Mild chronic scarring at both lung bases. No significant pleural or pericardial effusion. Hepatobiliary: The liver appears unremarkable as imaged in the noncontrast state. No evidence of gallstones, gallbladder wall thickening or biliary dilatation. Pancreas: Unremarkable. No pancreatic ductal dilatation or surrounding inflammatory changes. Spleen: Normal in size  without focal abnormality. Stable small splenule. Adrenals/Urinary Tract: The adrenal glands appear stable without significant findings. Progressive bilateral renal cortical scarring and thinning. A water density cyst in the interpolar region of the left kidney has enlarged, measuring up to 3.9 cm on image 30/2. No evidence of urinary tract calculus or hydronephrosis. The bladder appears unremarkable. Stomach/Bowel: No enteric contrast administered. The stomach appears unremarkable  for its degree of distension. No evidence of bowel wall thickening, distention or surrounding inflammatory change. The appendix is not clearly visualized and may be surgically absent. The colon is decompressed without wall thickening or surrounding inflammation. There is mild intramural fat deposition throughout the colon, especially proximally. There are prominent diverticular changes within the descending and sigmoid colon. Vascular/Lymphatic: There are no enlarged abdominal or pelvic lymph nodes. Aortic and branch vessel atherosclerosis without evidence of aneurysm. Reproductive: Hysterectomy.  No adnexal mass. Other: No evidence of abdominal wall mass or hernia. No ascites. Musculoskeletal: There is a mild biconcave compression deformity at T11 which appears new from a chest CT performed 03/01/2021. No associated osseous retropulsion or significant paraspinal hemorrhage. Mild lumbar spondylosis. IMPRESSION: 1. Age indeterminate mild T11 compression fracture, new from chest CT 03/01/2021. 2. No acute abdominal findings or explanation for the patient's symptoms. No evidence of bowel obstruction. There is distal colonic diverticulosis without evidence of acute inflammation. 3. Progressive bilateral renal cortical scarring and thinning without hydronephrosis. 4.  Aortic Atherosclerosis (ICD10-I70.0). Electronically Signed   By: Richardean Sale M.D.   On: 10/01/2021 15:09   DG Chest Port 1 View  Result Date: 10/01/2021 CLINICAL DATA:   Shortness of breath. EXAM: PORTABLE CHEST 1 VIEW COMPARISON:  Radiograph 03/01/2021 and 10/15/2015.  CT 03/01/2021. FINDINGS: 1442 hours. The heart size and mediastinal contours are normal. The lungs are clear. There is no pleural effusion or pneumothorax. No acute osseous findings are identified. Loop recorder noted in the lower left chest. Telemetry leads overlie the chest. IMPRESSION: Stable chest.  No active cardiopulmonary process. Electronically Signed   By: Richardean Sale M.D.   On: 10/01/2021 15:11    EKG: Independently reviewed.  Little Ishikawa DO  [facility] For contact please use secure messenger on Epic  If 7PM-7AM, please contact night-coverage located on [email or web address]   10/01/2021, 4:35 PM

## 2021-10-01 NOTE — ED Provider Notes (Signed)
[facility] DEPT Provider Note   CSN: [id number] Arrival date & time: 10/01/21  1222     History No chief complaint on file.   Nicole Ruiz is a 80 y.o. female.  80 year old female who presents with nausea vomiting diarrhea x3 weeks.  Patient states that she has had some lower abdominal pain for few days.  Denies any fever or chills.  Diarrhea has been watery.  Her emesis has been nonbilious or bloody.  Patient and husband are both very poor historians.  They have not sought care for these issues.  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   Femur fracture, right ([place]) 10/20/2015   Fracture of distal femur, right, closed, with routine healing, subsequent encounter 10/20/2015   Fracture of distal end of femur ([place]) 10/20/2015   Fall 10/19/2015   Dyslipidemia 10/19/2015   Nodule of right lung 10/19/2015   Lung mass 10/19/2015   CVA (cerebral infarction) 10/15/2015   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   Hyperlipidemia 10/15/2015   Stroke (cerebrum) ([place]) 10/15/2015   Cerebral infarction ([place]) 10/15/2015   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[place]) 10/15/2015   Cerebrovascular accident (CVA) ([place]) 10/15/2015   Episode of syncope 07/17/2015   Arthralgia of hip 07/09/2015   Idiopathic insomnia 07/09/2015   Arthralgia of lower leg 04/06/2014   Unspecified arthropathy, lower leg 12/18/2013   Acute posthemorrhagic anemia 12/18/2013   Esophageal reflux 12/18/2013    Acid reflux 12/18/2013   Abnormal finding on radiology exam 09/19/2013   Acquired cyst of kidney 09/19/2013   CKD (chronic kidney disease), stage III ([place]) 09/19/2013   Clinical depression 09/19/2013   Bone/cartilage disorder 09/19/2013   Arthritis of knee, degenerative 09/19/2013   Avitaminosis D 09/19/2013   Hypertension 05/29/2012   Closed fracture of metatarsal bone 12/18/2011   HYPERLIPIDEMIA 03/06/2009   DIZZINESS 03/06/2009   Palpitations 03/06/2009   CHEST PAIN 03/06/2009    Past Surgical History:  Procedure Laterality Date   ABDOMINAL HYSTERECTOMY     ARM SURGERY     CARDIAC CATHETERIZATION  2013   negative   EP IMPLANTABLE DEVICE N/A 10/17/2015   Procedure: Loop Recorder Insertion;  Surgeon: Will Meredith Leeds, MD;  Location: [facility];  Service: Cardiovascular;  Laterality: N/A;   EYE SURGERY     FEMUR IM NAIL Right 10/20/2015   Procedure: INTRAMEDULLARY (IM) RETROGRADE FEMORAL NAILING;  Surgeon: Tania Ade, MD;  Location: [facility];  Service: Orthopedics;  Laterality: Right;   HEMORROIDECTOMY     KNEE SURGERY     TEE WITHOUT CARDIOVERSION N/A 10/17/2015   Procedure: TRANSESOPHAGEAL ECHOCARDIOGRAM (TEE);  Surgeon: Jerline Pain, MD;  Location: [facility] ENDOSCOPY;  Service: Cardiovascular;  Laterality: N/A;     OB History   No obstetric history on file.     Family History  Problem Relation Age of Onset   Heart disease Mother 63   Stroke Father    Heart disease Brother  50    Social History   Tobacco Use   Smoking status: Never   Smokeless tobacco: Never  Substance Use Topics   Alcohol use: No   Drug use: No    Home Medications Prior to Admission medications   Medication Sig Start Date End Date Taking? Authorizing Provider  ALPRAZolam (XANAX XR) 0.5 MG 24 hr tablet Take 1 tablet (0.5 mg total) by mouth daily. 08/17/18   Frann Rider, NP  apixaban (ELIQUIS) 5 MG TABS tablet TAKE 1 TABLET (5 MG TOTAL) BY MOUTH TWO TIMES DAILY. 03/06/21 03/06/22   Nita Sells, MD  atorvastatin (LIPITOR) 20 MG tablet Take 20 mg by mouth daily. 08/16/18   [provider]  buPROPion (WELLBUTRIN XL) 150 MG 24 hr tablet Take by mouth. 06/23/18   [provider]  cephALEXin (KEFLEX) 500 MG capsule Take 1 capsule (500 mg total) by mouth every 12 (twelve) hours. 03/06/21   Nita Sells, MD  cephALEXin (KEFLEX) 500 MG capsule TAKE 1 CAPSULE (500 MG TOTAL) BY MOUTH EVERY TWELVE HOURS. 03/06/21 03/06/22  Nita Sells, MD  Cholecalciferol (VITAMIN D3) 1000 units CAPS Take by mouth.    [provider]  Cyanocobalamin (VITAMIN B 12 PO) Take 1,000 mg by mouth.    [provider]  donepezil (ARICEPT) 10 MG tablet Take 10 mg by mouth daily. 12/24/20   [provider]  FLUoxetine (PROZAC) 40 MG capsule Take 1 capsule (40 mg total) by mouth daily. 05/17/18 05/17/19  Frann Rider, NP  levothyroxine (SYNTHROID, LEVOTHROID) 50 MCG tablet  07/27/18   [provider]  memantine (NAMENDA) 10 MG tablet Take 10 mg by mouth 2 (two) times daily. 12/11/20   [provider]  Omega-3 Fatty Acids (FISH OIL) 1000 MG CAPS Take by mouth.    [provider]  omeprazole (PRILOSEC OTC) 20 MG tablet Take 20 mg by mouth daily.    [provider]  polyvinyl alcohol (LIQUIFILM TEARS) 1.4 % ophthalmic solution Place 1 drop into both eyes every hour as needed for dry eyes.    [provider]  primidone (MYSOLINE) 50 MG tablet Take 50 mg by mouth 2 (two) times daily. Take 2 tablets in the morning and 1 tablet in the evening.    [provider]  propranolol (INDERAL) 10 MG tablet Take 1 tablet (10 mg total) by mouth 2 (two) times daily. 03/06/21   Nita Sells, MD  UNABLE TO FIND Mega red take one daily    [provider]  Vitamin D, Ergocalciferol, (DRISDOL) 1.25 MG (50000 UNIT) CAPS capsule Take 50,000 Units by mouth every 14 (fourteen) days. 01/07/21   [provider]    Allergies    Patient has no known allergies.  Review of Systems   Review of Systems  All other systems reviewed and are negative.  Physical Exam Updated Vital Signs BP (!) 155/88   Pulse 66   Temp 97.7 F (36.5 C) (Oral)   Resp (!) 24   Ht 1.6 m (5\' 3" )   Wt 66.4 kg   SpO2 99%   BMI 25.93 kg/m   Physical Exam Vitals and nursing note reviewed.  Constitutional:      General: She is not in acute distress.    Appearance: Normal appearance. She is well-developed. She is not toxic-appearing.  HENT:     Head: Normocephalic and atraumatic.  Eyes:     General: Lids are normal.     Conjunctiva/sclera: Conjunctivae normal.  Pupils: Pupils are equal, round, and reactive to light.  Neck:     Thyroid: No thyroid mass.     Trachea: No tracheal deviation.  Cardiovascular:     Rate and Rhythm: Normal rate and regular rhythm.     Heart sounds: Normal heart sounds. No murmur heard.   No gallop.  Pulmonary:     Effort: Pulmonary effort is normal. No respiratory distress.     Breath sounds: Normal breath sounds. No stridor. No decreased breath sounds, wheezing, rhonchi or rales.  Abdominal:     General: There is no distension.     Palpations: Abdomen is soft.     Tenderness: There is abdominal tenderness in the right lower quadrant. There is no rebound.  Musculoskeletal:        General: No tenderness. Normal range of motion.     Cervical back: Normal range of motion and neck supple.  Skin:    General: Skin is warm and dry.     Findings: No abrasion or rash.  Neurological:     Mental Status: She is alert and oriented to person, place, and time. Mental status is at baseline. She is confused.     GCS: GCS eye subscore is 4. GCS verbal subscore is 5. GCS motor subscore is 6.     Cranial Nerves: Cranial nerves are intact. No cranial nerve deficit.     Sensory: No sensory deficit.     Motor: Motor function is intact.  Psychiatric:        Attention and  Perception: She is inattentive.        Mood and Affect: Affect is flat.        Speech: Speech is delayed.        Behavior: Behavior normal.    ED Results / Procedures / Treatments   Labs (all labs ordered are listed, but only abnormal results are displayed) Labs Reviewed  CBC WITH DIFFERENTIAL/PLATELET - Abnormal; Notable for the following components:      Result Value   WBC 11.4 (*)    Neutro Abs 9.9 (*)    Abs Immature Granulocytes 0.08 (*)    All other components within normal limits  RESP PANEL BY RT-PCR (FLU A&B, COVID) [id number]  COMPREHENSIVE METABOLIC PANEL  LIPASE, BLOOD  URINALYSIS, ROUTINE W REFLEX MICROSCOPIC    EKG None  Radiology No results found.  Procedures Procedures   Medications Ordered in ED Medications  lactated ringers bolus 1,000 mL (has no administration in time range)  lactated ringers infusion (has no administration in time range)    ED Course  I have reviewed the triage vital signs and the nursing notes.  Pertinent labs & imaging results that were available during my care of the patient were reviewed by me and considered in my medical decision making (see chart for details).    MDM Rules/Calculators/A&P                           Patient continues to complain of diffuse weakness at this time.  Had noted some abdominal discomfort but her abdominal CT did not show any acute intra-abdominal findings.  Renal function is slightly worse than prior.  Clinically she does look dehydrated.  Given IV fluids here and will consult hospitalist for admission Final Clinical Impression(s) / ED Diagnoses Final diagnoses:  None    Rx / DC Orders ED Discharge Orders     None  Lacretia Leigh, MD 10/01/21 1606

## 2021-10-02 DIAGNOSIS — E872 Acidosis, unspecified: Secondary | ICD-10-CM | POA: Diagnosis present

## 2021-10-02 DIAGNOSIS — Z515 Encounter for palliative care: Secondary | ICD-10-CM | POA: Diagnosis not present

## 2021-10-02 DIAGNOSIS — D509 Iron deficiency anemia, unspecified: Secondary | ICD-10-CM | POA: Diagnosis present

## 2021-10-02 DIAGNOSIS — I129 Hypertensive chronic kidney disease with stage 1 through stage 4 chronic kidney disease, or unspecified chronic kidney disease: Secondary | ICD-10-CM | POA: Diagnosis present

## 2021-10-02 DIAGNOSIS — K259 Gastric ulcer, unspecified as acute or chronic, without hemorrhage or perforation: Secondary | ICD-10-CM | POA: Diagnosis present

## 2021-10-02 DIAGNOSIS — I809 Phlebitis and thrombophlebitis of unspecified site: Secondary | ICD-10-CM | POA: Diagnosis not present

## 2021-10-02 DIAGNOSIS — D529 Folate deficiency anemia, unspecified: Secondary | ICD-10-CM | POA: Diagnosis not present

## 2021-10-02 DIAGNOSIS — W19XXXA Unspecified fall, initial encounter: Secondary | ICD-10-CM | POA: Diagnosis present

## 2021-10-02 DIAGNOSIS — R1310 Dysphagia, unspecified: Secondary | ICD-10-CM | POA: Diagnosis not present

## 2021-10-02 DIAGNOSIS — E538 Deficiency of other specified B group vitamins: Secondary | ICD-10-CM | POA: Diagnosis present

## 2021-10-02 DIAGNOSIS — B965 Pseudomonas (aeruginosa) (mallei) (pseudomallei) as the cause of diseases classified elsewhere: Secondary | ICD-10-CM | POA: Diagnosis present

## 2021-10-02 DIAGNOSIS — K253 Acute gastric ulcer without hemorrhage or perforation: Secondary | ICD-10-CM | POA: Diagnosis not present

## 2021-10-02 DIAGNOSIS — E876 Hypokalemia: Secondary | ICD-10-CM | POA: Diagnosis not present

## 2021-10-02 DIAGNOSIS — N390 Urinary tract infection, site not specified: Secondary | ICD-10-CM | POA: Diagnosis present

## 2021-10-02 DIAGNOSIS — I700 Atherosclerosis of aorta: Secondary | ICD-10-CM | POA: Diagnosis present

## 2021-10-02 DIAGNOSIS — Y92009 Unspecified place in unspecified non-institutional (private) residence as the place of occurrence of the external cause: Secondary | ICD-10-CM | POA: Diagnosis not present

## 2021-10-02 DIAGNOSIS — Z7189 Other specified counseling: Secondary | ICD-10-CM | POA: Diagnosis not present

## 2021-10-02 DIAGNOSIS — G928 Other toxic encephalopathy: Secondary | ICD-10-CM | POA: Diagnosis present

## 2021-10-02 DIAGNOSIS — K222 Esophageal obstruction: Secondary | ICD-10-CM | POA: Diagnosis present

## 2021-10-02 DIAGNOSIS — E039 Hypothyroidism, unspecified: Secondary | ICD-10-CM | POA: Diagnosis present

## 2021-10-02 DIAGNOSIS — E46 Unspecified protein-calorie malnutrition: Secondary | ICD-10-CM | POA: Diagnosis present

## 2021-10-02 DIAGNOSIS — E860 Dehydration: Secondary | ICD-10-CM | POA: Diagnosis present

## 2021-10-02 DIAGNOSIS — E785 Hyperlipidemia, unspecified: Secondary | ICD-10-CM | POA: Diagnosis present

## 2021-10-02 DIAGNOSIS — K219 Gastro-esophageal reflux disease without esophagitis: Secondary | ICD-10-CM | POA: Diagnosis present

## 2021-10-02 DIAGNOSIS — R1312 Dysphagia, oropharyngeal phase: Secondary | ICD-10-CM | POA: Diagnosis not present

## 2021-10-02 DIAGNOSIS — N179 Acute kidney failure, unspecified: Secondary | ICD-10-CM | POA: Diagnosis present

## 2021-10-02 DIAGNOSIS — R112 Nausea with vomiting, unspecified: Secondary | ICD-10-CM | POA: Diagnosis not present

## 2021-10-02 DIAGNOSIS — G9341 Metabolic encephalopathy: Secondary | ICD-10-CM | POA: Diagnosis present

## 2021-10-02 DIAGNOSIS — R627 Adult failure to thrive: Secondary | ICD-10-CM | POA: Diagnosis not present

## 2021-10-02 DIAGNOSIS — F0393 Unspecified dementia, unspecified severity, with mood disturbance: Secondary | ICD-10-CM | POA: Diagnosis present

## 2021-10-02 DIAGNOSIS — K254 Chronic or unspecified gastric ulcer with hemorrhage: Secondary | ICD-10-CM | POA: Diagnosis present

## 2021-10-02 DIAGNOSIS — D62 Acute posthemorrhagic anemia: Secondary | ICD-10-CM | POA: Diagnosis not present

## 2021-10-02 DIAGNOSIS — Z20822 Contact with and (suspected) exposure to covid-19: Secondary | ICD-10-CM | POA: Diagnosis present

## 2021-10-02 LAB — COMPREHENSIVE METABOLIC PANEL
ALT: 17 U/L (ref 0–44)
AST: 20 U/L (ref 15–41)
Albumin: 2.8 g/dL — ABNORMAL LOW (ref 3.5–5.0)
Alkaline Phosphatase: 60 U/L (ref 38–126)
Anion gap: 17 — ABNORMAL HIGH (ref 5–15)
BUN: 28 mg/dL — ABNORMAL HIGH (ref 8–23)
CO2: 20 mmol/L — ABNORMAL LOW (ref 22–32)
Calcium: 8.2 mg/dL — ABNORMAL LOW (ref 8.9–10.3)
Chloride: 101 mmol/L (ref 98–111)
Creatinine, Ser: 1.01 mg/dL — ABNORMAL HIGH (ref 0.44–1.00)
GFR, Estimated: 56 mL/min — ABNORMAL LOW (ref >60)
Glucose, Bld: 88 mg/dL (ref 70–99)
Potassium: 2.8 mmol/L — ABNORMAL LOW (ref 3.5–5.1)
Sodium: 138 mmol/L (ref 135–145)
Total Bilirubin: 1.7 mg/dL — ABNORMAL HIGH (ref 0.3–1.2)
Total Protein: 5.5 g/dL — ABNORMAL LOW (ref 6.5–8.1)

## 2021-10-02 MED ORDER — POTASSIUM CHLORIDE CRYS ER 20 MEQ PO TBCR
40.0000 meq | EXTENDED_RELEASE_TABLET | ORAL | Status: DC
  Administered 2021-10-02: 40 meq via ORAL
  Filled 2021-10-02: qty 2

## 2021-10-02 MED ORDER — SODIUM CHLORIDE 0.9 % IV SOLN
1.0000 g | Freq: Every day | INTRAVENOUS | Status: AC
  Administered 2021-10-02 – 2021-10-04 (×3): 1 g via INTRAVENOUS
  Filled 2021-10-02 (×4): qty 10

## 2021-10-02 MED ORDER — ONDANSETRON HCL 4 MG PO TABS: 4.0000 mg | ORAL_TABLET | Freq: Four times a day (QID) | ORAL | Status: DC | PRN

## 2021-10-02 MED ORDER — LIP MEDEX EX OINT
TOPICAL_OINTMENT | CUTANEOUS | Status: DC | PRN
  Administered 2021-10-02: 75 via TOPICAL
  Filled 2021-10-02: qty 7

## 2021-10-02 MED ORDER — POTASSIUM CHLORIDE 10 MEQ/100ML IV SOLN
10.0000 meq | INTRAVENOUS | Status: AC
Start: 2021-10-02 — End: 2021-10-02
  Administered 2021-10-02 (×6): 10 meq via INTRAVENOUS
  Filled 2021-10-02 (×6): qty 100

## 2021-10-02 MED ORDER — POTASSIUM CHLORIDE 20 MEQ PO PACK
40.0000 meq | PACK | ORAL | Status: DC
  Filled 2021-10-02: qty 2

## 2021-10-02 MED ORDER — ONDANSETRON HCL 4 MG/2ML IJ SOLN
4.0000 mg | Freq: Four times a day (QID) | INTRAMUSCULAR | Status: DC | PRN
  Administered 2021-10-02 – 2021-10-09 (×11): 4 mg via INTRAVENOUS
  Filled 2021-10-02 (×12): qty 2

## 2021-10-02 NOTE — TOC Initial Note (Signed)
Transition of Care [facility]) - Initial/Assessment Note    Patient Details  Name: Nicole Ruiz MRN: [id number] Date of Birth: Dec 06, 1941  Transition of Care [facility]) CM/SW Contact:    Leeroy Cha, RN Phone Number: 10/02/2021, 8:47 AM  Clinical Narrative:                 80 y.o. female with medical history significant of hypertension, hyperlipidemia, CKD stage II, depression who presents with intractable nausea vomiting and poor p.o. intake with dehydration over the past 3 to 4 days.  Patient denies any sick contacts recent travel or change in dietary habits.  Apparently some 4 days prior to admission she was having some mild nausea symptoms with poor p.o. intake which continued to worsen over the past 4 days now with intractable symptoms and abnormal labs as evaluated in the ED with elevated creatinine from baseline and an anion gap greater than 20.  Hospitalist called to admit.   Review of Systems: As per HPI, otherwise unremarkable denies chest pain headache fevers chills shortness of breath sick contacts recent travel medication changes.    Assessment/Plan Active Problems:   AKI (acute kidney injury) ([place])   Acute kidney injury on CKD 2 Toc plan of care: Return to home with self care. Expected Discharge Plan: Home/Self Care Barriers to Discharge: No Barriers Identified   Patient Goals and CMS Choice Patient states their goals for this hospitalization and ongoing recovery are:: to go home CMS Medicare.gov Compare Post Acute Care list provided to:: Patient Choice offered to / list presented to : Patient  Expected Discharge Plan and Services Expected Discharge Plan: Home/Self Care   Discharge Planning Services: CM Consult   Living arrangements for the past 2 months: Single Family Home                                      Prior Living Arrangements/Services Living arrangements for the past 2 months: Single Family Home Lives with:: Spouse Patient language and need  for interpreter reviewed:: Yes Do you feel safe going back to the place where you live?: Yes            Criminal Activity/Legal Involvement Pertinent to Current Situation/Hospitalization: No - Comment as needed  Activities of Daily Living Home Assistive Devices/Equipment: Environmental consultant (specify type), Cane (specify quad or straight) ADL Screening (condition at time of admission) Patient's cognitive ability adequate to safely complete daily activities?: No Is the patient deaf or have difficulty hearing?: Yes Does the patient have difficulty seeing, even when wearing glasses/contacts?: Yes Does the patient have difficulty concentrating, remembering, or making decisions?: Yes Patient able to express need for assistance with ADLs?: Yes Does the patient have difficulty dressing or bathing?: No Independently performs ADLs?: No Communication: Independent Dressing (OT): Independent Grooming: Independent Feeding: Independent Bathing: Independent Toileting: Needs assistance Is this a change from baseline?: Pre-admission baseline In/Out Bed: Needs assistance Is this a change from baseline?: Pre-admission baseline Walks in Home: Needs assistance Is this a change from baseline?: Pre-admission baseline Does the patient have difficulty walking or climbing stairs?: Yes Weakness of Legs: Both Weakness of Arms/Hands: Both  Permission Sought/Granted                  Emotional Assessment Appearance:: Appears stated age     Orientation: : Oriented to Self, Oriented to Place, Oriented to  Time, Oriented to Situation Alcohol / Substance Use:  Not Applicable Psych Involvement: No (comment)  Admission diagnosis:  Dehydration [E86.0] AKI (acute kidney injury) ([place]) [N17.9] Patient Active Problem List   Diagnosis Date Noted   AKI (acute kidney injury) ([place]) 10/01/2021   Pulmonary embolism, bilateral ([place]) 03/26/2021   Hypothyroid 03/04/2021   Acute respiratory failure ([place]) 03/01/2021   At risk  for falling 12/20/2015   Cerebrovascular accident, 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   Femur fracture, right ([place]) 10/20/2015   Fracture of distal femur, right, closed, with routine healing, subsequent encounter 10/20/2015   Fracture of distal end of femur ([place]) 10/20/2015   Fall 10/19/2015   Dyslipidemia 10/19/2015   Nodule of right lung 10/19/2015   Lung mass 10/19/2015   CVA (cerebral infarction) 10/15/2015   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) 10/15/2015   Episode of syncope 07/17/2015   Arthralgia of hip 07/09/2015   Idiopathic insomnia 07/09/2015   Arthralgia of lower leg 04/06/2014   Unspecified arthropathy, lower leg 12/18/2013   Acute posthemorrhagic anemia 12/18/2013   Esophageal reflux 12/18/2013   Acid reflux 12/18/2013   Abnormal finding on radiology exam 09/19/2013   Acquired cyst of kidney 09/19/2013   CKD (chronic kidney disease), stage III ([place]) 09/19/2013   Clinical depression 09/19/2013   Bone/cartilage disorder 09/19/2013   Arthritis of knee, degenerative 09/19/2013   Avitaminosis D 09/19/2013   Hypertension 05/29/2012   Closed fracture of metatarsal bone 12/18/2011   HYPERLIPIDEMIA 03/06/2009   DIZZINESS 03/06/2009   Palpitations 03/06/2009   CHEST PAIN 03/06/2009   PCP:  Bernerd Limbo, MD Pharmacy:   [facility] (SE), [place] - [address] [place]) [place] [address] Phone: [phone] Fax: [phone]  [facility] [place], [place] [address] Phone: [phone] Fax: [phone]  [id number] - [place], [place] [address] [place] [address] Phone: [phone] Fax:  [phone]  [facility] [address] [place] [address] Phone: [phone] Fax: [phone]     Social Determinants of Health (SDOH) Interventions    Readmission Risk Interventions No flowsheet data found.

## 2021-10-02 NOTE — Progress Notes (Addendum)
PROGRESS NOTE    Nicole Ruiz  IOE:[id number] DOB: 01-27-1941 DOA: 10/01/2021 PCP: Bernerd Limbo, MD   Brief Narrative:  Nicole Ruiz is a 80 y.o. female with medical history significant of hypertension, hyperlipidemia, CKD stage II, depression who presents with intractable nausea vomiting and poor p.o. intake with dehydration over the past 3 to 4 days.  Patient denies any sick contacts recent travel or change in dietary habits.  Apparently some 4 days prior to admission she was having some mild nausea symptoms with poor p.o. intake which continued to worsen over the past 4 days now with intractable symptoms and abnormal labs as evaluated in the ED with elevated creatinine from baseline and an anion gap greater than 20.  Hospitalist called to admit.    Assessment/Plan   UTI, POA -Continue ceftriaxone, continues to have bladder spasm and transient abdominal pain that is fleeting lasting only a few seconds -Continue to follow urine cultures, de-escalate antibiotics as possible  Acute metabolic encephalopathy, POA, resolving Rule out polypharmacy -Per husband patient is more awake today and alert than previously indicating some encephalopathy over the past few days  -Baseline difficult to estimate given both patient and husband's current inability to accurately outline medical history, medication history or general timeline of illness, concern for underlying baseline dementia -UDS positive for barbiturates -patient declines being on anything in this drug class, awaiting med reconciliation with pharmacy  Acute kidney injury on CKD 2, resolving -Creatinine baseline around 1.2, admitted at 1.4 -Continue LR, increase p.o. intake as tolerated currently on full liquid diet   Intractable nausea vomiting, rule out infectious process -Unclear etiology, suspect viral given somewhat abrupt onset without any sick contacts or dietary changes unlikely foodborne illness -Continue supportive care  Zofran and Phenergan IV fluids until p.o. intake improves   Anion gap metabolic acidosis of unclear etiology -Unclear etiology, BUN 18E, salicylate acetaminophen and ethanol levels unremarkable, UDS positive for barbiturates only -Urine shows mild ketosis -Anion gap downtrending with IV fluids, etiology remains unspecified   DVT prophylaxis: Heparin Code Status: Full Family Communication: Husband at bedside  Status is: Inpatient  Dispo: The patient is from: Home              Anticipated d/c is to: To be determined              Anticipated d/c date is: 48 to 72 hours              Patient currently not medically stable for discharge  Consultants:  None  Procedures:  None  Antimicrobials:  Ceftriaxone  Subjective: No acute issues or events overnight, clinically patient appears to be improving although both she and her husband indicates she is far from baseline.  Patient denies nausea vomiting diarrhea constipation headache fevers chills chest pain or shortness of breath.  She indicates ongoing general malaise unspecified symptoms of feeling "unwell"  Objective: Vitals:   10/01/21 2100 10/01/21 2215 10/02/21 0054 10/02/21 0458  BP: (!) 172/93 (!) 166/80 (!) 176/76 (!) 159/72  Pulse: 77 75 71 65  Resp: 16 18 16 16   Temp:  98.5 F (36.9 C) 98.5 F (36.9 C) 98.3 F (36.8 C)  TempSrc:  Oral Oral Oral  SpO2: 97%  96% 98%  Weight:  57.7 kg    Height:        Intake/Output Summary (Last 24 hours) at 10/02/2021 0744 Last data filed at 10/02/2021 0600 Gross per 24 hour  Intake 1047.5 ml  Output --  Net 1047.5 ml   Filed Weights   10/01/21 1231 10/01/21 2215  Weight: 66.4 kg 57.7 kg    Examination:  General:  Pleasantly resting in bed, No acute distress. HEENT:  Normocephalic atraumatic.  Sclerae nonicteric, noninjected.  Extraocular movements intact bilaterally. Neck:  Without mass or deformity.  Trachea is midline. Lungs:  Clear to auscultate bilaterally without  rhonchi, wheeze, or rales. Heart:  Regular rate and rhythm.  Without murmurs, rubs, or gallops. Abdomen:  Soft, nontender, nondistended.  Without guarding or rebound. Extremities: Without cyanosis, clubbing, edema, or obvious deformity. Vascular:  Dorsalis pedis and posterior tibial pulses palpable bilaterally. Skin:  Warm and dry, no erythema, no ulcerations.  Data Reviewed: I have personally reviewed following labs and imaging studies  CBC: Recent Labs  Lab 10/01/21 1235 10/01/21 1734  WBC 11.4* 10.9*  NEUTROABS 9.9*  --   HGB 14.6 12.2  HCT 43.7 37.2  MCV 90.3 90.5  PLT 278 902   Basic Metabolic Panel: Recent Labs  Lab 10/01/21 1235 10/01/21 1734 10/02/21 0338  NA 144  --  138  K 3.4*  --  2.8*  CL 99  --  101  CO2 22  --  20*  GLUCOSE 118*  --  88  BUN 32*  --  28*  CREATININE 1.44* 1.14* 1.01*  CALCIUM 9.5  --  8.2*   GFR: Estimated Creatinine Clearance: 36.7 mL/min (A) (by C-G formula based on SCr of 1.01 mg/dL (H)). Liver Function Tests: Recent Labs  Lab 10/01/21 1235 10/02/21 0338  AST 36 20  ALT 24 17  ALKPHOS 80 60  BILITOT 1.2 1.7*  PROT 7.6 5.5*  ALBUMIN 3.6 2.8*   Recent Labs  Lab 10/01/21 1235  LIPASE 50   No results for input(s): AMMONIA in the last 168 hours. Coagulation Profile: No results for input(s): INR, PROTIME in the last 168 hours. Cardiac Enzymes: No results for input(s): CKTOTAL, CKMB, CKMBINDEX, TROPONINI in the last 168 hours. BNP (last 3 results) No results for input(s): PROBNP in the last 8760 hours. HbA1C: No results for input(s): HGBA1C in the last 72 hours. CBG: No results for input(s): GLUCAP in the last 168 hours. Lipid Profile: No results for input(s): CHOL, HDL, LDLCALC, TRIG, CHOLHDL, LDLDIRECT in the last 72 hours. Thyroid Function Tests: No results for input(s): TSH, T4TOTAL, FREET4, T3FREE, THYROIDAB in the last 72 hours. Anemia Panel: No results for input(s): VITAMINB12, FOLATE, FERRITIN, TIBC, IRON,  RETICCTPCT in the last 72 hours. Sepsis Labs: No results for input(s): PROCALCITON, LATICACIDVEN in the last 168 hours.  Recent Results (from the past 240 hour(s))  Resp Panel by RT-PCR (Flu A&B, Covid) Nasopharyngeal Swab     Status: None   Collection Time: 10/01/21  1:43 PM   Specimen: Nasopharyngeal Swab; Nasopharyngeal(NP) swabs in vial transport medium  Result Value Ref Range Status   SARS Coronavirus 2 by RT PCR NEGATIVE NEGATIVE Final    Comment: (NOTE) SARS-CoV-2 target nucleic acids are NOT DETECTED.  The SARS-CoV-2 RNA is generally detectable in upper respiratory specimens during the acute phase of infection. The lowest concentration of SARS-CoV-2 viral copies this assay can detect is 138 copies/mL. A negative result does not preclude SARS-Cov-2 infection and should not be used as the sole basis for treatment or other patient management decisions. A negative result may occur with  improper specimen collection/handling, submission of specimen other than nasopharyngeal swab, presence of viral mutation(s) within the areas targeted by this assay, and inadequate number of viral  copies(<138 copies/mL). A negative result must be combined with clinical observations, patient history, and epidemiological information. The expected result is Negative.  Fact Sheet for Patients:  EntrepreneurPulse.com.au  Fact Sheet for Healthcare Providers:  IncredibleEmployment.be  This test is no t yet approved or cleared by the [place] FDA and  has been authorized for detection and/or diagnosis of SARS-CoV-2 by FDA under an Emergency Use Authorization (EUA). This EUA will remain  in effect (meaning this test can be used) for the duration of the COVID-19 declaration under Section 564(b)(1) of the Act, 21 U.S.C.section 360bbb-3(b)(1), unless the authorization is terminated  or revoked sooner.       Influenza A by PCR NEGATIVE NEGATIVE Final   Influenza  B by PCR NEGATIVE NEGATIVE Final    Comment: (NOTE) The Xpert Xpress SARS-CoV-2/FLU/RSV plus assay is intended as an aid in the diagnosis of influenza from Nasopharyngeal swab specimens and should not be used as a sole basis for treatment. Nasal washings and aspirates are unacceptable for Xpert Xpress SARS-CoV-2/FLU/RSV testing.  Fact Sheet for Patients: EntrepreneurPulse.com.au  Fact Sheet for Healthcare Providers: IncredibleEmployment.be  This test is not yet approved or cleared by the [place] FDA and has been authorized for detection and/or diagnosis of SARS-CoV-2 by FDA under an Emergency Use Authorization (EUA). This EUA will remain in effect (meaning this test can be used) for the duration of the COVID-19 declaration under Section 564(b)(1) of the Act, 21 U.S.C. section 360bbb-3(b)(1), unless the authorization is terminated or revoked.  Performed at [facility], [place] [address], [place], [place] [address]          Radiology Studies: CT Abdomen Pelvis Wo Contrast  Result Date: 10/01/2021 CLINICAL DATA:  Abdominal distension with nausea, vomiting and diarrhea for 3 weeks. Decreased appetite. EXAM: CT ABDOMEN AND PELVIS WITHOUT CONTRAST TECHNIQUE: Multidetector CT imaging of the abdomen and pelvis was performed following the standard protocol without IV contrast. COMPARISON:  Abdominal CT 05/16/2007.  Pelvic CT 10/19/2015. FINDINGS: Lower chest: Mild chronic scarring at both lung bases. No significant pleural or pericardial effusion. Hepatobiliary: The liver appears unremarkable as imaged in the noncontrast state. No evidence of gallstones, gallbladder wall thickening or biliary dilatation. Pancreas: Unremarkable. No pancreatic ductal dilatation or surrounding inflammatory changes. Spleen: Normal in size without focal abnormality. Stable small splenule. Adrenals/Urinary Tract: The adrenal glands appear stable without  significant findings. Progressive bilateral renal cortical scarring and thinning. A water density cyst in the interpolar region of the left kidney has enlarged, measuring up to 3.9 cm on image 30/2. No evidence of urinary tract calculus or hydronephrosis. The bladder appears unremarkable. Stomach/Bowel: No enteric contrast administered. The stomach appears unremarkable for its degree of distension. No evidence of bowel wall thickening, distention or surrounding inflammatory change. The appendix is not clearly visualized and may be surgically absent. The colon is decompressed without wall thickening or surrounding inflammation. There is mild intramural fat deposition throughout the colon, especially proximally. There are prominent diverticular changes within the descending and sigmoid colon. Vascular/Lymphatic: There are no enlarged abdominal or pelvic lymph nodes. Aortic and branch vessel atherosclerosis without evidence of aneurysm. Reproductive: Hysterectomy.  No adnexal mass. Other: No evidence of abdominal wall mass or hernia. No ascites. Musculoskeletal: There is a mild biconcave compression deformity at T11 which appears new from a chest CT performed 03/01/2021. No associated osseous retropulsion or significant paraspinal hemorrhage. Mild lumbar spondylosis. IMPRESSION: 1. Age indeterminate mild T11 compression fracture, new from chest CT 03/01/2021. 2. No  acute abdominal findings or explanation for the patient's symptoms. No evidence of bowel obstruction. There is distal colonic diverticulosis without evidence of acute inflammation. 3. Progressive bilateral renal cortical scarring and thinning without hydronephrosis. 4.  Aortic Atherosclerosis (ICD10-I70.0). Electronically Signed   By: Richardean Sale M.D.   On: 10/01/2021 15:09   CT Head Wo Contrast  Result Date: 10/01/2021 CLINICAL DATA:  Nausea with vomiting and diarrhea for 3 weeks. Decreased appetite. Cerebral hemorrhage suspected. EXAM: CT HEAD  WITHOUT CONTRAST TECHNIQUE: Contiguous axial images were obtained from the base of the skull through the vertex without intravenous contrast. COMPARISON:  CT head 10/19/2015.  MRI 03/24/2018. FINDINGS: Brain: There is no evidence of acute intracranial hemorrhage, mass lesion, brain edema or extra-axial fluid collection. The ventricles and subarachnoid spaces are appropriately sized for age. 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 No evidence of acute cortical based infarct. Vascular: Intracranial vascular calcifications. No hyperdense vessel identified. Skull: Negative for fracture or focal lesion. Sinuses/Orbits: The visualized paranasal sinuses and mastoid air cells are clear. No orbital abnormalities are seen. Other: Chronic posttraumatic deformity of the right mandibular condyle noted. IMPRESSION: 1. Stable head CT without acute intracranial findings. 2. Atrophy and chronic small vessel ischemic changes. Electronically Signed   By: Richardean Sale M.D.   On: 10/01/2021 17:32   DG Chest Port 1 View  Result Date: 10/01/2021 CLINICAL DATA:  Shortness of breath. EXAM: PORTABLE CHEST 1 VIEW COMPARISON:  Radiograph 03/01/2021 and 10/15/2015.  CT 03/01/2021. FINDINGS: 1442 hours. The heart size and mediastinal contours are normal. The lungs are clear. There is no pleural effusion or pneumothorax. No acute osseous findings are identified. Loop recorder noted in the lower left chest. Telemetry leads overlie the chest. IMPRESSION: Stable chest.  No active cardiopulmonary process. Electronically Signed   By: Richardean Sale M.D.   On: 10/01/2021 15:11    Scheduled Meds:  heparin  5,000 Units Subcutaneous Q8H   Continuous Infusions:  lactated ringers 75 mL/hr at 10/01/21 1722     LOS: 0 days   Time spent: 49min  Dolora Ridgely C Ashwin Tibbs, DO [facility]  If 7PM-7AM, please contact  night-coverage [email or web address]  10/02/2021, 7:44 AM

## 2021-10-02 NOTE — Plan of Care (Signed)

## 2021-10-03 DIAGNOSIS — N179 Acute kidney failure, unspecified: Secondary | ICD-10-CM | POA: Diagnosis not present

## 2021-10-03 DIAGNOSIS — N390 Urinary tract infection, site not specified: Secondary | ICD-10-CM | POA: Diagnosis not present

## 2021-10-03 LAB — CBC
HCT: 34.8 % — ABNORMAL LOW (ref 36.0–46.0)
Hemoglobin: 11.5 g/dL — ABNORMAL LOW (ref 12.0–15.0)
MCH: 29.8 pg (ref 26.0–34.0)
MCHC: 33 g/dL (ref 30.0–36.0)
MCV: 90.2 fL (ref 80.0–100.0)
Platelets: 188 10*3/uL (ref 150–400)
RBC: 3.86 MIL/uL — ABNORMAL LOW (ref 3.87–5.11)
RDW: 15.6 % — ABNORMAL HIGH (ref 11.5–15.5)
WBC: 8.5 10*3/uL (ref 4.0–10.5)
nRBC: 0 % (ref 0.0–0.2)

## 2021-10-03 LAB — COMPREHENSIVE METABOLIC PANEL
ALT: 16 U/L (ref 0–44)
AST: 21 U/L (ref 15–41)
Albumin: 2.8 g/dL — ABNORMAL LOW (ref 3.5–5.0)
Alkaline Phosphatase: 56 U/L (ref 38–126)
Anion gap: 16 — ABNORMAL HIGH (ref 5–15)
BUN: 19 mg/dL (ref 8–23)
CO2: 16 mmol/L — ABNORMAL LOW (ref 22–32)
Calcium: 8.2 mg/dL — ABNORMAL LOW (ref 8.9–10.3)
Chloride: 102 mmol/L (ref 98–111)
Creatinine, Ser: 1.01 mg/dL — ABNORMAL HIGH (ref 0.44–1.00)
GFR, Estimated: 56 mL/min — ABNORMAL LOW (ref >60)
Glucose, Bld: 93 mg/dL (ref 70–99)
Potassium: 5.2 mmol/L — ABNORMAL HIGH (ref 3.5–5.1)
Sodium: 134 mmol/L — ABNORMAL LOW (ref 135–145)
Total Bilirubin: 1.5 mg/dL — ABNORMAL HIGH (ref 0.3–1.2)
Total Protein: 5.6 g/dL — ABNORMAL LOW (ref 6.5–8.1)

## 2021-10-03 MED ORDER — LEVOTHYROXINE SODIUM 50 MCG PO TABS
50.0000 ug | ORAL_TABLET | Freq: Every day | ORAL | Status: DC
  Administered 2021-10-06 – 2021-10-10 (×4): 50 ug via ORAL
  Filled 2021-10-03 (×5): qty 1

## 2021-10-03 MED ORDER — MEMANTINE HCL 10 MG PO TABS
10.0000 mg | ORAL_TABLET | Freq: Two times a day (BID) | ORAL | Status: DC
  Administered 2021-10-05 – 2021-10-15 (×20): 10 mg via ORAL
  Filled 2021-10-03 (×24): qty 1

## 2021-10-03 MED ORDER — ADULT MULTIVITAMIN W/MINERALS CH
1.0000 | ORAL_TABLET | Freq: Every day | ORAL | Status: DC
  Administered 2021-10-06 – 2021-10-15 (×8): 1 via ORAL
  Filled 2021-10-03 (×11): qty 1

## 2021-10-03 MED ORDER — ENSURE ENLIVE PO LIQD
237.0000 mL | Freq: Three times a day (TID) | ORAL | Status: DC
  Administered 2021-10-10 – 2021-10-15 (×12): 237 mL via ORAL

## 2021-10-03 MED ORDER — BUPROPION HCL ER (XL) 150 MG PO TB24
150.0000 mg | ORAL_TABLET | Freq: Every day | ORAL | Status: DC
  Administered 2021-10-06 – 2021-10-15 (×7): 150 mg via ORAL
  Filled 2021-10-03 (×11): qty 1

## 2021-10-03 MED ORDER — SODIUM CHLORIDE 0.9 % IV SOLN
INTRAVENOUS | Status: DC | PRN
Start: 2021-10-03 — End: 2021-10-16

## 2021-10-03 NOTE — Progress Notes (Signed)
Initial Nutrition Assessment  DOCUMENTATION CODES:  Not applicable  INTERVENTION:  Continue to advance diet as medically able and as tolerated.  Add Ensure Plus High Protein po TID, each supplement provides 350 kcal and 20 grams of protein.   Add Magic cup TID with meals, each supplement provides 290 kcal and 9 grams of protein.  Add MVI with minerals daily.  Encourage PO intake.  NUTRITION DIAGNOSIS:  Inadequate oral intake related to nausea as evidenced by per patient/family report.  GOAL:  Patient will meet greater than or equal to 90% of their needs  MONITOR:  Diet advancement, PO intake, Supplement acceptance, Labs, Weight trends, I & O's  REASON FOR ASSESSMENT:  Malnutrition Screening Tool    ASSESSMENT:  80 yo female with a PMH of HTN, HLD, CKD stage 2, and depression who presents with intractable nausea vomiting and poor p.o. intake with dehydration over the past 3 to 4 days. Admitted with UTI, acute metabolic encephalopathy, and AKI  Spoke with pt at bedside. Pt very nauseated during visit. Reports that she does not want to eat or drink anything at current. Pt with 0% intake documented over last 3 meals.  Pt reports no weight loss. However, per Epic, pt has lost 19 lbs (13%) in the past 3.5 months, which is significant and severe for the time frame. Noted to have mild BLE edema.  RD suspects patient is malnourished to some degree, however cannot officially diagnose at this time.  Recommend adding Ensure TID, Magic Cup TID, and MVI with minerals daily.  Medications: reviewed; LR @ 75 ml/hr, Zofran PRN (given twice today)  Labs: reviewed; Na 134 (L), K 5.2 (H)  NUTRITION - FOCUSED PHYSICAL EXAM: Deferred due to extreme nausea  Diet Order:   Diet Order             Diet full liquid Room service appropriate? Yes; Fluid consistency: Thin  Diet effective now                  EDUCATION NEEDS:  Education needs have been addressed  Skin:  Skin Assessment:  Reviewed RN Assessment  Last BM:  10/02/21 - Type 4, medium  Height:  Ht Readings from Last 1 Encounters:  10/01/21 5\' 3"  (1.6 m)   Weight:  Wt Readings from Last 1 Encounters:  10/01/21 57.7 kg   BMI:  Body mass index is 22.53 kg/m.  Estimated Nutritional Needs:  Kcal:  2482-5003 Protein:  70-85 grams Fluid:  >1.8 L  Derrel Nip, RD, LDN (she/her/hers) Registered Dietitian I After-Hours/Weekend Pager # in [place]

## 2021-10-03 NOTE — Progress Notes (Signed)
PROGRESS NOTE    Nicole Ruiz  TDD:[id number] DOB: November 15, 1941 DOA: 10/01/2021 PCP: Bernerd Limbo, MD   Brief Narrative:  Nicole Ruiz is a 80 y.o. female with medical history significant of hypertension, hyperlipidemia, CKD stage II, depression who presents with intractable nausea vomiting and poor p.o. intake with dehydration over the past 3 to 4 days.  Patient denies any sick contacts recent travel or change in dietary habits.  Apparently some 4 days prior to admission she was having some mild nausea symptoms with poor p.o. intake which continued to worsen over the past 4 days now with intractable symptoms and abnormal labs as evaluated in the ED with elevated creatinine from baseline and an anion gap greater than 20.  Hospitalist called to admit.    Assessment/Plan   UTI, POA, improving -Continue ceftriaxone, continues to have bladder spasm and transient abdominal pain that is fleeting lasting only a few seconds -Continue to follow urine cultures, de-escalate antibiotics as possible  Acute metabolic encephalopathy, POA, resolving Rule out polypharmacy -Patient's mental status appears to be approaching baseline -Baseline dementia suspected, unclear diagnosis -UDS positive for barbiturates -patient declines being on anything in this drug class, current home med list we have access to (again unclear if this is accurate) does not contain barbs...unclear how she is positive for this medication  Mild acute kidney injury on CKD 2, resolved -Creatinine back to baseline -Continue LR until PO intake improves  Intractable nausea vomiting, unable to rule out infectious process Dysphagia, ongoing -Unclear etiology, suspect viral given somewhat abrupt onset without any sick contacts or dietary changes unlikely foodborne illness -Continue supportive care Zofran and Phenergan IV fluids until p.o. intake improves -Speech evaluation somewhat limited due to patient noncompliance with testing,  refusing to take more than sips at any given time which limits barium swallow evaluation   Anion gap metabolic acidosis of unclear etiology, resolving -Unclear etiology, BUN 62B at intake, salicylate acetaminophen and ethanol levels unremarkable, UDS positive for barbiturates only -Urine shows mild ketosis -Anion gap downtrending with IV fluids, etiology remains unspecified -potentially lactic in origin but labs not obtained at intake   DVT prophylaxis: Heparin Code Status: Full Family Communication: Husband at bedside  Status is: Inpatient  Dispo: The patient is from: Home              Anticipated d/c is to: To be determined              Anticipated d/c date is: 48 to 72 hours              Patient currently not medically stable for discharge  Consultants:  None  Procedures:  None  Antimicrobials:  Ceftriaxone  Subjective: No acute issues or events overnight, patient continues to feel "unwell" and husband states patient seems the same at intake clinically she does appear improving, labs improving as above she denies headache fevers chills chest pain or shortness of breath but remarks on ongoing nausea, occasional vomiting and late today mentioned difficulty swallowing to nurse, speech evaluation pending as above  Objective: Vitals:   10/02/21 0800 10/02/21 1322 10/02/21 2100 10/03/21 0612  BP: (!) 150/67 (!) 160/79 (!) 170/60 (!) 158/67  Pulse: 67 66 71 65  Resp: 16 15 17 17   Temp: 98.2 F (36.8 C) 97.8 F (36.6 C) 98 F (36.7 C) 98.8 F (37.1 C)  TempSrc: Oral Oral Oral   SpO2: 98% 98% 100% 97%  Weight:      Height:  Intake/Output Summary (Last 24 hours) at 10/03/2021 0747 Last data filed at 10/03/2021 0612 Gross per 24 hour  Intake 2663.61 ml  Output 100 ml  Net 2563.61 ml    Filed Weights   10/01/21 1231 10/01/21 2215  Weight: 66.4 kg 57.7 kg    Examination:  General:  Pleasantly resting in bed, No acute distress. HEENT:  Normocephalic atraumatic.   Sclerae nonicteric, noninjected.  Extraocular movements intact bilaterally. Neck:  Without mass or deformity.  Trachea is midline. Lungs:  Clear to auscultate bilaterally without rhonchi, wheeze, or rales. Heart:  Regular rate and rhythm.  Without murmurs, rubs, or gallops. Abdomen:  Soft, nontender, nondistended.  Without guarding or rebound. Extremities: Without cyanosis, clubbing, edema, or obvious deformity. Vascular:  Dorsalis pedis and posterior tibial pulses palpable bilaterally. Skin:  Warm and dry, no erythema, no ulcerations.  Data Reviewed: I have personally reviewed following labs and imaging studies  CBC: Recent Labs  Lab 10/01/21 1235 10/01/21 1734 10/03/21 0342  WBC 11.4* 10.9* 8.5  NEUTROABS 9.9*  --   --   HGB 14.6 12.2 11.5*  HCT 43.7 37.2 34.8*  MCV 90.3 90.5 90.2  PLT 278 187 784    Basic Metabolic Panel: Recent Labs  Lab 10/01/21 1235 10/01/21 1734 10/02/21 0338 10/03/21 0342  NA 144  --  138 134*  K 3.4*  --  2.8* 5.2*  CL 99  --  101 102  CO2 22  --  20* 16*  GLUCOSE 118*  --  88 93  BUN 32*  --  28* 19  CREATININE 1.44* 1.14* 1.01* 1.01*  CALCIUM 9.5  --  8.2* 8.2*    GFR: Estimated Creatinine Clearance: 36.7 mL/min (A) (by C-G formula based on SCr of 1.01 mg/dL (H)). Liver Function Tests: Recent Labs  Lab 10/01/21 1235 10/02/21 0338 10/03/21 0342  AST 36 20 21  ALT 24 17 16   ALKPHOS 80 60 56  BILITOT 1.2 1.7* 1.5*  PROT 7.6 5.5* 5.6*  ALBUMIN 3.6 2.8* 2.8*    Recent Labs  Lab 10/01/21 1235  LIPASE 50    No results for input(s): AMMONIA in the last 168 hours. Coagulation Profile: No results for input(s): INR, PROTIME in the last 168 hours. Cardiac Enzymes: No results for input(s): CKTOTAL, CKMB, CKMBINDEX, TROPONINI in the last 168 hours. BNP (last 3 results) No results for input(s): PROBNP in the last 8760 hours. HbA1C: No results for input(s): HGBA1C in the last 72 hours. CBG: No results for input(s): GLUCAP in the  last 168 hours. Lipid Profile: No results for input(s): CHOL, HDL, LDLCALC, TRIG, CHOLHDL, LDLDIRECT in the last 72 hours. Thyroid Function Tests: No results for input(s): TSH, T4TOTAL, FREET4, T3FREE, THYROIDAB in the last 72 hours. Anemia Panel: No results for input(s): VITAMINB12, FOLATE, FERRITIN, TIBC, IRON, RETICCTPCT in the last 72 hours. Sepsis Labs: No results for input(s): PROCALCITON, LATICACIDVEN in the last 168 hours.  Recent Results (from the past 240 hour(s))  Resp Panel by RT-PCR (Flu A&B, Covid) Nasopharyngeal Swab     Status: None   Collection Time: 10/01/21  1:43 PM   Specimen: Nasopharyngeal Swab; Nasopharyngeal(NP) swabs in vial transport medium  Result Value Ref Range Status   SARS Coronavirus 2 by RT PCR NEGATIVE NEGATIVE Final    Comment: (NOTE) SARS-CoV-2 target nucleic acids are NOT DETECTED.  The SARS-CoV-2 RNA is generally detectable in upper respiratory specimens during the acute phase of infection. The lowest concentration of SARS-CoV-2 viral copies this assay can detect  is 138 copies/mL. A negative result does not preclude SARS-Cov-2 infection and should not be used as the sole basis for treatment or other patient management decisions. A negative result may occur with  improper specimen collection/handling, submission of specimen other than nasopharyngeal swab, presence of viral mutation(s) within the areas targeted by this assay, and inadequate number of viral copies(<138 copies/mL). A negative result must be combined with clinical observations, patient history, and epidemiological information. The expected result is Negative.  Fact Sheet for Patients:  EntrepreneurPulse.com.au  Fact Sheet for Healthcare Providers:  IncredibleEmployment.be  This test is no t yet approved or cleared by the [place] FDA and  has been authorized for detection and/or diagnosis of SARS-CoV-2 by FDA under an Emergency Use  Authorization (EUA). This EUA will remain  in effect (meaning this test can be used) for the duration of the COVID-19 declaration under Section 564(b)(1) of the Act, 21 U.S.C.section 360bbb-3(b)(1), unless the authorization is terminated  or revoked sooner.       Influenza A by PCR NEGATIVE NEGATIVE Final   Influenza B by PCR NEGATIVE NEGATIVE Final    Comment: (NOTE) The Xpert Xpress SARS-CoV-2/FLU/RSV plus assay is intended as an aid in the diagnosis of influenza from Nasopharyngeal swab specimens and should not be used as a sole basis for treatment. Nasal washings and aspirates are unacceptable for Xpert Xpress SARS-CoV-2/FLU/RSV testing.  Fact Sheet for Patients: EntrepreneurPulse.com.au  Fact Sheet for Healthcare Providers: IncredibleEmployment.be  This test is not yet approved or cleared by the [place] FDA and has been authorized for detection and/or diagnosis of SARS-CoV-2 by FDA under an Emergency Use Authorization (EUA). This EUA will remain in effect (meaning this test can be used) for the duration of the COVID-19 declaration under Section 564(b)(1) of the Act, 21 U.S.C. section 360bbb-3(b)(1), unless the authorization is terminated or revoked.  Performed at [facility], [address] [place], [place] [address]           Radiology Studies: CT Abdomen Pelvis Wo Contrast  Result Date: 10/01/2021 CLINICAL DATA:  Abdominal distension with nausea, vomiting and diarrhea for 3 weeks. Decreased appetite. EXAM: CT ABDOMEN AND PELVIS WITHOUT CONTRAST TECHNIQUE: Multidetector CT imaging of the abdomen and pelvis was performed following the standard protocol without IV contrast. COMPARISON:  Abdominal CT 05/16/2007.  Pelvic CT 10/19/2015. FINDINGS: Lower chest: Mild chronic scarring at both lung bases. No significant pleural or pericardial effusion. Hepatobiliary: The liver appears unremarkable as imaged in the  noncontrast state. No evidence of gallstones, gallbladder wall thickening or biliary dilatation. Pancreas: Unremarkable. No pancreatic ductal dilatation or surrounding inflammatory changes. Spleen: Normal in size without focal abnormality. Stable small splenule. Adrenals/Urinary Tract: The adrenal glands appear stable without significant findings. Progressive bilateral renal cortical scarring and thinning. A water density cyst in the interpolar region of the left kidney has enlarged, measuring up to 3.9 cm on image 30/2. No evidence of urinary tract calculus or hydronephrosis. The bladder appears unremarkable. Stomach/Bowel: No enteric contrast administered. The stomach appears unremarkable for its degree of distension. No evidence of bowel wall thickening, distention or surrounding inflammatory change. The appendix is not clearly visualized and may be surgically absent. The colon is decompressed without wall thickening or surrounding inflammation. There is mild intramural fat deposition throughout the colon, especially proximally. There are prominent diverticular changes within the descending and sigmoid colon. Vascular/Lymphatic: There are no enlarged abdominal or pelvic lymph nodes. Aortic and branch vessel atherosclerosis without evidence of aneurysm. Reproductive:  Hysterectomy.  No adnexal mass. Other: No evidence of abdominal wall mass or hernia. No ascites. Musculoskeletal: There is a mild biconcave compression deformity at T11 which appears new from a chest CT performed 03/01/2021. No associated osseous retropulsion or significant paraspinal hemorrhage. Mild lumbar spondylosis. IMPRESSION: 1. Age indeterminate mild T11 compression fracture, new from chest CT 03/01/2021. 2. No acute abdominal findings or explanation for the patient's symptoms. No evidence of bowel obstruction. There is distal colonic diverticulosis without evidence of acute inflammation. 3. Progressive bilateral renal cortical scarring and  thinning without hydronephrosis. 4.  Aortic Atherosclerosis (ICD10-I70.0). Electronically Signed   By: Richardean Sale M.D.   On: 10/01/2021 15:09   CT Head Wo Contrast  Result Date: 10/01/2021 CLINICAL DATA:  Nausea with vomiting and diarrhea for 3 weeks. Decreased appetite. Cerebral hemorrhage suspected. EXAM: CT HEAD WITHOUT CONTRAST TECHNIQUE: Contiguous axial images were obtained from the base of the skull through the vertex without intravenous contrast. COMPARISON:  CT head 10/19/2015.  MRI 03/24/2018. FINDINGS: Brain: There is no evidence of acute intracranial hemorrhage, mass lesion, brain edema or extra-axial fluid collection. The ventricles and subarachnoid spaces are appropriately sized for age. 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 No evidence of acute cortical based infarct. Vascular: Intracranial vascular calcifications. No hyperdense vessel identified. Skull: Negative for fracture or focal lesion. Sinuses/Orbits: The visualized paranasal sinuses and mastoid air cells are clear. No orbital abnormalities are seen. Other: Chronic posttraumatic deformity of the right mandibular condyle noted. IMPRESSION: 1. Stable head CT without acute intracranial findings. 2. Atrophy and chronic small vessel ischemic changes. Electronically Signed   By: Richardean Sale M.D.   On: 10/01/2021 17:32   DG Chest Port 1 View  Result Date: 10/01/2021 CLINICAL DATA:  Shortness of breath. EXAM: PORTABLE CHEST 1 VIEW COMPARISON:  Radiograph 03/01/2021 and 10/15/2015.  CT 03/01/2021. FINDINGS: 1442 hours. The heart size and mediastinal contours are normal. The lungs are clear. There is no pleural effusion or pneumothorax. No acute osseous findings are identified. Loop recorder noted in the lower left chest. Telemetry leads overlie the chest. IMPRESSION: Stable chest.  No active cardiopulmonary process. Electronically Signed   By: Richardean Sale M.D.   On: 10/01/2021 15:11    Scheduled Meds:  heparin  5,000 Units Subcutaneous Q8H   Continuous Infusions:  cefTRIAXone (ROCEPHIN)  IV Stopped (10/02/21 1128)   lactated ringers 75 mL/hr at 10/03/21 0214     LOS: 1 day   Time spent: 12min  Ernest Popowski C Stamatia Masri, DO [facility]  If 7PM-7AM, please contact night-coverage [email or web address]  10/03/2021, 7:47 AM

## 2021-10-03 NOTE — Plan of Care (Signed)
  Problem: Education: Goal: Knowledge of General Education information will improve Description Including pain rating scale, medication(s)/side effects and non-pharmacologic comfort measures Outcome: Progressing   

## 2021-10-03 NOTE — Evaluation (Signed)
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[place])    Chronic kidney disease    Depression    Hyperlipidemia    Hypertension    Stroke [facility])    Past Surgical History:  Past Surgical History:  Procedure Laterality Date   ABDOMINAL HYSTERECTOMY     ARM SURGERY     CARDIAC CATHETERIZATION  2013   negative   EP IMPLANTABLE DEVICE N/A 10/17/2015   Procedure: Loop Recorder Insertion;  Surgeon: Will Meredith Leeds, MD;  Location: [facility];  Service: Cardiovascular;  Laterality: N/A;   EYE SURGERY     FEMUR IM NAIL Right 10/20/2015   Procedure: INTRAMEDULLARY (IM) RETROGRADE FEMORAL NAILING;  Surgeon: Tania Ade, MD;  Location: [facility];  Service: Orthopedics;  Laterality: Right;   HEMORROIDECTOMY     KNEE SURGERY     TEE WITHOUT CARDIOVERSION N/A 10/17/2015   Procedure: TRANSESOPHAGEAL ECHOCARDIOGRAM (TEE);  Surgeon: Jerline Pain, MD;  Location: [facility] ENDOS[facility];  Service: Cardiovascular;  Laterality: N/A;   HPI:  80 y.o. female with medical history significant of hypertension, hyperlipidemia, CKD stage II, CVA, depression who presents with intractable nausea vomiting and poor p.o. intake with dehydration.  Spouse reports he brought her to the hospital because he could not handle getting her to the bathroom anymore.  Per MD notes "Patient denies any sick contacts recent travel or change in dietary habits.   Swallow evaluation ordered due to pt's complaint of dysphagia - nausea/vomiting, globus.  Spouse reports since pt fell backward she has had difficulty swallowing.  He pointed to proximal esophagus to indicate region where pt's pills lodge. States pt consumes only  mtn lion and water to take her pills and has not had a "meal" since her fall.    Assessment / Plan / Recommendation  Clinical Impression  Very limited eval done on Nicole Ruiz due to her decreased participation.  She repeatedly stated "I want to lie down, if you don't let me lie down, I"ll vomit".   Pt with minimal facial asymmetry otherwise generalized weakness noted.  She is wearing her dentures during session and is able to articulate clearly.  Pt willing to consume only a single tsp of gingerale which she self fed.  Obvious effortful and uncomfortable swallow noted followed by pt retching and reporting burning" in her throat. Expectoration of thin secretions x1 noted. No indication of aspiration with single bolus pt accepted.    Pt advises she feels "something in her throat" that "won't go down". Spouse reports pt feeling like pills stick in her esophagus - pointing to proximal esophagus.  She refused to take more than a single tsp - including offerings of water and saltine cracker in attempts to ease nausea.  Pt denies anything being easier to tolerate by mouth.   Spouse reports sudden onset of vomiting/globus following falling backward several weeks ago and states pt was only drinking water and [employer] with medications prior to admit.  He states "she even stopped drinking Mtn Lion" - which increased his concerns.   Based on this very limited evaluation, pt does not clinically present with oropharyngeal dysphagia - Advised pt and spouse to these findings.   Pt  is not a candidate for esophagram or MBS in this SLPs opinion, as she will not consume adequate barium for diagnostic purposes.    SLP will sign off as do not have any to offer for this unfortunate patient. SLP Visit Diagnosis: Dysphagia, unspecified (R13.10)    Aspiration Risk  Risk for inadequate nutrition/hydration    Diet Recommendation  (defer to MD for diet)   Compensations: Slow rate;Small sips/bites Postural Changes: Seated  upright at 90 degrees;Remain upright for at least 30 minutes after po intake    Other  Recommendations Oral Care Recommendations: Oral care BID    Recommendations for follow up therapy are one component of a multi-disciplinary discharge planning process, led by the attending physician.  Recommendations may be updated based on patient status, additional functional criteria and insurance authorization.  Follow up Recommendations None      Frequency and Duration            Prognosis        Swallow Study   General Date of Onset: 10/03/21 HPI: 80 y.o. female with medical history significant of hypertension, hyperlipidemia, CKD stage II, CVA, depression who presents with intractable nausea vomiting and poor p.o. intake with dehydration.  Spouse reports he brought her to the hospital because he could not handle getting her to the bathroom anymore.  Per MD notes "Patient denies any sick contacts recent travel or change in dietary habits.   Swallow evaluation ordered due to pt's complaint of dysphagia - nausea/vomiting, globus.  Spouse reports since pt fell backward she has had difficulty swallowing.  He pointed to proximal esophagus to indicate region where pt's pills lodge. States pt consumes only mtn lion and water to take her pills and has not had a "meal" since her fall. Type of Study: Bedside Swallow Evaluation Diet Prior to this Study: Thin liquids Temperature Spikes Noted: No Respiratory Status: Room air History of Recent Intubation: No Behavior/Cognition: Alert;Other (Comment) (flat affect) Oral Cavity Assessment: Within Functional Limits Oral Care Completed by SLP: No Oral Cavity - Dentition: Dentures, top;Dentures, bottom Vision: Functional for self-feeding Self-Feeding Abilities: Able to feed self Patient Positioning: Upright in bed Baseline Vocal Quality: Low vocal intensity Volitional Cough: Weak Volitional Swallow: Unable to elicit    Oral/Motor/Sensory Function Overall  Oral Motor/Sensory Function: Generalized oral weakness   Ice Chips Ice chips: Not tested   Thin Liquid Thin Liquid: Impaired Presentation: Self Fed;Spoon Pharyngeal  Phase Impairments: Other (comments)  complaint of "burning" in the throat, retching   Nectar Thick Nectar Thick Liquid: Not tested   Honey Thick Honey Thick Liquid: Not tested   Puree Puree: Not tested   Solid     Solid: Not tested      Macario Golds 10/03/2021,3:09 PM  Kathleen Lime, Nicole McGregor Office [phone] Pager [phone]

## 2021-10-04 DIAGNOSIS — N179 Acute kidney failure, unspecified: Secondary | ICD-10-CM | POA: Diagnosis not present

## 2021-10-04 DIAGNOSIS — N390 Urinary tract infection, site not specified: Secondary | ICD-10-CM | POA: Diagnosis not present

## 2021-10-04 LAB — COMPREHENSIVE METABOLIC PANEL
ALT: 19 U/L (ref 0–44)
AST: 24 U/L (ref 15–41)
Albumin: 2.9 g/dL — ABNORMAL LOW (ref 3.5–5.0)
Alkaline Phosphatase: 59 U/L (ref 38–126)
Anion gap: 17 — ABNORMAL HIGH (ref 5–15)
BUN: 15 mg/dL (ref 8–23)
CO2: 17 mmol/L — ABNORMAL LOW (ref 22–32)
Calcium: 8.5 mg/dL — ABNORMAL LOW (ref 8.9–10.3)
Chloride: 103 mmol/L (ref 98–111)
Creatinine, Ser: 0.99 mg/dL (ref 0.44–1.00)
GFR, Estimated: 58 mL/min — ABNORMAL LOW (ref >60)
Glucose, Bld: 84 mg/dL (ref 70–99)
Potassium: 3.7 mmol/L (ref 3.5–5.1)
Sodium: 137 mmol/L (ref 135–145)
Total Bilirubin: 1.4 mg/dL — ABNORMAL HIGH (ref 0.3–1.2)
Total Protein: 5.9 g/dL — ABNORMAL LOW (ref 6.5–8.1)

## 2021-10-04 LAB — CBC
HCT: 35.9 % — ABNORMAL LOW (ref 36.0–46.0)
Hemoglobin: 12 g/dL (ref 12.0–15.0)
MCH: 29.9 pg (ref 26.0–34.0)
MCHC: 33.4 g/dL (ref 30.0–36.0)
MCV: 89.3 fL (ref 80.0–100.0)
Platelets: 190 10*3/uL (ref 150–400)
RBC: 4.02 MIL/uL (ref 3.87–5.11)
RDW: 15.6 % — ABNORMAL HIGH (ref 11.5–15.5)
WBC: 7.8 10*3/uL (ref 4.0–10.5)
nRBC: 0 % (ref 0.0–0.2)

## 2021-10-04 NOTE — Progress Notes (Signed)
SLP Cancellation Note  Patient Details Name: Nicole Ruiz MRN: [id number] DOB: [birth year]   Cancelled treatment:       Reason Eval/Treat Not Completed: Medical issues which prohibited therapy; pt experiencing intractable vomiting; ST will f/u for MBS readiness and/or swallow re-assessment as pt able.   Elvina Sidle, M.S., CCC-SLP 10/04/2021, 5:56 PM

## 2021-10-04 NOTE — Progress Notes (Signed)
PROGRESS NOTE    Nicole Ruiz  FMB:[id number] DOB: 14-May-1941 DOA: 10/01/2021 PCP: Bernerd Limbo, MD   Brief Narrative:  Nicole Ruiz is a 80 y.o. female with medical history significant of hypertension, hyperlipidemia, CKD stage II, depression who presents with intractable nausea vomiting and poor p.o. intake with dehydration over the past 3 to 4 days.  Patient denies any sick contacts recent travel or change in dietary habits.  Apparently some 4 days prior to admission she was having some mild nausea symptoms with poor p.o. intake which continued to worsen over the past 4 days now with intractable symptoms and abnormal labs as evaluated in the ED with elevated creatinine from baseline and an anion gap greater than 20.  Hospitalist called to admit.    Assessment/Plan   UTI, POA, improving -Continue ceftriaxone, continues to have bladder spasm and transient abdominal pain that is fleeting lasting only a few seconds -Continue to follow urine cultures, de-escalate antibiotics as possible  Acute metabolic encephalopathy, POA, resolving Rule out polypharmacy -Patient's mental status appears to be approaching baseline -Baseline dementia suspected, unclear diagnosis -UDS positive for barbiturates -patient declines being on anything in this drug class, current home med list we have access to (again unclear if this is accurate) does not contain barbs...unclear how she is positive for this medication  Mild acute kidney injury on CKD 2, resolved -Creatinine back to baseline -Continue LR until PO intake improves  Intractable nausea vomiting, unable to rule out infectious process Dysphagia, ongoing -Unclear etiology, suspect viral given somewhat abrupt onset without any sick contacts or dietary changes unlikely foodborne illness -Continue supportive care Zofran and Phenergan IV fluids until p.o. intake improves -We will move forward with modified barium swallow, patient willing to attempt  although she notes her p.o. intake is limited due to pain and "blockage" feeling will help that MBS will help evaluate for either diverticulum or stricture.   Anion gap metabolic acidosis of unclear etiology, resolving -Unclear etiology, BUN 70V at intake, salicylate acetaminophen and ethanol levels unremarkable, UDS positive for barbiturates only -Urine shows mild ketosis -Anion gap downtrending with IV fluids, etiology remains unspecified -potentially lactic in origin but labs not obtained at intake   DVT prophylaxis: Heparin Code Status: Full Family Communication: Husband at bedside  Status is: Inpatient  Dispo: The patient is from: Home              Anticipated d/c is to: To be determined              Anticipated d/c date is: 48 to 72 hours              Patient currently not medically stable for discharge  Consultants:  None  Procedures:  None  Antimicrobials:  Ceftriaxone  Subjective: No acute issues or events overnight, patient continues to feel "unwell" but somewhat improving, continues to complain of dysphagia hoping to get more information from barium swallow  Objective: Vitals:   10/03/21 0612 10/03/21 1407 10/03/21 2225 10/04/21 0554  BP: (!) 158/67 (!) 174/75 (!) 154/82 (!) 176/81  Pulse: 65 71 75 74  Resp: 17 16 17 17   Temp: 98.8 F (37.1 C) 98.5 F (36.9 C) 98.1 F (36.7 C) 98 F (36.7 C)  TempSrc:  Oral    SpO2: 97% 98% 95% 98%  Weight:      Height:        Intake/Output Summary (Last 24 hours) at 10/04/2021 0802 Last data filed at 10/04/2021 0600 Gross per  24 hour  Intake 1630.77 ml  Output 1150 ml  Net 480.77 ml    Filed Weights   10/01/21 1231 10/01/21 2215  Weight: 66.4 kg 57.7 kg    Examination:  General:  Pleasantly resting in bed, No acute distress. HEENT:  Normocephalic atraumatic.  Sclerae nonicteric, noninjected.  Extraocular movements intact bilaterally. Neck:  Without mass or deformity.  Trachea is midline. Lungs:  Clear to  auscultate bilaterally without rhonchi, wheeze, or rales. Heart:  Regular rate and rhythm.  Without murmurs, rubs, or gallops. Abdomen:  Soft, nontender, nondistended.  Without guarding or rebound. Extremities: Without cyanosis, clubbing, edema, or obvious deformity. Vascular:  Dorsalis pedis and posterior tibial pulses palpable bilaterally. Skin:  Warm and dry, no erythema, no ulcerations.  Data Reviewed: I have personally reviewed following labs and imaging studies  CBC: Recent Labs  Lab 10/01/21 1235 10/01/21 1734 10/03/21 0342 10/04/21 0333  WBC 11.4* 10.9* 8.5 7.8  NEUTROABS 9.9*  --   --   --   HGB 14.6 12.2 11.5* 12.0  HCT 43.7 37.2 34.8* 35.9*  MCV 90.3 90.5 90.2 89.3  PLT 278 187 188 536    Basic Metabolic Panel: Recent Labs  Lab 10/01/21 1235 10/01/21 1734 10/02/21 0338 10/03/21 0342 10/04/21 0333  NA 144  --  138 134* 137  K 3.4*  --  2.8* 5.2* 3.7  CL 99  --  101 102 103  CO2 22  --  20* 16* 17*  GLUCOSE 118*  --  88 93 84  BUN 32*  --  28* 19 15  CREATININE 1.44* 1.14* 1.01* 1.01* 0.99  CALCIUM 9.5  --  8.2* 8.2* 8.5*    GFR: Estimated Creatinine Clearance: 37.5 mL/min (by C-G formula based on SCr of 0.99 mg/dL). Liver Function Tests: Recent Labs  Lab 10/01/21 1235 10/02/21 0338 10/03/21 0342 10/04/21 0333  AST 36 20 21 24   ALT 24 17 16 19   ALKPHOS 80 60 56 59  BILITOT 1.2 1.7* 1.5* 1.4*  PROT 7.6 5.5* 5.6* 5.9*  ALBUMIN 3.6 2.8* 2.8* 2.9*    Recent Labs  Lab 10/01/21 1235  LIPASE 50    No results for input(s): AMMONIA in the last 168 hours. Coagulation Profile: No results for input(s): INR, PROTIME in the last 168 hours. Cardiac Enzymes: No results for input(s): CKTOTAL, CKMB, CKMBINDEX, TROPONINI in the last 168 hours. BNP (last 3 results) No results for input(s): PROBNP in the last 8760 hours. HbA1C: No results for input(s): HGBA1C in the last 72 hours. CBG: No results for input(s): GLUCAP in the last 168 hours. Lipid  Profile: No results for input(s): CHOL, HDL, LDLCALC, TRIG, CHOLHDL, LDLDIRECT in the last 72 hours. Thyroid Function Tests: No results for input(s): TSH, T4TOTAL, FREET4, T3FREE, THYROIDAB in the last 72 hours. Anemia Panel: No results for input(s): VITAMINB12, FOLATE, FERRITIN, TIBC, IRON, RETICCTPCT in the last 72 hours. Sepsis Labs: No results for input(s): PROCALCITON, LATICACIDVEN in the last 168 hours.  Recent Results (from the past 240 hour(s))  Resp Panel by RT-PCR (Flu A&B, Covid) Nasopharyngeal Swab     Status: None   Collection Time: 10/01/21  1:43 PM   Specimen: Nasopharyngeal Swab; Nasopharyngeal(NP) swabs in vial transport medium  Result Value Ref Range Status   SARS Coronavirus 2 by RT PCR NEGATIVE NEGATIVE Final    Comment: (NOTE) SARS-CoV-2 target nucleic acids are NOT DETECTED.  The SARS-CoV-2 RNA is generally detectable in upper respiratory specimens during the acute phase of infection. The  lowest concentration of SARS-CoV-2 viral copies this assay can detect is 138 copies/mL. A negative result does not preclude SARS-Cov-2 infection and should not be used as the sole basis for treatment or other patient management decisions. A negative result may occur with  improper specimen collection/handling, submission of specimen other than nasopharyngeal swab, presence of viral mutation(s) within the areas targeted by this assay, and inadequate number of viral copies(<138 copies/mL). A negative result must be combined with clinical observations, patient history, and epidemiological information. The expected result is Negative.  Fact Sheet for Patients:  EntrepreneurPulse.com.au  Fact Sheet for Healthcare Providers:  IncredibleEmployment.be  This test is no t yet approved or cleared by the [place] FDA and  has been authorized for detection and/or diagnosis of SARS-CoV-2 by FDA under an Emergency Use Authorization (EUA). This EUA  will remain  in effect (meaning this test can be used) for the duration of the COVID-19 declaration under Section 564(b)(1) of the Act, 21 U.S.C.section 360bbb-3(b)(1), unless the authorization is terminated  or revoked sooner.       Influenza A by PCR NEGATIVE NEGATIVE Final   Influenza B by PCR NEGATIVE NEGATIVE Final    Comment: (NOTE) The Xpert Xpress SARS-CoV-2/FLU/RSV plus assay is intended as an aid in the diagnosis of influenza from Nasopharyngeal swab specimens and should not be used as a sole basis for treatment. Nasal washings and aspirates are unacceptable for Xpert Xpress SARS-CoV-2/FLU/RSV testing.  Fact Sheet for Patients: EntrepreneurPulse.com.au  Fact Sheet for Healthcare Providers: IncredibleEmployment.be  This test is not yet approved or cleared by the [place] FDA and has been authorized for detection and/or diagnosis of SARS-CoV-2 by FDA under an Emergency Use Authorization (EUA). This EUA will remain in effect (meaning this test can be used) for the duration of the COVID-19 declaration under Section 564(b)(1) of the Act, 21 U.S.C. section 360bbb-3(b)(1), unless the authorization is terminated or revoked.  Performed at [facility], [place] [address] [place], [place] [address]           Radiology Studies: No results found.  Scheduled Meds:  buPROPion  150 mg Oral Daily   feeding supplement  237 mL Oral TID BM   heparin  5,000 Units Subcutaneous Q8H   levothyroxine  50 mcg Oral Q0600   memantine  10 mg Oral BID   multivitamin with minerals  1 tablet Oral Daily   Continuous Infusions:  sodium chloride 10 mL/hr at 10/03/21 1041   cefTRIAXone (ROCEPHIN)  IV 1 g (10/03/21 1044)   lactated ringers 75 mL/hr at 10/03/21 1648     LOS: 2 days   Time spent: 29min  Ryan Ogborn C Trea Carnegie, DO [facility]  If 7PM-7AM, please contact night-coverage [email or web address]  10/04/2021, 8:02 AM

## 2021-10-05 ENCOUNTER — Inpatient Hospital Stay (HOSPITAL_COMMUNITY): Payer: Medicare HMO

## 2021-10-05 DIAGNOSIS — N390 Urinary tract infection, site not specified: Secondary | ICD-10-CM | POA: Diagnosis not present

## 2021-10-05 DIAGNOSIS — N179 Acute kidney failure, unspecified: Secondary | ICD-10-CM | POA: Diagnosis not present

## 2021-10-05 LAB — CBC
HCT: 34.1 % — ABNORMAL LOW (ref 36.0–46.0)
Hemoglobin: 11.7 g/dL — ABNORMAL LOW (ref 12.0–15.0)
MCH: 30.6 pg (ref 26.0–34.0)
MCHC: 34.3 g/dL (ref 30.0–36.0)
MCV: 89.3 fL (ref 80.0–100.0)
Platelets: 178 10*3/uL (ref 150–400)
RBC: 3.82 MIL/uL — ABNORMAL LOW (ref 3.87–5.11)
RDW: 15.4 % (ref 11.5–15.5)
WBC: 8.2 10*3/uL (ref 4.0–10.5)
nRBC: 0 % (ref 0.0–0.2)

## 2021-10-05 LAB — COMPREHENSIVE METABOLIC PANEL
ALT: 18 U/L (ref 0–44)
AST: 23 U/L (ref 15–41)
Albumin: 3 g/dL — ABNORMAL LOW (ref 3.5–5.0)
Alkaline Phosphatase: 58 U/L (ref 38–126)
Anion gap: 20 — ABNORMAL HIGH (ref 5–15)
BUN: 11 mg/dL (ref 8–23)
CO2: 15 mmol/L — ABNORMAL LOW (ref 22–32)
Calcium: 8.7 mg/dL — ABNORMAL LOW (ref 8.9–10.3)
Chloride: 105 mmol/L (ref 98–111)
Creatinine, Ser: 0.91 mg/dL (ref 0.44–1.00)
GFR, Estimated: >60 mL/min (ref >60)
Glucose, Bld: 89 mg/dL (ref 70–99)
Potassium: 3.7 mmol/L (ref 3.5–5.1)
Sodium: 140 mmol/L (ref 135–145)
Total Bilirubin: 1.3 mg/dL — ABNORMAL HIGH (ref 0.3–1.2)
Total Protein: 6.1 g/dL — ABNORMAL LOW (ref 6.5–8.1)

## 2021-10-05 MED ORDER — PRIMIDONE 50 MG PO TABS
50.0000 mg | ORAL_TABLET | Freq: Every day | ORAL | Status: DC
  Administered 2021-10-05 – 2021-10-15 (×11): 50 mg via ORAL
  Filled 2021-10-05 (×12): qty 1

## 2021-10-05 MED ORDER — DONEPEZIL HCL 10 MG PO TABS
10.0000 mg | ORAL_TABLET | Freq: Every day | ORAL | Status: DC
  Administered 2021-10-05 – 2021-10-15 (×11): 10 mg via ORAL
  Filled 2021-10-05 (×11): qty 1

## 2021-10-05 MED ORDER — PRIMIDONE 50 MG PO TABS
100.0000 mg | ORAL_TABLET | Freq: Every day | ORAL | Status: DC
  Administered 2021-10-06 – 2021-10-15 (×9): 100 mg via ORAL
  Filled 2021-10-05 (×12): qty 2

## 2021-10-05 MED ORDER — FLUOXETINE HCL 20 MG PO CAPS
40.0000 mg | ORAL_CAPSULE | Freq: Every day | ORAL | Status: DC
  Administered 2021-10-06 – 2021-10-15 (×8): 40 mg via ORAL
  Filled 2021-10-05 (×9): qty 2

## 2021-10-05 MED ORDER — PRIMIDONE 50 MG PO TABS: 50.0000 mg | ORAL_TABLET | Freq: Two times a day (BID) | ORAL | Status: DC

## 2021-10-05 NOTE — Progress Notes (Signed)
Modified Barium Swallow Progress Note  Patient Details  Name: Nicole Ruiz MRN: [id number] Date of Birth: 10/23/1941  Today's Date: 10/05/2021  Modified Barium Swallow completed.  Full report located under Chart Review in the Imaging Section.  Brief recommendations include the following:  Clinical Impression  Patient presents with a mild oropharyngeal dysphagia as per limited amount of barium tested. Patient consumed small to medium sip of thin liquid barium and then immediately started c/o nausea. She did not regurgitate anything and although she did settle down and say she thought she could try another sip, she then started retching again and c/o feeling unsteady while sitting and requesting something to "hold onto". SLP stopped MBS and radiology technician reclined her slightly then helped her back into bed. During the one swallow that she did perform, she exhibited brief holding of thin liquid barium in oral cavity, followed by a swallow initiation delay to level of vallecular sinus. No penetration or aspiration observed, no retrograde movement of bolus observed. UES opening was WNL and no retention of barium anywhere in pharynx, no residuals post swallow. SLP did not observe any signs of stricture and did not observe any impediment to thin liquid barium bolus while transiting in pharynx and through UES or upper portion of esophagus. SLP recommending continue with full liquids at this time. No further ST intervention warranted as patient should be able to advance with solids as tolerated.   Swallow Evaluation Recommendations       SLP Diet Recommendations: Thin liquid;Other (Comment) (continue full liquids)   Liquid Administration via: Cup;Straw   Medication Administration: Whole meds with puree   Supervision: Patient able to self feed;Staff to assist with self feeding   Compensations: Slow rate;Small sips/bites   Postural Changes: Seated upright at 90 degrees   Oral Care  Recommendations: Oral care BID;Staff/trained caregiver to provide oral care       Sonia Baller, MA, CCC-SLP Speech Therapy

## 2021-10-05 NOTE — Progress Notes (Signed)
PROGRESS NOTE    Nicole Ruiz  TDV:[id number] DOB: December 02, 1941 DOA: 10/01/2021 PCP: Bernerd Limbo, MD   Brief Narrative:  Nicole Ruiz is a 80 y.o. female with medical history significant of hypertension, hyperlipidemia, CKD stage II, depression who presents with intractable nausea vomiting and poor p.o. intake with dehydration over the past 3 to 4 days.  Patient denies any sick contacts recent travel or change in dietary habits.  Apparently some 4 days prior to admission she was having some mild nausea symptoms with poor p.o. intake which continued to worsen over the past 4 days now with intractable symptoms and abnormal labs as evaluated in the ED with elevated creatinine from baseline and an anion gap greater than 20. Hospitalist called to admit.    Assessment/Plan   Acute metabolic encephalopathy, POA, resolving On likely baseline dementia Rule out polypharmacy -Patient's mental status appears to be approaching baseline -Baseline dementia suspected, unclear diagnosis -UDS positive for barbiturates -patient declines being on anything in this drug class, current home med list we have access to (again unclear if this is accurate) does not contain barbs...unclear how she is positive for this medication  Intractable nausea vomiting, unable to rule out infectious process Dysphagia, ongoing -Unclear etiology, suspect viral given somewhat abrupt onset without any sick contacts or dietary changes unlikely foodborne illness -Continue supportive care Zofran and Phenergan IV fluids until p.o. intake improves -Repeat attempt barium swallow today with IR, lengthy discussion with patient and husband at bedside about need for imaging to help evaluate patient's swallowing difficulty.  If unsuccessful will discuss with GI for possible alternative imaging versus endoscopy although we discussed risk factors associated with endoscopy versus barium swallow and imaging.  Mild acute kidney injury on CKD 2,  resolved -Creatinine back to baseline -Continue LR until PO intake improves  UTI, POA, resolved -Continue ceftriaxone, continues to have bladder spasm and transient abdominal pain that is fleeting lasting only a few seconds -Continue to follow urine cultures, de-escalate antibiotics as possible   Anion gap metabolic acidosis of unclear etiology, resolving -Unclear etiology, BUN 06Y at intake, salicylate acetaminophen and ethanol levels unremarkable, UDS positive for barbiturates only -Urine shows mild ketosis -Anion gap downtrending with IV fluids, etiology remains unspecified - potentially lactic in origin but labs not obtained at intake   DVT prophylaxis: Heparin Code Status: Full Family Communication: Husband at bedside  Status is: Inpatient  Dispo: The patient is from: Home              Anticipated d/c is to: To be determined              Anticipated d/c date is: 48 to 72 hours              Patient currently not medically stable for discharge  Consultants:  None  Procedures:  None  Antimicrobials:  Ceftriaxone  Subjective: Patient continues to experience episodes of dysphagia and nausea and vomiting despite p.o. intake, episode overnight of choking  Objective: Vitals:   10/04/21 0554 10/04/21 1422 10/04/21 2140 10/05/21 0501  BP: (!) 176/81 (!) 160/81 (!) 180/63 (!) 181/85  Pulse: 74 71 76 76  Resp: 17 20 17 20   Temp: 98 F (36.7 C) 98.7 F (37.1 C) 98.3 F (36.8 C) 98 F (36.7 C)  TempSrc:  Oral  Oral  SpO2: 98% 100% 98% 100%  Weight:      Height:        Intake/Output Summary (Last 24 hours) at 10/05/2021 0735  Last data filed at 10/05/2021 0600 Gross per 24 hour  Intake 2040 ml  Output 700 ml  Net 1340 ml    Filed Weights   10/01/21 1231 10/01/21 2215  Weight: 66.4 kg 57.7 kg    Examination:  General:  Pleasantly resting in bed, No acute distress. HEENT:  Normocephalic atraumatic.  Sclerae nonicteric, noninjected.  Extraocular movements intact  bilaterally. Neck:  Without mass or deformity.  Trachea is midline. Lungs:  Clear to auscultate bilaterally without rhonchi, wheeze, or rales. Heart:  Regular rate and rhythm.  Without murmurs, rubs, or gallops. Abdomen:  Soft, nontender, nondistended.  Without guarding or rebound. Extremities: Without cyanosis, clubbing, edema, or obvious deformity. Vascular:  Dorsalis pedis and posterior tibial pulses palpable bilaterally. Skin:  Warm and dry, no erythema, no ulcerations.  Data Reviewed: I have personally reviewed following labs and imaging studies  CBC: Recent Labs  Lab 10/01/21 1235 10/01/21 1734 10/03/21 0342 10/04/21 0333 10/05/21 0331  WBC 11.4* 10.9* 8.5 7.8 8.2  NEUTROABS 9.9*  --   --   --   --   HGB 14.6 12.2 11.5* 12.0 11.7*  HCT 43.7 37.2 34.8* 35.9* 34.1*  MCV 90.3 90.5 90.2 89.3 89.3  PLT 278 187 188 190 973    Basic Metabolic Panel: Recent Labs  Lab 10/01/21 1235 10/01/21 1734 10/02/21 0338 10/03/21 0342 10/04/21 0333 10/05/21 0331  NA 144  --  138 134* 137 140  K 3.4*  --  2.8* 5.2* 3.7 3.7  CL 99  --  101 102 103 105  CO2 22  --  20* 16* 17* 15*  GLUCOSE 118*  --  88 93 84 89  BUN 32*  --  28* 19 15 11   CREATININE 1.44* 1.14* 1.01* 1.01* 0.99 0.91  CALCIUM 9.5  --  8.2* 8.2* 8.5* 8.7*    GFR: Estimated Creatinine Clearance: 40.8 mL/min (by C-G formula based on SCr of 0.91 mg/dL). Liver Function Tests: Recent Labs  Lab 10/01/21 1235 10/02/21 0338 10/03/21 0342 10/04/21 0333 10/05/21 0331  AST 36 20 21 24 23   ALT 24 17 16 19 18   ALKPHOS 80 60 56 59 58  BILITOT 1.2 1.7* 1.5* 1.4* 1.3*  PROT 7.6 5.5* 5.6* 5.9* 6.1*  ALBUMIN 3.6 2.8* 2.8* 2.9* 3.0*    Recent Labs  Lab 10/01/21 1235  LIPASE 50    No results for input(s): AMMONIA in the last 168 hours. Coagulation Profile: No results for input(s): INR, PROTIME in the last 168 hours. Cardiac Enzymes: No results for input(s): CKTOTAL, CKMB, CKMBINDEX, TROPONINI in the last 168  hours. BNP (last 3 results) No results for input(s): PROBNP in the last 8760 hours. HbA1C: No results for input(s): HGBA1C in the last 72 hours. CBG: No results for input(s): GLUCAP in the last 168 hours. Lipid Profile: No results for input(s): CHOL, HDL, LDLCALC, TRIG, CHOLHDL, LDLDIRECT in the last 72 hours. Thyroid Function Tests: No results for input(s): TSH, T4TOTAL, FREET4, T3FREE, THYROIDAB in the last 72 hours. Anemia Panel: No results for input(s): VITAMINB12, FOLATE, FERRITIN, TIBC, IRON, RETICCTPCT in the last 72 hours. Sepsis Labs: No results for input(s): PROCALCITON, LATICACIDVEN in the last 168 hours.  Recent Results (from the past 240 hour(s))  Resp Panel by RT-PCR (Flu A&B, Covid) Nasopharyngeal Swab     Status: None   Collection Time: 10/01/21  1:43 PM   Specimen: Nasopharyngeal Swab; Nasopharyngeal(NP) swabs in vial transport medium  Result Value Ref Range Status   SARS Coronavirus 2  by RT PCR NEGATIVE NEGATIVE Final    Comment: (NOTE) SARS-CoV-2 target nucleic acids are NOT DETECTED.  The SARS-CoV-2 RNA is generally detectable in upper respiratory specimens during the acute phase of infection. The lowest concentration of SARS-CoV-2 viral copies this assay can detect is 138 copies/mL. A negative result does not preclude SARS-Cov-2 infection and should not be used as the sole basis for treatment or other patient management decisions. A negative result may occur with  improper specimen collection/handling, submission of specimen other than nasopharyngeal swab, presence of viral mutation(s) within the areas targeted by this assay, and inadequate number of viral copies(<138 copies/mL). A negative result must be combined with clinical observations, patient history, and epidemiological information. The expected result is Negative.  Fact Sheet for Patients:  EntrepreneurPulse.com.au  Fact Sheet for Healthcare Providers:   IncredibleEmployment.be  This test is no t yet approved or cleared by the [place] FDA and  has been authorized for detection and/or diagnosis of SARS-CoV-2 by FDA under an Emergency Use Authorization (EUA). This EUA will remain  in effect (meaning this test can be used) for the duration of the COVID-19 declaration under Section 564(b)(1) of the Act, 21 U.S.C.section 360bbb-3(b)(1), unless the authorization is terminated  or revoked sooner.       Influenza A by PCR NEGATIVE NEGATIVE Final   Influenza B by PCR NEGATIVE NEGATIVE Final    Comment: (NOTE) The Xpert Xpress SARS-CoV-2/FLU/RSV plus assay is intended as an aid in the diagnosis of influenza from Nasopharyngeal swab specimens and should not be used as a sole basis for treatment. Nasal washings and aspirates are unacceptable for Xpert Xpress SARS-CoV-2/FLU/RSV testing.  Fact Sheet for Patients: EntrepreneurPulse.com.au  Fact Sheet for Healthcare Providers: IncredibleEmployment.be  This test is not yet approved or cleared by the [place] FDA and has been authorized for detection and/or diagnosis of SARS-CoV-2 by FDA under an Emergency Use Authorization (EUA). This EUA will remain in effect (meaning this test can be used) for the duration of the COVID-19 declaration under Section 564(b)(1) of the Act, 21 U.S.C. section 360bbb-3(b)(1), unless the authorization is terminated or revoked.  Performed at [facility], [place] [address] [place], [place] [address]           Radiology Studies: No results found.  Scheduled Meds:  buPROPion  150 mg Oral Daily   feeding supplement  237 mL Oral TID BM   heparin  5,000 Units Subcutaneous Q8H   levothyroxine  50 mcg Oral Q0600   memantine  10 mg Oral BID   multivitamin with minerals  1 tablet Oral Daily   Continuous Infusions:  sodium chloride 10 mL/hr at 10/03/21 1041   lactated ringers  75 mL/hr at 10/03/21 1648     LOS: 3 days   Time spent: 88min  Kitzia Camus C Louisa Favaro, DO [facility]  If 7PM-7AM, please contact night-coverage [email or web address]  10/05/2021, 7:35 AM

## 2021-10-06 ENCOUNTER — Inpatient Hospital Stay (HOSPITAL_COMMUNITY): Payer: Medicare HMO

## 2021-10-06 ENCOUNTER — Encounter (HOSPITAL_COMMUNITY): Payer: Self-pay | Admitting: Internal Medicine

## 2021-10-06 DIAGNOSIS — N179 Acute kidney failure, unspecified: Secondary | ICD-10-CM | POA: Diagnosis not present

## 2021-10-06 DIAGNOSIS — N390 Urinary tract infection, site not specified: Secondary | ICD-10-CM | POA: Diagnosis not present

## 2021-10-06 LAB — COMPREHENSIVE METABOLIC PANEL
ALT: 19 U/L (ref 0–44)
AST: 22 U/L (ref 15–41)
Albumin: 2.8 g/dL — ABNORMAL LOW (ref 3.5–5.0)
Alkaline Phosphatase: 53 U/L (ref 38–126)
Anion gap: 16 — ABNORMAL HIGH (ref 5–15)
BUN: 11 mg/dL (ref 8–23)
CO2: 16 mmol/L — ABNORMAL LOW (ref 22–32)
Calcium: 8.5 mg/dL — ABNORMAL LOW (ref 8.9–10.3)
Chloride: 108 mmol/L (ref 98–111)
Creatinine, Ser: 0.87 mg/dL (ref 0.44–1.00)
GFR, Estimated: >60 mL/min (ref >60)
Glucose, Bld: 107 mg/dL — ABNORMAL HIGH (ref 70–99)
Potassium: 3 mmol/L — ABNORMAL LOW (ref 3.5–5.1)
Sodium: 140 mmol/L (ref 135–145)
Total Bilirubin: 1.2 mg/dL (ref 0.3–1.2)
Total Protein: 5.6 g/dL — ABNORMAL LOW (ref 6.5–8.1)

## 2021-10-06 LAB — CBC
HCT: 30.8 % — ABNORMAL LOW (ref 36.0–46.0)
Hemoglobin: 10.6 g/dL — ABNORMAL LOW (ref 12.0–15.0)
MCH: 30.2 pg (ref 26.0–34.0)
MCHC: 34.4 g/dL (ref 30.0–36.0)
MCV: 87.7 fL (ref 80.0–100.0)
Platelets: 171 10*3/uL (ref 150–400)
RBC: 3.51 MIL/uL — ABNORMAL LOW (ref 3.87–5.11)
RDW: 15.5 % (ref 11.5–15.5)
WBC: 7.7 10*3/uL (ref 4.0–10.5)
nRBC: 0 % (ref 0.0–0.2)

## 2021-10-06 MED ORDER — IOHEXOL 350 MG/ML SOLN
75.0000 mL | Freq: Once | INTRAVENOUS | Status: AC | PRN
  Administered 2021-10-06: 60 mL via INTRAVENOUS

## 2021-10-06 MED ORDER — POTASSIUM CHLORIDE 10 MEQ/100ML IV SOLN
10.0000 meq | INTRAVENOUS | Status: AC
  Administered 2021-10-06 (×4): 10 meq via INTRAVENOUS
  Filled 2021-10-06 (×4): qty 100

## 2021-10-06 NOTE — Plan of Care (Signed)

## 2021-10-06 NOTE — Care Management Important Message (Signed)
Important Message  Patient Details IM Letter given to the Patient. Name: Nicole Ruiz MRN: [id number] Date of Birth: Nov 14, 1941   Medicare Important Message Given:  Yes     Kerin Salen 10/06/2021, 3:02 PM

## 2021-10-06 NOTE — Consult Note (Addendum)
Referring Provider: Dr. Holli Humbles  Primary Care Physician:  Bernerd Limbo, MD Primary Gastroenterologist:  Dr. Olevia Perches in 2017.   Reason for Consultation: Dysphagia  HPI: Nicole Ruiz is a 80 y.o. 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  She presented to [facility] ED on 10/01/2021 due to having nausea, vomiting x  for 3 to 4 days. She apparently fell at home 1 to 2 days prior to presenting to the ED. CTAP without contrast without acute findings to explain her symptoms, no bowel obstruction.  A head CT showed atrophy and chronic small vessel ischemic changes without acute intracranial abnormality.  Labs showed a sodium level 144.  Potassium 3.4.  BUN 32.  Creatinine 1.44.  Normal LFTs.  WBC 11.4.  Hemoglobin 14.6.  SARS coronavirus 2 negative.  Moderate urine leukocytes.  He has dementia therefore she is not able to provide her history.  Her husband is currently not at the bedside and he was previously reported to be a poor historian therefore her past medical history was obtained from the admission physician's consult note, epic and care everywhere records.  Her husband reported she developed difficulty swallowing a few days after she fell at home with associated poor oral intake.  It is unclear if she has any past history of dysphagia or GERD.  She tells me food gets stuck in the back of her throat but she is unable to tell me how often this occurs. She underwent a SLP by speech pathologist 10/03/2021 which was unsuccessful due to the patient's decreased participation.  A repeat swallow study at the bedside 10/05/2021 was also limited, she swallowed a small to medium sip of thin barium then immediately complained of nausea.  Shortly after swallowing the barium she developed retching without  regurgitation.  A full liquid diet was recommended.  A GI consult was requested for further evaluation regarding dysphagia.  I spoke to her dayshift nurse who took care of the patient for a few days last week.  Her RN she stated the patient is able to swallow medications which are crushed and given with applesauce or chocolate pudding.  She stated the patient starts to gag before the spoon even hits her mouth.  No observed vomiting.  She passed a solid stool a few days ago.  No rectal bleeding.  No diarrhea since admission.  Addendum: Her husband returned to the bedside. He stated his wife was swallowing without difficulty until 3 weeks ago after she fell at home and landed on her back. Since then, she's had difficulty swallowing her pills but with effort she was able to swallow them. However, her husband reported she has not eaten any solid food for the past 3 weeks because it would hurt throughout her chest/esophagus and mid abdomen. He can't tell if she's lost any weight. A chest CTA done 03/01/2021 during her hospital admission identified extensive bilateral PE but also identified scattered centrilobular ground-glass nodularity throughout both lungs.   ECHO 03/02/2021: 1. Left ventricular ejection fraction, by estimation, is 60 to 65%. The left ventricle has normal function. The left ventricle has no regional wall motion abnormalities. There is moderate concentric left ventricular hypertrophy. Left ventricular diastolic parameters were normal. 2. Right ventricular systolic function is mildly reduced. The right ventricular size is mildly enlarged. There is normal pulmonary artery systolic pressure. 3. No  clear color doppler flow seen across intraatrial septum, but limited images. Rapidly, vigorously positive bubble study (image 87) suggests significant right to left shunt present. Agitated saline contrast bubble study was positive with shunting observed within 3-6 cardiac cycles suggestive of  interatrial shunt. 4. Right atrial size was mildly dilated. 5. The mitral valve is normal in structure. Mild mitral valve regurgitation. No evidence of mitral stenosis. 6. The aortic valve is tricuspid. Aortic valve regurgitation is not visualized. No aortic stenosis is Present  Chest CTA 03/01/2021: 1. Extensive bilateral pulmonary emboli involving the distal left and right main pulmonary arteries into the lobar and segmental branches throughout both lungs. Positive for acute PE with CT evidence of right heart strain (RV/LV Ratio 1.67) consistent with at least submassive (intermediate risk) PE. The presence of right heart strain has been associated with an increased risk of morbidity and mortality. 2. Scattered centrilobular ground-glass nodularity throughout both lungs, with a slight mid to lower lung predominance. Can be seen with an infectious or inflammatory bronchiolitis in the appropriate clinical setting including atypical infections. 3. Aortic Atherosclerosis (ICD10-I70.0). 4. Currently attempting to contact the ordering provider with a critical value result. Addendum will be submitted upon case discussion.  Colonoscopy 03/31/2016 by Dr. Delfin Edis: 5 mm sessile polyp at 70 cm distal from the IC valve and internal hemorrhoids. 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[place])    Chronic kidney disease    Depression    Hyperlipidemia    Hypertension    Stroke [facility])     Past Surgical History:  Procedure Laterality Date   ABDOMINAL HYSTERECTOMY     ARM SURGERY     CARDIAC CATHETERIZATION  2013   negative   EP IMPLANTABLE DEVICE N/A 10/17/2015   Procedure: Loop Recorder Insertion;  Surgeon: Will Meredith Leeds, MD;  Location: [place] [facility];  Service: Cardiovascular;  Laterality: N/A;   EYE SURGERY     FEMUR IM NAIL Right 10/20/2015   Procedure: INTRAMEDULLARY (IM) RETROGRADE  FEMORAL NAILING;  Surgeon: Tania Ade, MD;  Location: [place];  Service: Orthopedics;  Laterality: Right;   HEMORROIDECTOMY     KNEE SURGERY     TEE WITHOUT CARDIOVERSION N/A 10/17/2015   Procedure: TRANSESOPHAGEAL ECHOCARDIOGRAM (TEE);  Surgeon: Jerline Pain, MD;  Location: [facility] ENDOSCOPY;  Service: Cardiovascular;  Laterality: N/A;    Prior to Admission medications   Medication Sig Start Date End Date Taking? Authorizing Provider  ALPRAZolam (XANAX XR) 0.5 MG 24 hr tablet Take 1 tablet (0.5 mg total) by mouth daily. 08/17/18   Frann Rider, NP  apixaban (ELIQUIS) 5 MG TABS tablet TAKE 1 TABLET (5 MG TOTAL) BY MOUTH TWO TIMES DAILY. 03/06/21 03/06/22  Nita Sells, MD  atorvastatin (LIPITOR) 20 MG tablet Take 20 mg by mouth daily. 08/16/18   [provider]  buPROPion (WELLBUTRIN XL) 150 MG 24 hr tablet Take by mouth. 06/23/18   [provider]  cephALEXin (KEFLEX) 500 MG capsule Take 1 capsule (500 mg total) by mouth every 12 (twelve) hours. 03/06/21   Nita Sells, MD  cephALEXin (KEFLEX) 500 MG capsule TAKE 1 CAPSULE (500 MG TOTAL) BY MOUTH EVERY TWELVE HOURS. 03/06/21 03/06/22  Nita Sells, MD  Cholecalciferol (VITAMIN D3) 1000 units CAPS Take by mouth.    [provider]  Cyanocobalamin (VITAMIN B 12 PO) Take 1,000 mg by mouth.    [provider]  donepezil (ARICEPT) 10 MG tablet Take 10 mg by mouth daily. 12/24/20   [provider]  FLUoxetine (PROZAC) 40 MG capsule Take 1 capsule (40 mg total) by mouth daily. 05/17/18 05/17/19  Frann Rider, NP  levothyroxine (SYNTHROID, LEVOTHROID) 50 MCG tablet  07/27/18   [provider]  memantine (NAMENDA) 10 MG tablet Take 10 mg by mouth 2 (two) times daily. 12/11/20   [provider]  Omega-3 Fatty Acids (FISH OIL) 1000 MG CAPS Take by mouth.    [provider]  omeprazole (PRILOSEC OTC) 20 MG tablet Take 20 mg by mouth daily.    [provider]   polyvinyl alcohol (LIQUIFILM TEARS) 1.4 % ophthalmic solution Place 1 drop into both eyes every hour as needed for dry eyes.    [provider]  primidone (MYSOLINE) 50 MG tablet Take 50 mg by mouth 2 (two) times daily. Take 2 tablets in the morning and 1 tablet in the evening.    [provider]  propranolol (INDERAL) 10 MG tablet Take 1 tablet (10 mg total) by mouth 2 (two) times daily. 03/06/21   Nita Sells, MD  UNABLE TO FIND Mega red take one daily    [provider]  Vitamin D, Ergocalciferol, (DRISDOL) 1.25 MG (50000 UNIT) CAPS capsule Take 50,000 Units by mouth every 14 (fourteen) days. 01/07/21   [provider]    Current Facility-Administered Medications  Medication Dose Route Frequency Provider Last Rate Last Admin   0.9 %  sodium chloride infusion   Intravenous PRN Little Ishikawa, MD 10 mL/hr at 10/03/21 1041 Restarted at 10/03/21 1041   buPROPion (WELLBUTRIN XL) 24 hr tablet 150 mg  150 mg Oral Daily Little Ishikawa, MD   150 mg at 10/06/21 0842   donepezil (ARICEPT) tablet 10 mg  10 mg Oral QHS Little Ishikawa, MD   10 mg at 10/05/21 2052   feeding supplement (ENSURE ENLIVE / ENSURE PLUS) liquid 237 mL  237 mL Oral TID BM Little Ishikawa, MD       FLUoxetine (PROZAC) capsule 40 mg  40 mg Oral Daily Little Ishikawa, MD   40 mg at 10/06/21 0841   heparin injection 5,000 Units  5,000 Units Subcutaneous Q8H Little Ishikawa, MD   5,000 Units at 10/06/21 6010   lactated ringers infusion   Intravenous Continuous Little Ishikawa, MD 75 mL/hr at 10/06/21 1204 Infusion Verify at 10/06/21 1204   levothyroxine (SYNTHROID) tablet 50 mcg  50 mcg Oral Q0600 Little Ishikawa, MD   50 mcg at 10/06/21 9323   lip balm (CARMEX) ointment   Topical PRN Kathryne Eriksson, NP   75 application at 55/73/22 0310   memantine (NAMENDA) tablet 10 mg  10 mg Oral BID Little Ishikawa, MD   10 mg at 10/06/21 0254   multivitamin  with minerals tablet 1 tablet  1 tablet Oral Daily Little Ishikawa, MD   1 tablet at 10/06/21 0842   ondansetron [facility]) injection 4 mg  4 mg Intravenous Q6H PRN Little Ishikawa, MD   4 mg at 10/05/21 1255   ondansetron (ZOFRAN) tablet 4 mg  4 mg Oral Q6H PRN Little Ishikawa, MD       potassium chloride 10 mEq in 100 mL IVPB  10 mEq Intravenous Q1 Hr x 4 Little Ishikawa, MD 100 mL/hr at 10/06/21 1204 Infusion Verify at 10/06/21 1204   primidone (MYSOLINE) tablet 100 mg  100 mg  Oral Daily Little Ishikawa, MD   100 mg at 10/06/21 5597   And   primidone (MYSOLINE) tablet 50 mg  50 mg Oral QHS Little Ishikawa, MD   50 mg at 10/05/21 2052    Allergies as of 10/01/2021   (No Known Allergies)    Family History  Problem Relation Age of Onset   Heart disease Mother 78   Stroke Father    Heart disease Brother 57    Social History   Socioeconomic History   Marital status: Married    Spouse name: Not on file   Number of children: Not on file   Years of education: Not on file   Highest education level: Not on file  Occupational History   Not on file  Tobacco Use   Smoking status: Never   Smokeless tobacco: Never  Substance and Sexual Activity   Alcohol use: No   Drug use: No   Sexual activity: Never  Other Topics Concern   Not on file  Social History Narrative   Married.   Social Determinants of Health   Financial Resource Strain: Not on file  Food Insecurity: Not on file  Transportation Needs: Not on file  Physical Activity: Not on file  Stress: Not on file  Social Connections: Not on file  Intimate Partner Violence: Not on file    Review of Systems: Gen: Denies fever, sweats or chills. No weight loss.  CV: Denies chest pain, palpitations or edema. Resp: Denies cough, shortness of breath of hemoptysis.  GI: Denies heartburn, dysphagia, stomach or lower abdominal pain. No diarrhea or constipation. No rectal bleeding or melena.   GU : Denies  urinary burning, blood in urine, increased urinary frequency or incontinence. MS: Denies joint pain, muscles aches or weakness. Derm: Denies rash, itchiness, skin lesions or unhealing ulcers. Psych: Denies depression, anxiety, memory loss, suicidal ideation or confusion. Heme: Denies easy bruising, bleeding. Neuro:  Denies headaches, dizziness or paresthesias. Endo:  Denies any problems with DM, thyroid or adrenal function.  Physical Exam: Vital signs in last 24 hours: Temp:  [98.1 F (36.7 C)-99.2 F (37.3 C)] 98.4 F (36.9 C) (10/10 0831) Pulse Rate:  [76-80] 78 (10/10 0831) Resp:  [15-19] 15 (10/10 0831) BP: (155-172)/(66-86) 163/66 (10/10 0831) SpO2:  [97 %-98 %] 98 % (10/10 0831) Last BM Date: 10/02/21 General:  80 year old female alert in NAD. Head:  Normocephalic and atraumatic. Eyes:  No scleral icterus. Conjunctiva pink. Ears:  Normal auditory acuity. Nose:  No deformity, discharge or lesions. Mouth:  Dentition intact. A few white patches on tongue.  Neck:  Supple. No lymphadenopathy or thyromegaly.  Chest: Bony prominence below the suprasternal notch, mass vs bony deformity. Lungs:  Breath sounds clear throughout.  Heart:  Irregular rhythm, no murmur.  Abdomen:  Soft, nontender. + bowel sounds x 4 quads. No HSM. Rectal: Deferred. Musculoskeletal:  Symmetrical without gross deformities.  Pulses:  Normal pulses noted. Extremities:  Without clubbing or edema. Neurologic:  Alert and  oriented to name. She knows she is in the hospital. Speech is clear. Moves all extremities weakly. Skin:  Intact without significant lesions or rashes. Psych:  Alert and cooperative. Normal mood and affect.  Intake/Output from previous day: 10/09 0701 - 10/10 0700 In: 1726.2 [P.O.:260; I.V.:1466.2] Out: 850 [Urine:850] Intake/Output this shift: Total I/O In: 1332 [I.V.:1067.7; IV Piggyback:264.3] Out: -   Lab Results: Recent Labs    10/04/21 0333 10/05/21 0331 10/06/21 0342  WBC  7.8 8.2  7.7  HGB 12.0 11.7* 10.6*  HCT 35.9* 34.1* 30.8*  PLT 190 178 171   BMET Recent Labs    10/04/21 0333 10/05/21 0331 10/06/21 0342  NA 137 140 140  K 3.7 3.7 3.0*  CL 103 105 108  CO2 17* 15* 16*  GLUCOSE 84 89 107*  BUN 15 11 11   CREATININE 0.99 0.91 0.87  CALCIUM 8.5* 8.7* 8.5*   LFT Recent Labs    10/06/21 0342  PROT 5.6*  ALBUMIN 2.8*  AST 22  ALT 19  ALKPHOS 53  BILITOT 1.2   PT/INR No results for input(s): LABPROT, INR in the last 72 hours. Hepatitis Panel No results for input(s): HEPBSAG, HCVAB, HEPAIGM, HEPBIGM in the last 72 hours.    Studies/Results: DG Swallowing Func-Speech Pathology  Result Date: 10/05/2021 Table formatting from the original result was not included. 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 ([place])   Chronic kidney disease   Depression   Hyperlipidemia   Hypertension   Stroke [facility])  Past Surgical History: Past Surgical History: Procedure Laterality Date  ABDOMINAL HYSTERECTOMY    ARM SURGERY    CARDIAC CATHETERIZATION  2013  negative  EP IMPLANTABLE DEVICE N/A 10/17/2015  Procedure: Loop Recorder Insertion;  Surgeon: Will Meredith Leeds, MD;  Location: [place] [facility];  Service: Cardiovascular;  Laterality: N/A;  EYE SURGERY    FEMUR IM NAIL Right 10/20/2015  Procedure: INTRAMEDULLARY (IM) RETROGRADE FEMORAL NAILING;  Surgeon: Tania Ade, MD;  Location: [place];  Service: Orthopedics;  Laterality: Right;  HEMORROIDECTOMY    KNEE SURGERY    TEE WITHOUT CARDIOVERSION N/A 10/17/2015  Procedure: TRANSESOPHAGEAL ECHOCARDIOGRAM (TEE);  Surgeon: Jerline Pain, MD;  Location: [facility] ENDOSCOPY;   Service: Cardiovascular;  Laterality: N/A; HPI: 80 y.o. female with medical history significant of hypertension, hyperlipidemia, CKD stage II, CVA, depression who presents with intractable nausea vomiting and poor p.o. intake with dehydration.  Spouse reports he brought her to the hospital because he could not handle getting her to the bathroom anymore.  Per MD notes "Patient denies any sick contacts recent travel or change in dietary habits.   Swallow evaluation ordered due to pt's complaint of dysphagia - nausea/vomiting, globus.  Spouse reports since pt fell backward she has had difficulty swallowing.  He pointed to proximal esophagus to indicate region where pt's pills lodge. States pt consumes only mtn lion and water to take her pills and has not had a "meal" since her fall.  Subjective: patient awake, alert Assessment / Plan / Recommendation CHL IP CLINICAL IMPRESSIONS 10/05/2021 Clinical Impression Patient presents with a mild oropharyngeal dysphagia as per limited amount of barium tested. Patient consumed small to medium sip of thin liquid barium and then immediately started c/o nausea. She did not regurgitate anything and although she did settle down and say she thought she could try another sip, she then started retching again and c/o feeling unsteady while sitting and requesting something to "hold onto". SLP stopped MBS and radiology technician reclined her slightly then helped her back into bed. During the one swallow that she did perform, she exhibited brief holding of thin liquid barium in oral cavity, followed by a swallow  initiation delay to level of vallecular sinus. No penetration or aspiration observed, no retrograde movement of bolus observed. UES opening was WNL and no retention of barium anywhere in pharynx, no residuals post swallow. SLP did not observe any signs of stricture and did not observe any impediment to thin liquid barium bolus while transiting in pharynx and through UES or upper  portion of esophagus. SLP recommending continue with full liquids at this time. No further ST intervention warranted as patient should be able to advance with solids as tolerated. SLP Visit Diagnosis Dysphagia, oropharyngeal phase (R13.12) Attention and concentration deficit following -- Frontal lobe and executive function deficit following -- Impact on safety and function Risk for inadequate nutrition/hydration;No limitations   CHL IP TREATMENT RECOMMENDATION 10/05/2021 Treatment Recommendations No treatment recommended at this time   Prognosis 10/05/2021 Prognosis for Safe Diet Advancement Good Barriers to Reach Goals -- Barriers/Prognosis Comment -- CHL IP DIET RECOMMENDATION 10/05/2021 SLP Diet Recommendations Thin liquid;Other (Comment) Liquid Administration via Cup;Straw Medication Administration Whole meds with puree Compensations Slow rate;Small sips/bites Postural Changes Seated upright at 90 degrees   CHL IP OTHER RECOMMENDATIONS 10/05/2021 Recommended Consults -- Oral Care Recommendations Oral care BID;Staff/trained caregiver to provide oral care Other Recommendations --   CHL IP FOLLOW UP RECOMMENDATIONS 10/05/2021 Follow up Recommendations None   CHL IP FREQUENCY AND DURATION 10/16/2015 Speech Therapy Frequency (ACUTE ONLY) min 2x/week Treatment Duration --      CHL IP ORAL PHASE 10/05/2021 Oral Phase Impaired Oral - Pudding Teaspoon -- Oral - Pudding Cup -- Oral - Honey Teaspoon -- Oral - Honey Cup -- Oral - Nectar Teaspoon -- Oral - Nectar Cup -- Oral - Nectar Straw -- Oral - Thin Teaspoon -- Oral - Thin Cup Holding of bolus Oral - Thin Straw -- Oral - Puree -- Oral - Mech Soft -- Oral - Regular -- Oral - Multi-Consistency -- Oral - Pill -- Oral Phase - Comment --  CHL IP PHARYNGEAL PHASE 10/05/2021 Pharyngeal Phase Impaired Pharyngeal- Pudding Teaspoon -- Pharyngeal -- Pharyngeal- Pudding Cup -- Pharyngeal -- Pharyngeal- Honey Teaspoon -- Pharyngeal -- Pharyngeal- Honey Cup -- Pharyngeal -- Pharyngeal-  Nectar Teaspoon -- Pharyngeal -- Pharyngeal- Nectar Cup -- Pharyngeal -- Pharyngeal- Nectar Straw -- Pharyngeal -- Pharyngeal- Thin Teaspoon -- Pharyngeal -- Pharyngeal- Thin Cup Delayed swallow initiation-vallecula Pharyngeal -- Pharyngeal- Thin Straw -- Pharyngeal -- Pharyngeal- Puree -- Pharyngeal -- Pharyngeal- Mechanical Soft -- Pharyngeal -- Pharyngeal- Regular -- Pharyngeal -- Pharyngeal- Multi-consistency -- Pharyngeal -- Pharyngeal- Pill -- Pharyngeal -- Pharyngeal Comment --  CHL IP CERVICAL ESOPHAGEAL PHASE 10/05/2021 Cervical Esophageal Phase WFL Pudding Teaspoon -- Pudding Cup -- Honey Teaspoon -- Honey Cup -- Nectar Teaspoon -- Nectar Cup -- Nectar Straw -- Thin Teaspoon -- Thin Cup -- Thin Straw -- Puree -- Mechanical Soft -- Regular -- Multi-consistency -- Pill -- Cervical Esophageal Comment -- Sonia Baller, MA, CCC-SLP Speech Therapy              IMPRESSION/PLAN:  64) 80 year old female admitted to the hospital with N/V +/- abdominal pain, suspected viral etiology. CTAP was unrevealing. She subsequently reported having dysphagia, query chronic oropharyngeal (cervical esophagus) dysphagia since CVA 2016? Husband reports dysphagia started 3 weeks ago after she fell at home.  -Continue full liquid diet per RD -Continue Ondansetron 4 mg p.o. or IV every 6 hours as needed -Neck/soft tissue CT ordered by the hospitalist  -Recommend adding chest CT to neck CT orders. Chest CT to follow up on prior lung ground glass  nodularities and to further evaluate bony prominence/mass below the suprasternal notch -Consider EGD for further evaluation, rule out upper GI malignancy. Await further recommendations per Dr. Henrene Pastor  2) AKI on CKD stage III  3) History of a submassive PE with DVT 02/2021 treated with TPA then Eliquis x 3 to 6 months. No longer on Eliquis.   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 K+ 3.0.  -KCL  replacement per the hospitalist  6) Normocytic anemia.  Hemoglobin 11.7 -> 10.6. No overt GI bleeding.  -CBC, iron and ferritin level in am   Noralyn Pick  10/06/2021, 12:08 PM  GI ATTENDING  History, laboratories, x-rays reviewed.  Patient seen and examined.  Agree with comprehensive consultation note as outlined above.  At this point agree with supportive measures.  Continue to observe ability to swallow as she recovers from the acute illness.  She is high risk for endoscopic procedures but may require endoscopy for clarification of dysphagia if this cannot be sorted out with noninvasive measures or spontaneous resolution.  Docia Chuck. Geri Seminole., M.D. [facility] Division of Gastroenterology

## 2021-10-06 NOTE — Progress Notes (Signed)
PROGRESS NOTE    JOY REIGER  PNT:[id number] DOB: 1941/09/01 DOA: 10/01/2021 PCP: Bernerd Limbo, MD   Brief Narrative:  Nicole Ruiz is a 80 y.o. female with medical history significant of hypertension, hyperlipidemia, CKD stage II, depression who presents with intractable nausea vomiting and poor p.o. intake with dehydration over the past 3 to 4 days.  Patient denies any sick contacts recent travel or change in dietary habits.  Apparently some 4 days prior to admission she was having some mild nausea symptoms with poor p.o. intake which continued to worsen over the past 4 days now with intractable symptoms and abnormal labs as evaluated in the ED with elevated creatinine from baseline and an anion gap greater than 20. Hospitalist called to admit.    Assessment/Plan   Intractable nausea vomiting, unlikely infectious process, POA Dysphagia/odynophagia, ongoing -Unclear etiology -Patient and husband continue to be extremely poor historians, patient's intractable nausea and vomiting appear more to be in line with questionable obstructive process, husband indicates bypass difficulty swallowing more than intractable nausea and vomiting, previously infectious process less likely at this point -Continue supportive care Zofran and Phenergan IV fluids until p.o. intake improves -Multiple attempts for barium swallow marginally unsuccessful, see speech evaluation note as below without overt spillage or aspiration but patient able to only take minimal p.o. contrast -GI consulted for further insight recommendations, [facility] following we appreciate any assistance with this difficult and somewhat confusing case -CT neck pending to rule out any further anatomical abnormalities or findings and help explain her transient episodes of odynophagia  Acute metabolic encephalopathy, POA, resolved On likely baseline dementia Rule out polypharmacy -Patient's mental status appears to be approaching  baseline -Baseline dementia suspected, unclear diagnosis -UDS positive for barbiturates -patient declines being on anything in this drug class, current home med list we have access to (again unclear if this is accurate) does not contain barbs...unclear how she is positive for this medication unless false positive in the setting of high NSAID use (this is not the case per patient/husband).  Mild acute kidney injury on CKD 2, resolved -Creatinine back to baseline -Continue LR until PO intake improves  UTI, POA, resolved -Continue ceftriaxone, continues to have bladder spasm and transient abdominal pain that is fleeting lasting only a few seconds -Continue to follow urine cultures, de-escalate antibiotics as possible   Anion gap metabolic acidosis of unclear etiology, resolving -Unclear etiology, BUN 08Q at intake, salicylate acetaminophen and ethanol levels unremarkable, UDS positive for barbiturates only -Urine shows mild ketosis -Anion gap downtrending with IV fluids, etiology remains unspecified - potentially lactic in origin but labs not obtained at intake   DVT prophylaxis: Heparin Code Status: Full Family Communication: Husband at bedside  Status is: Inpatient  Dispo: The patient is from: Home              Anticipated d/c is to: To be determined              Anticipated d/c date is: 48 to 72 hours              Patient currently not medically stable for discharge  Consultants:  GI  Procedures:  Multiple attempts for barium swallow  Antimicrobials:  Ceftriaxone completed  Subjective: Patient continues to report episodes of "choking" but cannot recall the last thing she ate that caused issues, she does not recall swallow evaluation yesterday where they documented she had multiple episodes of odynophagia and dysphagia.  Review of systems limited but patient  otherwise denies headache fever chills chest pain.  Objective: Vitals:   10/05/21 0501 10/05/21 1438 10/05/21 2147  10/06/21 0524  BP: (!) 181/85 (!) 172/73 (!) 169/81 (!) 155/86  Pulse: 76 76 79 80  Resp: 20 19 19 18   Temp: 98 F (36.7 C) 99.2 F (37.3 C) 98.1 F (36.7 C) 98.6 F (37 C)  TempSrc: Oral Oral Oral Oral  SpO2: 100% 98% 98% 97%  Weight:      Height:        Intake/Output Summary (Last 24 hours) at 10/06/2021 0754 Last data filed at 10/06/2021 0133 Gross per 24 hour  Intake 1726.19 ml  Output 850 ml  Net 876.19 ml    Filed Weights   10/01/21 1231 10/01/21 2215  Weight: 66.4 kg 57.7 kg    Examination:  General:  Pleasantly resting in bed, No acute distress. HEENT:  Normocephalic atraumatic.  Sclerae nonicteric, noninjected.  Extraocular movements intact bilaterally. Neck:  Without mass or deformity.  Trachea is midline. Lungs:  Clear to auscultate bilaterally without rhonchi, wheeze, or rales. Heart:  Regular rate and rhythm.  Without murmurs, rubs, or gallops. Abdomen:  Soft, nontender, nondistended.  Without guarding or rebound. Extremities: Without cyanosis, clubbing, edema, or obvious deformity. Vascular:  Dorsalis pedis and posterior tibial pulses palpable bilaterally. Skin:  Warm and dry, no erythema, no ulcerations.  Data Reviewed: I have personally reviewed following labs and imaging studies  CBC: Recent Labs  Lab 10/01/21 1235 10/01/21 1734 10/03/21 0342 10/04/21 0333 10/05/21 0331 10/06/21 0342  WBC 11.4* 10.9* 8.5 7.8 8.2 7.7  NEUTROABS 9.9*  --   --   --   --   --   HGB 14.6 12.2 11.5* 12.0 11.7* 10.6*  HCT 43.7 37.2 34.8* 35.9* 34.1* 30.8*  MCV 90.3 90.5 90.2 89.3 89.3 87.7  PLT 278 187 188 190 178 601    Basic Metabolic Panel: Recent Labs  Lab 10/02/21 0338 10/03/21 0342 10/04/21 0333 10/05/21 0331 10/06/21 0342  NA 138 134* 137 140 140  K 2.8* 5.2* 3.7 3.7 3.0*  CL 101 102 103 105 108  CO2 20* 16* 17* 15* 16*  GLUCOSE 88 93 84 89 107*  BUN 28* 19 15 11 11   CREATININE 1.01* 1.01* 0.99 0.91 0.87  CALCIUM 8.2* 8.2* 8.5* 8.7* 8.5*     GFR: Estimated Creatinine Clearance: 42.7 mL/min (by C-G formula based on SCr of 0.87 mg/dL). Liver Function Tests: Recent Labs  Lab 10/02/21 0338 10/03/21 0342 10/04/21 0333 10/05/21 0331 10/06/21 0342  AST 20 21 24 23 22   ALT 17 16 19 18 19   ALKPHOS 60 56 59 58 53  BILITOT 1.7* 1.5* 1.4* 1.3* 1.2  PROT 5.5* 5.6* 5.9* 6.1* 5.6*  ALBUMIN 2.8* 2.8* 2.9* 3.0* 2.8*    Recent Labs  Lab 10/01/21 1235  LIPASE 50    No results for input(s): AMMONIA in the last 168 hours. Coagulation Profile: No results for input(s): INR, PROTIME in the last 168 hours. Cardiac Enzymes: No results for input(s): CKTOTAL, CKMB, CKMBINDEX, TROPONINI in the last 168 hours. BNP (last 3 results) No results for input(s): PROBNP in the last 8760 hours. HbA1C: No results for input(s): HGBA1C in the last 72 hours. CBG: No results for input(s): GLUCAP in the last 168 hours. Lipid Profile: No results for input(s): CHOL, HDL, LDLCALC, TRIG, CHOLHDL, LDLDIRECT in the last 72 hours. Thyroid Function Tests: No results for input(s): TSH, T4TOTAL, FREET4, T3FREE, THYROIDAB in the last 72 hours. Anemia Panel: No  results for input(s): VITAMINB12, FOLATE, FERRITIN, TIBC, IRON, RETICCTPCT in the last 72 hours. Sepsis Labs: No results for input(s): PROCALCITON, LATICACIDVEN in the last 168 hours.  Recent Results (from the past 240 hour(s))  Resp Panel by RT-PCR (Flu A&B, Covid) Nasopharyngeal Swab     Status: None   Collection Time: 10/01/21  1:43 PM   Specimen: Nasopharyngeal Swab; Nasopharyngeal(NP) swabs in vial transport medium  Result Value Ref Range Status   SARS Coronavirus 2 by RT PCR NEGATIVE NEGATIVE Final    Comment: (NOTE) SARS-CoV-2 target nucleic acids are NOT DETECTED.  The SARS-CoV-2 RNA is generally detectable in upper respiratory specimens during the acute phase of infection. The lowest concentration of SARS-CoV-2 viral copies this assay can detect is 138 copies/mL. A negative result  does not preclude SARS-Cov-2 infection and should not be used as the sole basis for treatment or other patient management decisions. A negative result may occur with  improper specimen collection/handling, submission of specimen other than nasopharyngeal swab, presence of viral mutation(s) within the areas targeted by this assay, and inadequate number of viral copies(<138 copies/mL). A negative result must be combined with clinical observations, patient history, and epidemiological information. The expected result is Negative.  Fact Sheet for Patients:  EntrepreneurPulse.com.au  Fact Sheet for Healthcare Providers:  IncredibleEmployment.be  This test is no t yet approved or cleared by the [place] FDA and  has been authorized for detection and/or diagnosis of SARS-CoV-2 by FDA under an Emergency Use Authorization (EUA). This EUA will remain  in effect (meaning this test can be used) for the duration of the COVID-19 declaration under Section 564(b)(1) of the Act, 21 U.S.C.section 360bbb-3(b)(1), unless the authorization is terminated  or revoked sooner.       Influenza A by PCR NEGATIVE NEGATIVE Final   Influenza B by PCR NEGATIVE NEGATIVE Final    Comment: (NOTE) The Xpert Xpress SARS-CoV-2/FLU/RSV plus assay is intended as an aid in the diagnosis of influenza from Nasopharyngeal swab specimens and should not be used as a sole basis for treatment. Nasal washings and aspirates are unacceptable for Xpert Xpress SARS-CoV-2/FLU/RSV testing.  Fact Sheet for Patients: EntrepreneurPulse.com.au  Fact Sheet for Healthcare Providers: IncredibleEmployment.be  This test is not yet approved or cleared by the [place] FDA and has been authorized for detection and/or diagnosis of SARS-CoV-2 by FDA under an Emergency Use Authorization (EUA). This EUA will remain in effect (meaning this test can be used) for  the duration of the COVID-19 declaration under Section 564(b)(1) of the Act, 21 U.S.C. section 360bbb-3(b)(1), unless the authorization is terminated or revoked.  Performed at [facility], [place] [address] [place], [place] [address]           Radiology Studies: DG Swallowing Func-Speech Pathology  Result Date: 10/05/2021 Table formatting from the original result was not included. 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[place])   Chronic kidney disease   Depression   Hyperlipidemia   Hypertension   Stroke [facility])  Past Surgical History: Past Surgical History: Procedure Laterality Date  ABDOMINAL HYSTERECTOMY    ARM SURGERY    CARDIAC CATHETERIZATION  2013  negative  EP IMPLANTABLE  DEVICE N/A 10/17/2015  Procedure: Loop Recorder Insertion;  Surgeon: Will Meredith Leeds, MD;  Location: [facility];  Service: Cardiovascular;  Laterality: N/A;  EYE SURGERY    FEMUR IM NAIL Right 10/20/2015  Procedure: INTRAMEDULLARY (IM) RETROGRADE FEMORAL NAILING;  Surgeon: Tania Ade, MD;  Location: [facility];  Service: Orthopedics;  Laterality: Right;  HEMORROIDECTOMY    KNEE SURGERY    TEE WITHOUT CARDIOVERSION N/A 10/17/2015  Procedure: TRANSESOPHAGEAL ECHOCARDIOGRAM (TEE);  Surgeon: Jerline Pain, MD;  Location: [facility] ENDOSCOPY;  Service: Cardiovascular;  Laterality: N/A; HPI: 80 y.o. female with medical history significant of hypertension, hyperlipidemia, CKD stage II, CVA, depression who presents with intractable nausea vomiting and poor p.o. intake with dehydration.  Spouse reports he brought her to the hospital because he could not  handle getting her to the bathroom anymore.  Per MD notes "Patient denies any sick contacts recent travel or change in dietary habits.   Swallow evaluation ordered due to pt's complaint of dysphagia - nausea/vomiting, globus.  Spouse reports since pt fell backward she has had difficulty swallowing.  He pointed to proximal esophagus to indicate region where pt's pills lodge. States pt consumes only mtn lion and water to take her pills and has not had a "meal" since her fall.  Subjective: patient awake, alert Assessment / Plan / Recommendation CHL IP CLINICAL IMPRESSIONS 10/05/2021 Clinical Impression Patient presents with a mild oropharyngeal dysphagia as per limited amount of barium tested. Patient consumed small to medium sip of thin liquid barium and then immediately started c/o nausea. She did not regurgitate anything and although she did settle down and say she thought she could try another sip, she then started retching again and c/o feeling unsteady while sitting and requesting something to "hold onto". SLP stopped MBS and radiology technician reclined her slightly then helped her back into bed. During the one swallow that she did perform, she exhibited brief holding of thin liquid barium in oral cavity, followed by a swallow initiation delay to level of vallecular sinus. No penetration or aspiration observed, no retrograde movement of bolus observed. UES opening was WNL and no retention of barium anywhere in pharynx, no residuals post swallow. SLP did not observe any signs of stricture and did not observe any impediment to thin liquid barium bolus while transiting in pharynx and through UES or upper portion of esophagus. SLP recommending continue with full liquids at this time. No further ST intervention warranted as patient should be able to advance with solids as tolerated. SLP Visit Diagnosis Dysphagia, oropharyngeal phase (R13.12) Attention and concentration deficit following -- Frontal lobe and executive  function deficit following -- Impact on safety and function Risk for inadequate nutrition/hydration;No limitations   CHL IP TREATMENT RECOMMENDATION 10/05/2021 Treatment Recommendations No treatment recommended at this time   Prognosis 10/05/2021 Prognosis for Safe Diet Advancement Good Barriers to Reach Goals -- Barriers/Prognosis Comment -- CHL IP DIET RECOMMENDATION 10/05/2021 SLP Diet Recommendations Thin liquid;Other (Comment) Liquid Administration via Cup;Straw Medication Administration Whole meds with puree Compensations Slow rate;Small sips/bites Postural Changes Seated upright at 90 degrees   CHL IP OTHER RECOMMENDATIONS 10/05/2021 Recommended Consults -- Oral Care Recommendations Oral care BID;Staff/trained caregiver to provide oral care Other Recommendations --   CHL IP FOLLOW UP RECOMMENDATIONS 10/05/2021 Follow up Recommendations None   CHL IP FREQUENCY AND DURATION 10/16/2015 Speech Therapy Frequency (ACUTE ONLY) min 2x/week Treatment Duration --      CHL IP ORAL PHASE 10/05/2021 Oral Phase Impaired Oral - Pudding Teaspoon --  Oral - Pudding Cup -- Oral - Honey Teaspoon -- Oral - Honey Cup -- Oral - Nectar Teaspoon -- Oral - Nectar Cup -- Oral - Nectar Straw -- Oral - Thin Teaspoon -- Oral - Thin Cup Holding of bolus Oral - Thin Straw -- Oral - Puree -- Oral - Mech Soft -- Oral - Regular -- Oral - Multi-Consistency -- Oral - Pill -- Oral Phase - Comment --  CHL IP PHARYNGEAL PHASE 10/05/2021 Pharyngeal Phase Impaired Pharyngeal- Pudding Teaspoon -- Pharyngeal -- Pharyngeal- Pudding Cup -- Pharyngeal -- Pharyngeal- Honey Teaspoon -- Pharyngeal -- Pharyngeal- Honey Cup -- Pharyngeal -- Pharyngeal- Nectar Teaspoon -- Pharyngeal -- Pharyngeal- Nectar Cup -- Pharyngeal -- Pharyngeal- Nectar Straw -- Pharyngeal -- Pharyngeal- Thin Teaspoon -- Pharyngeal -- Pharyngeal- Thin Cup Delayed swallow initiation-vallecula Pharyngeal -- Pharyngeal- Thin Straw -- Pharyngeal -- Pharyngeal- Puree -- Pharyngeal -- Pharyngeal-  Mechanical Soft -- Pharyngeal -- Pharyngeal- Regular -- Pharyngeal -- Pharyngeal- Multi-consistency -- Pharyngeal -- Pharyngeal- Pill -- Pharyngeal -- Pharyngeal Comment --  CHL IP CERVICAL ESOPHAGEAL PHASE 10/05/2021 Cervical Esophageal Phase WFL Pudding Teaspoon -- Pudding Cup -- Honey Teaspoon -- Honey Cup -- Nectar Teaspoon -- Nectar Cup -- Nectar Straw -- Thin Teaspoon -- Thin Cup -- Thin Straw -- Puree -- Mechanical Soft -- Regular -- Multi-consistency -- Pill -- Cervical Esophageal Comment -- Sonia Baller, MA, CCC-SLP Speech Therapy              Scheduled Meds:  buPROPion  150 mg Oral Daily   donepezil  10 mg Oral QHS   feeding supplement  237 mL Oral TID BM   FLUoxetine  40 mg Oral Daily   heparin  5,000 Units Subcutaneous Q8H   levothyroxine  50 mcg Oral Q0600   memantine  10 mg Oral BID   multivitamin with minerals  1 tablet Oral Daily   primidone  100 mg Oral Daily   And   primidone  50 mg Oral QHS   Continuous Infusions:  sodium chloride 10 mL/hr at 10/03/21 1041   lactated ringers 75 mL/hr at 10/06/21 0133     LOS: 4 days   Time spent: 38min  Kioni Stahl C Marji Kuehnel, DO [facility]  If 7PM-7AM, please contact night-coverage [email or web address]  10/06/2021, 7:54 AM

## 2021-10-06 NOTE — Progress Notes (Addendum)
Cleansed wound to nose 2x, band aid applied.  Pt had been removing dressing to nose and picking on it. Educated Pt and husband on the importance of keeping the dressing on and risks of picking/scratching the wound. Pt and husband at bedside state understanding.   Noted swelling to sternum, tender, no pain, no skin discoloration. MD made aware. CT ordered by MD. Pt and husband updated.

## 2021-10-07 ENCOUNTER — Inpatient Hospital Stay (HOSPITAL_COMMUNITY): Payer: Medicare HMO

## 2021-10-07 ENCOUNTER — Encounter (HOSPITAL_COMMUNITY): Payer: Self-pay | Admitting: Internal Medicine

## 2021-10-07 DIAGNOSIS — N390 Urinary tract infection, site not specified: Secondary | ICD-10-CM | POA: Diagnosis not present

## 2021-10-07 DIAGNOSIS — R1312 Dysphagia, oropharyngeal phase: Secondary | ICD-10-CM

## 2021-10-07 DIAGNOSIS — R6251 Failure to thrive (child): Secondary | ICD-10-CM

## 2021-10-07 DIAGNOSIS — N179 Acute kidney failure, unspecified: Secondary | ICD-10-CM | POA: Diagnosis not present

## 2021-10-07 LAB — COMPREHENSIVE METABOLIC PANEL
ALT: 18 U/L (ref 0–44)
AST: 25 U/L (ref 15–41)
Albumin: 2.6 g/dL — ABNORMAL LOW (ref 3.5–5.0)
Alkaline Phosphatase: 43 U/L (ref 38–126)
Anion gap: 14 (ref 5–15)
BUN: 14 mg/dL (ref 8–23)
CO2: 17 mmol/L — ABNORMAL LOW (ref 22–32)
Calcium: 8 mg/dL — ABNORMAL LOW (ref 8.9–10.3)
Chloride: 103 mmol/L (ref 98–111)
Creatinine, Ser: 0.74 mg/dL (ref 0.44–1.00)
GFR, Estimated: >60 mL/min (ref >60)
Glucose, Bld: 99 mg/dL (ref 70–99)
Potassium: 3.7 mmol/L (ref 3.5–5.1)
Sodium: 134 mmol/L — ABNORMAL LOW (ref 135–145)
Total Bilirubin: 1.4 mg/dL — ABNORMAL HIGH (ref 0.3–1.2)
Total Protein: 4.9 g/dL — ABNORMAL LOW (ref 6.5–8.1)

## 2021-10-07 LAB — CBC
HCT: 25.4 % — ABNORMAL LOW (ref 36.0–46.0)
Hemoglobin: 8.9 g/dL — ABNORMAL LOW (ref 12.0–15.0)
MCH: 30.5 pg (ref 26.0–34.0)
MCHC: 35 g/dL (ref 30.0–36.0)
MCV: 87 fL (ref 80.0–100.0)
Platelets: 148 10*3/uL — ABNORMAL LOW (ref 150–400)
RBC: 2.92 MIL/uL — ABNORMAL LOW (ref 3.87–5.11)
RDW: 15.8 % — ABNORMAL HIGH (ref 11.5–15.5)
WBC: 9.1 10*3/uL (ref 4.0–10.5)
nRBC: 0 % (ref 0.0–0.2)

## 2021-10-07 MED ORDER — PANTOPRAZOLE SODIUM 40 MG IV SOLR
40.0000 mg | Freq: Two times a day (BID) | INTRAVENOUS | Status: DC
  Administered 2021-10-07 – 2021-10-15 (×17): 40 mg via INTRAVENOUS
  Filled 2021-10-07 (×17): qty 40

## 2021-10-07 NOTE — Progress Notes (Signed)
[place] [address], Nicole Ruiz, 80. AKI. Has been having hematuria.  She Also c/o right Forearm pain and swelling.

## 2021-10-07 NOTE — Progress Notes (Addendum)
PROGRESS NOTE    Nicole Ruiz  NAT:[id number] DOB: [birth year] DOA: 10/01/2021 PCP: Bernerd Limbo, MD   Brief Narrative:  Nicole Ruiz is a 80 y.o. female with medical history significant of hypertension, hyperlipidemia, CKD stage II, depression who presents with intractable nausea vomiting and poor p.o. intake with dehydration over the past 3 to 4 days.  Patient denies any sick contacts recent travel or change in dietary habits.  Apparently some 4 days prior to admission she was having some mild nausea symptoms with poor p.o. intake which continued to worsen over the past 4 days now with intractable symptoms and abnormal labs as evaluated in the ED with elevated creatinine from baseline and an anion gap greater than 20. Hospitalist called to admit.    Assessment/Plan   Intractable nausea vomiting, unlikely infectious process, POA Dysphagia/odynophagia, ongoing -Unclear etiology -Patient and husband continue to be extremely poor historians, patient's intractable nausea and vomiting appear more to be in line with questionable obstructive process, husband indicates bypass difficulty swallowing more than intractable nausea and vomiting, previously infectious process less likely at this point -Continue supportive care Zofran and Phenergan IV fluids until p.o. intake improves -Multiple attempts for barium swallow marginally unsuccessful, see speech evaluation note as below without overt spillage or aspiration but patient able to only take minimal p.o. contrast -GI consulted for further insight recommendations, [facility] following we appreciate any assistance with this difficult and somewhat confusing case -CT neck unremarkable for any acute findings for odynophagia/dysphagia -we will order CT chest per GI recommendations to ensure no other overt causes of above.  Acute metabolic encephalopathy, POA, resolved On baseline dementia Rule out polypharmacy -Patient's mental status appears to be  approaching baseline -Baseline dementia suspected, unclear diagnosis -UDS positive for barbiturates -patient declines being on anything in this drug class, current home med list we have access to (again unclear if this is accurate) does not contain barbs...unclear how she is positive for this medication unless false positive in the setting of high NSAID use (this is not the case per patient/husband).  Right upper extremity hematoma/phlebitis -Ultrasound pending for further evaluation -Unclear if infiltrated IV medication, phlebitis, hematoma or less likely DVT  Mild acute kidney injury on CKD 2, resolved -Creatinine back to baseline -Continue LR until PO intake improves - continues to have issues swallowing as above  UTI, POA, resolved Mild hematuria -Continue ceftriaxone, continues to have bladder spasm and transient abdominal pain that is fleeting lasting only a few seconds -Continue to follow urine cultures, de-escalate antibiotics as possible -Mild hematuria started overnight on the 11th -continue to follow   Anion gap metabolic acidosis of unclear etiology, resolving -Unclear etiology, BUN 42H at intake, salicylate acetaminophen and ethanol levels unremarkable, UDS positive for barbiturates only -Urine shows mild ketosis -Anion gap downtrending with IV fluids, etiology remains unspecified - potentially lactic in origin but labs not obtained at intake   DVT prophylaxis: Heparin Code Status: Full Family Communication: Husband at bedside  Status is: Inpatient  Dispo: The patient is from: Home              Anticipated d/c is to: To be determined              Anticipated d/c date is: 48 to 72 hours              Patient currently not medically stable for discharge  Consultants:  GI  Procedures:  Multiple attempts for barium swallow  Antimicrobials:  Ceftriaxone completed  Subjective: Patient continues to report episodes of "choking" but cannot recall the last thing she ate  that caused issues, she does not recall swallow evaluation yesterday where they documented she had multiple episodes of odynophagia and dysphagia.  Review of systems limited but patient otherwise denies headache fever chills chest pain.  Objective: Vitals:   10/06/21 0831 10/06/21 1603 10/06/21 2217 10/07/21 0558  BP: (!) 163/66 (!) 153/82 134/67 114/73  Pulse: 78 81 81 81  Resp: 15 17 18 16   Temp: 98.4 F (36.9 C) 98.7 F (37.1 C) (!) 97.3 F (36.3 C) 98 F (36.7 C)  TempSrc: Oral Oral Oral Oral  SpO2: 98% 97% 96% 97%  Weight:      Height:        Intake/Output Summary (Last 24 hours) at 10/07/2021 0721 Last data filed at 10/07/2021 0700 Gross per 24 hour  Intake 3091.94 ml  Output 975 ml  Net 2116.94 ml    Filed Weights   10/01/21 1231 10/01/21 2215  Weight: 66.4 kg 57.7 kg    Examination:  General:  Pleasantly resting in bed, No acute distress. HEENT:  Normocephalic atraumatic.  Sclerae nonicteric, noninjected.  Extraocular movements intact bilaterally. Neck:  Without mass or deformity.  Trachea is midline. Lungs:  Clear to auscultate bilaterally without rhonchi, wheeze, or rales. Heart:  Regular rate and rhythm.  Without murmurs, rubs, or gallops. Abdomen:  Soft, nontender, nondistended.  Without guarding or rebound. Extremities: Without cyanosis, clubbing, edema, or obvious deformity. Vascular:  Dorsalis pedis and posterior tibial pulses palpable bilaterally. Skin:  Warm and dry, no erythema, no ulcerations.  Data Reviewed: I have personally reviewed following labs and imaging studies  CBC: Recent Labs  Lab 10/01/21 1235 10/01/21 1734 10/03/21 0342 10/04/21 0333 10/05/21 0331 10/06/21 0342 10/07/21 0323  WBC 11.4*   < > 8.5 7.8 8.2 7.7 9.1  NEUTROABS 9.9*  --   --   --   --   --   --   HGB 14.6   < > 11.5* 12.0 11.7* 10.6* 8.9*  HCT 43.7   < > 34.8* 35.9* 34.1* 30.8* 25.4*  MCV 90.3   < > 90.2 89.3 89.3 87.7 87.0  PLT 278   < > 188 190 178 171 148*    < > = values in this interval not displayed.    Basic Metabolic Panel: Recent Labs  Lab 10/03/21 0342 10/04/21 0333 10/05/21 0331 10/06/21 0342 10/07/21 0323  NA 134* 137 140 140 134*  K 5.2* 3.7 3.7 3.0* 3.7  CL 102 103 105 108 103  CO2 16* 17* 15* 16* 17*  GLUCOSE 93 84 89 107* 99  BUN 19 15 11 11 14   CREATININE 1.01* 0.99 0.91 0.87 0.74  CALCIUM 8.2* 8.5* 8.7* 8.5* 8.0*    GFR: Estimated Creatinine Clearance: 46.4 mL/min (by C-G formula based on SCr of 0.74 mg/dL). Liver Function Tests: Recent Labs  Lab 10/03/21 0342 10/04/21 0333 10/05/21 0331 10/06/21 0342 10/07/21 0323  AST 21 24 23 22 25   ALT 16 19 18 19 18   ALKPHOS 56 59 58 53 43  BILITOT 1.5* 1.4* 1.3* 1.2 1.4*  PROT 5.6* 5.9* 6.1* 5.6* 4.9*  ALBUMIN 2.8* 2.9* 3.0* 2.8* 2.6*    Recent Labs  Lab 10/01/21 1235  LIPASE 50    No results for input(s): AMMONIA in the last 168 hours. Coagulation Profile: No results for input(s): INR, PROTIME in the last 168 hours. Cardiac Enzymes: No results for input(s): CKTOTAL, CKMB, CKMBINDEX,  TROPONINI in the last 168 hours. BNP (last 3 results) No results for input(s): PROBNP in the last 8760 hours. HbA1C: No results for input(s): HGBA1C in the last 72 hours. CBG: No results for input(s): GLUCAP in the last 168 hours. Lipid Profile: No results for input(s): CHOL, HDL, LDLCALC, TRIG, CHOLHDL, LDLDIRECT in the last 72 hours. Thyroid Function Tests: No results for input(s): TSH, T4TOTAL, FREET4, T3FREE, THYROIDAB in the last 72 hours. Anemia Panel: No results for input(s): VITAMINB12, FOLATE, FERRITIN, TIBC, IRON, RETICCTPCT in the last 72 hours. Sepsis Labs: No results for input(s): PROCALCITON, LATICACIDVEN in the last 168 hours.  Recent Results (from the past 240 hour(s))  Resp Panel by RT-PCR (Flu A&B, Covid) Nasopharyngeal Swab     Status: None   Collection Time: 10/01/21  1:43 PM   Specimen: Nasopharyngeal Swab; Nasopharyngeal(NP) swabs in vial transport  medium  Result Value Ref Range Status   SARS Coronavirus 2 by RT PCR NEGATIVE NEGATIVE Final    Comment: (NOTE) SARS-CoV-2 target nucleic acids are NOT DETECTED.  The SARS-CoV-2 RNA is generally detectable in upper respiratory specimens during the acute phase of infection. The lowest concentration of SARS-CoV-2 viral copies this assay can detect is 138 copies/mL. A negative result does not preclude SARS-Cov-2 infection and should not be used as the sole basis for treatment or other patient management decisions. A negative result may occur with  improper specimen collection/handling, submission of specimen other than nasopharyngeal swab, presence of viral mutation(s) within the areas targeted by this assay, and inadequate number of viral copies(<138 copies/mL). A negative result must be combined with clinical observations, patient history, and epidemiological information. The expected result is Negative.  Fact Sheet for Patients:  EntrepreneurPulse.com.au  Fact Sheet for Healthcare Providers:  IncredibleEmployment.be  This test is no t yet approved or cleared by the [place] FDA and  has been authorized for detection and/or diagnosis of SARS-CoV-2 by FDA under an Emergency Use Authorization (EUA). This EUA will remain  in effect (meaning this test can be used) for the duration of the COVID-19 declaration under Section 564(b)(1) of the Act, 21 U.S.C.section 360bbb-3(b)(1), unless the authorization is terminated  or revoked sooner.       Influenza A by PCR NEGATIVE NEGATIVE Final   Influenza B by PCR NEGATIVE NEGATIVE Final    Comment: (NOTE) The Xpert Xpress SARS-CoV-2/FLU/RSV plus assay is intended as an aid in the diagnosis of influenza from Nasopharyngeal swab specimens and should not be used as a sole basis for treatment. Nasal washings and aspirates are unacceptable for Xpert Xpress SARS-CoV-2/FLU/RSV testing.  Fact Sheet for  Patients: EntrepreneurPulse.com.au  Fact Sheet for Healthcare Providers: IncredibleEmployment.be  This test is not yet approved or cleared by the [place] FDA and has been authorized for detection and/or diagnosis of SARS-CoV-2 by FDA under an Emergency Use Authorization (EUA). This EUA will remain in effect (meaning this test can be used) for the duration of the COVID-19 declaration under Section 564(b)(1) of the Act, 21 U.S.C. section 360bbb-3(b)(1), unless the authorization is terminated or revoked.  Performed at [facility], [place] [address], [place], [place] [address]           Radiology Studies: CT SOFT TISSUE NECK W CONTRAST  Result Date: 10/06/2021 CLINICAL DATA:  Cranial neuropathy, poor oral intake, trouble swallowing EXAM: CT NECK WITH CONTRAST TECHNIQUE: Multidetector CT imaging of the neck was performed using the standard protocol following the bolus administration of intravenous contrast. CONTRAST:  58mL  OMNIPAQUE IOHEXOL 350 MG/ML SOLN COMPARISON:  None. FINDINGS: Pharynx and larynx: Normal. No mass or swelling. Salivary glands: No inflammation, mass, or stone. Thyroid: Normal. Lymph nodes: No cervical lymphadenopathy. Prominent supraclavicular lymph nodes, which are not enlarged by CT size criteria and appear unchanged compared to March 2022. Vascular: Calcification at the bilateral carotid bifurcations. Multifocal irregularity in the bilateral subclavian veins, unchanged compared to the 03/01/2021 CTA Limited intracranial: Negative. Visualized orbits: Status post bilateral lens replacements. Mastoids and visualized paranasal sinuses: Clear. Skeleton: No acute or aggressive process. Upper chest: Negative. Other: None. IMPRESSION: No acute process in the neck. Electronically Signed   By: Merilyn Baba M.D.   On: 10/06/2021 16:23   DG Swallowing Func-Speech Pathology  Result Date: 10/05/2021 Table formatting from  the original result was not included. 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[place])   Chronic kidney disease   Depression   Hyperlipidemia   Hypertension   Stroke [facility])  Past Surgical History: Past Surgical History: Procedure Laterality Date  ABDOMINAL HYSTERECTOMY    ARM SURGERY    CARDIAC CATHETERIZATION  2013  negative  EP IMPLANTABLE DEVICE N/A 10/17/2015  Procedure: Loop Recorder Insertion;  Surgeon: Will Meredith Leeds, MD;  Location: [facility];  Service: Cardiovascular;  Laterality: N/A;  EYE SURGERY    FEMUR IM NAIL Right 10/20/2015  Procedure: INTRAMEDULLARY (IM) RETROGRADE FEMORAL NAILING;  Surgeon: Tania Ade, MD;  Location: [facility];  Service: Orthopedics;  Laterality: Right;  HEMORROIDECTOMY    KNEE SURGERY    TEE WITHOUT CARDIOVERSION N/A 10/17/2015  Procedure: TRANSESOPHAGEAL ECHOCARDIOGRAM (TEE);  Surgeon: Jerline Pain, MD;  Location: [facility];  Service: Cardiovascular;  Laterality: N/A; HPI: 80 y.o. female with medical history significant of hypertension, hyperlipidemia, CKD stage II, CVA, depression who presents with intractable nausea vomiting and poor p.o. intake with dehydration.  Spouse reports he brought her to the hospital because he could not handle getting her to the bathroom anymore.  Per MD notes "Patient denies any sick contacts recent travel or change in dietary habits.   Swallow evaluation ordered due to pt's complaint of dysphagia - nausea/vomiting, globus.  Spouse reports since pt fell backward she has had difficulty swallowing.  He pointed to proximal esophagus to indicate region where pt's pills lodge. States  pt consumes only mtn lion and water to take her pills and has not had a "meal" since her fall.  Subjective: patient awake, alert Assessment / Plan / Recommendation CHL IP CLINICAL IMPRESSIONS 10/05/2021 Clinical Impression Patient presents with a mild oropharyngeal dysphagia as per limited amount of barium tested. Patient consumed small to medium sip of thin liquid barium and then immediately started c/o nausea. She did not regurgitate anything and although she did settle down and say she thought she could try another sip, she then started retching again and c/o feeling unsteady while sitting and requesting something to "hold onto". SLP stopped MBS and radiology technician reclined her slightly then helped her back into bed. During the one swallow that she did perform, she exhibited brief holding of thin liquid barium in oral cavity, followed by a swallow initiation delay to level of vallecular sinus. No penetration or  aspiration observed, no retrograde movement of bolus observed. UES opening was WNL and no retention of barium anywhere in pharynx, no residuals post swallow. SLP did not observe any signs of stricture and did not observe any impediment to thin liquid barium bolus while transiting in pharynx and through UES or upper portion of esophagus. SLP recommending continue with full liquids at this time. No further ST intervention warranted as patient should be able to advance with solids as tolerated. SLP Visit Diagnosis Dysphagia, oropharyngeal phase (R13.12) Attention and concentration deficit following -- Frontal lobe and executive function deficit following -- Impact on safety and function Risk for inadequate nutrition/hydration;No limitations   CHL IP TREATMENT RECOMMENDATION 10/05/2021 Treatment Recommendations No treatment recommended at this time   Prognosis 10/05/2021 Prognosis for Safe Diet Advancement Good Barriers to Reach Goals -- Barriers/Prognosis Comment -- CHL IP DIET RECOMMENDATION 10/05/2021 SLP  Diet Recommendations Thin liquid;Other (Comment) Liquid Administration via Cup;Straw Medication Administration Whole meds with puree Compensations Slow rate;Small sips/bites Postural Changes Seated upright at 90 degrees   CHL IP OTHER RECOMMENDATIONS 10/05/2021 Recommended Consults -- Oral Care Recommendations Oral care BID;Staff/trained caregiver to provide oral care Other Recommendations --   CHL IP FOLLOW UP RECOMMENDATIONS 10/05/2021 Follow up Recommendations None   CHL IP FREQUENCY AND DURATION 10/16/2015 Speech Therapy Frequency (ACUTE ONLY) min 2x/week Treatment Duration --      CHL IP ORAL PHASE 10/05/2021 Oral Phase Impaired Oral - Pudding Teaspoon -- Oral - Pudding Cup -- Oral - Honey Teaspoon -- Oral - Honey Cup -- Oral - Nectar Teaspoon -- Oral - Nectar Cup -- Oral - Nectar Straw -- Oral - Thin Teaspoon -- Oral - Thin Cup Holding of bolus Oral - Thin Straw -- Oral - Puree -- Oral - Mech Soft -- Oral - Regular -- Oral - Multi-Consistency -- Oral - Pill -- Oral Phase - Comment --  CHL IP PHARYNGEAL PHASE 10/05/2021 Pharyngeal Phase Impaired Pharyngeal- Pudding Teaspoon -- Pharyngeal -- Pharyngeal- Pudding Cup -- Pharyngeal -- Pharyngeal- Honey Teaspoon -- Pharyngeal -- Pharyngeal- Honey Cup -- Pharyngeal -- Pharyngeal- Nectar Teaspoon -- Pharyngeal -- Pharyngeal- Nectar Cup -- Pharyngeal -- Pharyngeal- Nectar Straw -- Pharyngeal -- Pharyngeal- Thin Teaspoon -- Pharyngeal -- Pharyngeal- Thin Cup Delayed swallow initiation-vallecula Pharyngeal -- Pharyngeal- Thin Straw -- Pharyngeal -- Pharyngeal- Puree -- Pharyngeal -- Pharyngeal- Mechanical Soft -- Pharyngeal -- Pharyngeal- Regular -- Pharyngeal -- Pharyngeal- Multi-consistency -- Pharyngeal -- Pharyngeal- Pill -- Pharyngeal -- Pharyngeal Comment --  CHL IP CERVICAL ESOPHAGEAL PHASE 10/05/2021 Cervical Esophageal Phase WFL Pudding Teaspoon -- Pudding Cup -- Honey Teaspoon -- Honey Cup -- Nectar Teaspoon -- Nectar Cup -- Nectar Straw -- Thin Teaspoon -- Thin Cup  -- Thin Straw -- Puree -- Mechanical Soft -- Regular -- Multi-consistency -- Pill -- Cervical Esophageal Comment -- Sonia Baller, MA, CCC-SLP Speech Therapy              Scheduled Meds:  buPROPion  150 mg Oral Daily   donepezil  10 mg Oral QHS   feeding supplement  237 mL Oral TID BM   FLUoxetine  40 mg Oral Daily   heparin  5,000 Units Subcutaneous Q8H   levothyroxine  50 mcg Oral Q0600   memantine  10 mg Oral BID   multivitamin with minerals  1 tablet Oral Daily   primidone  100 mg Oral Daily   And   primidone  50 mg Oral QHS   Continuous Infusions:  sodium chloride 10 mL/hr at 10/03/21 1041  lactated ringers 75 mL/hr at 10/07/21 0659     LOS: 5 days   Time spent: 59min  Lloyd Cullinan C Misk Galentine, DO [facility]  If 7PM-7AM, please contact night-coverage [email or web address]  10/07/2021, 7:21 AM

## 2021-10-07 NOTE — Progress Notes (Signed)
Order to Hold heparin this am because of Hematuria by NP on call Gershon Cull.

## 2021-10-07 NOTE — Progress Notes (Addendum)
Patient ID: Nicole Ruiz, female   DOB: Jan 03, 1941, 80 y.o.   MRN: [id number]    Progress Note   Subjective  Day # 5  CC: N/V abd pain, dysphagia  Patient trying to take meds with applesauce with assistance from nurse, she is taking tiny bites, and complaining of burning in the back of her mouth and burning with swallowing.  She is also having some intermittent dry heaves but not vomiting.  She says she still having some discomfort in her abdomen, mostly in the right abdomen  Labs-WBC 9.1/hemoglobin 8.9/hematocrit 25.4/platelets 148 Creatinine 0.74/LFTs within normal limits  CT soft tissue neck-no acute process in the neck, prominent supraclavicular lymph nodes which are not enlarged by CT and stable since March 2022  CT abdomen pelvis without contrast 10/01/2021 age indeterminant T11 compression fracture new since March 2022 distal colonic diverticulosis no acute inflammation    Objective   Vital signs in last 24 hours: Temp:  [97.3 F (36.3 C)-98.7 F (37.1 C)] 98 F (36.7 C) (10/11 0558) Pulse Rate:  [81] 81 (10/11 0558) Resp:  [16-18] 16 (10/11 0558) BP: (114-153)/(67-82) 114/73 (10/11 0558) SpO2:  [96 %-97 %] 97 % (10/11 0558) Last BM Date: 10/04/21 General:   Elderly frail-appearing white female in NAD Heart:  Regular rate and rhythm; no murmurs Lungs: Respirations even and unlabored, lungs CTA bilaterally Abdomen:  Soft, there is some tenderness in the right mid and right upper abdomen no guarding or rebound, normal bowel sounds. Extremities: Right upper extremity very painful to move, probable hematoma below the midline IV Neurologic:  Alert and oriented,   Psych:  Cooperative. Normal mood and affect.  Intake/Output from previous day: 10/10 0701 - 10/11 0700 In: 3091.9 [P.O.:240; I.V.:2554.3; IV Piggyback:297.6] Out: 975 [Urine:975] Intake/Output this shift: No intake/output data recorded.  Lab Results: Recent Labs    10/05/21 0331 10/06/21 0342 10/07/21 0323   WBC 8.2 7.7 9.1  HGB 11.7* 10.6* 8.9*  HCT 34.1* 30.8* 25.4*  PLT 178 171 148*   BMET Recent Labs    10/05/21 0331 10/06/21 0342 10/07/21 0323  NA 140 140 134*  K 3.7 3.0* 3.7  CL 105 108 103  CO2 15* 16* 17*  GLUCOSE 89 107* 99  BUN 11 11 14   CREATININE 0.91 0.87 0.74  CALCIUM 8.7* 8.5* 8.0*   LFT Recent Labs    10/07/21 0323  PROT 4.9*  ALBUMIN 2.6*  AST 25  ALT 18  ALKPHOS 43  BILITOT 1.4*   PT/INR No results for input(s): LABPROT, INR in the last 72 hours.  Studies/Results: CT SOFT TISSUE NECK W CONTRAST  Result Date: 10/06/2021 CLINICAL DATA:  Cranial neuropathy, poor oral intake, trouble swallowing EXAM: CT NECK WITH CONTRAST TECHNIQUE: Multidetector CT imaging of the neck was performed using the standard protocol following the bolus administration of intravenous contrast. CONTRAST:  99mL OMNIPAQUE IOHEXOL 350 MG/ML SOLN COMPARISON:  None. FINDINGS: Pharynx and larynx: Normal. No mass or swelling. Salivary glands: No inflammation, mass, or stone. Thyroid: Normal. Lymph nodes: No cervical lymphadenopathy. Prominent supraclavicular lymph nodes, which are not enlarged by CT size criteria and appear unchanged compared to March 2022. Vascular: Calcification at the bilateral carotid bifurcations. Multifocal irregularity in the bilateral subclavian veins, unchanged compared to the 03/01/2021 CTA Limited intracranial: Negative. Visualized orbits: Status post bilateral lens replacements. Mastoids and visualized paranasal sinuses: Clear. Skeleton: No acute or aggressive process. Upper chest: Negative. Other: None. IMPRESSION: No acute process in the neck. Electronically Signed   By:  Merilyn Baba M.D.   On: 10/06/2021 16:23   DG Swallowing Func-Speech Pathology  Result Date: 10/05/2021 Table formatting from the original result was not included. 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[place])   Chronic kidney disease   Depression   Hyperlipidemia   Hypertension   Stroke [facility])  Past Surgical History: Past Surgical History: Procedure Laterality Date  ABDOMINAL HYSTERECTOMY    ARM SURGERY    CARDIAC CATHETERIZATION  2013  negative  EP IMPLANTABLE DEVICE N/A 10/17/2015  Procedure: Loop Recorder Insertion;  Surgeon: Will Meredith Leeds, MD;  Location: [facility];  Service: Cardiovascular;  Laterality: N/A;  EYE SURGERY    FEMUR IM NAIL Right 10/20/2015  Procedure: INTRAMEDULLARY (IM) RETROGRADE FEMORAL NAILING;  Surgeon: Tania Ade, MD;  Location: [facility];  Service: Orthopedics;  Laterality: Right;  HEMORROIDECTOMY    KNEE SURGERY    TEE WITHOUT CARDIOVERSION N/A 10/17/2015  Procedure: TRANSESOPHAGEAL ECHOCARDIOGRAM (TEE);  Surgeon: Jerline Pain, MD;  Location: [facility];  Service: Cardiovascular;  Laterality: N/A; HPI: 80 y.o. female with medical history significant of hypertension, hyperlipidemia, CKD stage II, CVA, depression who presents with intractable nausea vomiting and poor p.o. intake with dehydration.  Spouse reports he brought her to the hospital because he could not handle getting her to the bathroom anymore.  Per MD notes "Patient denies any sick contacts recent travel or change in dietary habits.   Swallow evaluation ordered due to pt's complaint of dysphagia - nausea/vomiting, globus.  Spouse reports since pt fell backward she has had difficulty swallowing.  He pointed to proximal esophagus to indicate region where pt's pills lodge. States pt consumes only mtn lion and water to take her pills and has not had a "meal" since her fall.  Subjective: patient awake, alert Assessment / Plan / Recommendation  CHL IP CLINICAL IMPRESSIONS 10/05/2021 Clinical Impression Patient presents with a mild oropharyngeal dysphagia as per limited amount of barium tested. Patient consumed small to medium sip of thin liquid barium and then immediately started c/o nausea. She did not regurgitate anything and although she did settle down and say she thought she could try another sip, she then started retching again and c/o feeling unsteady while sitting and requesting something to "hold onto". SLP stopped MBS and radiology technician reclined her slightly then helped her back into bed. During the one swallow that she did perform, she exhibited brief holding of thin liquid barium in oral cavity, followed by a swallow initiation delay to level of vallecular sinus. No penetration or aspiration observed, no retrograde movement of bolus observed. UES opening was WNL and no retention of barium anywhere in pharynx, no residuals post swallow. SLP did not observe any signs of stricture and did not observe any impediment to thin liquid barium bolus while transiting in pharynx and through UES or upper portion of esophagus. SLP recommending continue with full liquids at this time. No further ST intervention warranted as patient should be able to advance with solids as tolerated. SLP Visit Diagnosis Dysphagia, oropharyngeal phase (R13.12) Attention and concentration deficit following -- Frontal lobe and executive function deficit following -- Impact on safety and function Risk for inadequate  nutrition/hydration;No limitations   CHL IP TREATMENT RECOMMENDATION 10/05/2021 Treatment Recommendations No treatment recommended at this time   Prognosis 10/05/2021 Prognosis for Safe Diet Advancement Good Barriers to Reach Goals -- Barriers/Prognosis Comment -- CHL IP DIET RECOMMENDATION 10/05/2021 SLP Diet Recommendations Thin liquid;Other (Comment) Liquid Administration via Cup;Straw Medication Administration Whole meds with puree Compensations Slow rate;Small  sips/bites Postural Changes Seated upright at 90 degrees   CHL IP OTHER RECOMMENDATIONS 10/05/2021 Recommended Consults -- Oral Care Recommendations Oral care BID;Staff/trained caregiver to provide oral care Other Recommendations --   CHL IP FOLLOW UP RECOMMENDATIONS 10/05/2021 Follow up Recommendations None   CHL IP FREQUENCY AND DURATION 10/16/2015 Speech Therapy Frequency (ACUTE ONLY) min 2x/week Treatment Duration --      CHL IP ORAL PHASE 10/05/2021 Oral Phase Impaired Oral - Pudding Teaspoon -- Oral - Pudding Cup -- Oral - Honey Teaspoon -- Oral - Honey Cup -- Oral - Nectar Teaspoon -- Oral - Nectar Cup -- Oral - Nectar Straw -- Oral - Thin Teaspoon -- Oral - Thin Cup Holding of bolus Oral - Thin Straw -- Oral - Puree -- Oral - Mech Soft -- Oral - Regular -- Oral - Multi-Consistency -- Oral - Pill -- Oral Phase - Comment --  CHL IP PHARYNGEAL PHASE 10/05/2021 Pharyngeal Phase Impaired Pharyngeal- Pudding Teaspoon -- Pharyngeal -- Pharyngeal- Pudding Cup -- Pharyngeal -- Pharyngeal- Honey Teaspoon -- Pharyngeal -- Pharyngeal- Honey Cup -- Pharyngeal -- Pharyngeal- Nectar Teaspoon -- Pharyngeal -- Pharyngeal- Nectar Cup -- Pharyngeal -- Pharyngeal- Nectar Straw -- Pharyngeal -- Pharyngeal- Thin Teaspoon -- Pharyngeal -- Pharyngeal- Thin Cup Delayed swallow initiation-vallecula Pharyngeal -- Pharyngeal- Thin Straw -- Pharyngeal -- Pharyngeal- Puree -- Pharyngeal -- Pharyngeal- Mechanical Soft -- Pharyngeal -- Pharyngeal- Regular -- Pharyngeal -- Pharyngeal- Multi-consistency -- Pharyngeal -- Pharyngeal- Pill -- Pharyngeal -- Pharyngeal Comment --  CHL IP CERVICAL ESOPHAGEAL PHASE 10/05/2021 Cervical Esophageal Phase WFL Pudding Teaspoon -- Pudding Cup -- Honey Teaspoon -- Honey Cup -- Nectar Teaspoon -- Nectar Cup -- Nectar Straw -- Thin Teaspoon -- Thin Cup -- Thin Straw -- Puree -- Mechanical Soft -- Regular -- Multi-consistency -- Pill -- Cervical Esophageal Comment -- Sonia Baller, MA, CCC-SLP Speech Therapy                  Assessment / Plan:    #57 80 year old white female admitted with nausea vomiting abdominal pain and complaints of dysphagia/odynophagia Etiology not clear, patient is a poor historian CT soft tissue of the neck negative Patient unable to complete a barium swallow Speech path eval as above no penetration or aspiration, UES normal no signs of stricture noted by speech pathologist CT of the head on admit no acute intracranial findings  She may have an acute esophagitis, question infectious, dysphagia may be multifactoral   #2 acute kidney injury on chronic kidney disease stage III #3 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 Henrene Pastor on Thursday, 10/09/2021, afternoon.  We will discussed with patient and her husband tomorrow.    Active Problems:   AKI (acute kidney injury) ([place])  UTI (urinary tract infection)     LOS: 5 days   Perina Salvaggio  PA-C10/10/2021, 8:53 AM  GI ATTENDING  History, laboratories, x-rays reviewed.  Patient seen and examined.  Agree with comprehensive consultation note as outlined above.  80 year old female with multiple significant medical problems.  Vague dysphagia.  Poor historian.  Unable to cooperate with diagnostic studies.  Agree with ongoing work-up and supportive measures.  If unable to perform appropriate noninvasive swallowing evaluations, may need endoscopy for clarification.  She is high risk.  Docia Chuck. Geri Seminole., M.D. [facility] Division of  Gastroenterology

## 2021-10-07 NOTE — Progress Notes (Signed)
PT Cancellation Note  Patient Details Name: CARLYON NOLASCO MRN: [id number] DOB: 1941/04/04   Cancelled Treatment:    Reason Eval/Treat Not Completed: Medical issues which prohibited therapy. Spoke with RN. Pt with hematuria and R UE hematoma-awaiting ultrasound. Will hold PT on today and will check back on tomorrow.     [facility]  Office: [phone] Pager: [phone]

## 2021-10-08 ENCOUNTER — Inpatient Hospital Stay (HOSPITAL_COMMUNITY): Payer: Medicare HMO

## 2021-10-08 DIAGNOSIS — N179 Acute kidney failure, unspecified: Secondary | ICD-10-CM | POA: Diagnosis not present

## 2021-10-08 DIAGNOSIS — E860 Dehydration: Secondary | ICD-10-CM | POA: Diagnosis not present

## 2021-10-08 DIAGNOSIS — D529 Folate deficiency anemia, unspecified: Secondary | ICD-10-CM | POA: Diagnosis not present

## 2021-10-08 DIAGNOSIS — R1312 Dysphagia, oropharyngeal phase: Secondary | ICD-10-CM

## 2021-10-08 DIAGNOSIS — D649 Anemia, unspecified: Secondary | ICD-10-CM | POA: Clinically undetermined

## 2021-10-08 DIAGNOSIS — F32A Depression, unspecified: Secondary | ICD-10-CM

## 2021-10-08 DIAGNOSIS — E039 Hypothyroidism, unspecified: Secondary | ICD-10-CM

## 2021-10-08 DIAGNOSIS — R112 Nausea with vomiting, unspecified: Secondary | ICD-10-CM | POA: Diagnosis not present

## 2021-10-08 DIAGNOSIS — K219 Gastro-esophageal reflux disease without esophagitis: Secondary | ICD-10-CM

## 2021-10-08 DIAGNOSIS — R319 Hematuria, unspecified: Secondary | ICD-10-CM | POA: Clinically undetermined

## 2021-10-08 LAB — BASIC METABOLIC PANEL
Anion gap: 13 (ref 5–15)
BUN: 13 mg/dL (ref 8–23)
CO2: 18 mmol/L — ABNORMAL LOW (ref 22–32)
Calcium: 8.1 mg/dL — ABNORMAL LOW (ref 8.9–10.3)
Chloride: 104 mmol/L (ref 98–111)
Creatinine, Ser: 0.71 mg/dL (ref 0.44–1.00)
GFR, Estimated: >60 mL/min (ref >60)
Glucose, Bld: 106 mg/dL — ABNORMAL HIGH (ref 70–99)
Potassium: 3.4 mmol/L — ABNORMAL LOW (ref 3.5–5.1)
Sodium: 135 mmol/L (ref 135–145)

## 2021-10-08 LAB — ABO/RH: ABO/RH(D): A POS

## 2021-10-08 LAB — URINALYSIS, ROUTINE W REFLEX MICROSCOPIC
Bacteria, UA: NONE SEEN
Bilirubin Urine: NEGATIVE
Glucose, UA: NEGATIVE mg/dL
Ketones, ur: 20 mg/dL — AB
Leukocytes,Ua: NEGATIVE
Nitrite: NEGATIVE
Protein, ur: 100 mg/dL — AB
RBC / HPF: >50 RBC/hpf — ABNORMAL HIGH (ref 0–5)
RBC / HPF: >50 RBC/hpf — ABNORMAL HIGH (ref 0–5)
Specific Gravity, Urine: 1.014 (ref 1.005–1.030)
pH: 6 (ref 5.0–8.0)

## 2021-10-08 LAB — CBC
HCT: 21.1 % — ABNORMAL LOW (ref 36.0–46.0)
Hemoglobin: 7.3 g/dL — ABNORMAL LOW (ref 12.0–15.0)
MCH: 30.4 pg (ref 26.0–34.0)
MCHC: 34.6 g/dL (ref 30.0–36.0)
MCV: 87.9 fL (ref 80.0–100.0)
Platelets: 136 10*3/uL — ABNORMAL LOW (ref 150–400)
RBC: 2.4 MIL/uL — ABNORMAL LOW (ref 3.87–5.11)
RDW: 15.7 % — ABNORMAL HIGH (ref 11.5–15.5)
WBC: 8.1 10*3/uL (ref 4.0–10.5)
nRBC: 0 % (ref 0.0–0.2)

## 2021-10-08 LAB — IRON AND TIBC
Iron: 24 ug/dL — ABNORMAL LOW (ref 28–170)
Saturation Ratios: 19 % (ref 10.4–31.8)
TIBC: 125 ug/dL — ABNORMAL LOW (ref 250–450)
UIBC: 101 ug/dL

## 2021-10-08 LAB — FERRITIN: Ferritin: 209 ng/mL (ref 11–307)

## 2021-10-08 LAB — HEMOGLOBIN AND HEMATOCRIT, BLOOD
HCT: 17.5 % — ABNORMAL LOW (ref 36.0–46.0)
Hemoglobin: 6 g/dL — CL (ref 12.0–15.0)

## 2021-10-08 LAB — PREALBUMIN: Prealbumin: 7 mg/dL — ABNORMAL LOW (ref 18–38)

## 2021-10-08 LAB — FOLATE: Folate: 2.5 ng/mL — ABNORMAL LOW (ref >5.9)

## 2021-10-08 LAB — PREPARE RBC (CROSSMATCH)

## 2021-10-08 LAB — MAGNESIUM: Magnesium: 1.4 mg/dL — ABNORMAL LOW (ref 1.7–2.4)

## 2021-10-08 LAB — VITAMIN B12: Vitamin B-12: 1874 pg/mL — ABNORMAL HIGH (ref 180–914)

## 2021-10-08 MED ORDER — SODIUM CHLORIDE 0.9% FLUSH
10.0000 mL | Freq: Two times a day (BID) | INTRAVENOUS | Status: DC
  Administered 2021-10-10: 10 mL
  Administered 2021-10-10: 30 mL
  Administered 2021-10-14: 10 mL

## 2021-10-08 MED ORDER — POTASSIUM CHLORIDE 20 MEQ PO PACK
40.0000 meq | PACK | Freq: Once | ORAL | Status: DC
  Filled 2021-10-08: qty 2

## 2021-10-08 MED ORDER — POTASSIUM CHLORIDE 10 MEQ/100ML IV SOLN: 10.0000 meq | INTRAVENOUS | Status: DC

## 2021-10-08 MED ORDER — GADOBUTROL 1 MMOL/ML IV SOLN
6.0000 mL | Freq: Once | INTRAVENOUS | Status: AC | PRN
  Administered 2021-10-08: 6 mL via INTRAVENOUS

## 2021-10-08 MED ORDER — MAGNESIUM SULFATE 4 GM/100ML IV SOLN
4.0000 g | Freq: Once | INTRAVENOUS | Status: AC
  Administered 2021-10-08: 4 g via INTRAVENOUS
  Filled 2021-10-08: qty 100

## 2021-10-08 MED ORDER — ACETAMINOPHEN 325 MG PO TABS
650.0000 mg | ORAL_TABLET | ORAL | Status: DC | PRN
  Administered 2021-10-15: 650 mg via ORAL
  Filled 2021-10-08: qty 2

## 2021-10-08 MED ORDER — FOLIC ACID 1 MG PO TABS
1.0000 mg | ORAL_TABLET | Freq: Every day | ORAL | Status: DC
  Administered 2021-10-10: 1 mg via ORAL
  Filled 2021-10-08: qty 1

## 2021-10-08 MED ORDER — LORAZEPAM 0.5 MG PO TABS
0.5000 mg | ORAL_TABLET | Freq: Once | ORAL | Status: AC
  Administered 2021-10-08: 0.5 mg via ORAL
  Filled 2021-10-08: qty 1

## 2021-10-08 MED ORDER — FUROSEMIDE 10 MG/ML IJ SOLN
20.0000 mg | Freq: Once | INTRAMUSCULAR | Status: AC
  Administered 2021-10-08: 20 mg via INTRAVENOUS
  Filled 2021-10-08: qty 2

## 2021-10-08 MED ORDER — SODIUM CHLORIDE 0.9% IV SOLUTION: Freq: Once | INTRAVENOUS | Status: AC

## 2021-10-08 MED ORDER — DIPHENHYDRAMINE HCL 50 MG/ML IJ SOLN
12.5000 mg | Freq: Once | INTRAMUSCULAR | Status: DC
  Filled 2021-10-08: qty 1

## 2021-10-08 MED ORDER — SODIUM CHLORIDE 0.9% FLUSH: 10.0000 mL | INTRAVENOUS | Status: DC | PRN

## 2021-10-08 MED ORDER — CHLORHEXIDINE GLUCONATE CLOTH 2 % EX PADS
6.0000 | MEDICATED_PAD | Freq: Every day | CUTANEOUS | Status: DC
  Administered 2021-10-08 – 2021-10-15 (×8): 6 via TOPICAL

## 2021-10-08 MED ORDER — ACETAMINOPHEN 650 MG RE SUPP
325.0000 mg | Freq: Once | RECTAL | Status: AC
  Administered 2021-10-08: 325 mg via RECTAL
  Filled 2021-10-08: qty 1

## 2021-10-08 NOTE — Progress Notes (Signed)
Patient retaining 528ml of urine in bladder per bladder scanner. Pt unable to void. New order for In and out cath as needed. Will continue to assess pt.

## 2021-10-08 NOTE — Progress Notes (Signed)
Critical result  Hgb: 6.0  Dr. Grandville Silos paged. New orders.

## 2021-10-08 NOTE — Progress Notes (Signed)
PROGRESS NOTE    Nicole Ruiz  DXA:[id number] DOB: 1941-06-26 DOA: 10/01/2021 PCP: Bernerd Limbo, MD    No chief complaint on file.   Brief Narrative:  Nicole Ruiz is a 80 y.o. female with medical history significant of hypertension, hyperlipidemia, CKD stage II, depression who presents with intractable nausea vomiting and poor p.o. intake with dehydration over the past 3 to 4 days.  Patient denies any sick contacts recent travel or change in dietary habits.  Apparently some 4 days prior to admission she was having some mild nausea symptoms with poor p.o. intake which continued to worsen over the past 4 days now with intractable symptoms and abnormal labs as evaluated in the ED with elevated creatinine from baseline and an anion gap greater than 20. Hospitalist called to admit. Patient also with complaints of dysphagia, multiple attempts of barium swallow done unsuccessful, GI consulted and patient for probable upper endoscopy tomorrow 10/09/2021.  Patient also noted to have right upper extremity with concerns for cystic fluid collection.  MRI upper extremity pending.   Assessment & Plan:   Principal Problem:   Nausea and vomiting Active Problems:   Esophageal reflux   Clinical depression   Hypothyroid   AKI (acute kidney injury) ([place])   UTI (urinary tract infection)   Oropharyngeal dysphagia   Failure to thrive (child)   Anemia   Hematuria  #1 intractable nausea vomiting, unlikely infectious process, POA/dysphagia/odynophagia, ongoing -Questionable etiology. -Patient noted to have UTI on presentation and received 3 days of IV Rocephin. -Patient and husband poor historians, patient's intractable nausea vomiting concerning for possible obstructive process and is noted per husband that patient has had difficulty swallowing for over 4 weeks with poor oral intake. -Multiple attempts at barium swallow unsuccessful, see speech evaluation note as below without any overt spillage or  aspiration patient able to only take minimal oral contrast. -CT neck unremarkable for any acute findings of odynophagia/dysphagia. -CT chest with new subacute appearing fracture of the manubrium body with developing callus formation, mild superior endplate compression fractures T1 and T2 new from 03/01/2021 but also likely subacute, mild superior endplate compression fracture T11 again seen without progressive height loss from prior CT [address] 22.  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  Mild prominence of the right and left main pulmonary arteries which can be seen with pulmonary arterial hypertension.  Aortic and coronary artery atherosclerosis. -Patient seen in consultation by GI and patient started on PPI twice daily and scheduled for EGD tomorrow 10/09/2021. -Appreciate GI input and recommendations.  2.  Hematuria -Questionable etiology. -Repeat UA with cultures and sensitivities. -Status post 3 days IV Rocephin. -Check a CT renal stone protocol. -Urology consult. -Follow.  3.  Iron deficiency anemia /folate deficiency/acute blood loss anemia -Could likely be secondary to hematuria.  Patient with no overt GI bleed. -Patient seen in consultation by GI and patient for upper endoscopy tomorrow. -Continue PPI twice daily. -Anemia panel with iron deficiency anemia and folate deficiency with folate of 2.5, iron of 24, TIBC of 125. -Repeat H&H this afternoon. -Placed on folic acid 1 mg daily. -Transfusion threshold hemoglobin < 7.  4.  Acute metabolic encephalopathy, POA resolved/baseline dementia -Patient's mental status noted to be close to baseline. -Baseline dementia suspected, unclear diagnosis. -UDS was positive for barbiturates, patient declines being on anything in this drug class, current home med list unclear  whether  accurate does not contain barbiturates, unclear how she is positive for this medication list false positive in the setting of high NSAID use.  5.  Right upper extremity hematoma/phlebitis -Ultrasound right upper extremity done concerning for complex cystic lesions along striated surrounding tissues may represent muscles in the region of concern, possibilities may include hematoma, abscess or centrally necrotic mass.  MRI of upper extremity pending.  6.  Mild acute kidney injury on chronic kidney disease stage II, resolved -Renal function back to baseline. -Monitor with hydration.  7.  UTI, POA resolved -Urine cultures not obtained on admission. -Patient is status post 3 days IV Rocephin. -Repeat UA with cultures and sensitivities. -Patient now with hematuria consult with urology.  8.  Anion gap metabolic acidosis of unclear etiology, resolving -Unclear etiology. -BUN in the 30s on admission. -Salicylate, acetaminophen, ethanol levels unremarkable. -UDS positive for barbiturates only. -Urine had mild ketosis. -Improved with hydration. -Placed on bicarb tablets.  9.  Hypomagnesemia -Magnesium sulfate 4 g IV x1. -Repeat magnesium levels in the a.m.  10.  Hypothyroidism -Continue home dose Synthroid.  11.??  Dementia -Continue home regimen Namenda, Aricept.  12.  Depression Wellbutrin.    DVT prophylaxis: SCDs Code Status: Full Family Communication: Updated patient and husband at bedside. Disposition:   Status is: Inpatient  Remains inpatient appropriate because:Inpatient level of care appropriate due to severity of illness  Dispo: The patient is from: Home              Anticipated d/c is to:  TBD              Patient currently is not medically stable to d/c.   Difficult to place patient No       Consultants:  Gastroenterology: Dr. Henrene Pastor 10/06/2021  Procedures:  CT renal stone protocol pending 10/08/2021 CT chest without contrast 10/07/2021 CT soft tissue  neck 10/06/2021 CT head 10/01/2021 Chest x-ray 10/01/2021 MBS 10/05/2021 MRI humerus pending 10/08/2021 Ultrasound right upper extremity soft tissue 10/07/2021 Multiple attempts at barium swallow unsuccessful  Antimicrobials:  IV Rocephin 10/02/2021>>>>> 10/04/2021    Subjective: Patient laying in bed.  States still feelings of food getting stuck in the throat.  Poor oral intake, husband at bedside.  Per husband patient with poor oral intake for the past 4 weeks.  Patient denies any chest pain.  Some pain in right upper extremity.  Husband at bedside.  Patient noted to have some hematuria.  Objective: Vitals:   10/07/21 1429 10/08/21 0028 10/08/21 0501 10/08/21 1341  BP: (!) 146/78 (!) 153/60 (!) 155/82 (!) 146/67  Pulse: 81 87 85 81  Resp: 16  16 18   Temp: 98.2 F (36.8 C) 98 F (36.7 C) 97.6 F (36.4 C) 97.7 F (36.5 C)  TempSrc: Oral Oral Oral Oral  SpO2: 95% 97% 96% 97%  Weight:      Height:        Intake/Output Summary (Last 24 hours) at 10/08/2021 1756 Last data filed at 10/08/2021 1737 Gross per 24 hour  Intake 2231.64 ml  Output 550 ml  Net 1681.64 ml   Filed Weights   10/01/21 1231 10/01/21 2215  Weight: 66.4 kg 57.7 kg    Examination:  General exam: Appears calm and comfortable  Respiratory system: Clear to auscultation. Respiratory effort normal. Cardiovascular system: S1 & S2 heard, RRR. No JVD, murmurs, rubs, gallops or clicks. No pedal edema. Gastrointestinal system: Abdomen is nondistended, soft and nontender. No organomegaly or masses felt. Normal bowel sounds heard.  Central nervous system: Alert and oriented. No focal neurological deficits. Extremities: Right upper extremity with midline with some swelling in the antecubital area with some redness, some induration and tenderness to palpation.  Skin: No rashes, lesions or ulcers Psychiatry: Judgement and insight appear normal. Mood & affect appropriate.     Data Reviewed: I have personally reviewed  following labs and imaging studies  CBC: Recent Labs  Lab 10/04/21 0333 10/05/21 0331 10/06/21 0342 10/07/21 0323 10/08/21 0427 10/08/21 1553  WBC 7.8 8.2 7.7 9.1 8.1  --   HGB 12.0 11.7* 10.6* 8.9* 7.3* 6.0*  HCT 35.9* 34.1* 30.8* 25.4* 21.1* 17.5*  MCV 89.3 89.3 87.7 87.0 87.9  --   PLT 190 178 171 148* 136*  --     Basic Metabolic Panel: Recent Labs  Lab 10/04/21 0333 10/05/21 0331 10/06/21 0342 10/07/21 0323 10/08/21 0427 10/08/21 0848  NA 137 140 140 134* 135  --   K 3.7 3.7 3.0* 3.7 3.4*  --   CL 103 105 108 103 104  --   CO2 17* 15* 16* 17* 18*  --   GLUCOSE 84 89 107* 99 106*  --   BUN 15 11 11 14 13   --   CREATININE 0.99 0.91 0.87 0.74 0.71  --   CALCIUM 8.5* 8.7* 8.5* 8.0* 8.1*  --   MG  --   --   --   --   --  1.4*    GFR: Estimated Creatinine Clearance: 46.4 mL/min (by C-G formula based on SCr of 0.71 mg/dL).  Liver Function Tests: Recent Labs  Lab 10/03/21 0342 10/04/21 0333 10/05/21 0331 10/06/21 0342 10/07/21 0323  AST 21 24 23 22 25   ALT 16 19 18 19 18   ALKPHOS 56 59 58 53 43  BILITOT 1.5* 1.4* 1.3* 1.2 1.4*  PROT 5.6* 5.9* 6.1* 5.6* 4.9*  ALBUMIN 2.8* 2.9* 3.0* 2.8* 2.6*    CBG: No results for input(s): GLUCAP in the last 168 hours.   Recent Results (from the past 240 hour(s))  Resp Panel by RT-PCR (Flu A&B, Covid) Nasopharyngeal Swab     Status: None   Collection Time: 10/01/21  1:43 PM   Specimen: Nasopharyngeal Swab; Nasopharyngeal(NP) swabs in vial transport medium  Result Value Ref Range Status   SARS Coronavirus 2 by RT PCR NEGATIVE NEGATIVE Final    Comment: (NOTE) SARS-CoV-2 target nucleic acids are NOT DETECTED.  The SARS-CoV-2 RNA is generally detectable in upper respiratory specimens during the acute phase of infection. The lowest concentration of SARS-CoV-2 viral copies this assay can detect is 138 copies/mL. A negative result does not preclude SARS-Cov-2 infection and should not be used as the sole basis for  treatment or other patient management decisions. A negative result may occur with  improper specimen collection/handling, submission of specimen other than nasopharyngeal swab, presence of viral mutation(s) within the areas targeted by this assay, and inadequate number of viral copies(<138 copies/mL). A negative result must be combined with clinical observations, patient history, and epidemiological information. The expected result is Negative.  Fact Sheet for Patients:  EntrepreneurPulse.com.au  Fact Sheet for Healthcare Providers:  IncredibleEmployment.be  This test is no t yet approved or cleared by the [place] FDA and  has been authorized for detection and/or diagnosis of SARS-CoV-2 by FDA under an Emergency Use Authorization (EUA). This EUA will remain  in effect (meaning this test can be used) for the duration of the COVID-19 declaration under Section 564(b)(1) of the Act, 21  U.S.C.section 360bbb-3(b)(1), unless the authorization is terminated  or revoked sooner.       Influenza A by PCR NEGATIVE NEGATIVE Final   Influenza B by PCR NEGATIVE NEGATIVE Final    Comment: (NOTE) The Xpert Xpress SARS-CoV-2/FLU/RSV plus assay is intended as an aid in the diagnosis of influenza from Nasopharyngeal swab specimens and should not be used as a sole basis for treatment. Nasal washings and aspirates are unacceptable for Xpert Xpress SARS-CoV-2/FLU/RSV testing.  Fact Sheet for Patients: EntrepreneurPulse.com.au  Fact Sheet for Healthcare Providers: IncredibleEmployment.be  This test is not yet approved or cleared by the [place] FDA and has been authorized for detection and/or diagnosis of SARS-CoV-2 by FDA under an Emergency Use Authorization (EUA). This EUA will remain in effect (meaning this test can be used) for the duration of the COVID-19 declaration under Section 564(b)(1) of the Act, 21  U.S.C. section 360bbb-3(b)(1), unless the authorization is terminated or revoked.  Performed at [facility], [place] [address] [place], [place] [address]          Radiology Studies: CT CHEST WO CONTRAST  Result Date: 10/07/2021 CLINICAL DATA:  Chest pain, cough, shortness of breath. Calcification or ossification, muscle EXAM: CT CHEST WITHOUT CONTRAST TECHNIQUE: Multidetector CT imaging of the chest was performed following the standard protocol without IV contrast. COMPARISON:  CT 03/01/2021. Chest x-ray 10/01/2021. CT abdomen 10/01/2021 FINDINGS: Cardiovascular: Heart size within normal limits. No pericardial effusion. Thoracic aorta is nonaneurysmal. Scattered atherosclerotic calcifications of the aorta and coronary arteries. Mild prominence of the right and left main pulmonary arteries. Mediastinum/Nodes: No enlarged mediastinal or axillary lymph nodes. Thyroid gland, trachea, and esophagus demonstrate no significant findings. Lungs/Pleura: Scattered foci of centrilobular ground-glass nodularity seen within both lungs may be minimally improved compared to CT 03/01/2021. No new focal airspace consolidation. Minimal bibasilar scarring or atelectasis. No pleural effusion or pneumothorax. Upper Abdomen: Layering hyperdense fluid within the gallbladder lumen is likely related to vicarious excretion of IV contrast from recent CT scan. No acute findings within the included upper abdomen. Unchanged appearance of the bilateral adrenal glands and visualized kidneys. Musculoskeletal: New subacute appearing fracture of the manubrial body with developing callus formation (series 6, image 84). Mild superior endplate compression fractures of T1 and T2 are new from 03/01/2021. Mild superior endplate compression fracture of T11 is again seen without progressive height loss from previous CT 10/01/2021. No chest wall hematoma or other abnormality. A loop recorder device is noted within the left  chest wall. IMPRESSION: 1. New subacute-appearing fracture of the manubrial body with developing callus formation. 2. Mild superior endplate compression fractures of T1 and T2 are new from 03/01/2021, but also likely subacute. 3. Mild superior endplate compression fracture of T11 is again seen without progressive height loss from previous CT 10/01/2021. 4. Scattered foci of centrilobular ground-glass nodularity within both lungs may be minimally improved compared to CT 03/01/2021. Findings may reflect a chronic atypical infectious or inflammatory bronchiolitis. 5. Mild prominence of the right and left main pulmonary arteries, which can be seen with pulmonary arterial hypertension. 6. Aortic and coronary artery atherosclerosis (ICD10-I70.0). Electronically Signed   By: Davina Poke D.O.   On: 10/07/2021 15:15   [place] RT UPPER EXTREM LTD SOFT TISSUE NON VASCULAR  Result Date: 10/07/2021 CLINICAL DATA:  Arm pain in the antecubital fossa EXAM: ULTRASOUND RIGHT UPPER EXTREMITY LIMITED TECHNIQUE: Ultrasound examination of the upper extremity soft tissues was performed in the area of clinical concern. COMPARISON:  None. FINDINGS: In  the region of concern we demonstrate a 7.2 by 2.3 by 4.2 cm (volume = 36 cm^3) complex fluid density lesion which is possibly intramuscular or least has a thick surrounding rind. There is also an adjacent more superficial 2.1 by 0.6 by 3.6 cm (volume = 2 cm^3) collection likewise with a surrounding soft tissues which may be muscular tissues. Possibilities may include mass, infection, or hematoma. Correlate clinically and consider MRI for further workup if clinically warranted. IMPRESSION: 1. Complex cystic lesions along striated surrounding tissues which may represent muscles in the region of concern. Possibilities might include hematoma, abscess, or centrally necrotic mass. If the cause is not obvious clinically, consider MRI. Electronically Signed   By: Van Clines M.D.   On:  10/07/2021 20:02   CT RENAL STONE STUDY  Result Date: 10/08/2021 CLINICAL DATA:  Hematuria.  Right flank pain. EXAM: CT ABDOMEN AND PELVIS WITHOUT CONTRAST TECHNIQUE: Multidetector CT imaging of the abdomen and pelvis was performed following the standard protocol without IV contrast. COMPARISON:  CT abdomen pelvis 05/16/2007 report without imaging, CT abdomen pelvis 10/01/2021 FINDINGS: Lower chest: Bilateral lower lobe subsegmental atelectasis. Cardiac findings suggestive of anemia. Hepatobiliary: No focal liver abnormality. Layering hyperdensity within the gallbladder lumen suggestive gallstones. No gallbladder wall thickening or pericholecystic fluid. No biliary dilatation. Pancreas: No focal lesion. Normal pancreatic contour. No surrounding inflammatory changes. No main pancreatic ductal dilatation. Spleen: Normal in size without focal abnormality. A splenule is noted. Adrenals/Urinary Tract: No adrenal nodule bilaterally. Bilateral renal cortical scarring. No nephrolithiasis and no hydronephrosis. There is a 4.1 cm fluid density lesion within the left kidney that likely represents a simple renal cyst. Interval development of a 2 x 1.6 x 2.6 cm hyperdense heterogeneous lesion within the right kidney. No ureterolithiasis or hydroureter. Layering hyperdensity within the urinary bladder lumen likely related to excreted intravenous contrast from recently administered IV contrast on 10/06/2021 for CT neck. Stomach/Bowel: Stomach is within normal limits. No evidence of bowel wall thickening or dilatation. Diffuse sigmoid diverticulosis. The appendix not definitely identified. Vascular/Lymphatic: No abdominal aorta or iliac aneurysm. Severe atherosclerotic plaque of the aorta and its branches. No abdominal, pelvic, or inguinal lymphadenopathy. Reproductive: Status post hysterectomy. No adnexal masses. Other: No intraperitoneal free fluid. No intraperitoneal free gas. No organized fluid collection. Musculoskeletal:  No abdominal wall hernia or abnormality. No suspicious lytic or blastic osseous lesions. No acute displaced fracture. In stray shin of an age-indeterminate mild T11 compression fracture. Surgical hardware of the proximal right femoral shaft. IMPRESSION: 1. Interval development of a 2 x 1.6 x 2.6 cm hyperdensity within the right kidney that may represent blood products/renal cast (new compared to 10/01/21). 2. Cholelithiasis. 3. Colonic diverticulosis with no acute diverticulitis. 4. Otherwise limited evaluation on this noncontrast study. Electronically Signed   By: Iven Finn M.D.   On: 10/08/2021 16:09        Scheduled Meds:  buPROPion  150 mg Oral Daily   Chlorhexidine Gluconate Cloth  6 each Topical Daily   diphenhydrAMINE  12.5 mg Intravenous Once   donepezil  10 mg Oral QHS   feeding supplement  237 mL Oral TID BM   FLUoxetine  40 mg Oral Daily   folic acid  1 mg Oral Daily   furosemide  20 mg Intravenous Once   levothyroxine  50 mcg Oral Q0600   memantine  10 mg Oral BID   multivitamin with minerals  1 tablet Oral Daily   pantoprazole (PROTONIX) IV  40 mg Intravenous Q12H  potassium chloride  40 mEq Oral Once   primidone  100 mg Oral Daily   And   primidone  50 mg Oral QHS   sodium chloride flush  10-40 mL Intracatheter Q12H   Continuous Infusions:  sodium chloride 10 mL/hr at 10/03/21 1041   lactated ringers 75 mL/hr at 10/07/21 2144     LOS: 6 days    Time spent: 40 minutes    Irine Seal, MD [facility]   To contact the attending provider between 7A-7P or the covering provider during after hours 7P-7A, please log into the web site [email or web address] and access using universal [place] password for that web site. If you do not have the password, please call the hospital operator.  10/08/2021, 5:56 PM

## 2021-10-08 NOTE — Progress Notes (Signed)
OT Cancellation Note  Patient Details Name: Nicole Ruiz MRN: [id number] DOB: 1941/04/20   Cancelled Treatment:    Reason Eval/Treat Not Completed: Patient declined, no reason specified patient reporting " I want to go home". When asked birth date patient attempted to give number with fingers v.s. speaking. Patient reported she did not have enough strength to speak. Husband attempted to encourage to participate as well after education with patient continuing to report I want to go home.    [name] OTR/L, [facility] Office# [phone] Pager# [phone]    10/08/2021, 9:42 AM

## 2021-10-08 NOTE — Progress Notes (Addendum)
Patient ID: Nicole Ruiz, female   DOB: 03-Jun-1941, 80 y.o.   MRN: [id number]    Progress Note   Subjective   Day # 6  CC; dysphagia, odynophagia ,nausea vomiting  Labs today-WBC 8.1, hemoglobin 7.3/hematocrit 21.1 Potassium 3.4/creatinine 0.7  CT chest yesterday-new subacute appearing fracture of the manubrial body, mild endplate compression fractures T1 and T2 new from 03/01/2021, mild T11 compression fracture scattered foci of groundglass nodularity in both lungs minimally improved compared to 03/01/2021 that are atypical infectious or inflammatory bronchiolitis possible mild pulmonary arterial hypertension Ultrasound soft tissue right upper extremity 7.2 x 2.3 x 4.2 cm complex fluid density possibly intramuscular  Has been at bedside, patient a bit more interactive today, still not eating.  She says she wants to go home.  She does not give me a direct answer to odynophagia nausea or abdominal pain but while I was talking about her esophagus she says food wants to"get stuck"   Objective   Vital signs in last 24 hours: Temp:  [97.6 F (36.4 C)-98.2 F (36.8 C)] 97.6 F (36.4 C) (10/12 0501) Pulse Rate:  [81-87] 85 (10/12 0501) Resp:  [16] 16 (10/12 0501) BP: (146-155)/(60-82) 155/82 (10/12 0501) SpO2:  [95 %-97 %] 96 % (10/12 0501) Last BM Date: 10/04/21 General: Elderly frail-appearing   white female in NAD keeps eyes closed while answering questions poor historian Heart:  Regular rate and rhythm; no murmurs Lungs: Respirations even and unlabored, lungs CTA bilaterally, mild bruising and swelling over the manubrium Abdomen:  Soft, no focal tenderness ,nondistended. Normal bowel sounds. Extremities:  Without edema. Neurologic:  Alert and oriented Psych:  Cooperative. Normal mood and affect.  Intake/Output from previous day: 10/11 0701 - 10/12 0700 In: 1690.6 [I.V.:1690.6] Out: 450 [Urine:450] Intake/Output this shift: No intake/output data recorded.  Lab Results: Recent  Labs    10/06/21 0342 10/07/21 0323 10/08/21 0427  WBC 7.7 9.1 8.1  HGB 10.6* 8.9* 7.3*  HCT 30.8* 25.4* 21.1*  PLT 171 148* 136*   BMET Recent Labs    10/06/21 0342 10/07/21 0323 10/08/21 0427  NA 140 134* 135  K 3.0* 3.7 3.4*  CL 108 103 104  CO2 16* 17* 18*  GLUCOSE 107* 99 106*  BUN 11 14 13   CREATININE 0.87 0.74 0.71  CALCIUM 8.5* 8.0* 8.1*   LFT Recent Labs    10/07/21 0323  PROT 4.9*  ALBUMIN 2.6*  AST 25  ALT 18  ALKPHOS 43  BILITOT 1.4*   PT/INR No results for input(s): LABPROT, INR in the last 72 hours.  Studies/Results: CT SOFT TISSUE NECK W CONTRAST  Result Date: 10/06/2021 CLINICAL DATA:  Cranial neuropathy, poor oral intake, trouble swallowing EXAM: CT NECK WITH CONTRAST TECHNIQUE: Multidetector CT imaging of the neck was performed using the standard protocol following the bolus administration of intravenous contrast. CONTRAST:  73mL OMNIPAQUE IOHEXOL 350 MG/ML SOLN COMPARISON:  None. FINDINGS: Pharynx and larynx: Normal. No mass or swelling. Salivary glands: No inflammation, mass, or stone. Thyroid: Normal. Lymph nodes: No cervical lymphadenopathy. Prominent supraclavicular lymph nodes, which are not enlarged by CT size criteria and appear unchanged compared to March 2022. Vascular: Calcification at the bilateral carotid bifurcations. Multifocal irregularity in the bilateral subclavian veins, unchanged compared to the 03/01/2021 CTA Limited intracranial: Negative. Visualized orbits: Status post bilateral lens replacements. Mastoids and visualized paranasal sinuses: Clear. Skeleton: No acute or aggressive process. Upper chest: Negative. Other: None. IMPRESSION: No acute process in the neck. Electronically Signed   By: Bryson Ha  Vasan M.D.   On: 10/06/2021 16:23   CT CHEST WO CONTRAST  Result Date: 10/07/2021 CLINICAL DATA:  Chest pain, cough, shortness of breath. Calcification or ossification, muscle EXAM: CT CHEST WITHOUT CONTRAST TECHNIQUE: Multidetector  CT imaging of the chest was performed following the standard protocol without IV contrast. COMPARISON:  CT 03/01/2021. Chest x-ray 10/01/2021. CT abdomen 10/01/2021 FINDINGS: Cardiovascular: Heart size within normal limits. No pericardial effusion. Thoracic aorta is nonaneurysmal. Scattered atherosclerotic calcifications of the aorta and coronary arteries. Mild prominence of the right and left main pulmonary arteries. Mediastinum/Nodes: No enlarged mediastinal or axillary lymph nodes. Thyroid gland, trachea, and esophagus demonstrate no significant findings. Lungs/Pleura: Scattered foci of centrilobular ground-glass nodularity seen within both lungs may be minimally improved compared to CT 03/01/2021. No new focal airspace consolidation. Minimal bibasilar scarring or atelectasis. No pleural effusion or pneumothorax. Upper Abdomen: Layering hyperdense fluid within the gallbladder lumen is likely related to vicarious excretion of IV contrast from recent CT scan. No acute findings within the included upper abdomen. Unchanged appearance of the bilateral adrenal glands and visualized kidneys. Musculoskeletal: New subacute appearing fracture of the manubrial body with developing callus formation (series 6, image 84). Mild superior endplate compression fractures of T1 and T2 are new from 03/01/2021. Mild superior endplate compression fracture of T11 is again seen without progressive height loss from previous CT 10/01/2021. No chest wall hematoma or other abnormality. A loop recorder device is noted within the left chest wall. IMPRESSION: 1. New subacute-appearing fracture of the manubrial body with developing callus formation. 2. Mild superior endplate compression fractures of T1 and T2 are new from 03/01/2021, but also likely subacute. 3. Mild superior endplate compression fracture of T11 is again seen without progressive height loss from previous CT 10/01/2021. 4. Scattered foci of centrilobular ground-glass nodularity  within both lungs may be minimally improved compared to CT 03/01/2021. Findings may reflect a chronic atypical infectious or inflammatory bronchiolitis. 5. Mild prominence of the right and left main pulmonary arteries, which can be seen with pulmonary arterial hypertension. 6. Aortic and coronary artery atherosclerosis (ICD10-I70.0). Electronically Signed   By: Davina Poke D.O.   On: 10/07/2021 15:15   [place] RT UPPER EXTREM LTD SOFT TISSUE NON VASCULAR  Result Date: 10/07/2021 CLINICAL DATA:  Arm pain in the antecubital fossa EXAM: ULTRASOUND RIGHT UPPER EXTREMITY LIMITED TECHNIQUE: Ultrasound examination of the upper extremity soft tissues was performed in the area of clinical concern. COMPARISON:  None. FINDINGS: In the region of concern we demonstrate a 7.2 by 2.3 by 4.2 cm (volume = 36 cm^3) complex fluid density lesion which is possibly intramuscular or least has a thick surrounding rind. There is also an adjacent more superficial 2.1 by 0.6 by 3.6 cm (volume = 2 cm^3) collection likewise with a surrounding soft tissues which may be muscular tissues. Possibilities may include mass, infection, or hematoma. Correlate clinically and consider MRI for further workup if clinically warranted. IMPRESSION: 1. Complex cystic lesions along striated surrounding tissues which may represent muscles in the region of concern. Possibilities might include hematoma, abscess, or centrally necrotic mass. If the cause is not obvious clinically, consider MRI. Electronically Signed   By: Van Clines M.D.   On: 10/07/2021 20:02       Assessment / Plan:    #65 80 year old white female admitted 6 days ago with progressive weakness, complaints of nausea, vomiting at home and poor p.o. intake and associated acute kidney injury Patient has had persistent inability to take p.o.'s, other than  tiny bites of applesauce with her meds. 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 Henrene Pastor tomorrow/midday.  Procedure was discussed with the patient and her husband today, husband will sign permit. Further plans pending findings at EGD Check prealbumin Need to consider alternative means of nutrition i.e. TNA, or possible placement of feeding tube if EGD otherwise unremarkable I do not think she will tolerate an enteral feeding tube and will pull out.   Active Problems:   AKI (acute kidney injury) ([place])   UTI (urinary tract infection)   Oropharyngeal dysphagia   Failure to thrive (child)   Anemia    LOS: 6 days   Amy Esterwood PA-C 10/08/2021, 8:55 AM  GI ATTENDING  Interval history data reviewed.  Patient seen and examined.  Agree with interval history as outlined above in the extensive progress note.  No additions or deletions.  Agree with plans for upper endoscopy to sort out multiple vague upper GI complaints.  The patient is high risk given her age and comorbidities.The nature of the procedure, as well as the risks, benefits, and alternatives were carefully and thoroughly  reviewed with the patient. Ample time for discussion and questions allowed. The patient understood, was satisfied, and agreed to proceed.   Docia Chuck. Geri Seminole., M.D. [facility] of Gastroenterology

## 2021-10-08 NOTE — Progress Notes (Signed)
RE: bruised, swelling, and c/o of discomfort on RUE. Patient has RUA midline placed 10/6, it flushed easily with GBR. Patient didn't c/o of discomfort while flushing midline. RN made aware. Will continue to monitor.

## 2021-10-08 NOTE — Progress Notes (Signed)
Patient transferred from 3W. Pt alert upon arrival not appearing to be in any distress. Vitals stables on RA. Midline intact. Belongings at bedside. Will continue to assess pt.

## 2021-10-08 NOTE — Progress Notes (Signed)
PT Cancellation Note  Patient Details Name: Nicole Ruiz MRN: [id number] DOB: 1941-03-20   Cancelled Treatment:    Reason Eval/Treat Not Completed: Patient at procedure or test/unavailable. Spouse reports pt just returned from MRI-had to be medicated for test. Pt is too drowsy/lethargic to attempt eval. Will check back on tomorrow.    [facility]  Office: [phone] Pager: [phone]

## 2021-10-08 NOTE — Consult Note (Signed)
Urology Consult  Referring physician: Cline Cools Reason for referral: hematuria  Chief Complaint: hematuria  History of Present Illness: I was consulted to assess the patient for gross hematuria and low hemoglobin.  She was admitted with dehydration and nausea and vomiting for 3 to 4 days.  She was having trouble to swallow and could not do a barium swallow.  She is scheduled for endoscopy tomorrow by gastroenterology.  She was treated for urinary tract infection when she presented.  Apparently there were no cultures of urine when admitted.  She was having ongoing hematuria and her hemoglobin decreased to 6.  She did not have a gastroenterology cause of bleeding at this point.  She does have baseline dementia.  Patient initially had elevated serum creatinine but her renal function returned to baseline.  Her subcu heparin has been stopped.   Serum creatinine is 0.71.  Hemoglobin was 7.3 and 3 days ago was 11.7.  Yesterday it was 8.9.  Her last hemoglobin today was 6.0 and she is getting 2 units of blood.  Hemodynamically she has been stable.  A 14 French Foley catheter was inserted.  She had a CT stone protocol and likely has a 4 cm cyst in left kidney.  She has a new 2 x 1.6 x 2.6 cm hyperdense heterogeneous lesion within the right kidney.  No stones or hydroureter.  She had layering hyperdensity within the bladder possibly from intravenous contrast from recently administered IV contrast on October 10 for her neck.  It was felt that the hyperdense area in the right kidney may represent blood products and was new since October 01, 2021.  She was using a pure wick this morning and apparently there was red blood with some clots.  A 14 French catheter has been placed and irrigated for no clots.  Is been draining well.  On physical examination patient did not provide a history.  She was quite sleepy.  No distention or tenderness of lower abdomen.  Foley is draining well with good output of dark almost  Kool-Aid like urine.  No clots.    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[place])    Chronic kidney disease    Depression    Hyperlipidemia    Hypertension    Stroke [facility])    Past Surgical History:  Procedure Laterality Date   ABDOMINAL HYSTERECTOMY     ARM SURGERY     CARDIAC CATHETERIZATION  2013   negative   EP IMPLANTABLE DEVICE N/A 10/17/2015   Procedure: Loop Recorder Insertion;  Surgeon: Will Meredith Leeds, MD;  Location: [place] [facility];  Service: Cardiovascular;  Laterality: N/A;   EYE SURGERY     FEMUR IM NAIL Right 10/20/2015   Procedure: INTRAMEDULLARY (IM) RETROGRADE FEMORAL NAILING;  Surgeon: Tania Ade, MD;  Location: [facility];  Service: Orthopedics;  Laterality: Right;   HEMORROIDECTOMY     KNEE SURGERY     TEE WITHOUT CARDIOVERSION N/A 10/17/2015   Procedure: TRANSESOPHAGEAL ECHOCARDIOGRAM (TEE);  Surgeon: Jerline Pain, MD;  Location[facility] ENDOSCOPY;  Service: Cardiovascular;  Laterality: N/A;    Medications: I have reviewed the patient's current medications. Allergies: No Known Allergies  Family History  Problem Relation Age of Onset   Heart disease Mother 29   Stroke Father    Heart disease Brother 70   Social History:  reports that she has never smoked. She has never used smokeless tobacco. She reports that she does not drink  alcohol and does not use drugs.  ROS: All systems are reviewed and negative except as noted. Rest negative  Physical Exam:  Vital signs in last 24 hours: Temp:  [97.6 F (36.4 C)-98 F (36.7 C)] 97.7 F (36.5 C) (10/12 1341) Pulse Rate:  [81-87] 81 (10/12 1341) Resp:  [16-18] 18 (10/12 1341) BP: (146-155)/(60-82) 146/67 (10/12 1341) SpO2:  [96 %-97 %] 97 % (10/12 1341)  Cardiovascular: Skin warm; not flushed Respiratory: Breaths quiet; no shortness of breath Abdomen: No masses Neurological: Normal sensation to touch Musculoskeletal: Normal motor function arms and  legs Lymphatics: No inguinal adenopathy Skin: No rashes Genitourinary:rest   Laboratory Data:  Results for orders placed or performed during the hospital encounter of 10/01/21 (from the past 72 hour(s))  CBC     Status: Abnormal   Collection Time: 10/06/21  3:42 AM  Result Value Ref Range   WBC 7.7 4.0 - 10.5 K/uL   RBC 3.51 (L) 3.87 - 5.11 MIL/uL   Hemoglobin 10.6 (L) 12.0 - 15.0 g/dL   HCT 30.8 (L) 36.0 - 46.0 %   MCV 87.7 80.0 - 100.0 fL   MCH 30.2 26.0 - 34.0 pg   MCHC 34.4 30.0 - 36.0 g/dL   RDW 15.5 11.5 - 15.5 %   Platelets 171 150 - 400 K/uL   nRBC 0.0 0.0 - 0.2 %    Comment: Performed at [facility], [place] [address], [place], [place] [address]  Comprehensive metabolic panel     Status: Abnormal   Collection Time: 10/06/21  3:42 AM  Result Value Ref Range   Sodium 140 135 - 145 mmol/L   Potassium 3.0 (L) 3.5 - 5.1 mmol/L    Comment: DELTA CHECK NOTED   Chloride 108 98 - 111 mmol/L   CO2 16 (L) 22 - 32 mmol/L   Glucose, Bld 107 (H) 70 - 99 mg/dL    Comment: Glucose reference range applies only to samples taken after fasting for at least 8 hours.   BUN 11 8 - 23 mg/dL   Creatinine, Ser 0.87 0.44 - 1.00 mg/dL   Calcium 8.5 (L) 8.9 - 10.3 mg/dL   Total Protein 5.6 (L) 6.5 - 8.1 g/dL   Albumin 2.8 (L) 3.5 - 5.0 g/dL   AST 22 15 - 41 U/L   ALT 19 0 - 44 U/L   Alkaline Phosphatase 53 38 - 126 U/L   Total Bilirubin 1.2 0.3 - 1.2 mg/dL   GFR, Estimated >60 >60 mL/min    Comment: (NOTE) Calculated using the CKD-EPI Creatinine Equation (2021)    Anion gap 16 (H) 5 - 15    Comment: Performed at [facility], [place] [address] [place], [place] [address]  CBC     Status: Abnormal   Collection Time: 10/07/21  3:23 AM  Result Value Ref Range   WBC 9.1 4.0 - 10.5 K/uL   RBC 2.92 (L) 3.87 - 5.11 MIL/uL   Hemoglobin 8.9 (L) 12.0 - 15.0 g/dL   HCT 25.4 (L) 36.0 - 46.0 %   MCV 87.0 80.0 - 100.0 fL   MCH 30.5 26.0 - 34.0 pg   MCHC 35.0 30.0  - 36.0 g/dL   RDW 15.8 (H) 11.5 - 15.5 %   Platelets 148 (L) 150 - 400 K/uL   nRBC 0.0 0.0 - 0.2 %    Comment: Performed at [facility], [place] [address], [place], [place] [address]  Comprehensive metabolic panel  Status: Abnormal   Collection Time: 10/07/21  3:23 AM  Result Value Ref Range   Sodium 134 (L) 135 - 145 mmol/L   Potassium 3.7 3.5 - 5.1 mmol/L    Comment: DELTA CHECK NOTED   Chloride 103 98 - 111 mmol/L   CO2 17 (L) 22 - 32 mmol/L   Glucose, Bld 99 70 - 99 mg/dL    Comment: Glucose reference range applies only to samples taken after fasting for at least 8 hours.   BUN 14 8 - 23 mg/dL   Creatinine, Ser 0.74 0.44 - 1.00 mg/dL   Calcium 8.0 (L) 8.9 - 10.3 mg/dL   Total Protein 4.9 (L) 6.5 - 8.1 g/dL   Albumin 2.6 (L) 3.5 - 5.0 g/dL   AST 25 15 - 41 U/L   ALT 18 0 - 44 U/L   Alkaline Phosphatase 43 38 - 126 U/L   Total Bilirubin 1.4 (H) 0.3 - 1.2 mg/dL   GFR, Estimated >60 >60 mL/min    Comment: (NOTE) Calculated using the CKD-EPI Creatinine Equation (2021)    Anion gap 14 5 - 15    Comment: Performed at [facility], [place] [address], [place], [place] [address]  CBC     Status: Abnormal   Collection Time: 10/08/21  4:27 AM  Result Value Ref Range   WBC 8.1 4.0 - 10.5 K/uL   RBC 2.40 (L) 3.87 - 5.11 MIL/uL   Hemoglobin 7.3 (L) 12.0 - 15.0 g/dL   HCT 21.1 (L) 36.0 - 46.0 %   MCV 87.9 80.0 - 100.0 fL   MCH 30.4 26.0 - 34.0 pg   MCHC 34.6 30.0 - 36.0 g/dL   RDW 15.7 (H) 11.5 - 15.5 %   Platelets 136 (L) 150 - 400 K/uL   nRBC 0.0 0.0 - 0.2 %    Comment: Performed at [facility], [place] [address] [place], [place] [address]  Basic metabolic panel     Status: Abnormal   Collection Time: 10/08/21  4:27 AM  Result Value Ref Range   Sodium 135 135 - 145 mmol/L   Potassium 3.4 (L) 3.5 - 5.1 mmol/L   Chloride 104 98 - 111 mmol/L   CO2 18 (L) 22 - 32 mmol/L   Glucose, Bld 106 (H) 70 - 99 mg/dL    Comment: Glucose  reference range applies only to samples taken after fasting for at least 8 hours.   BUN 13 8 - 23 mg/dL   Creatinine, Ser 0.71 0.44 - 1.00 mg/dL   Calcium 8.1 (L) 8.9 - 10.3 mg/dL   GFR, Estimated >60 >60 mL/min    Comment: (NOTE) Calculated using the CKD-EPI Creatinine Equation (2021)    Anion gap 13 5 - 15    Comment: Performed at [facility], [place] [address], [place], [place] [address]  Vitamin B12     Status: Abnormal   Collection Time: 10/08/21  8:48 AM  Result Value Ref Range   Vitamin B-12 1,874 (H) 180 - 914 pg/mL    Comment: RESULTS CONFIRMED BY MANUAL DILUTION (NOTE) This assay is not validated for testing neonatal or myeloproliferative syndrome specimens for Vitamin B12 levels. Performed at [facility], [place] [address] [place], [place] [address]   Folate     Status: Abnormal   Collection Time: 10/08/21  8:48 AM  Result Value Ref Range   Folate 2.5 (L) >5.9 ng/mL    Comment: Performed at [facility],  [place] [address] [place], [place] [address]  Iron and TIBC     Status: Abnormal   Collection Time: 10/08/21  8:48 AM  Result Value Ref Range   Iron 24 (L) 28 - 170 ug/dL   TIBC 125 (L) 250 - 450 ug/dL   Saturation Ratios 19 10.4 - 31.8 %   UIBC 101 ug/dL    Comment: Performed at [facility], [place] [address], [place], [place] [address]  Ferritin     Status: None   Collection Time: 10/08/21  8:48 AM  Result Value Ref Range   Ferritin 209 11 - 307 ng/mL    Comment: Performed at [facility], [place] [address], [place], [place] [address]  Magnesium     Status: Abnormal   Collection Time: 10/08/21  8:48 AM  Result Value Ref Range   Magnesium 1.4 (L) 1.7 - 2.4 mg/dL    Comment: Performed at [facility], [place] [address] [place], [place] [address]  Prealbumin     Status: Abnormal   Collection Time: 10/08/21  8:48 AM  Result Value Ref Range   Prealbumin 7.0 (L)  18 - 38 mg/dL    Comment: Performed at [facility], [place] [address], [place], [place] [address]  Urinalysis, Routine w reflex microscopic Urine, In & Out Cath     Status: Abnormal   Collection Time: 10/08/21 10:19 AM  Result Value Ref Range   Color, Urine RED (A) YELLOW    Comment: BIOCHEMICALS MAY BE AFFECTED BY COLOR   APPearance TURBID (A) CLEAR   Specific Gravity, Urine  1.005 - 1.030    TEST NOT REPORTED DUE TO COLOR INTERFERENCE OF URINE PIGMENT   pH  5.0 - 8.0    TEST NOT REPORTED DUE TO COLOR INTERFERENCE OF URINE PIGMENT   Glucose, UA (A) NEGATIVE mg/dL    TEST NOT REPORTED DUE TO COLOR INTERFERENCE OF URINE PIGMENT   Hgb urine dipstick (A) NEGATIVE    TEST NOT REPORTED DUE TO COLOR INTERFERENCE OF URINE PIGMENT   Bilirubin Urine (A) NEGATIVE    TEST NOT REPORTED DUE TO COLOR INTERFERENCE OF URINE PIGMENT   Ketones, ur (A) NEGATIVE mg/dL    TEST NOT REPORTED DUE TO COLOR INTERFERENCE OF URINE PIGMENT   Protein, ur (A) NEGATIVE mg/dL    TEST NOT REPORTED DUE TO COLOR INTERFERENCE OF URINE PIGMENT   Nitrite (A) NEGATIVE    TEST NOT REPORTED DUE TO COLOR INTERFERENCE OF URINE PIGMENT   Leukocytes,Ua (A) NEGATIVE    TEST NOT REPORTED DUE TO COLOR INTERFERENCE OF URINE PIGMENT   RBC / HPF >50 (H) 0 - 5 RBC/hpf   WBC, UA 11-20 0 - 5 WBC/hpf   Bacteria, UA RARE (A) NONE SEEN    Comment: Performed at [facility], [place] [address], [place], [place] [address]  Hemoglobin and hematocrit, blood     Status: Abnormal   Collection Time: 10/08/21  3:53 PM  Result Value Ref Range   Hemoglobin 6.0 (LL) 12.0 - 15.0 g/dL    Comment: This critical result has verified and been called to [facility] by Rande Brunt on 10 12 2022 at 1612, and has been read back. CRITICAL RESULT VERIFIED   HCT 17.5 (L) 36.0 - 46.0 %    Comment: Performed at [facility], [place] [address], [place], [place] [address]  Type and screen [place]     Status: None (Preliminary  result)   Collection Time: 10/08/21  4:51 PM  Result Value Ref Range   ABO/RH(D) A POS    Antibody Screen NEG    Sample Expiration 10/11/2021,2359    Unit Number [id number]    Blood Component Type RED CELLS,LR    Unit division 00    Status of Unit ALLOCATED    Transfusion Status OK TO TRANSFUSE    Crossmatch Result      Compatible Performed at [place] [address] [place], [place] [address]    Unit Number [id number]    Blood Component Type RED CELLS,LR    Unit division 00    Status of Unit ALLOCATED    Transfusion Status OK TO TRANSFUSE    Crossmatch Result Compatible   Prepare RBC (crossmatch)     Status: None   Collection Time: 10/08/21  4:51 PM  Result Value Ref Range   Order Confirmation      ORDER PROCESSED BY BLOOD BANK Performed at [facility], [place] [address] [place], [place] [address]   ABO/Rh     Status: None   Collection Time: 10/08/21  5:00 PM  Result Value Ref Range   ABO/RH(D)      A POS Performed at [facility], [place] [address], [place], [place] [address]    Recent Results (from the past 240 hour(s))  Resp Panel by RT-PCR (Flu A&B, Covid) Nasopharyngeal Swab     Status: None   Collection Time: 10/01/21  1:43 PM   Specimen: Nasopharyngeal Swab; Nasopharyngeal(NP) swabs in vial transport medium  Result Value Ref Range Status   SARS Coronavirus 2 by RT PCR NEGATIVE NEGATIVE Final    Comment: (NOTE) SARS-CoV-2 target nucleic acids are NOT DETECTED.  The SARS-CoV-2 RNA is generally detectable in upper respiratory specimens during the acute phase of infection. The lowest concentration of SARS-CoV-2 viral copies this assay can detect is 138 copies/mL. A negative result does not preclude SARS-Cov-2 infection and should not be used as the sole basis for treatment or other patient management decisions. A negative result may occur with  improper  specimen collection/handling, submission of specimen other than nasopharyngeal swab, presence of viral mutation(s) within the areas targeted by this assay, and inadequate number of viral copies(<138 copies/mL). A negative result must be combined with clinical observations, patient history, and epidemiological information. The expected result is Negative.  Fact Sheet for Patients:  EntrepreneurPulse.com.au  Fact Sheet for Healthcare Providers:  IncredibleEmployment.be  This test is no t yet approved or cleared by the [place] FDA and  has been authorized for detection and/or diagnosis of SARS-CoV-2 by FDA under an Emergency Use Authorization (EUA). This EUA will remain  in effect (meaning this test can be used) for the duration of the COVID-19 declaration under Section 564(b)(1) of the Act, 21 U.S.C.section 360bbb-3(b)(1), unless the authorization is terminated  or revoked sooner.       Influenza A by PCR NEGATIVE NEGATIVE Final   Influenza B by PCR NEGATIVE NEGATIVE Final    Comment: (NOTE) The Xpert Xpress SARS-CoV-2/FLU/RSV plus assay is intended as an aid in the diagnosis of influenza from Nasopharyngeal swab specimens and should not be used as a sole basis for treatment. Nasal washings and aspirates are unacceptable for Xpert Xpress SARS-CoV-2/FLU/RSV testing.  Fact Sheet for Patients: EntrepreneurPulse.com.au  Fact Sheet for Healthcare Providers: IncredibleEmployment.be  This test is not yet approved or cleared by the [place] FDA and has been authorized for detection  and/or diagnosis of SARS-CoV-2 by FDA under an Emergency Use Authorization (EUA). This EUA will remain in effect (meaning this test can be used) for the duration of the COVID-19 declaration under Section 564(b)(1) of the Act, 21 U.S.C. section 360bbb-3(b)(1), unless the authorization is terminated or revoked.  Performed at  [facility], [place] [address] [place], [place] [address]    Creatinine: Recent Labs    10/02/21 5947 10/03/21 0342 10/04/21 0333 10/05/21 0331 10/06/21 0342 10/07/21 0323 10/08/21 0427  CREATININE 1.01* 1.01* 0.99 0.91 0.87 0.74 0.71    Xrays: See report/chart Reviewed above  Impression/Assessment:  Patient appears to be having renal bleeding.  The hyperdense area in the right kidney was not present last week on CT scan.  The contrast in her bladder is likely contrast and I do not think it is blood.  Plan:  The bleeding may be improving based upon the color of the urine.  Treat patient with supportive care.  We will follow the patient.  Covering the patient with a broad-spectrum antibiotic is reasonable if its not a contraindication to her other medical issues.  Clinically she does not have a pyelonephritis with hematuria but a broad-spectrum antibiotic would cover her for this.  Urine culture is pending.  Irrigate Foley catheter as necessary.  I did not upsize the catheter.  At this stage does not need to see interventional radiology  Nicole Ruiz A [place] Pallas 10/08/2021, 6:40 PM

## 2021-10-09 ENCOUNTER — Encounter (HOSPITAL_COMMUNITY)
Admission: EM | Disposition: A | Discharge status: Skilled Nursing Facility | Payer: Self-pay | Source: Home / Self Care | Attending: Internal Medicine

## 2021-10-09 ENCOUNTER — Inpatient Hospital Stay (HOSPITAL_COMMUNITY): Payer: Medicare HMO | Admitting: Certified Registered Nurse Anesthetist

## 2021-10-09 ENCOUNTER — Encounter (HOSPITAL_COMMUNITY): Payer: Self-pay | Admitting: Internal Medicine

## 2021-10-09 DIAGNOSIS — K259 Gastric ulcer, unspecified as acute or chronic, without hemorrhage or perforation: Secondary | ICD-10-CM

## 2021-10-09 DIAGNOSIS — T148XXA Other injury of unspecified body region, initial encounter: Secondary | ICD-10-CM

## 2021-10-09 DIAGNOSIS — R112 Nausea with vomiting, unspecified: Secondary | ICD-10-CM | POA: Diagnosis not present

## 2021-10-09 DIAGNOSIS — E860 Dehydration: Secondary | ICD-10-CM | POA: Diagnosis not present

## 2021-10-09 DIAGNOSIS — D529 Folate deficiency anemia, unspecified: Secondary | ICD-10-CM | POA: Diagnosis not present

## 2021-10-09 DIAGNOSIS — N179 Acute kidney failure, unspecified: Secondary | ICD-10-CM | POA: Diagnosis not present

## 2021-10-09 DIAGNOSIS — R1310 Dysphagia, unspecified: Secondary | ICD-10-CM

## 2021-10-09 DIAGNOSIS — K253 Acute gastric ulcer without hemorrhage or perforation: Secondary | ICD-10-CM

## 2021-10-09 DIAGNOSIS — K222 Esophageal obstruction: Secondary | ICD-10-CM

## 2021-10-09 HISTORY — PX: ESOPHAGOGASTRODUODENOSCOPY (EGD) WITH PROPOFOL: SHX5813

## 2021-10-09 HISTORY — PX: BIOPSY: SHX5522

## 2021-10-09 LAB — COMPREHENSIVE METABOLIC PANEL
ALT: 25 U/L (ref 0–44)
AST: 51 U/L — ABNORMAL HIGH (ref 15–41)
Albumin: 2.4 g/dL — ABNORMAL LOW (ref 3.5–5.0)
Alkaline Phosphatase: 49 U/L (ref 38–126)
Anion gap: 12 (ref 5–15)
BUN: 9 mg/dL (ref 8–23)
CO2: 23 mmol/L (ref 22–32)
Calcium: 8 mg/dL — ABNORMAL LOW (ref 8.9–10.3)
Chloride: 104 mmol/L (ref 98–111)
Creatinine, Ser: 0.74 mg/dL (ref 0.44–1.00)
GFR, Estimated: >60 mL/min (ref >60)
Glucose, Bld: 95 mg/dL (ref 70–99)
Potassium: 3 mmol/L — ABNORMAL LOW (ref 3.5–5.1)
Sodium: 139 mmol/L (ref 135–145)
Total Bilirubin: 1.1 mg/dL (ref 0.3–1.2)
Total Protein: 5 g/dL — ABNORMAL LOW (ref 6.5–8.1)

## 2021-10-09 LAB — CBC WITH DIFFERENTIAL/PLATELET
Abs Immature Granulocytes: 0.18 10*3/uL — ABNORMAL HIGH (ref 0.00–0.07)
Basophils Absolute: 0 10*3/uL (ref 0.0–0.1)
Basophils Relative: 0 %
Eosinophils Absolute: 0 10*3/uL (ref 0.0–0.5)
Eosinophils Relative: 0 %
HCT: 32.2 % — ABNORMAL LOW (ref 36.0–46.0)
Hemoglobin: 11.2 g/dL — ABNORMAL LOW (ref 12.0–15.0)
Immature Granulocytes: 3 %
Lymphocytes Relative: 11 %
Lymphs Abs: 0.8 10*3/uL (ref 0.7–4.0)
MCH: 29.6 pg (ref 26.0–34.0)
MCHC: 34.8 g/dL (ref 30.0–36.0)
MCV: 85.2 fL (ref 80.0–100.0)
Monocytes Absolute: 0.9 10*3/uL (ref 0.1–1.0)
Monocytes Relative: 12 %
Neutro Abs: 5.3 10*3/uL (ref 1.7–7.7)
Neutrophils Relative %: 74 %
Platelets: 124 10*3/uL — ABNORMAL LOW (ref 150–400)
RBC: 3.78 MIL/uL — ABNORMAL LOW (ref 3.87–5.11)
RDW: 14.2 % (ref 11.5–15.5)
WBC: 7.1 10*3/uL (ref 4.0–10.5)
nRBC: 0.3 % — ABNORMAL HIGH (ref 0.0–0.2)

## 2021-10-09 LAB — MAGNESIUM: Magnesium: 1.8 mg/dL (ref 1.7–2.4)

## 2021-10-09 SURGERY — ESOPHAGOGASTRODUODENOSCOPY (EGD) WITH PROPOFOL
Anesthesia: Monitor Anesthesia Care

## 2021-10-09 MED ORDER — LACTATED RINGERS IV SOLN: INTRAVENOUS | Status: DC | PRN

## 2021-10-09 MED ORDER — POTASSIUM CHLORIDE 20 MEQ PO PACK
40.0000 meq | PACK | ORAL | Status: DC
  Filled 2021-10-09 (×2): qty 2

## 2021-10-09 MED ORDER — SODIUM CHLORIDE 0.9 % IV SOLN
2.0000 g | INTRAVENOUS | Status: DC
  Filled 2021-10-09: qty 20

## 2021-10-09 MED ORDER — SODIUM CHLORIDE 0.9 % IV SOLN
2.0000 g | Freq: Every day | INTRAVENOUS | Status: AC
  Administered 2021-10-09 – 2021-10-15 (×7): 2 g via INTRAVENOUS
  Filled 2021-10-09 (×7): qty 2

## 2021-10-09 MED ORDER — PROPOFOL 500 MG/50ML IV EMUL
INTRAVENOUS | Status: DC | PRN
  Administered 2021-10-09: 75 ug/kg/min via INTRAVENOUS

## 2021-10-09 MED ORDER — MAGNESIUM SULFATE 2 GM/50ML IV SOLN
2.0000 g | Freq: Once | INTRAVENOUS | Status: AC
  Administered 2021-10-09: 2 g via INTRAVENOUS
  Filled 2021-10-09: qty 50

## 2021-10-09 MED ORDER — SODIUM CHLORIDE 0.9 % IV SOLN: INTRAVENOUS | Status: DC

## 2021-10-09 MED ORDER — POTASSIUM CHLORIDE 10 MEQ/100ML IV SOLN
10.0000 meq | INTRAVENOUS | Status: AC
Start: 2021-10-09 — End: 2021-10-09
  Administered 2021-10-09 (×4): 10 meq via INTRAVENOUS
  Filled 2021-10-09 (×4): qty 100

## 2021-10-09 MED ORDER — MAGNESIUM SULFATE 4 GM/100ML IV SOLN: 4.0000 g | Freq: Once | INTRAVENOUS | Status: DC

## 2021-10-09 SURGICAL SUPPLY — 15 items

## 2021-10-09 NOTE — Op Note (Signed)
[facility] Patient Name: Nicole Ruiz Procedure Date: 10/09/2021 MRN: [id number] Attending MD: Docia Chuck. Henrene Pastor , MD Date of Birth: Jun 20, 1941 CSN: [id number] Age: 80 Admit Type: Inpatient Procedure:                Upper GI endoscopy with biopsies Indications:              Dysphagia Providers:                Docia Chuck. Henrene Pastor, MD, Particia Nearing, RN, Cherylynn Ridges,                            Technician, Luan Moore, Technician, Eliberto Ivory Referring MD:             [facility] hospitalist Medicines:                Monitored Anesthesia Care Complications:            No immediate complications. Estimated Blood Loss:     Estimated blood loss: none. Procedure:                Pre-Anesthesia Assessment:                           - Prior to the procedure, a History and Physical                            was performed, and patient medications and                            allergies were reviewed. The patient's tolerance of                            previous anesthesia was also reviewed. The risks                            and benefits of the procedure and the sedation                            options and risks were discussed with the patient.                            All questions were answered, and informed consent                            was obtained. Prior Anticoagulants: The patient has                            taken no previous anticoagulant or antiplatelet                            agents. ASA Grade Assessment: III - A patient with  severe systemic disease. After reviewing the risks                            and benefits, the patient was deemed in                            satisfactory condition to undergo the procedure.                           After obtaining informed consent, the endoscope was                            passed under direct vision. Throughout the                            procedure, the  patient's blood pressure, pulse, and                            oxygen saturations were monitored continuously. The                            [id number]-[id number] ([id number] Olympus endoscope was introduced                            through the mouth, and advanced to the second part                            of duodenum. The upper GI endoscopy was                            accomplished without difficulty. The patient                            tolerated the procedure well. Scope In: Scope Out: Findings:      The esophagus was normal save large caliber distal ring.      Few gastric ulcers were found in the gastric antrum. The largest       measured 1 cm. There were no high risk stigmata or active bleeding.[name]       Biopsies were taken from the gastric antrum with a cold forceps for       histology.      The stomach was otherwise normal, save small hiatal hernia.      The examined duodenum was normal.      The cardia and gastric fundus were normal on retroflexion. Impression:               1. Incidental large caliber distal esophageal ring                           2. Gastric ulcers                           3. Otherwise unremarkable EGD                           4. No findings  on endoscopy to explain current                            swallowing issues. Moderate Sedation:      none Recommendation:           1. Diet as tolerated                           2. We will follow-up on gastric biopsies                           3. Recommend pantoprazole 40 mg twice daily. Would                            administer IV until she is able to take pills.                           4. Return to the care of primary service. GI will                            sign off but available as needed.                           The findings were discussed with the patient's                            husband Nicole Ruiz by telephone. Procedure Code(s):        --- Professional ---                           [phone],  Esophagogastroduodenoscopy, flexible,                            transoral; with biopsy, single or multiple Diagnosis Code(s):        --- Professional ---                           K25.9, Gastric ulcer, unspecified as acute or                            chronic, without hemorrhage or perforation                           R13.10, Dysphagia, unspecified CPT copyright 2019 American Medical Association. All rights reserved. The codes documented in this report are preliminary and upon coder review may  be revised to meet current compliance requirements. Docia Chuck. Henrene Pastor, MD 10/09/2021 2:36:45 PM This report has been signed electronically. Number of Addenda: 0

## 2021-10-09 NOTE — Progress Notes (Addendum)
Pt nauseated at this time. Requests to return later. Rn aware. Midline to be monitored/removed per RN

## 2021-10-09 NOTE — Progress Notes (Addendum)
Patient ID: Nicole Ruiz, female   DOB: 1941/03/24, 80 y.o.   MRN: [id number]    Progress Note   Subjective   Day # 7  CC; weakness, dysphagia/odynophagia, nausea, vomiting  Hemoglobin has dropped over the past couple of days, down to 6.0 last p.m. transfused and up to 11.2 today Continues to have hematuria Urine culture pending  Potassium 3.0 BUN 9/creatinine 0.74  Patient says she does not feel well but is unable to be specific, when asked directly she says she still has discomfort with swallowing and a sensation that food is sticking.  Her husband who is at bedside says she did not need anything other than part of a container of applesauce yesterday with her meds.    Objective   Vital signs in last 24 hours: Temp:  [97.2 F (36.2 C)-98.6 F (37 C)] 98.5 F (36.9 C) (10/13 0544) Pulse Rate:  [80-85] 83 (10/13 0544) Resp:  [17-18] 18 (10/13 0544) BP: (144-177)/(64-78) 155/72 (10/13 0544) SpO2:  [95 %-99 %] 97 % (10/13 0544) Last BM Date: 10/04/21 General: Elderly frail-appearing white female in NAD Heart:  Regular rate and rhythm; no murmurs Lungs: Respirations even and unlabored, lungs CTA bilaterally Abdomen:  Soft, nontender and nondistended. Normal bowel sounds. Extremities: Right upper extremity with significant bruising and swelling of the upper forearm, distal to the midline Neurologic:  Alert and orientedx2  . Psych:  Cooperative. Normal mood and affect.  Intake/Output from previous day: 10/12 0701 - 10/13 0700 In: 2345.4 [P.O.:20; I.V.:1588.6; Blood:622.5; IV Piggyback:74.3] Out: 2350 [Urine:2350] Intake/Output this shift: No intake/output data recorded.  Lab Results: Recent Labs    10/07/21 0323 10/08/21 0427 10/08/21 1553 10/09/21 0442  WBC 9.1 8.1  --  7.1  HGB 8.9* 7.3* 6.0* 11.2*  HCT 25.4* 21.1* 17.5* 32.2*  PLT 148* 136*  --  124*   BMET Recent Labs    10/07/21 0323 10/08/21 0427 10/09/21 0442  NA 134* 135 139  K 3.7 3.4* 3.0*  CL 103  104 104  CO2 17* 18* 23  GLUCOSE 99 106* 95  BUN 14 13 9   CREATININE 0.74 0.71 0.74  CALCIUM 8.0* 8.1* 8.0*   LFT Recent Labs    10/09/21 0442  PROT 5.0*  ALBUMIN 2.4*  AST 51*  ALT 25  ALKPHOS 49  BILITOT 1.1   PT/INR No results for input(s): LABPROT, INR in the last 72 hours.  Studies/Results: CT CHEST WO CONTRAST  Result Date: 10/07/2021 CLINICAL DATA:  Chest pain, cough, shortness of breath. Calcification or ossification, muscle EXAM: CT CHEST WITHOUT CONTRAST TECHNIQUE: Multidetector CT imaging of the chest was performed following the standard protocol without IV contrast. COMPARISON:  CT 03/01/2021. Chest x-ray 10/01/2021. CT abdomen 10/01/2021 FINDINGS: Cardiovascular: Heart size within normal limits. No pericardial effusion. Thoracic aorta is nonaneurysmal. Scattered atherosclerotic calcifications of the aorta and coronary arteries. Mild prominence of the right and left main pulmonary arteries. Mediastinum/Nodes: No enlarged mediastinal or axillary lymph nodes. Thyroid gland, trachea, and esophagus demonstrate no significant findings. Lungs/Pleura: Scattered foci of centrilobular ground-glass nodularity seen within both lungs may be minimally improved compared to CT 03/01/2021. No new focal airspace consolidation. Minimal bibasilar scarring or atelectasis. No pleural effusion or pneumothorax. Upper Abdomen: Layering hyperdense fluid within the gallbladder lumen is likely related to vicarious excretion of IV contrast from recent CT scan. No acute findings within the included upper abdomen. Unchanged appearance of the bilateral adrenal glands and visualized kidneys. Musculoskeletal: New subacute appearing fracture of the  manubrial body with developing callus formation (series 6, image 84). Mild superior endplate compression fractures of T1 and T2 are new from 03/01/2021. Mild superior endplate compression fracture of T11 is again seen without progressive height loss from previous CT  10/01/2021. No chest wall hematoma or other abnormality. A loop recorder device is noted within the left chest wall. IMPRESSION: 1. New subacute-appearing fracture of the manubrial body with developing callus formation. 2. Mild superior endplate compression fractures of T1 and T2 are new from 03/01/2021, but also likely subacute. 3. Mild superior endplate compression fracture of T11 is again seen without progressive height loss from previous CT 10/01/2021. 4. Scattered foci of centrilobular ground-glass nodularity within both lungs may be minimally improved compared to CT 03/01/2021. Findings may reflect a chronic atypical infectious or inflammatory bronchiolitis. 5. Mild prominence of the right and left main pulmonary arteries, which can be seen with pulmonary arterial hypertension. 6. Aortic and coronary artery atherosclerosis (ICD10-I70.0). Electronically Signed   By: Davina Poke D.O.   On: 10/07/2021 15:15   MR HUMERUS RIGHT W WO CONTRAST  Result Date: 10/09/2021 CLINICAL DATA:  Soft tissue mass, upper arm, US/xray nondiagnostic EXAM: MRI OF THE RIGHT HUMERUS WITHOUT AND WITH CONTRAST TECHNIQUE: Multiplanar, multisequence MR imaging of the right humerus was performed before and after the administration of intravenous contrast. CONTRAST:  41mL GADAVIST GADOBUTROL 1 MMOL/ML IV SOLN COMPARISON:  Ultrasound 10/07/2021 FINDINGS: Bones/Joint/Cartilage The cortex is intact. There is no significant marrow signal alteration. Ligaments Grossly intact, suboptimal protocol for complete evaluation. Muscles and Tendons Within the brachioradialis muscle, there is a heterogeneous collection with intrinsic T1 hyperintensity and no enhancement which measures up to 4.0 x 2.8 cm in the axial dimension and 13.7 cm in craniocaudal extent (axial T2 image 31, sagittal STIR image 17). This collection spans from the level of the distal humerus to the proximal radius across the elbow. There is extensive intramuscular edema within  the upper arm. Soft tissues Extensive soft tissue edema. There is an additional fluid collection seen along the deltoid muscle proximally which has similar signal characteristics and measures 2.1 x 1.6 x 3.1 cm (axial T2 image 3, coronal stir image 21). IMPRESSION: Large intramuscular heterogeneous collection within the brachioradialis muscle, measuring 4.0 x 2.8 cm in the axial dimension and 13.7 cm in craniocaudal extent. Signal characteristics are most compatible with a hematoma. Additional collection seen along the deltoid muscle proximally which has similar signal characteristics and measures 2.1 x 1.6 x 3.1 cm. This is also favored to be a hematoma. Recommend follow-up ultrasound in 6-12 weeks or MRI in 3 months to ensure these collections are resolving. Electronically Signed   By: Maurine Simmering M.D.   On: 10/09/2021 08:03   [place] RT UPPER EXTREM LTD SOFT TISSUE NON VASCULAR  Result Date: 10/07/2021 CLINICAL DATA:  Arm pain in the antecubital fossa EXAM: ULTRASOUND RIGHT UPPER EXTREMITY LIMITED TECHNIQUE: Ultrasound examination of the upper extremity soft tissues was performed in the area of clinical concern. COMPARISON:  None. FINDINGS: In the region of concern we demonstrate a 7.2 by 2.3 by 4.2 cm (volume = 36 cm^3) complex fluid density lesion which is possibly intramuscular or least has a thick surrounding rind. There is also an adjacent more superficial 2.1 by 0.6 by 3.6 cm (volume = 2 cm^3) collection likewise with a surrounding soft tissues which may be muscular tissues. Possibilities may include mass, infection, or hematoma. Correlate clinically and consider MRI for further workup if clinically warranted. IMPRESSION: 1. Complex  cystic lesions along striated surrounding tissues which may represent muscles in the region of concern. Possibilities might include hematoma, abscess, or centrally necrotic mass. If the cause is not obvious clinically, consider MRI. Electronically Signed   By: Van Clines  M.D.   On: 10/07/2021 20:02   CT RENAL STONE STUDY  Result Date: 10/08/2021 CLINICAL DATA:  Hematuria.  Right flank pain. EXAM: CT ABDOMEN AND PELVIS WITHOUT CONTRAST TECHNIQUE: Multidetector CT imaging of the abdomen and pelvis was performed following the standard protocol without IV contrast. COMPARISON:  CT abdomen pelvis 05/16/2007 report without imaging, CT abdomen pelvis 10/01/2021 FINDINGS: Lower chest: Bilateral lower lobe subsegmental atelectasis. Cardiac findings suggestive of anemia. Hepatobiliary: No focal liver abnormality. Layering hyperdensity within the gallbladder lumen suggestive gallstones. No gallbladder wall thickening or pericholecystic fluid. No biliary dilatation. Pancreas: No focal lesion. Normal pancreatic contour. No surrounding inflammatory changes. No main pancreatic ductal dilatation. Spleen: Normal in size without focal abnormality. A splenule is noted. Adrenals/Urinary Tract: No adrenal nodule bilaterally. Bilateral renal cortical scarring. No nephrolithiasis and no hydronephrosis. There is a 4.1 cm fluid density lesion within the left kidney that likely represents a simple renal cyst. Interval development of a 2 x 1.6 x 2.6 cm hyperdense heterogeneous lesion within the right kidney. No ureterolithiasis or hydroureter. Layering hyperdensity within the urinary bladder lumen likely related to excreted intravenous contrast from recently administered IV contrast on 10/06/2021 for CT neck. Stomach/Bowel: Stomach is within normal limits. No evidence of bowel wall thickening or dilatation. Diffuse sigmoid diverticulosis. The appendix not definitely identified. Vascular/Lymphatic: No abdominal aorta or iliac aneurysm. Severe atherosclerotic plaque of the aorta and its branches. No abdominal, pelvic, or inguinal lymphadenopathy. Reproductive: Status post hysterectomy. No adnexal masses. Other: No intraperitoneal free fluid. No intraperitoneal free gas. No organized fluid collection.  Musculoskeletal: No abdominal wall hernia or abnormality. No suspicious lytic or blastic osseous lesions. No acute displaced fracture. In stray shin of an age-indeterminate mild T11 compression fracture. Surgical hardware of the proximal right femoral shaft. IMPRESSION: 1. Interval development of a 2 x 1.6 x 2.6 cm hyperdensity within the right kidney that may represent blood products/renal cast (new compared to 10/01/21). 2. Cholelithiasis. 3. Colonic diverticulosis with no acute diverticulitis. 4. Otherwise limited evaluation on this noncontrast study. Electronically Signed   By: Iven Finn M.D.   On: 10/08/2021 16:09       Assessment / Plan:    #1 80 year old white female with complaints of dysphagia, odynophagia, nausea, poor appetite he really has not been eating anything since admission. Etiology of symptoms is not clear, work-up to date unrevealing as to cause of the above symptoms.  Continue PPI therapy Plan to proceed with EGD today   Will await results of the EGD today, she needs alternative means of nutrition at this point either TPN or consideration of enteral feedings versus PEG.  Primary service can decide with the family regarding nutritional support options.  #2 acute kidney injury on chronic kidney disease stage III #3 gross hematuria with drop in hemoglobin requiring transfusion #4 normocytic anemia significant drop since admission questional secondary to hematuria counts much improved post transfusions last p.m. 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  Agree with interval progress note.  Now for upper endoscopy.  Docia Chuck. Geri [place]inole., M.D. [facility] Division of Gastroenterology

## 2021-10-09 NOTE — Transfer of Care (Signed)
Immediate Anesthesia Transfer of Care Note  Patient: Nicole Ruiz  Procedure(s) Performed: Procedure(s): ESOPHAGOGASTRODUODENOSCOPY (EGD) WITH PROPOFOL (N/A) BIOPSY  Patient Location: PACU and Endoscopy Unit  Anesthesia Type:MAC  Level of Consciousness: awake, alert  and oriented  Airway & Oxygen Therapy: Patient Spontanous Breathing and Patient connected to nasal cannula oxygen  Post-op Assessment: Report given to RN and Post -op Vital signs reviewed and stable  Post vital signs: Reviewed and stable  Last Vitals:  Vitals:   10/09/21 0900 10/09/21 1329  BP: 138/87 (!) 158/63  Pulse: 81 83  Resp: 17 15  Temp: 36.8 C 36.9 C  SpO2: 376% 283%    Complications: No apparent anesthesia complications

## 2021-10-09 NOTE — Progress Notes (Signed)
Arrived tro perform routine dressing change. RN removed line. Arm a bit edematous.

## 2021-10-09 NOTE — Anesthesia Postprocedure Evaluation (Signed)
Anesthesia Post Note  Patient: JENNFER GASSEN  Procedure(s) Performed: ESOPHAGOGASTRODUODENOSCOPY (EGD) WITH PROPOFOL BIOPSY     Patient location during evaluation: PACU Anesthesia Type: MAC Level of consciousness: awake and alert Pain management: pain level controlled Vital Signs Assessment: post-procedure vital signs reviewed and stable Respiratory status: spontaneous breathing, nonlabored ventilation and respiratory function stable Cardiovascular status: blood pressure returned to baseline and stable Postop Assessment: no apparent nausea or vomiting Anesthetic complications: no   No notable events documented.  Last Vitals:  Vitals:   10/09/21 1329 10/09/21 1430  BP: (!) 158/63 123/60  Pulse: 83 75  Resp: 15 16  Temp: 36.9 C 37.2 C  SpO2: 100% 99%    Last Pain:  Vitals:   10/09/21 1430  TempSrc: Axillary  PainSc: 0-No pain                 Pervis Hocking

## 2021-10-09 NOTE — Progress Notes (Signed)
PROGRESS NOTE    Nicole Ruiz  WOE:[id number] DOB: 04-28-1941 DOA: 10/01/2021 PCP: Bernerd Limbo, MD    No chief complaint on file.   Brief Narrative:  Nicole Ruiz is a 80 y.o. female with medical history significant of hypertension, hyperlipidemia, CKD stage II, depression who presents with intractable nausea vomiting and poor p.o. intake with dehydration over the past 3 to 4 days.  Patient denies any sick contacts recent travel or change in dietary habits.  Apparently some 4 days prior to admission she was having some mild nausea symptoms with poor p.o. intake which continued to worsen over the past 4 days now with intractable symptoms and abnormal labs as evaluated in the ED with elevated creatinine from baseline and an anion gap greater than 20. Hospitalist called to admit. Patient also with complaints of dysphagia, multiple attempts of barium swallow done unsuccessful, GI consulted and patient for probable upper endoscopy tomorrow 10/09/2021.  Patient also noted to have right upper extremity with concerns for cystic fluid collection.  MRI upper extremity pending.   Assessment & Plan:   Principal Problem:   Nausea and vomiting Active Problems:   Esophageal reflux   Clinical depression   Hypothyroid   AKI (acute kidney injury) ([place])   UTI (urinary tract infection)   Oropharyngeal dysphagia   Failure to thrive (child)   Anemia   Hematuria   Dehydration  #1 intractable nausea vomiting, unlikely infectious process, POA/dysphagia/odynophagia, ongoing -Questionable etiology. -Patient noted to have UTI on presentation and received 3 days of IV Rocephin. -Patient and husband poor historians, patient's intractable nausea vomiting concerning for possible obstructive process and is noted per husband that patient has had difficulty swallowing for over 4 weeks with poor oral intake. -Multiple attempts at barium swallow unsuccessful, see speech evaluation note as below without any  overt spillage or aspiration patient able to only take minimal oral contrast. -CT neck unremarkable for any acute findings of odynophagia/dysphagia. -CT chest with new subacute appearing fracture of the manubrium body with developing callus formation, mild superior endplate compression fractures T1 and T2 new from 03/01/2021 but also likely subacute, mild superior endplate compression fracture T11 again seen without progressive height loss from prior CT 00370 22.  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  Mild prominence of the right and left main pulmonary arteries which can be seen with pulmonary arterial hypertension.  Aortic and coronary artery atherosclerosis. -Patient seen in consultation by GI and patient started on PPI twice daily and scheduled for EGD today 10/09/2021. -Appreciate GI input and recommendations.  2.  Hematuria -Questionable etiology. -Repeat UA with cultures and sensitivities consistent with a Pseudomonas UTI. -Status post 3 days IV Rocephin. -CT renal stone protocol with interval development of 2 x 1.6 x 2.6 cm hypodensity within the right kidney may represent blood product/renal cast.   -Foley catheter was placed with irrigation with hematuria improving.  -Place empirically on IV cefepime to treat a Pseudomonas UTI.   -Urology consulted and following. -Follow.  3.  Iron deficiency anemia /folate deficiency/acute blood loss anemia -Could likely be secondary to hematuria, muscular hematoma noted in wrap upper extremity and possible GI bleed.  Patient with no overt GI bleed. -Patient seen in consultation by GI and patient for upper endoscopy today. -Continue PPI twice daily. -Anemia panel with iron deficiency anemia and folate deficiency with folate of 2.5, iron  of 24, TIBC of 125. -Repeat H&H this  afternoon. -Continue folic acid 1 mg daily which we will change to IV due to swallow difficulties. -Transfusion threshold hemoglobin < 7.  4.  Acute metabolic encephalopathy, POA resolved/baseline dementia -Patient's mental status noted to be close to baseline. -Baseline dementia suspected, unclear diagnosis. -UDS was positive for barbiturates, patient declines being on anything in this drug class, current home med list unclear whether accurate does not contain barbiturates, unclear how she is positive for this medication list false positive in the setting of high NSAID use.  5.  Right upper extremity hematoma/phlebitis -Ultrasound right upper extremity done concerning for complex cystic lesions along striated surrounding tissues may represent muscles in the region of concern, possibilities may include hematoma, abscess or centrally necrotic mass.  MRI of upper extremity pending.  6.  Mild acute kidney injury on chronic kidney disease stage II, resolved -Renal function back to baseline. -Monitor with hydration.  7.  UTI,>>>> Pseudomonas UTI -Urine cultures not obtained on admission. -Patient is status post 3 days IV Rocephin. -Repeat UA with cultures and sensitivities consistent with a UTI with 60,000 colonies of Pseudomonas aeruginosa with sensitivities pending. -Place on IV cefepime.. -Urology following due to hematuria  8.  Anion gap metabolic acidosis of unclear etiology, resolving -Unclear etiology. -BUN in the 30s on admission. -Salicylate, acetaminophen, ethanol levels unremarkable. -UDS positive for barbiturates only. -Urine had mild ketosis. -Improved with hydration.  9.  Hypomagnesemia/hypokalemia -Magnesium at 1.8, potassium at 3.0.   -Magnesium sulfate 2 g IV x1.   -KCl 10 mEq IV every hour x5 runs.   -Repeat labs in the morning.  10.  Hypothyroidism -Synthroid.   11.??  Dementia -Continue home regimen Namenda, Aricept.  12.  Depression Continue  Wellbutrin.  13.  Intramuscular hematoma of right upper extremity -Patient noted with right upper extremity swelling and redness and painful. -Ultrasound done concerning for complex cystic lesions along striated surrounding tissues which may represent muscles in the region of concerns.  Possibilities included hematoma, abscess or centrally necrotic mass. -MRI of the humerus done with large intramuscular heterogeneous collection within the brachioradialis muscle measuring 4 x 2.8 cm in axial dimension and 13 x 7 cm in craniocaudal extent with characteristics most compatible with hematoma.  Similar finding noted in the deltoid muscle favored to be hematoma. -Warm compresses, elevate upper extremity. -We will need to remove midline. -We will need follow-up ultrasound in 6 to 12 weeks or MRI in 3 months to ensure collections resolving.    DVT prophylaxis: SCDs Code Status: Full Family Communication: Updated patient and husband at bedside. Disposition:   Status is: Inpatient  Remains inpatient appropriate because:Inpatient level of care appropriate due to severity of illness  Dispo: The patient is from: Home              Anticipated d/c is to:  TBD              Patient currently is not medically stable to d/c.   Difficult to place patient No       Consultants:  Gastroenterology: Dr. Henrene Pastor 10/06/2021  Procedures:  CT renal stone protocol pending 10/08/2021 CT chest without contrast 10/07/2021 CT soft tissue neck 10/06/2021 CT head 10/01/2021 Chest x-ray 10/01/2021 MBS 10/05/2021 MRI humerus 10/08/2021 Ultrasound right upper extremity soft tissue 10/07/2021 Multiple attempts at barium swallow unsuccessful EGD pending 10/09/2021  Antimicrobials:  IV Rocephin 10/02/2021>>>>> 10/04/2021 IV cefepime 10/09/2021>>>>>    Subjective: Patient laying in bed.  States  difficulty swallowing.  Complain of upper epigastric pain.  No shortness of breath.  No abdominal pain.  Hematuria  improving.  Objective: Vitals:   10/09/21 0106 10/09/21 0108 10/09/21 0303 10/09/21 0544  BP: (!) 152/78 (!) 152/78 (!) 173/75 (!) 155/72  Pulse: 82 80 81 83  Resp: 18 18 18 18   Temp: 98 F (36.7 C) 98 F (36.7 C) 98.1 F (36.7 C) 98.5 F (36.9 C)  TempSrc: Oral Oral Oral Oral  SpO2: 98% 98% 97% 97%  Weight:      Height:        Intake/Output Summary (Last 24 hours) at 10/09/2021 1045 Last data filed at 10/09/2021 0600 Gross per 24 hour  Intake 2345.43 ml  Output 2250 ml  Net 95.43 ml    Filed Weights   10/01/21 1231 10/01/21 2215  Weight: 66.4 kg 57.7 kg    Examination:  General exam: NAD.  Dry mucous membranes. Respiratory system: CTA B anterior lung fields.  No wheezes, no crackles, no rhonchi.  Cardiovascular system: RRR no murmurs rubs or gallops.  No JVD.  No lower extremity edema. Gastrointestinal system: Abdomen is soft, nontender, nondistended, positive bowel sounds.  No rebound.  No guarding.  Central nervous system: Alert and oriented.  Moving extremities spontaneously.  No focal neurological deficits.   Extremities: Right upper extremity with midline with some swelling in the antecubital area with some redness, some induration and tenderness to palpation.  Skin: No rashes, lesions or ulcers Psychiatry: Judgement and insight appear poor to fair. Mood & affect appropriate.     Data Reviewed: I have personally reviewed following labs and imaging studies  CBC: Recent Labs  Lab 10/05/21 0331 10/06/21 0342 10/07/21 0323 10/08/21 0427 10/08/21 1553 10/09/21 0442  WBC 8.2 7.7 9.1 8.1  --  7.1  NEUTROABS  --   --   --   --   --  5.3  HGB 11.7* 10.6* 8.9* 7.3* 6.0* 11.2*  HCT 34.1* 30.8* 25.4* 21.1* 17.5* 32.2*  MCV 89.3 87.7 87.0 87.9  --  85.2  PLT 178 171 148* 136*  --  124*     Basic Metabolic Panel: Recent Labs  Lab 10/05/21 0331 10/06/21 0342 10/07/21 0323 10/08/21 0427 10/08/21 0848 10/09/21 0442  NA 140 140 134* 135  --  139  K 3.7  3.0* 3.7 3.4*  --  3.0*  CL 105 108 103 104  --  104  CO2 15* 16* 17* 18*  --  23  GLUCOSE 89 107* 99 106*  --  95  BUN 11 11 14 13   --  9  CREATININE 0.91 0.87 0.74 0.71  --  0.74  CALCIUM 8.7* 8.5* 8.0* 8.1*  --  8.0*  MG  --   --   --   --  1.4* 1.8     GFR: Estimated Creatinine Clearance: 46.4 mL/min (by C-G formula based on SCr of 0.74 mg/dL).  Liver Function Tests: Recent Labs  Lab 10/04/21 0333 10/05/21 0331 10/06/21 0342 10/07/21 0323 10/09/21 0442  AST 24 23 22 25  51*  ALT 19 18 19 18 25   ALKPHOS 59 58 53 43 49  BILITOT 1.4* 1.3* 1.2 1.4* 1.1  PROT 5.9* 6.1* 5.6* 4.9* 5.0*  ALBUMIN 2.9* 3.0* 2.8* 2.6* 2.4*     CBG: No results for input(s): GLUCAP in the last 168 hours.   Recent Results (from the past 240 hour(s))  Resp Panel by RT-PCR (Flu A&B, Covid) Nasopharyngeal Swab  Status: None   Collection Time: 10/01/21  1:43 PM   Specimen: Nasopharyngeal Swab; Nasopharyngeal(NP) swabs in vial transport medium  Result Value Ref Range Status   SARS Coronavirus 2 by RT PCR NEGATIVE NEGATIVE Final    Comment: (NOTE) SARS-CoV-2 target nucleic acids are NOT DETECTED.  The SARS-CoV-2 RNA is generally detectable in upper respiratory specimens during the acute phase of infection. The lowest concentration of SARS-CoV-2 viral copies this assay can detect is 138 copies/mL. A negative result does not preclude SARS-Cov-2 infection and should not be used as the sole basis for treatment or other patient management decisions. A negative result may occur with  improper specimen collection/handling, submission of specimen other than nasopharyngeal swab, presence of viral mutation(s) within the areas targeted by this assay, and inadequate number of viral copies(<138 copies/mL). A negative result must be combined with clinical observations, patient history, and epidemiological information. The expected result is Negative.  Fact Sheet for Patients:   EntrepreneurPulse.com.au  Fact Sheet for Healthcare Providers:  IncredibleEmployment.be  This test is no t yet approved or cleared by the [place] FDA and  has been authorized for detection and/or diagnosis of SARS-CoV-2 by FDA under an Emergency Use Authorization (EUA). This EUA will remain  in effect (meaning this test can be used) for the duration of the COVID-19 declaration under Section 564(b)(1) of the Act, 21 U.S.C.section 360bbb-3(b)(1), unless the authorization is terminated  or revoked sooner.       Influenza A by PCR NEGATIVE NEGATIVE Final   Influenza B by PCR NEGATIVE NEGATIVE Final    Comment: (NOTE) The Xpert Xpress SARS-CoV-2/FLU/RSV plus assay is intended as an aid in the diagnosis of influenza from Nasopharyngeal swab specimens and should not be used as a sole basis for treatment. Nasal washings and aspirates are unacceptable for Xpert Xpress SARS-CoV-2/FLU/RSV testing.  Fact Sheet for Patients: EntrepreneurPulse.com.au  Fact Sheet for Healthcare Providers: IncredibleEmployment.be  This test is not yet approved or cleared by the [place] FDA and has been authorized for detection and/or diagnosis of SARS-CoV-2 by FDA under an Emergency Use Authorization (EUA). This EUA will remain in effect (meaning this test can be used) for the duration of the COVID-19 declaration under Section 564(b)(1) of the Act, 21 U.S.C. section 360bbb-3(b)(1), unless the authorization is terminated or revoked.  Performed at [facility], [place] [address], [place], [place] [address]   Urine Culture     Status: Abnormal (Preliminary result)   Collection Time: 10/08/21 10:19 AM   Specimen: In/Out Cath Urine  Result Value Ref Range Status   Specimen Description   Final    IN/OUT CATH URINE Performed at [facility] [address], [place], [place] [address]     Special Requests   Final    NONE Performed at [facility], [place] [address] [place], [place] [address]    Culture (A)  Final    60,000 COLONIES/mL PSEUDOMONAS AERUGINOSA CULTURE REINCUBATED FOR BETTER GROWTH SUSCEPTIBILITIES TO FOLLOW Performed at [facility], [place] [address], [place], [place] [address]    Report Status PENDING  Incomplete          Radiology Studies: CT CHEST WO CONTRAST  Result Date: 10/07/2021 CLINICAL DATA:  Chest pain, cough, shortness of breath. Calcification or ossification, muscle EXAM: CT CHEST WITHOUT CONTRAST TECHNIQUE: Multidetector CT imaging of the chest was performed following the standard protocol without IV contrast. COMPARISON:  CT 03/01/2021. Chest x-ray 10/01/2021. CT abdomen 10/01/2021  FINDINGS: Cardiovascular: Heart size within normal limits. No pericardial effusion. Thoracic aorta is nonaneurysmal. Scattered atherosclerotic calcifications of the aorta and coronary arteries. Mild prominence of the right and left main pulmonary arteries. Mediastinum/Nodes: No enlarged mediastinal or axillary lymph nodes. Thyroid gland, trachea, and esophagus demonstrate no significant findings. Lungs/Pleura: Scattered foci of centrilobular ground-glass nodularity seen within both lungs may be minimally improved compared to CT 03/01/2021. No new focal airspace consolidation. Minimal bibasilar scarring or atelectasis. No pleural effusion or pneumothorax. Upper Abdomen: Layering hyperdense fluid within the gallbladder lumen is likely related to vicarious excretion of IV contrast from recent CT scan. No acute findings within the included upper abdomen. Unchanged appearance of the bilateral adrenal glands and visualized kidneys. Musculoskeletal: New subacute appearing fracture of the manubrial body with developing callus formation (series 6, image 84). Mild superior endplate compression fractures of T1 and T2 are new from 03/01/2021. Mild superior  endplate compression fracture of T11 is again seen without progressive height loss from previous CT 10/01/2021. No chest wall hematoma or other abnormality. A loop recorder device is noted within the left chest wall. IMPRESSION: 1. New subacute-appearing fracture of the manubrial body with developing callus formation. 2. Mild superior endplate compression fractures of T1 and T2 are new from 03/01/2021, but also likely subacute. 3. Mild superior endplate compression fracture of T11 is again seen without progressive height loss from previous CT 10/01/2021. 4. Scattered foci of centrilobular ground-glass nodularity within both lungs may be minimally improved compared to CT 03/01/2021. Findings may reflect a chronic atypical infectious or inflammatory bronchiolitis. 5. Mild prominence of the right and left main pulmonary arteries, which can be seen with pulmonary arterial hypertension. 6. Aortic and coronary artery atherosclerosis (ICD10-I70.0). Electronically Signed   By: Davina Poke D.O.   On: 10/07/2021 15:15   MR HUMERUS RIGHT W WO CONTRAST  Result Date: 10/09/2021 CLINICAL DATA:  Soft tissue mass, upper arm, US/xray nondiagnostic EXAM: MRI OF THE RIGHT HUMERUS WITHOUT AND WITH CONTRAST TECHNIQUE: Multiplanar, multisequence MR imaging of the right humerus was performed before and after the administration of intravenous contrast. CONTRAST:  43mL GADAVIST GADOBUTROL 1 MMOL/ML IV SOLN COMPARISON:  Ultrasound 10/07/2021 FINDINGS: Bones/Joint/Cartilage The cortex is intact. There is no significant marrow signal alteration. Ligaments Grossly intact, suboptimal protocol for complete evaluation. Muscles and Tendons Within the brachioradialis muscle, there is a heterogeneous collection with intrinsic T1 hyperintensity and no enhancement which measures up to 4.0 x 2.8 cm in the axial dimension and 13.7 cm in craniocaudal extent (axial T2 image 31, sagittal STIR image 17). This collection spans from the level of the  distal humerus to the proximal radius across the elbow. There is extensive intramuscular edema within the upper arm. Soft tissues Extensive soft tissue edema. There is an additional fluid collection seen along the deltoid muscle proximally which has similar signal characteristics and measures 2.1 x 1.6 x 3.1 cm (axial T2 image 3, coronal stir image 21). IMPRESSION: Large intramuscular heterogeneous collection within the brachioradialis muscle, measuring 4.0 x 2.8 cm in the axial dimension and 13.7 cm in craniocaudal extent. Signal characteristics are most compatible with a hematoma. Additional collection seen along the deltoid muscle proximally which has similar signal characteristics and measures 2.1 x 1.6 x 3.1 cm. This is also favored to be a hematoma. Recommend follow-up ultrasound in 6-12 weeks or MRI in 3 months to ensure these collections are resolving. Electronically Signed   By: Maurine Simmering M.D.   On: 10/09/2021 08:03   [place]  RT UPPER EXTREM LTD SOFT TISSUE NON VASCULAR  Result Date: 10/07/2021 CLINICAL DATA:  Arm pain in the antecubital fossa EXAM: ULTRASOUND RIGHT UPPER EXTREMITY LIMITED TECHNIQUE: Ultrasound examination of the upper extremity soft tissues was performed in the area of clinical concern. COMPARISON:  None. FINDINGS: In the region of concern we demonstrate a 7.2 by 2.3 by 4.2 cm (volume = 36 cm^3) complex fluid density lesion which is possibly intramuscular or least has a thick surrounding rind. There is also an adjacent more superficial 2.1 by 0.6 by 3.6 cm (volume = 2 cm^3) collection likewise with a surrounding soft tissues which may be muscular tissues. Possibilities may include mass, infection, or hematoma. Correlate clinically and consider MRI for further workup if clinically warranted. IMPRESSION: 1. Complex cystic lesions along striated surrounding tissues which may represent muscles in the region of concern. Possibilities might include hematoma, abscess, or centrally necrotic  mass. If the cause is not obvious clinically, consider MRI. Electronically Signed   By: Van Clines M.D.   On: 10/07/2021 20:02   CT RENAL STONE STUDY  Result Date: 10/08/2021 CLINICAL DATA:  Hematuria.  Right flank pain. EXAM: CT ABDOMEN AND PELVIS WITHOUT CONTRAST TECHNIQUE: Multidetector CT imaging of the abdomen and pelvis was performed following the standard protocol without IV contrast. COMPARISON:  CT abdomen pelvis 05/16/2007 report without imaging, CT abdomen pelvis 10/01/2021 FINDINGS: Lower chest: Bilateral lower lobe subsegmental atelectasis. Cardiac findings suggestive of anemia. Hepatobiliary: No focal liver abnormality. Layering hyperdensity within the gallbladder lumen suggestive gallstones. No gallbladder wall thickening or pericholecystic fluid. No biliary dilatation. Pancreas: No focal lesion. Normal pancreatic contour. No surrounding inflammatory changes. No main pancreatic ductal dilatation. Spleen: Normal in size without focal abnormality. A splenule is noted. Adrenals/Urinary Tract: No adrenal nodule bilaterally. Bilateral renal cortical scarring. No nephrolithiasis and no hydronephrosis. There is a 4.1 cm fluid density lesion within the left kidney that likely represents a simple renal cyst. Interval development of a 2 x 1.6 x 2.6 cm hyperdense heterogeneous lesion within the right kidney. No ureterolithiasis or hydroureter. Layering hyperdensity within the urinary bladder lumen likely related to excreted intravenous contrast from recently administered IV contrast on 10/06/2021 for CT neck. Stomach/Bowel: Stomach is within normal limits. No evidence of bowel wall thickening or dilatation. Diffuse sigmoid diverticulosis. The appendix not definitely identified. Vascular/Lymphatic: No abdominal aorta or iliac aneurysm. Severe atherosclerotic plaque of the aorta and its branches. No abdominal, pelvic, or inguinal lymphadenopathy. Reproductive: Status post hysterectomy. No adnexal  masses. Other: No intraperitoneal free fluid. No intraperitoneal free gas. No organized fluid collection. Musculoskeletal: No abdominal wall hernia or abnormality. No suspicious lytic or blastic osseous lesions. No acute displaced fracture. In stray shin of an age-indeterminate mild T11 compression fracture. Surgical hardware of the proximal right femoral shaft. IMPRESSION: 1. Interval development of a 2 x 1.6 x 2.6 cm hyperdensity within the right kidney that may represent blood products/renal cast (new compared to 10/01/21). 2. Cholelithiasis. 3. Colonic diverticulosis with no acute diverticulitis. 4. Otherwise limited evaluation on this noncontrast study. Electronically Signed   By: Iven Finn M.D.   On: 10/08/2021 16:09        Scheduled Meds:  buPROPion  150 mg Oral Daily   Chlorhexidine Gluconate Cloth  6 each Topical Daily   diphenhydrAMINE  12.5 mg Intravenous Once   donepezil  10 mg Oral QHS   feeding supplement  237 mL Oral TID BM   FLUoxetine  40 mg Oral Daily   folic acid  1 mg Oral Daily   levothyroxine  50 mcg Oral Q0600   memantine  10 mg Oral BID   multivitamin with minerals  1 tablet Oral Daily   pantoprazole (PROTONIX) IV  40 mg Intravenous Q12H   potassium chloride  40 mEq Oral Once   primidone  100 mg Oral Daily   And   primidone  50 mg Oral QHS   sodium chloride flush  10-40 mL Intracatheter Q12H   Continuous Infusions:  sodium chloride 10 mL/hr at 10/03/21 1041   sodium chloride     cefTRIAXone (ROCEPHIN)  IV     magnesium sulfate bolus IVPB     potassium chloride       LOS: 7 days    Time spent: 40 minutes    Irine Seal, MD [facility]   To contact the attending provider between 7A-7P or the covering provider during after hours 7P-7A, please log into the web site [email or web address] and access using universal [place] password for that web site. If you do not have the password, please call the hospital operator.  10/09/2021, 10:45 AM

## 2021-10-09 NOTE — Progress Notes (Signed)
PHARMACY NOTE -  Cefepime  Pharmacy has been assisting with dosing of cefepime for PsA UTI. Dosage remains stable at 2g IV q24 hr and further renal adjustments per institutional Pharmacy antibiotic protocol  Pharmacy will sign off, following peripherally for culture results or dose adjustments. Please reconsult if a change in clinical status warrants re-evaluation of dosage.  Reuel Boom, PharmD, BCPS [phone] 10/09/2021, 1:52 PM

## 2021-10-09 NOTE — Progress Notes (Signed)
Hb responded very well to transfusion Foley minimal old blood PSEUDOMONAS IN URINE NEEDS TO BE TREATED SINCE IT MAY BE SOURCE OF BLEEDING - KIDNEY INFECTION See Dr MacMacdiarmid as an outpt

## 2021-10-09 NOTE — H&P (View-Only) (Signed)
Patient ID: REGINNA SERMENO, female   DOB: 1941-11-05, 80 y.o.   MRN: [id number]    Progress Note   Subjective   Day # 7  CC; weakness, dysphagia/odynophagia, nausea, vomiting  Hemoglobin has dropped over the past couple of days, down to 6.0 last p.m. transfused and up to 11.2 today Continues to have hematuria Urine culture pending  Potassium 3.0 BUN 9/creatinine 0.74  Patient says she does not feel well but is unable to be specific, when asked directly she says she still has discomfort with swallowing and a sensation that food is sticking.  Her husband who is at bedside says she did not need anything other than part of a container of applesauce yesterday with her meds.    Objective   Vital signs in last 24 hours: Temp:  [97.2 F (36.2 C)-98.6 F (37 C)] 98.5 F (36.9 C) (10/13 0544) Pulse Rate:  [80-85] 83 (10/13 0544) Resp:  [17-18] 18 (10/13 0544) BP: (144-177)/(64-78) 155/72 (10/13 0544) SpO2:  [95 %-99 %] 97 % (10/13 0544) Last BM Date: 10/04/21 General: Elderly frail-appearing white female in NAD Heart:  Regular rate and rhythm; no murmurs Lungs: Respirations even and unlabored, lungs CTA bilaterally Abdomen:  Soft, nontender and nondistended. Normal bowel sounds. Extremities: Right upper extremity with significant bruising and swelling of the upper forearm, distal to the midline Neurologic:  Alert and orientedx2  . Psych:  Cooperative. Normal mood and affect.  Intake/Output from previous day: 10/12 0701 - 10/13 0700 In: 2345.4 [P.O.:20; I.V.:1588.6; Blood:622.5; IV Piggyback:74.3] Out: 2350 [Urine:2350] Intake/Output this shift: No intake/output data recorded.  Lab Results: Recent Labs    10/07/21 0323 10/08/21 0427 10/08/21 1553 10/09/21 0442  WBC 9.1 8.1  --  7.1  HGB 8.9* 7.3* 6.0* 11.2*  HCT 25.4* 21.1* 17.5* 32.2*  PLT 148* 136*  --  124*   BMET Recent Labs    10/07/21 0323 10/08/21 0427 10/09/21 0442  NA 134* 135 139  K 3.7 3.4* 3.0*  CL 103  104 104  CO2 17* 18* 23  GLUCOSE 99 106* 95  BUN 14 13 9   CREATININE 0.74 0.71 0.74  CALCIUM 8.0* 8.1* 8.0*   LFT Recent Labs    10/09/21 0442  PROT 5.0*  ALBUMIN 2.4*  AST 51*  ALT 25  ALKPHOS 49  BILITOT 1.1   PT/INR No results for input(s): LABPROT, INR in the last 72 hours.  Studies/Results: CT CHEST WO CONTRAST  Result Date: 10/07/2021 CLINICAL DATA:  Chest pain, cough, shortness of breath. Calcification or ossification, muscle EXAM: CT CHEST WITHOUT CONTRAST TECHNIQUE: Multidetector CT imaging of the chest was performed following the standard protocol without IV contrast. COMPARISON:  CT 03/01/2021. Chest x-ray 10/01/2021. CT abdomen 10/01/2021 FINDINGS: Cardiovascular: Heart size within normal limits. No pericardial effusion. Thoracic aorta is nonaneurysmal. Scattered atherosclerotic calcifications of the aorta and coronary arteries. Mild prominence of the right and left main pulmonary arteries. Mediastinum/Nodes: No enlarged mediastinal or axillary lymph nodes. Thyroid gland, trachea, and esophagus demonstrate no significant findings. Lungs/Pleura: Scattered foci of centrilobular ground-glass nodularity seen within both lungs may be minimally improved compared to CT 03/01/2021. No new focal airspace consolidation. Minimal bibasilar scarring or atelectasis. No pleural effusion or pneumothorax. Upper Abdomen: Layering hyperdense fluid within the gallbladder lumen is likely related to vicarious excretion of IV contrast from recent CT scan. No acute findings within the included upper abdomen. Unchanged appearance of the bilateral adrenal glands and visualized kidneys. Musculoskeletal: New subacute appearing fracture of the  manubrial body with developing callus formation (series 6, image 84). Mild superior endplate compression fractures of T1 and T2 are new from 03/01/2021. Mild superior endplate compression fracture of T11 is again seen without progressive height loss from previous CT  10/01/2021. No chest wall hematoma or other abnormality. A loop recorder device is noted within the left chest wall. IMPRESSION: 1. New subacute-appearing fracture of the manubrial body with developing callus formation. 2. Mild superior endplate compression fractures of T1 and T2 are new from 03/01/2021, but also likely subacute. 3. Mild superior endplate compression fracture of T11 is again seen without progressive height loss from previous CT 10/01/2021. 4. Scattered foci of centrilobular ground-glass nodularity within both lungs may be minimally improved compared to CT 03/01/2021. Findings may reflect a chronic atypical infectious or inflammatory bronchiolitis. 5. Mild prominence of the right and left main pulmonary arteries, which can be seen with pulmonary arterial hypertension. 6. Aortic and coronary artery atherosclerosis (ICD10-I70.0). Electronically Signed   By: Davina Poke D.O.   On: 10/07/2021 15:15   MR HUMERUS RIGHT W WO CONTRAST  Result Date: 10/09/2021 CLINICAL DATA:  Soft tissue mass, upper arm, US/xray nondiagnostic EXAM: MRI OF THE RIGHT HUMERUS WITHOUT AND WITH CONTRAST TECHNIQUE: Multiplanar, multisequence MR imaging of the right humerus was performed before and after the administration of intravenous contrast. CONTRAST:  56mL GADAVIST GADOBUTROL 1 MMOL/ML IV SOLN COMPARISON:  Ultrasound 10/07/2021 FINDINGS: Bones/Joint/Cartilage The cortex is intact. There is no significant marrow signal alteration. Ligaments Grossly intact, suboptimal protocol for complete evaluation. Muscles and Tendons Within the brachioradialis muscle, there is a heterogeneous collection with intrinsic T1 hyperintensity and no enhancement which measures up to 4.0 x 2.8 cm in the axial dimension and 13.7 cm in craniocaudal extent (axial T2 image 31, sagittal STIR image 17). This collection spans from the level of the distal humerus to the proximal radius across the elbow. There is extensive intramuscular edema within  the upper arm. Soft tissues Extensive soft tissue edema. There is an additional fluid collection seen along the deltoid muscle proximally which has similar signal characteristics and measures 2.1 x 1.6 x 3.1 cm (axial T2 image 3, coronal stir image 21). IMPRESSION: Large intramuscular heterogeneous collection within the brachioradialis muscle, measuring 4.0 x 2.8 cm in the axial dimension and 13.7 cm in craniocaudal extent. Signal characteristics are most compatible with a hematoma. Additional collection seen along the deltoid muscle proximally which has similar signal characteristics and measures 2.1 x 1.6 x 3.1 cm. This is also favored to be a hematoma. Recommend follow-up ultrasound in 6-12 weeks or MRI in 3 months to ensure these collections are resolving. Electronically Signed   By: Maurine Simmering M.D.   On: 10/09/2021 08:03   [place] RT UPPER EXTREM LTD SOFT TISSUE NON VASCULAR  Result Date: 10/07/2021 CLINICAL DATA:  Arm pain in the antecubital fossa EXAM: ULTRASOUND RIGHT UPPER EXTREMITY LIMITED TECHNIQUE: Ultrasound examination of the upper extremity soft tissues was performed in the area of clinical concern. COMPARISON:  None. FINDINGS: In the region of concern we demonstrate a 7.2 by 2.3 by 4.2 cm (volume = 36 cm^3) complex fluid density lesion which is possibly intramuscular or least has a thick surrounding rind. There is also an adjacent more superficial 2.1 by 0.6 by 3.6 cm (volume = 2 cm^3) collection likewise with a surrounding soft tissues which may be muscular tissues. Possibilities may include mass, infection, or hematoma. Correlate clinically and consider MRI for further workup if clinically warranted. IMPRESSION: 1. Complex  cystic lesions along striated surrounding tissues which may represent muscles in the region of concern. Possibilities might include hematoma, abscess, or centrally necrotic mass. If the cause is not obvious clinically, consider MRI. Electronically Signed   By: Van Clines  M.D.   On: 10/07/2021 20:02   CT RENAL STONE STUDY  Result Date: 10/08/2021 CLINICAL DATA:  Hematuria.  Right flank pain. EXAM: CT ABDOMEN AND PELVIS WITHOUT CONTRAST TECHNIQUE: Multidetector CT imaging of the abdomen and pelvis was performed following the standard protocol without IV contrast. COMPARISON:  CT abdomen pelvis 05/16/2007 report without imaging, CT abdomen pelvis 10/01/2021 FINDINGS: Lower chest: Bilateral lower lobe subsegmental atelectasis. Cardiac findings suggestive of anemia. Hepatobiliary: No focal liver abnormality. Layering hyperdensity within the gallbladder lumen suggestive gallstones. No gallbladder wall thickening or pericholecystic fluid. No biliary dilatation. Pancreas: No focal lesion. Normal pancreatic contour. No surrounding inflammatory changes. No main pancreatic ductal dilatation. Spleen: Normal in size without focal abnormality. A splenule is noted. Adrenals/Urinary Tract: No adrenal nodule bilaterally. Bilateral renal cortical scarring. No nephrolithiasis and no hydronephrosis. There is a 4.1 cm fluid density lesion within the left kidney that likely represents a simple renal cyst. Interval development of a 2 x 1.6 x 2.6 cm hyperdense heterogeneous lesion within the right kidney. No ureterolithiasis or hydroureter. Layering hyperdensity within the urinary bladder lumen likely related to excreted intravenous contrast from recently administered IV contrast on 10/06/2021 for CT neck. Stomach/Bowel: Stomach is within normal limits. No evidence of bowel wall thickening or dilatation. Diffuse sigmoid diverticulosis. The appendix not definitely identified. Vascular/Lymphatic: No abdominal aorta or iliac aneurysm. Severe atherosclerotic plaque of the aorta and its branches. No abdominal, pelvic, or inguinal lymphadenopathy. Reproductive: Status post hysterectomy. No adnexal masses. Other: No intraperitoneal free fluid. No intraperitoneal free gas. No organized fluid collection.  Musculoskeletal: No abdominal wall hernia or abnormality. No suspicious lytic or blastic osseous lesions. No acute displaced fracture. In stray shin of an age-indeterminate mild T11 compression fracture. Surgical hardware of the proximal right femoral shaft. IMPRESSION: 1. Interval development of a 2 x 1.6 x 2.6 cm hyperdensity within the right kidney that may represent blood products/renal cast (new compared to 10/01/21). 2. Cholelithiasis. 3. Colonic diverticulosis with no acute diverticulitis. 4. Otherwise limited evaluation on this noncontrast study. Electronically Signed   By: Iven Finn M.D.   On: 10/08/2021 16:09       Assessment / Plan:    #51 80 year old white female with complaints of dysphagia, odynophagia, nausea, poor appetite he really has not been eating anything since admission. Etiology of symptoms is not clear, work-up to date unrevealing as to cause of the above symptoms.  Continue PPI therapy Plan to proceed with EGD today   Will await results of the EGD today, she needs alternative means of nutrition at this point either TPN or consideration of enteral feedings versus PEG.  Primary service can decide with the family regarding nutritional support options.  #2 acute kidney injury on chronic kidney disease stage III #3 gross hematuria with drop in hemoglobin requiring transfusion #4 normocytic anemia significant drop since admission questional secondary to hematuria counts much improved post transfusions last p.m. 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  Agree with interval progress note.  Now for upper endoscopy.  Docia Chuck. Geri [place]., M.D. [facility] Division of Gastroenterology

## 2021-10-09 NOTE — Evaluation (Signed)
Physical Therapy Evaluation Patient Details Name: Nicole Ruiz MRN: [id number] DOB: Jun 09, 1941 Today's Date: 10/09/2021  History of Present Illness  80 yo female presents to [facility] on 10/5 with N/V, poor intake, dehydration. Workup for dysphagia, awaiting upper endoscopy 10/13, UTI now resolved. CT chest shows subacute appearing fracture of manubrium body with callus formation, mild superior endplate compression fractures T1, T2, and T11. CT abdomen shows 4 cm cyst in L kidney, new hpyerdense heterogeneous lesion in R kidney. RUE concern for complex cystic lesions, + edema and rubor. PMH includes HTN, HLD, CKD II, depression., ? dementia.   Clinical Impression  Pt presents with generalized weakness, difficulty performing mobility tasks, and impaired activity tolerance. Pt to benefit from acute PT to address deficits. Pt tolerated rolling and repositioning in bed only today, requiring mod-max assist to perform. PT discussed ST-SNF recommendation with pt, pt and husband agree pt would prefer to go home. PT to progress mobility as tolerated, and will continue to follow acutely.         Recommendations for follow up therapy are one component of a multi-disciplinary discharge planning process, led by the attending physician.  Recommendations may be updated based on patient status, additional functional criteria and insurance authorization.  Follow Up Recommendations SNF (pt likely to refuse - HHPT)    Equipment Recommendations  3in1 (PT)    Recommendations for Other Services       Precautions / Restrictions Precautions Precautions: Fall;Back Precaution Comments: educated on log roll given spinal fractures Restrictions Weight Bearing Restrictions: No      Mobility  Bed Mobility Overal bed mobility: Needs Assistance Bed Mobility: Rolling Rolling: Mod assist         General bed mobility comments: mod assist for roll towards R for truncal and LE translation, further mobility declined  by pt due to severe pain and fatigue. Boost up in bed with trendelenburg and bed pads, pt assisting via knee flexion>extension.    Transfers                 General transfer comment: NT today  Ambulation/Gait                Stairs            Wheelchair Mobility    Modified Rankin (Stroke Patients Only)       Balance Overall balance assessment: Needs assistance;History of Falls     Sitting balance - Comments: unable to reach EOB                                     Pertinent Vitals/Pain Pain Assessment: Faces Faces Pain Scale: Hurts even more Pain Location: RUE, back Pain Descriptors / Indicators: Sore Pain Intervention(s): Limited activity within patient's tolerance;Monitored during session;Repositioned    Home Living Family/patient expects to be discharged to:: Private residence Living Arrangements: Spouse/significant other Available Help at Discharge: Family;Available 24 hours/day Type of Home: House Home Access: Stairs to enter Entrance Stairs-Rails: Right;Left;Can reach both Entrance Stairs-Number of Steps: 3+3 Home Layout: One level;Other (Comment) Home Equipment: Walker - 2 wheels;Bedside commode;Wheelchair - manual      Prior Function Level of Independence: Needs assistance   Gait / Transfers Assistance Needed: Pt states she uses RW or cane for home and community distances  ADL's / Homemaking Assistance Needed: Pt states she is able to dress and bathe self, but husband does cooking and cleaning  Hand Dominance   Dominant Hand: Right    Extremity/Trunk Assessment   Upper Extremity Assessment Upper Extremity Assessment: Defer to OT evaluation    Lower Extremity Assessment Lower Extremity Assessment: Generalized weakness    Cervical / Trunk Assessment Cervical / Trunk Assessment: Other exceptions Cervical / Trunk Exceptions: bed-level assessment, difficult to assess  Communication   Communication: No  difficulties  Cognition Arousal/Alertness: Awake/alert (drowsy) Behavior During Therapy: Flat affect Overall Cognitive Status: Impaired/Different from baseline Area of Impairment: Problem solving;Following commands                       Following Commands: Follows one step commands with increased time     Problem Solving: Slow processing;Requires verbal cues;Requires tactile cues;Difficulty sequencing        General Comments      Exercises General Exercises - Lower Extremity Ankle Circles/Pumps: AROM;Both;5 reps;Supine Heel Slides: AROM;Both;5 reps;Supine   Assessment/Plan    PT Assessment Patient needs continued PT services  PT Problem List Decreased strength;Decreased mobility;Decreased safety awareness;Decreased balance;Decreased knowledge of use of DME;Pain;Decreased activity tolerance       PT Treatment Interventions DME instruction;Therapeutic activities;Gait training;Therapeutic exercise;Patient/family education;Balance training;Functional mobility training;Stair training    PT Goals (Current goals can be found in the Care Plan section)  Acute Rehab PT Goals Patient Stated Goal: go home PT Goal Formulation: With patient Time For Goal Achievement: 10/23/21 Potential to Achieve Goals: Good    Frequency Min 3X/week   Barriers to discharge        Co-evaluation               AM-PAC PT "6 Clicks" Mobility  Outcome Measure Help needed turning from your back to your side while in a flat bed without using bedrails?: A Little Help needed moving from lying on your back to sitting on the side of a flat bed without using bedrails?: A Little Help needed moving to and from a bed to a chair (including a wheelchair)?: A Little Help needed standing up from a chair using your arms (e.g., wheelchair or bedside chair)?: A Little Help needed to walk in hospital room?: A Little Help needed climbing 3-5 steps with a railing? : A Lot 6 Click Score: 17    End of  Session   Activity Tolerance: Patient limited by fatigue Patient left: in bed;with call bell/phone within reach;with bed alarm set;with family/visitor present;Other (comment) (MD in room) Nurse Communication: Mobility status PT Visit Diagnosis: Other abnormalities of gait and mobility (R26.89);Muscle weakness (generalized) (M62.81)    Time: 1030-1314 PT Time Calculation (min) (ACUTE ONLY): 16 min   Charges:   PT Evaluation $PT Eval Low Complexity: 1 Low         Sarya Linenberger S, PT DPT Acute Rehabilitation Services Pager [phone]  Office [phone]   [place] 10/09/2021, 11:30 AM

## 2021-10-09 NOTE — Progress Notes (Signed)
OT Cancellation Note  Patient Details Name: Nicole Ruiz MRN: [id number] DOB: 1941-06-01   Cancelled Treatment:    Reason Eval/Treat Not Completed: Patient at procedure or test/ unavailable  Traeson Dusza L Levan Aloia 10/09/2021, 1:58 PM

## 2021-10-09 NOTE — Interval H&P Note (Signed)
History and Physical Interval Note:  10/09/2021 2:03 PM  Nicole Ruiz  has presented today for surgery, with the diagnosis of dysphagia/odynophagia, nausea vomiting.  The various methods of treatment have been discussed with the patient and family. After consideration of risks, benefits and other options for treatment, the patient has consented to  Procedure(s): ESOPHAGOGASTRODUODENOSCOPY (EGD) WITH PROPOFOL (N/A) as a surgical intervention.  The patient's history has been reviewed, patient examined, no change in status, stable for surgery.  I have reviewed the patient's chart and labs.  Questions were answered to the patient's satisfaction.     [name]

## 2021-10-09 NOTE — Anesthesia Preprocedure Evaluation (Addendum)
Anesthesia Evaluation  Patient identified by MRN, date of birth, ID band Patient awake    Reviewed: Allergy & Precautions, NPO status , Patient's Chart, lab work & pertinent test results, reviewed documented beta blocker date and time   Airway Mallampati: III  TM Distance: >3 FB Neck ROM: Full    Dental no notable dental hx. (+) Edentulous Upper, Edentulous Lower   Pulmonary neg pulmonary ROS,    Pulmonary exam normal breath sounds clear to auscultation       Cardiovascular hypertension, Pt. on medications and Pt. on home beta blockers Normal cardiovascular exam Rhythm:Regular Rate:Normal     Neuro/Psych PSYCHIATRIC DISORDERS Depression Dementia CVA (eliquis, CVA 2 years ago), No Residual Symptoms    GI/Hepatic Neg liver ROS, GERD  Medicated and Controlled,Dysphagia, N/V   Endo/Other  Hypothyroidism   Renal/GU negative Renal ROS  Female GU complaint     Musculoskeletal  (+) Arthritis , Osteoarthritis,    Abdominal   Peds  Hematology  (+) Blood dyscrasia, anemia , Hb 6 on arrival, up to 11.2 this AM   Anesthesia Other Findings gross hematuria and low hemoglobin on admission s/p nausea and vomiting for 3 to 4 days.  Unable to swallow for barium study   Reproductive/Obstetrics negative OB ROS                            Anesthesia Physical Anesthesia Plan  ASA: 4  Anesthesia Plan: MAC   Post-op Pain Management:    Induction:   PONV Risk Score and Plan: 2 and Propofol infusion and TIVA  Airway Management Planned: Natural Airway and Simple Face Mask  Additional Equipment: None  Intra-op Plan:   Post-operative Plan:   Informed Consent: I have reviewed the patients History and Physical, chart, labs and discussed the procedure including the risks, benefits and alternatives for the proposed anesthesia with the patient or authorized representative who has indicated his/her understanding  and acceptance.     Dental advisory given and Consent reviewed with POA  Plan Discussed with: CRNA  Anesthesia Plan Comments: (D/w husband at bedside )      Anesthesia Quick Evaluation

## 2021-10-10 ENCOUNTER — Encounter (HOSPITAL_COMMUNITY): Payer: Self-pay | Admitting: Internal Medicine

## 2021-10-10 DIAGNOSIS — R112 Nausea with vomiting, unspecified: Secondary | ICD-10-CM | POA: Diagnosis not present

## 2021-10-10 DIAGNOSIS — N179 Acute kidney failure, unspecified: Secondary | ICD-10-CM | POA: Diagnosis not present

## 2021-10-10 DIAGNOSIS — E860 Dehydration: Secondary | ICD-10-CM | POA: Diagnosis not present

## 2021-10-10 DIAGNOSIS — D529 Folate deficiency anemia, unspecified: Secondary | ICD-10-CM | POA: Diagnosis not present

## 2021-10-10 LAB — BPAM RBC
Blood Product Expiration Date: 202211052359
Blood Product Expiration Date: 202211052359
ISSUE DATE / TIME: 202210121949
ISSUE DATE / TIME: 202210130038
Unit Type and Rh: 6200
Unit Type and Rh: 6200

## 2021-10-10 LAB — BASIC METABOLIC PANEL
Anion gap: 14 (ref 5–15)
BUN: 8 mg/dL (ref 8–23)
CO2: 18 mmol/L — ABNORMAL LOW (ref 22–32)
Calcium: 7.3 mg/dL — ABNORMAL LOW (ref 8.9–10.3)
Chloride: 102 mmol/L (ref 98–111)
Creatinine, Ser: 0.66 mg/dL (ref 0.44–1.00)
GFR, Estimated: >60 mL/min (ref >60)
Glucose, Bld: 85 mg/dL (ref 70–99)
Potassium: 3.2 mmol/L — ABNORMAL LOW (ref 3.5–5.1)
Sodium: 134 mmol/L — ABNORMAL LOW (ref 135–145)

## 2021-10-10 LAB — TYPE AND SCREEN
ABO/RH(D): A POS
Antibody Screen: NEGATIVE
Unit division: 0
Unit division: 0

## 2021-10-10 LAB — CBC WITH DIFFERENTIAL/PLATELET
Abs Immature Granulocytes: 0.14 10*3/uL — ABNORMAL HIGH (ref 0.00–0.07)
Basophils Absolute: 0 10*3/uL (ref 0.0–0.1)
Basophils Relative: 0 %
Eosinophils Absolute: 0 10*3/uL (ref 0.0–0.5)
Eosinophils Relative: 0 %
HCT: 31.8 % — ABNORMAL LOW (ref 36.0–46.0)
Hemoglobin: 10.9 g/dL — ABNORMAL LOW (ref 12.0–15.0)
Immature Granulocytes: 2 %
Lymphocytes Relative: 10 %
Lymphs Abs: 0.8 10*3/uL (ref 0.7–4.0)
MCH: 29.5 pg (ref 26.0–34.0)
MCHC: 34.3 g/dL (ref 30.0–36.0)
MCV: 86.2 fL (ref 80.0–100.0)
Monocytes Absolute: 0.8 10*3/uL (ref 0.1–1.0)
Monocytes Relative: 10 %
Neutro Abs: 6.9 10*3/uL (ref 1.7–7.7)
Neutrophils Relative %: 78 %
Platelets: 122 10*3/uL — ABNORMAL LOW (ref 150–400)
RBC: 3.69 MIL/uL — ABNORMAL LOW (ref 3.87–5.11)
RDW: 15.1 % (ref 11.5–15.5)
WBC: 8.7 10*3/uL (ref 4.0–10.5)
nRBC: 0.3 % — ABNORMAL HIGH (ref 0.0–0.2)

## 2021-10-10 LAB — URINE CULTURE: Culture: 60000 — AB

## 2021-10-10 LAB — MAGNESIUM: Magnesium: 1.9 mg/dL (ref 1.7–2.4)

## 2021-10-10 MED ORDER — SODIUM BICARBONATE 8.4 % IV SOLN
INTRAVENOUS | Status: DC
  Filled 2021-10-10: qty 150
  Filled 2021-10-10: qty 1000
  Filled 2021-10-10 (×2): qty 150

## 2021-10-10 MED ORDER — RESOURCE INSTANT PROTEIN PO PWD PACKET
1.0000 | Freq: Three times a day (TID) | ORAL | Status: DC
  Administered 2021-10-11 – 2021-10-16 (×5): 6 g via ORAL
  Filled 2021-10-10 (×19): qty 6

## 2021-10-10 MED ORDER — POTASSIUM CHLORIDE 10 MEQ/100ML IV SOLN
10.0000 meq | INTRAVENOUS | Status: AC
  Administered 2021-10-10 (×4): 10 meq via INTRAVENOUS
  Filled 2021-10-10: qty 100

## 2021-10-10 MED ORDER — FLUCONAZOLE IN SODIUM CHLORIDE 200-0.9 MG/100ML-% IV SOLN
200.0000 mg | INTRAVENOUS | Status: DC
Start: 2021-10-10 — End: 2021-10-15
  Administered 2021-10-10 – 2021-10-14 (×5): 200 mg via INTRAVENOUS
  Filled 2021-10-10 (×6): qty 100

## 2021-10-10 MED ORDER — FOLIC ACID 5 MG/ML IJ SOLN
1.0000 mg | Freq: Every day | INTRAMUSCULAR | Status: DC
  Administered 2021-10-11 – 2021-10-15 (×5): 1 mg via INTRAVENOUS
  Filled 2021-10-10 (×6): qty 0.2

## 2021-10-10 MED ORDER — LEVOTHYROXINE SODIUM 100 MCG/5ML IV SOLN
25.0000 ug | Freq: Every day | INTRAVENOUS | Status: DC
  Administered 2021-10-11 – 2021-10-15 (×5): 25 ug via INTRAVENOUS
  Filled 2021-10-10 (×5): qty 5

## 2021-10-10 NOTE — Progress Notes (Signed)
While attempting to pass meds, patient was transferring back to bed with NT and husband. Max assist and unable to stand up on her own. We used stedy to get her back to bed. I asked patient if she needed her meds to be crushed. I crushed two meds and had her take a bit with applesauce. Shortly after she states it is stuck in her throat and she is not able to take the remainder of her meds. I assisted her with taking a sip of water to wash down meds. She agreed. Unable to give her more medications and additional food.

## 2021-10-10 NOTE — Progress Notes (Signed)
Nutrition Follow-up  INTERVENTION:   -48 hour Calorie Count ordered  -Recommend bowel regimen  -Ensure Plus PO TID, each provides 350 kcals and 13g protein  (Ensure Enlive also on unit that has 20g protein) -Strawberry only  -Beneprotein powder TID mixed with applesauce, provides 25 kcals and 6g protein  -Multivitamin with minerals daily  -Encouraged PO intakes (goal is to drink 2 Ensures today)  If PO does not improve, TF recommendations: -Monitor magnesium, potassium, and phosphorus daily for at least 3 days, MD to replete as needed, as pt is at risk for refeeding syndrome  -Osmolite 1.2 @ 20 ml/hr, advance by 10 ml every 12 hours to goal rate of 60 ml/hr.  NUTRITION DIAGNOSIS:   Inadequate oral intake related to nausea as evidenced by per patient/family report.  Ongoing.  GOAL:   Patient will meet greater than or equal to 90% of their needs  Not meeting.  MONITOR:   Diet advancement, PO intake, Supplement acceptance, Labs, Weight trends, I & O's  REASON FOR ASSESSMENT:   Consult Assessment of nutrition requirement/status, Calorie Count  ASSESSMENT:   80 yo female with a PMH of HTN, HLD, CKD stage 2, and depression who presents with intractable nausea vomiting and poor p.o. intake with dehydration over the past 3 to 4 days. Admitted with UTI, acute metabolic encephalopathy, and AKI  Patient in room with no family at bedside. Pt reports she has a complaint regarding some of the items that come on her trays. She absolutely does not like the fruit ices. Will add these to her dislikes in menu ordering system. She has not consumed anything today. Per RN, she won't eat anything for her.   Pt during my visit was agreeable to a strawberry Ensure and trying Beneprotein powder in applesauce. If pt consumed these supplements (Ensure TID, Beneprotein TID) this would provide 1125 kcals and 57g protein). Pt stated initially she was interested in having a feeding tube if needed but  during RD visit then said she is not interested in NGT placement.   Ordered Calorie Count, explained to pt that staff will be tracking her intakes. Pt agreed to our plan of trying to drink 2 Ensures today.  Pt did c/o abdominal pain. Has not had a BM since 10/8. May need a bowel regimen.  Admission weight: 127 lbs. No other weights for this admission.  Medications: Folic acid, Multivitamin with minerals daily, KCl  Labs reviewed:  Low Na, K  NUTRITION - FOCUSED PHYSICAL EXAM:  Pt deferred. Noticeably deconditioned.  Diet Order:   Diet Order             Diet full liquid Room service appropriate? Yes; Fluid consistency: Thin  Diet effective 0500 tomorrow                   EDUCATION NEEDS:   Education needs have been addressed  Skin:  Skin Assessment: Reviewed RN Assessment  Last BM:  10/8  Height:   Ht Readings from Last 1 Encounters:  10/01/21 5\' 3"  (1.6 m)    Weight:   Wt Readings from Last 1 Encounters:  10/01/21 57.7 kg   BMI:  Body mass index is 22.53 kg/m.  Estimated Nutritional Needs:   Kcal:  2751-7001  Protein:  70-85 grams  Fluid:  >1.8 L    Clayton Bibles, MS, RD, LDN Inpatient Clinical Dietitian Contact information available via Amion

## 2021-10-10 NOTE — Evaluation (Signed)
Occupational Therapy Evaluation Patient Details Name: Nicole Ruiz MRN: [id number] DOB: 1941/01/25 Today's Date: 10/10/2021   History of Present Illness 80 yo female [facility] on 10/[facility] with N/V, poor intake, dehydration. Workup for dysphagia, awaiting upper endoscopy 10/13, UTI now resolved. CT chest shows subacute appearing fracture of manubrium body with callus formation, mild superior endplate compression fractures T1, T2, and T11. CT abdomen shows 4 cm cyst in L kidney, new hpyerdense heterogeneous lesion in R kidney. RUE concern for complex cystic lesions, + edema and rubor. PMH includes HTN, HLD, CKD II, depression, dementia.   Clinical Impression   PTA, pt was living with husband at home, reliant on husband for assistance with ADLs and IADLs for past 3 weeks, and reports using a RW. Upon evaluation, pt with decreased balance, myoclonus in UEs with movement, dizziness with standing, and decreased cognition limiting functional abilities. Pt currently requiring set up for UB ADLs, Max A +2 for LB ADLs, and Mod A +2 for stand pivot to recliner with RW. Pt attempted drinking orange and grape juice but with coughing/gagging after swallowing. Patient will benefit from skilled OT services while in hospital to improve deficits and learn compensatory strategies as needed in order to return to PLOF. Recommending SNF rehab due to current level of function, however, pt and husband will likely refuse despite education on level of care needed.     Recommendations for follow up therapy are one component of a multi-disciplinary discharge planning process, led by the attending physician.  Recommendations may be updated based on patient status, additional functional criteria and insurance authorization.   Follow Up Recommendations  SNF    Equipment Recommendations  Other (comment) (TBD)    Recommendations for Other Services       Precautions / Restrictions Precautions Precautions:  Fall;Back Precaution Comments: log rolling Restrictions Weight Bearing Restrictions: No      Mobility Bed Mobility Overal bed mobility: Needs Assistance Bed Mobility: Rolling Rolling: Mod assist         General bed mobility comments: Mod A for push up from bed and lowering LEs off bed    Transfers Overall transfer level: Needs assistance Equipment used: Rolling walker (2 wheeled) Transfers: Stand Pivot Transfers Sit to Stand: Mod assist;+2 physical assistance;From elevated surface              Balance Overall balance assessment: Needs assistance;History of Falls Sitting-balance support: Feet supported Sitting balance-Leahy Scale: Fair     Standing balance support: Bilateral upper extremity supported Standing balance-Leahy Scale: Poor Standing balance comment: With myoclonus in UEs, LEs not present but fearful and needing to sit on initial sit to stand. required RW for support.                           ADL either performed or assessed with clinical judgement   ADL Overall ADL's : Needs assistance/impaired Eating/Feeding: Sitting;Set up   Grooming: Sitting;Set up   Upper Body Bathing: Set up;Sitting   Lower Body Bathing: Sit to/from stand;Maximal assistance;+2 for physical assistance   Upper Body Dressing : Set up;Sitting   Lower Body Dressing: Sit to/from stand;Maximal assistance Lower Body Dressing Details (indicate cue type and reason): pt unable to reach down to pull up socks, required Mod A +2 for sit to stand with external support Toilet Transfer: Moderate assistance;+2 for physical assistance;RW;BSC   Toileting- Clothing Manipulation and Hygiene: Total assistance;Sit to/from stand       Functional  mobility during ADLs: Moderate assistance;Rolling walker;Cueing for sequencing;+2 for physical assistance       Vision Patient Visual Report: No change from baseline Additional Comments: Pt able to see to read small digital time of day on  tv from across room     Perception     Praxis      Pertinent Vitals/Pain Pain Assessment: Faces Faces Pain Scale: No hurt Pain Intervention(s): Monitored during session     Hand Dominance Right   Extremity/Trunk Assessment Upper Extremity Assessment Upper Extremity Assessment: RUE deficits/detail;LUE deficits/detail RUE Deficits / Details: mild myoclonus BUEs LUE Deficits / Details: mild myoclonus BUEs   Lower Extremity Assessment Lower Extremity Assessment: Generalized weakness   Cervical / Trunk Assessment Cervical / Trunk Assessment: Normal   Communication Communication Communication: No difficulties   Cognition Arousal/Alertness: Awake/alert Behavior During Therapy: WFL for tasks assessed/performed Overall Cognitive Status: History of cognitive impairments - at baseline Area of Impairment: Following commands;Orientation;Problem solving                 Orientation Level: Disoriented to;Time;Situation     Following Commands: Follows one step commands with increased time     Problem Solving: Slow processing;Requires verbal cues;Requires tactile cues General Comments: Pt A&O x2, aware that she is at [facility], her full name, but reported it was April when asked about the current month of the year and could not give a general time of the day without verbal cues to look at clock. Pt able to recall husbands name when he entered the room.   General Comments  husband present    Exercises     Shoulder Instructions      Home Living Family/patient expects to be discharged to:: Private residence Living Arrangements: Spouse/significant other Available Help at Discharge: Family;Available 24 hours/day Type of Home: House Home Access: Stairs to enter [employer] of Steps: 3+3 Entrance Stairs-Rails: Right;Left;Can reach both Home Layout: One level;Other (Comment)     Bathroom Shower/Tub: Teacher, early years/pre: Standard     Home Equipment:  Environmental consultant - 2 wheels;Bedside commode;Wheelchair - manual   Additional Comments: Lives at home with husband      Prior Functioning/Environment Level of Independence: Needs assistance  Gait / Transfers Assistance Needed: Pt states she uses RW or cane for home and community distances ADL's / Homemaking Assistance Needed: Pt states she is able to dress and bathe self, but husband does cooking and cleaning. Husband report inconsistent-he assists with dressing and bathing as well the past 3 weeks.            OT Problem List: Decreased strength;Decreased range of motion;Impaired balance (sitting and/or standing);Pain;Decreased cognition;Decreased knowledge of use of DME or AE;Decreased knowledge of precautions      OT Treatment/Interventions: Self-care/ADL training;Therapeutic exercise;DME and/or AE instruction;Cognitive remediation/compensation;Patient/family education;Therapeutic activities    OT Goals(Current goals can be found in the care plan section) Acute Rehab OT Goals Patient Stated Goal: go home OT Goal Formulation: With patient Time For Goal Achievement: 10/24/21 Potential to Achieve Goals: Fair  OT Frequency: Min 2X/week   Barriers to D/C:    pt and husband may refuse SNF rehab       Co-evaluation              AM-PAC OT "6 Clicks" Daily Activity     Outcome Measure Help from another person eating meals?: A Little Help from another person taking care of personal grooming?: A Little Help from another person toileting, which includes using  toliet, bedpan, or urinal?: A Lot Help from another person bathing (including washing, rinsing, drying)?: A Lot Help from another person to put on and taking off regular upper body clothing?: A Little Help from another person to put on and taking off regular lower body clothing?: A Lot 6 Click Score: 15   End of Session Equipment Utilized During Treatment: Gait belt Nurse Communication: Mobility status  Activity Tolerance: Patient  tolerated treatment well Patient left: in chair;with call bell/phone within reach;with chair alarm set  OT Visit Diagnosis: Unsteadiness on feet (R26.81);Other abnormalities of gait and mobility (R26.89);History of falling (Z91.81);Muscle weakness (generalized) (M62.81);Pain                Time: 0601-5615 OT Time Calculation (min): 31 min Charges:  OT General Charges $OT Visit: 1 Visit OT Evaluation $OT Eval Low Complexity: 1 Low OT Treatments $Self Care/Home Management : 8-22 mins  Jackquline Denmark, OTS Acute Rehab Office: [phone]   Birdia Jaycox 10/10/2021, 12:35 PM

## 2021-10-10 NOTE — Progress Notes (Signed)
PROGRESS NOTE    Nicole Ruiz  IPJ:[id number] DOB: 30-Jun-1941 DOA: 10/01/2021 PCP: Bernerd Limbo, MD    No chief complaint on file.   Brief Narrative:  Nicole Ruiz is a 80 y.o. female with medical history significant of hypertension, hyperlipidemia, CKD stage II, depression who presents with intractable nausea vomiting and poor p.o. intake with dehydration over the past 3 to 4 days.  Patient denies any sick contacts recent travel or change in dietary habits.  Apparently some 4 days prior to admission she was having some mild nausea symptoms with poor p.o. intake which continued to worsen over the past 4 days now with intractable symptoms and abnormal labs as evaluated in the ED with elevated creatinine from baseline and an anion gap greater than 20. Hospitalist called to admit. Patient also with complaints of dysphagia, multiple attempts of barium swallow done unsuccessful, GI consulted and patient for probable upper endoscopy tomorrow 10/09/2021.  Patient also noted to have right upper extremity with concerns for cystic fluid collection.  MRI upper extremity pending.   Assessment & Plan:   Principal Problem:   Nausea and vomiting Active Problems:   Esophageal reflux   Clinical depression   Hypothyroid   AKI (acute kidney injury) ([place])   UTI (urinary tract infection)   Oropharyngeal dysphagia   Failure to thrive (child)   Anemia   Hematuria   Dehydration   Acute gastric ulcer without hemorrhage or perforation   Esophageal ring   Intramuscular hematoma  1 intractable nausea vomiting, unlikely infectious process, POA/dysphagia/odynophagia, ongoing -Questionable etiology. -Patient noted to have UTI on presentation and received 3 days of IV Rocephin. -Patient and husband poor historians, patient's intractable nausea vomiting concerning for possible obstructive process and is noted per husband that patient has had difficulty swallowing for over 4 weeks with poor oral  intake. -Multiple attempts at barium swallow unsuccessful, see speech evaluation note as below without any overt spillage or aspiration patient able to only take minimal oral contrast. -CT neck unremarkable for any acute findings of odynophagia/dysphagia. -CT chest with new subacute appearing fracture of the manubrium body with developing callus formation, mild superior endplate compression fractures T1 and T2 new from 03/01/2021 but also likely subacute, mild superior endplate compression fracture T11 again seen without progressive height loss from prior CT 73419 22.  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  Mild prominence of the right and left main pulmonary arteries which can be seen with pulmonary arterial hypertension.  Aortic and coronary artery atherosclerosis. -Patient seen in consultation by GI and patient started on PPI twice daily and underwent EGD 10/09/2021 with incidental large caliber distal esophageal ring, gastric ulcers otherwise clear endoscopy, no findings on endoscopy to explain current swallow issues. -Continue IV PPI.   -Patient currently not interested in PEG tube and discussions with patient.  -Palliative care consultation pending. -Trial of full liquid diet and advance as tolerated to a soft diet. -GI was following but have signed off as of 10/09/2021.  2.  Hematuria -Questionable etiology.  May be secondary to Pseudomonas UTI per urology. -Repeat UA with cultures and sensitivities consistent with a Pseudomonas UTI. -Status post 3 days IV Rocephin. -CT renal stone protocol with interval development of 2 x 1.6 x 2.6 cm hypodensity within the right kidney may represent blood product/renal cast.   -Foley catheter was placed with irrigation with hematuria  improving.  -Continue IV cefepime to treat Pseudomonas UTI.   -Urology consulted and following. -Follow.  3.  Iron deficiency anemia /folate deficiency/acute blood loss anemia -Could likely be secondary to hematuria, muscular hematoma noted in wrap upper extremity and possible GI bleed.  Patient with no overt GI bleed. -Patient seen in consultation by GI and patient status post upper endoscopy 10/09/2021 with incidental large caliber distal esophageal ring, gastric ulcers otherwise clear endoscopy, no findings on endoscopy to explain current swallow issues. -Continue PPI twice daily. -Anemia panel with iron deficiency anemia and folate deficiency with folate of 2.5, iron of 24, TIBC of 125. -Repeat H&H this afternoon. -Continue folic acid 1 mg daily which we will change to IV due to swallow difficulties. -Transfusion threshold hemoglobin < 7.  4.  Acute metabolic encephalopathy, POA resolved/baseline dementia -Patient's mental status noted to be close to baseline. -Baseline dementia suspected, unclear diagnosis. -UDS was positive for barbiturates, patient declines being on anything in this drug class, current home med list unclear whether accurate does not contain barbiturates, unclear how she is positive for this medication list false positive in the setting of high NSAID use.  5.  Right upper extremity hematoma/phlebitis -Ultrasound right upper extremity done concerning for complex cystic lesions along striated surrounding tissues may represent muscles in the region of concern, possibilities may include hematoma, abscess or centrally necrotic mass.  MRI of upper extremity pending. -See #13.  6.  Mild acute kidney injury on chronic kidney disease stage II, resolved -Renal function back to baseline. -Monitor with hydration.  7.  UTI,>>>> Pseudomonas UTI -Urine cultures not obtained on admission. -Patient is status post 3 days IV Rocephin. -Repeat UA with cultures and sensitivities consistent with a UTI with 60,000 colonies of Pseudomonas  aeruginosa and 80,000 colonies of yeast, Pseudomonas sensitive to third-generation cephalosporins, ciprofloxacin, gentamicin, imipenem, Zosyn, cefepime.  -Continue IV cefepime due to difficulty with oral intake.  -IV Diflucan x7 days.  -Urology following due to hematuria.  8.  Anion gap metabolic acidosis of unclear etiology, resolving -Unclear etiology. -BUN in the 30s on admission. -Salicylate, acetaminophen, ethanol levels unremarkable. -UDS positive for barbiturates only. -Urine had mild ketosis. -Improved with hydration.  9.  Hypomagnesemia/hypokalemia -Magnesium at 1.9, potassium at 3.2.   -KCl 10 mEq IV every hour x4 runs.   -Repeat labs in the morning.  10.  Hypothyroidism -Patient with some difficulty tolerating oral intake and as such we will change oral Synthroid to IV Synthroid.   11.??  Dementia -Continue home regimen Namenda, Aricept if able to tolerate oral medications.  12.  Depression -Continue Wellbutrin if able to tolerate oral medications.  13.  Intramuscular hematoma of right upper extremity -Patient noted with right upper extremity swelling and redness and painful. -Ultrasound done concerning for complex cystic lesions along striated surrounding tissues which may represent muscles in the region of concerns.  Possibilities included hematoma, abscess or centrally necrotic mass. -MRI of the humerus done with large intramuscular heterogeneous collection within the brachioradialis muscle measuring 4 x 2.8 cm in axial dimension and 13 x 7 cm in craniocaudal extent with characteristics most compatible with hematoma.  Similar finding noted in the deltoid muscle favored to be hematoma. -Continue warm compresses, elevation of right upper extremity.   -Midline removed 10/09/2021. -Will need follow-up ultrasound in 6 to 12 weeks or MRI in 3 months to ensure collections resolving.  14.  Goals of care -Patient with history of dementia, presenting with nausea vomiting  difficulty swallowing and  per husband poor oral intake over the past 4 weeks. -Patient with poor oral intake and poor nutrition at this time. -Patient seen by GI underwent upper endoscopy with no endoscopic findings to explain swallow issues. -Patient still with difficulty with oral intake. -Patient at this time does not seem interested in PEG tube. -Palliative care consultation for goals of care.    DVT prophylaxis: SCDs Code Status: Full Family Communication: Updated patient and husband at bedside. Disposition:   Status is: Inpatient  Remains inpatient appropriate because:Inpatient level of care appropriate due to severity of illness  Dispo: The patient is from: Home              Anticipated d/c is to:  TBD              Patient currently is not medically stable to d/c.   Difficult to place patient No       Consultants:  Gastroenterology: Dr. Henrene Pastor 10/06/2021 Palliative care pending  Procedures:  CT renal stone protocol pending 10/08/2021 CT chest without contrast 10/07/2021 CT soft tissue neck 10/06/2021 CT head 10/01/2021 Chest x-ray 10/01/2021 MBS 10/05/2021 MRI humerus 10/08/2021 Ultrasound right upper extremity soft tissue 10/07/2021 Multiple attempts at barium swallow unsuccessful EGD  10/09/2021  Antimicrobials:  IV Rocephin 10/02/2021>>>>> 10/04/2021 IV cefepime 10/09/2021>>>>>    Subjective: Laying in bed.  Patient with poor oral intake.  Patient did states she took all her medications however per RN patient with difficulty taking medications even with applesauce.  No chest pain.  No shortness of breath.  Husband at bedside.   Objective: Vitals:   10/09/21 1440 10/09/21 1450 10/09/21 1500 10/10/21 0504  BP: (!) 163/85 (!) 146/69 (!) 166/69 (!) 149/67  Pulse: 85 77 80 80  Resp: 13 16 14 18   Temp:    98.3 F (36.8 C)  TempSrc:    Oral  SpO2: 96% 98% 96% 95%  Weight:      Height:        Intake/Output Summary (Last 24 hours) at 10/10/2021 1133 Last  data filed at 10/10/2021 1000 Gross per 24 hour  Intake 2308.2 ml  Output 1025 ml  Net 1283.2 ml    Filed Weights   10/01/21 1231 10/01/21 2215  Weight: 66.4 kg 57.7 kg    Examination:  General exam: NAD.  Dry mucous membranes. Respiratory system: Lungs clear to auscultation bilaterally anterior lung fields.  No wheezes, no crackles, no rhonchi.  Normal respiratory effort.  Speaking in full sentences.  No use of accessory muscles of respiration. Cardiovascular system: Regular rate rhythm no murmurs rubs or gallops.  No JVD.  No lower extremity edema. Gastrointestinal system: Abdomen is soft, nontender, nondistended, positive bowel sounds.  No rebound.  No guarding. Central nervous system: Alert and oriented.  Moving extremities spontaneously.  No focal neurological deficits.   Extremities: Right upper extremity with some swelling, erythema in antecubital fossa area, some tenderness to palpation.  Skin: No rashes, lesions or ulcers Psychiatry: Judgement and insight appear poor to fair. Mood & affect appropriate.     Data Reviewed: I have personally reviewed following labs and imaging studies  CBC: Recent Labs  Lab 10/06/21 0342 10/07/21 0323 10/08/21 0427 10/08/21 1553 10/09/21 0442 10/10/21 0408  WBC 7.7 9.1 8.1  --  7.1 8.7  NEUTROABS  --   --   --   --  5.3 6.9  HGB 10.6* 8.9* 7.3* 6.0* 11.2* 10.9*  HCT 30.8* 25.4* 21.1* 17.5* 32.2* 31.8*  MCV 87.7 87.0  87.9  --  85.2 86.2  PLT 171 148* 136*  --  124* 122*     Basic Metabolic Panel: Recent Labs  Lab 10/06/21 0342 10/07/21 0323 10/08/21 0427 10/08/21 0848 10/09/21 0442 10/10/21 0408  NA 140 134* 135  --  139 134*  K 3.0* 3.7 3.4*  --  3.0* 3.2*  CL 108 103 104  --  104 102  CO2 16* 17* 18*  --  23 18*  GLUCOSE 107* 99 106*  --  95 85  BUN 11 14 13   --  9 8  CREATININE 0.87 0.74 0.71  --  0.74 0.66  CALCIUM 8.5* 8.0* 8.1*  --  8.0* 7.3*  MG  --   --   --  1.4* 1.8 1.9     GFR: Estimated Creatinine  Clearance: 46.4 mL/min (by C-G formula based on SCr of 0.66 mg/dL).  Liver Function Tests: Recent Labs  Lab 10/04/21 0333 10/05/21 0331 10/06/21 0342 10/07/21 0323 10/09/21 0442  AST 24 23 22 25  51*  ALT 19 18 19 18 25   ALKPHOS 59 58 53 43 49  BILITOT 1.4* 1.3* 1.2 1.4* 1.1  PROT 5.9* 6.1* 5.6* 4.9* 5.0*  ALBUMIN 2.9* 3.0* 2.8* 2.6* 2.4*     CBG: No results for input(s): GLUCAP in the last 168 hours.   Recent Results (from the past 240 hour(s))  Resp Panel by RT-PCR (Flu A&B, Covid) Nasopharyngeal Swab     Status: None   Collection Time: 10/01/21  1:43 PM   Specimen: Nasopharyngeal Swab; Nasopharyngeal(NP) swabs in vial transport medium  Result Value Ref Range Status   SARS Coronavirus 2 by RT PCR NEGATIVE NEGATIVE Final    Comment: (NOTE) SARS-CoV-2 target nucleic acids are NOT DETECTED.  The SARS-CoV-2 RNA is generally detectable in upper respiratory specimens during the acute phase of infection. The lowest concentration of SARS-CoV-2 viral copies this assay can detect is 138 copies/mL. A negative result does not preclude SARS-Cov-2 infection and should not be used as the sole basis for treatment or other patient management decisions. A negative result may occur with  improper specimen collection/handling, submission of specimen other than nasopharyngeal swab, presence of viral mutation(s) within the areas targeted by this assay, and inadequate number of viral copies(<138 copies/mL). A negative result must be combined with clinical observations, patient history, and epidemiological information. The expected result is Negative.  Fact Sheet for Patients:  EntrepreneurPulse.com.au  Fact Sheet for Healthcare Providers:  IncredibleEmployment.be  This test is no t yet approved or cleared by the [place] FDA and  has been authorized for detection and/or diagnosis of SARS-CoV-2 by FDA under an Emergency Use Authorization (EUA).  This EUA will remain  in effect (meaning this test can be used) for the duration of the COVID-19 declaration under Section 564(b)(1) of the Act, 21 U.S.C.section 360bbb-3(b)(1), unless the authorization is terminated  or revoked sooner.       Influenza A by PCR NEGATIVE NEGATIVE Final   Influenza B by PCR NEGATIVE NEGATIVE Final    Comment: (NOTE) The Xpert Xpress SARS-CoV-2/FLU/RSV plus assay is intended as an aid in the diagnosis of influenza from Nasopharyngeal swab specimens and should not be used as a sole basis for treatment. Nasal washings and aspirates are unacceptable for Xpert Xpress SARS-CoV-2/FLU/RSV testing.  Fact Sheet for Patients: EntrepreneurPulse.com.au  Fact Sheet for Healthcare Providers: IncredibleEmployment.be  This test is not yet approved or cleared by the [place] FDA and has been authorized for detection  and/or diagnosis of SARS-CoV-2 by FDA under an Emergency Use Authorization (EUA). This EUA will remain in effect (meaning this test can be used) for the duration of the COVID-19 declaration under Section 564(b)(1) of the Act, 21 U.S.C. section 360bbb-3(b)(1), unless the authorization is terminated or revoked.  Performed at [facility], [place] [address], [place], [place] [address]   Urine Culture     Status: Abnormal   Collection Time: 10/08/21 10:19 AM   Specimen: In/Out Cath Urine  Result Value Ref Range Status   Specimen Description   Final    IN/OUT CATH URINE Performed at [place] [facility] [address], [place], [place] [address]    Special Requests   Final    NONE Performed at [facility], [place] [address] [place], [place] [address]    Culture (A)  Final    60,000 COLONIES/mL PSEUDOMONAS AERUGINOSA 80,000 COLONIES/mL YEAST Standardized susceptibility testing for this organism is not available. Performed at [facility], [place] [address] [place], [place] [address]    Report Status 10/10/2021 FINAL  Final   Organism ID, Bacteria PSEUDOMONAS AERUGINOSA (A)  Final      Susceptibility   Pseudomonas aeruginosa - MIC*    CEFTAZIDIME 4 SENSITIVE Sensitive     CIPROFLOXACIN <=0.25 SENSITIVE Sensitive     GENTAMICIN <=1 SENSITIVE Sensitive     IMIPENEM 2 SENSITIVE Sensitive     PIP/TAZO 8 SENSITIVE Sensitive     CEFEPIME 2 SENSITIVE Sensitive     * 60,000 COLONIES/mL PSEUDOMONAS AERUGINOSA  Urine Culture     Status: Abnormal (Preliminary result)   Collection Time: 10/08/21  8:08 PM   Specimen: Urine, Catheterized  Result Value Ref Range Status   Specimen Description   Final    URINE, CATHETERIZED Performed at [facility], [place] [address], [place], [place] [address]    Special Requests   Final    NONE Performed at [facility], [place] [address] [place], [place] [address]    Culture (A)  Final    4,000 COLONIES/mL GRAM NEGATIVE RODS SUSCEPTIBILITIES TO FOLLOW Performed at [facility], [place] [address], [place], [place] [address]    Report Status PENDING  Incomplete          Radiology Studies: MR HUMERUS RIGHT W WO CONTRAST  Result Date: 10/09/2021 CLINICAL DATA:  Soft tissue mass, upper arm, US/xray nondiagnostic EXAM: MRI OF THE RIGHT HUMERUS WITHOUT AND WITH CONTRAST TECHNIQUE: Multiplanar, multisequence MR imaging of the right humerus was performed before and after the administration of intravenous contrast. CONTRAST:  54mL GADAVIST GADOBUTROL 1 MMOL/ML IV SOLN COMPARISON:  Ultrasound 10/07/2021 FINDINGS: Bones/Joint/Cartilage The cortex is intact. There is no significant marrow signal alteration. Ligaments Grossly intact, suboptimal protocol for complete evaluation. Muscles and Tendons Within the brachioradialis muscle, there is a heterogeneous collection with intrinsic T1 hyperintensity and no enhancement which measures up to 4.0 x 2.8 cm in the axial dimension and 13.7 cm  in craniocaudal extent (axial T2 image 31, sagittal STIR image 17). This collection spans from the level of the distal humerus to the proximal radius across the elbow. There is extensive intramuscular edema within the upper arm. Soft tissues Extensive soft tissue edema. There is an additional fluid collection seen along the deltoid muscle proximally which has similar signal characteristics and measures 2.1 x 1.6 x 3.1 cm (axial T2 image 3, coronal stir image 21). IMPRESSION: Large intramuscular heterogeneous collection within the  brachioradialis muscle, measuring 4.0 x 2.8 cm in the axial dimension and 13.7 cm in craniocaudal extent. Signal characteristics are most compatible with a hematoma. Additional collection seen along the deltoid muscle proximally which has similar signal characteristics and measures 2.1 x 1.6 x 3.1 cm. This is also favored to be a hematoma. Recommend follow-up ultrasound in 6-12 weeks or MRI in 3 months to ensure these collections are resolving. Electronically Signed   By: Maurine Simmering M.D.   On: 10/09/2021 08:03   CT RENAL STONE STUDY  Result Date: 10/08/2021 CLINICAL DATA:  Hematuria.  Right flank pain. EXAM: CT ABDOMEN AND PELVIS WITHOUT CONTRAST TECHNIQUE: Multidetector CT imaging of the abdomen and pelvis was performed following the standard protocol without IV contrast. COMPARISON:  CT abdomen pelvis 05/16/2007 report without imaging, CT abdomen pelvis 10/01/2021 FINDINGS: Lower chest: Bilateral lower lobe subsegmental atelectasis. Cardiac findings suggestive of anemia. Hepatobiliary: No focal liver abnormality. Layering hyperdensity within the gallbladder lumen suggestive gallstones. No gallbladder wall thickening or pericholecystic fluid. No biliary dilatation. Pancreas: No focal lesion. Normal pancreatic contour. No surrounding inflammatory changes. No main pancreatic ductal dilatation. Spleen: Normal in size without focal abnormality. A splenule is noted. Adrenals/Urinary  Tract: No adrenal nodule bilaterally. Bilateral renal cortical scarring. No nephrolithiasis and no hydronephrosis. There is a 4.1 cm fluid density lesion within the left kidney that likely represents a simple renal cyst. Interval development of a 2 x 1.6 x 2.6 cm hyperdense heterogeneous lesion within the right kidney. No ureterolithiasis or hydroureter. Layering hyperdensity within the urinary bladder lumen likely related to excreted intravenous contrast from recently administered IV contrast on 10/06/2021 for CT neck. Stomach/Bowel: Stomach is within normal limits. No evidence of bowel wall thickening or dilatation. Diffuse sigmoid diverticulosis. The appendix not definitely identified. Vascular/Lymphatic: No abdominal aorta or iliac aneurysm. Severe atherosclerotic plaque of the aorta and its branches. No abdominal, pelvic, or inguinal lymphadenopathy. Reproductive: Status post hysterectomy. No adnexal masses. Other: No intraperitoneal free fluid. No intraperitoneal free gas. No organized fluid collection. Musculoskeletal: No abdominal wall hernia or abnormality. No suspicious lytic or blastic osseous lesions. No acute displaced fracture. In stray shin of an age-indeterminate mild T11 compression fracture. Surgical hardware of the proximal right femoral shaft. IMPRESSION: 1. Interval development of a 2 x 1.6 x 2.6 cm hyperdensity within the right kidney that may represent blood products/renal cast (new compared to 10/01/21). 2. Cholelithiasis. 3. Colonic diverticulosis with no acute diverticulitis. 4. Otherwise limited evaluation on this noncontrast study. Electronically Signed   By: Iven Finn M.D.   On: 10/08/2021 16:09        Scheduled Meds:  buPROPion  150 mg Oral Daily   Chlorhexidine Gluconate Cloth  6 each Topical Daily   diphenhydrAMINE  12.5 mg Intravenous Once   donepezil  10 mg Oral QHS   feeding supplement  237 mL Oral TID BM   FLUoxetine  40 mg Oral Daily   folic acid  1 mg Oral  Daily   levothyroxine  50 mcg Oral Q0600   memantine  10 mg Oral BID   multivitamin with minerals  1 tablet Oral Daily   pantoprazole (PROTONIX) IV  40 mg Intravenous Q12H   primidone  100 mg Oral Daily   And   primidone  50 mg Oral QHS   sodium chloride flush  10-40 mL Intracatheter Q12H   Continuous Infusions:  sodium chloride 10 mL/hr at 10/03/21 1041   sodium chloride 75 mL/hr at 10/09/21 1117   ceFEPime (  MAXIPIME) IV 2 g (10/09/21 1526)   fluconazole (DIFLUCAN) IV     potassium chloride 10 mEq (10/10/21 0916)     LOS: 8 days    Time spent: 40 minutes    Irine Seal, MD [facility]   To contact the attending provider between 7A-7P or the covering provider during after hours 7P-7A, please log into the web site [email or web address] and access using universal [place] password for that web site. If you do not have the password, please call the hospital operator.  10/10/2021, 11:33 AM

## 2021-10-10 NOTE — Progress Notes (Addendum)
Physical Therapy Treatment Patient Details Name: Nicole Ruiz MRN: [id number] DOB: February 05, 1941 Today's Date: 10/10/2021   History of Present Illness 80 yo female presents to [facility] on 10/5 with N/V, poor intake, dehydration. Workup for dysphagia, awaiting upper endoscopy 10/13, UTI now resolved. CT chest shows subacute appearing fracture of manubrium body with callus formation, mild superior endplate compression fractures T1, T2, and T11. CT abdomen shows 4 cm cyst in L kidney, new hpyerdense heterogeneous lesion in R kidney. RUE concern for complex cystic lesions, + edema and rubor. PMH includes HTN, HLD, CKD II, depression, dementia.    PT Comments    Pt was OOB earlier via OT and now back in bed via nursing using STEDY as pt was unable to stand.  Pt did not tolerate in recliner very long due to back pain.   Performed bed level TE's B LE.    Recommendations for follow up therapy are one component of a multi-disciplinary discharge planning process, led by the attending physician.  Recommendations may be updated based on patient status, additional functional criteria and insurance authorization.  Follow Up Recommendations  SNF     Equipment Recommendations       Recommendations for Other Services       Precautions / Restrictions Precautions Precautions: Fall;Back Precaution Comments: log rolling (T 11 Comp Fx) Restrictions Weight Bearing Restrictions: No     Mobility  Bed Mobility Overal bed mobility: Needs Assistance Bed Mobility: Rolling Rolling: Mod assist         General bed mobility comments: Mod A for push up from bed and lowering LEs off bed    Transfers Overall transfer level: Needs assistance Equipment used: Rolling walker (2 wheeled) Transfers: Stand Pivot Transfers Sit to Stand: Mod assist;+2 physical assistance;From elevated surface            Ambulation/Gait                 Stairs             Wheelchair Mobility    Modified  Rankin (Stroke Patients Only)       Balance Overall balance assessment: Needs assistance;History of Falls Sitting-balance support: Feet supported Sitting balance-Leahy Scale: Fair     Standing balance support: Bilateral upper extremity supported Standing balance-Leahy Scale: Poor Standing balance comment: With myoclonus in UEs, LEs not present but fearful and needing to sit on initial sit to stand. required RW for support.                            Cognition Arousal/Alertness: Awake/alert Behavior During Therapy: WFL for tasks assessed/performed Overall Cognitive Status: Within Functional Limits for tasks assessed Area of Impairment: Following commands;Orientation;Problem solving                 Orientation Level: Disoriented to;Time;Situation     Following Commands: Follows one step commands with increased time     Problem Solving: Slow processing;Requires verbal cues;Requires tactile cues General Comments: AxO x 2 overall feels "bad".  Poor PO intake "not interested" and "it's not what I want".      Exercises  10 reps B LE AP, knee presses, HS, ABD, SLR (AAROM)     General Comments General comments (skin integrity, edema, etc.): husband present      Pertinent Vitals/Pain Pain Assessment: Faces Faces Pain Scale: Hurts little more Pain Location: RUE, back Pain Descriptors / Indicators: Discomfort;Grimacing Pain Intervention(s): Monitored during session;Repositioned  Home Living Family/patient expects to be discharged to:: Private residence Living Arrangements: Spouse/significant other Available Help at Discharge: Family;Available 24 hours/day Type of Home: House Home Access: Stairs to enter Entrance Stairs-Rails: Right;Left;Can reach both Home Layout: One level;Other (Comment) Home Equipment: Walker - 2 wheels;Bedside commode;Wheelchair - manual Additional Comments: Lives at home with husband    Prior Function Level of Independence: Needs  assistance  Gait / Transfers Assistance Needed: Pt states she uses RW or cane for home and community distances ADL's / Homemaking Assistance Needed: Pt states she is able to dress and bathe self, but husband does cooking and cleaning. Husband report inconsistent-he assists with dressing and bathing as well.     PT Goals (current goals can now be found in the care plan section) Acute Rehab PT Goals Patient Stated Goal: go home Progress towards PT goals: Progressing toward goals    Frequency    Min 3X/week      PT Plan Current plan remains appropriate    Co-evaluation              AM-PAC PT "6 Clicks" Mobility   Outcome Measure  Help needed turning from your back to your side while in a flat bed without using bedrails?: A Lot Help needed moving from lying on your back to sitting on the side of a flat bed without using bedrails?: A Lot Help needed moving to and from a bed to a chair (including a wheelchair)?: A Lot Help needed standing up from a chair using your arms (e.g., wheelchair or bedside chair)?: A Lot Help needed to walk in hospital room?: Total Help needed climbing 3-5 steps with a railing? : Total 6 Click Score: 10    End of Session   Activity Tolerance: Patient limited by fatigue;No increased pain Patient left: in bed;with call bell/phone within reach;with bed alarm set;with family/visitor present;Other (comment) Nurse Communication: Mobility status PT Visit Diagnosis: Other abnormalities of gait and mobility (R26.89);Muscle weakness (generalized) (M62.81)     Time: 1055-1110 PT Time Calculation (min) (ACUTE ONLY): 15 min  Charges:  $Therapeutic Exercise: 8-22 mins                     Rica Koyanagi  PTA Acute  Rehabilitation Services Pager      [phone] Office      [phone]

## 2021-10-11 ENCOUNTER — Inpatient Hospital Stay: Payer: Self-pay

## 2021-10-11 DIAGNOSIS — R112 Nausea with vomiting, unspecified: Secondary | ICD-10-CM | POA: Diagnosis not present

## 2021-10-11 DIAGNOSIS — E860 Dehydration: Secondary | ICD-10-CM | POA: Diagnosis not present

## 2021-10-11 DIAGNOSIS — D529 Folate deficiency anemia, unspecified: Secondary | ICD-10-CM | POA: Diagnosis not present

## 2021-10-11 DIAGNOSIS — N179 Acute kidney failure, unspecified: Secondary | ICD-10-CM | POA: Diagnosis not present

## 2021-10-11 LAB — BASIC METABOLIC PANEL
Anion gap: 8 (ref 5–15)
BUN: 7 mg/dL — ABNORMAL LOW (ref 8–23)
CO2: 26 mmol/L (ref 22–32)
Calcium: 7.4 mg/dL — ABNORMAL LOW (ref 8.9–10.3)
Chloride: 101 mmol/L (ref 98–111)
Creatinine, Ser: 0.71 mg/dL (ref 0.44–1.00)
GFR, Estimated: >60 mL/min (ref >60)
Glucose, Bld: 104 mg/dL — ABNORMAL HIGH (ref 70–99)
Potassium: 3.3 mmol/L — ABNORMAL LOW (ref 3.5–5.1)
Sodium: 135 mmol/L (ref 135–145)

## 2021-10-11 LAB — CBC
HCT: 31.4 % — ABNORMAL LOW (ref 36.0–46.0)
Hemoglobin: 10.8 g/dL — ABNORMAL LOW (ref 12.0–15.0)
MCH: 29.6 pg (ref 26.0–34.0)
MCHC: 34.4 g/dL (ref 30.0–36.0)
MCV: 86 fL (ref 80.0–100.0)
Platelets: 156 10*3/uL (ref 150–400)
RBC: 3.65 MIL/uL — ABNORMAL LOW (ref 3.87–5.11)
RDW: 15.1 % (ref 11.5–15.5)
WBC: 6.4 10*3/uL (ref 4.0–10.5)
nRBC: 0 % (ref 0.0–0.2)

## 2021-10-11 LAB — MAGNESIUM: Magnesium: 1.6 mg/dL — ABNORMAL LOW (ref 1.7–2.4)

## 2021-10-11 LAB — URINE CULTURE: Culture: 400 — AB

## 2021-10-11 LAB — PHOSPHORUS: Phosphorus: 1.6 mg/dL — ABNORMAL LOW (ref 2.5–4.6)

## 2021-10-11 MED ORDER — POTASSIUM CHLORIDE 10 MEQ/100ML IV SOLN
10.0000 meq | INTRAVENOUS | Status: AC
Start: 2021-10-11 — End: 2021-10-11
  Administered 2021-10-11 (×4): 10 meq via INTRAVENOUS
  Filled 2021-10-11 (×4): qty 100

## 2021-10-11 MED ORDER — MAGNESIUM SULFATE 4 GM/100ML IV SOLN
4.0000 g | Freq: Once | INTRAVENOUS | Status: AC
  Administered 2021-10-11: 4 g via INTRAVENOUS
  Filled 2021-10-11: qty 100

## 2021-10-11 MED ORDER — POTASSIUM PHOSPHATES 15 MMOLE/5ML IV SOLN
30.0000 mmol | Freq: Once | INTRAVENOUS | Status: AC
  Administered 2021-10-11: 30 mmol via INTRAVENOUS
  Filled 2021-10-11: qty 10

## 2021-10-11 NOTE — Progress Notes (Signed)
PROGRESS NOTE    Nicole Ruiz  YDX:[id number] DOB: [birth year] DOA: 10/01/2021 PCP: Bernerd Limbo, MD    No chief complaint on file.   Brief Narrative:  Nicole Ruiz is a 80 y.o. female with medical history significant of hypertension, hyperlipidemia, CKD stage II, depression who presents with intractable nausea vomiting and poor p.o. intake with dehydration over the past 3 to 4 days.  Patient denies any sick contacts recent travel or change in dietary habits.  Apparently some 4 days prior to admission she was having some mild nausea symptoms with poor p.o. intake which continued to worsen over the past 4 days now with intractable symptoms and abnormal labs as evaluated in the ED with elevated creatinine from baseline and an anion gap greater than 20. Hospitalist called to admit. Patient also with complaints of dysphagia, multiple attempts of barium swallow done unsuccessful, GI consulted and patient for probable upper endoscopy tomorrow 10/09/2021.  Patient also noted to have right upper extremity with concerns for cystic fluid collection.  MRI upper extremity pending.   Assessment & Plan:   Principal Problem:   Nausea and vomiting Active Problems:   Esophageal reflux   Clinical depression   Hypothyroid   AKI (acute kidney injury) ([place])   UTI (urinary tract infection)   Oropharyngeal dysphagia   Failure to thrive (child)   Anemia   Hematuria   Dehydration   Acute gastric ulcer without hemorrhage or perforation   Esophageal ring   Intramuscular hematoma  1 intractable nausea vomiting, unlikely infectious process, POA/dysphagia/odynophagia, ongoing -Questionable etiology. -Patient noted to have UTI on presentation and received 3 days of IV Rocephin. -Patient and husband poor historians, patient's intractable nausea vomiting concerning for possible obstructive process and is noted per husband that patient has had difficulty swallowing for over 4 weeks with poor oral  intake. -Multiple attempts at barium swallow unsuccessful, see speech evaluation note as below without any overt spillage or aspiration patient able to only take minimal oral contrast. -CT neck unremarkable for any acute findings of odynophagia/dysphagia. -CT chest with new subacute appearing fracture of the manubrium body with developing callus formation, mild superior endplate compression fractures T1 and T2 new from 03/01/2021 but also likely subacute, mild superior endplate compression fracture T11 again seen without progressive height loss from prior CT [address] 22.  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  Mild prominence of the right and left main pulmonary arteries which can be seen with pulmonary arterial hypertension.  Aortic and coronary artery atherosclerosis. -Patient seen in consultation by GI and patient started on PPI twice daily and underwent EGD 10/09/2021 with incidental large caliber distal esophageal ring, gastric ulcers otherwise clear endoscopy, no findings on endoscopy to explain current swallow issues. -Continue IV PPI.   -Patient currently not interested in PEG tube and discussions with patient.  -Palliative care consultation pending. -Trial of full liquid diet and advance as tolerated to a soft diet. -GI was following but have signed off as of 10/09/2021.  2.  Hematuria -Questionable etiology.  May be secondary to Pseudomonas UTI per urology. -Repeat UA with cultures and sensitivities consistent with a Pseudomonas UTI. -Status post 3 days IV Rocephin. -CT renal stone protocol with interval development of 2 x 1.6 x 2.6 cm hypodensity within the right kidney may represent blood product/renal cast.   -Foley catheter was placed with irrigation with improvement  with hematuria.  -Continue IV cefepime to treat Pseudomonas  UTI.  -Urology consulted and following. -Follow.  3.  Iron deficiency anemia /folate deficiency/acute blood loss anemia -Could likely be secondary to hematuria, muscular hematoma noted in wrap upper extremity and possible GI bleed.  Patient with no overt GI bleed. -Patient seen in consultation by GI and patient status post upper endoscopy 10/09/2021 with incidental large caliber distal esophageal ring, gastric ulcers otherwise clear endoscopy, no findings on endoscopy to explain current swallow issues. -Continue PPI twice daily. -Anemia panel with iron deficiency anemia and folate deficiency with folate of 2.5, iron of 24, TIBC of 125. -Hemoglobin stable at 10.8 posttransfusion of 2 units packed red blood cells. -Continue folic acid 1 mg daily. -Transfusion threshold hemoglobin < 7.  4.  Acute metabolic encephalopathy, POA resolved/baseline dementia -Patient's mental status noted to be close to baseline. -Baseline dementia suspected, unclear diagnosis. -UDS was positive for barbiturates, patient declines being on anything in this drug class, current home med list unclear whether accurate does not contain barbiturates, unclear how she is positive for this medication list false positive in the setting of high NSAID use.  5.  Right upper extremity hematoma/phlebitis -Ultrasound right upper extremity done concerning for complex cystic lesions along striated surrounding tissues may represent muscles in the region of concern, possibilities may include hematoma, abscess or centrally necrotic mass. -See #13.  6.  Mild acute kidney injury on chronic kidney disease stage II, resolved -Renal function back to baseline. -Monitor with hydration.  7.  UTI,>>>> Pseudomonas UTI -Urine cultures not obtained on admission. -Patient is status post 3 days IV Rocephin. -Repeat UA with cultures and sensitivities consistent with a UTI with 60,000 colonies of Pseudomonas aeruginosa and 80,000 colonies of yeast,  Pseudomonas sensitive to third-generation cephalosporins, ciprofloxacin, gentamicin, imipenem, Zosyn, cefepime.  -Continue IV cefepime due to difficulty with oral intake.  -IV Diflucan x7 days.  -Could likely remove Foley catheter in approximately 5 days from insertion or closer to discharge. -Urology following due to hematuria.  8.  Anion gap metabolic acidosis of unclear etiology, resolving -Unclear etiology. -BUN in the 30s on admission. -Salicylate, acetaminophen, ethanol levels unremarkable. -UDS positive for barbiturates only. -Urine had mild ketosis. -On bicarb drip which we will continue for another 24 hours. -Improved with hydration.  9.  Hypomagnesemia/hypokalemia -Magnesium at 1.6, phosphorus at 1.6.   -Magnesium sulfate 4 g IV x1.   -K-Phos 30 mmol IV x1.   -Repeat labs in the AM.    10.  Hypothyroidism -Patient with some difficulty tolerating oral intake and as such Synthroid has been changed to IV Synthroid.  11.??  Dementia -Continue home regimen Namenda, Aricept if able to tolerate oral medications.  12.  Depression -Continue Wellbutrin able to tolerate oral medications.  13.  Intramuscular hematoma of right upper extremity -Patient noted with right upper extremity swelling and redness and painful. -Ultrasound done concerning for complex cystic lesions along striated surrounding tissues which may represent muscles in the region of concerns.  Possibilities included hematoma, abscess or centrally necrotic mass. -MRI of the humerus done with large intramuscular heterogeneous collection within the brachioradialis muscle measuring 4 x 2.8 cm in axial dimension and 13 x 7 cm in craniocaudal extent with characteristics most compatible with hematoma.  Similar finding noted in the deltoid muscle favored to be hematoma. -Clinical improvement. -Continue warm compresses, elevation of right upper extremity.   -Midline removed 10/09/2021. -Will need follow-up ultrasound in 6 to  12 weeks or MRI  in 3 months to ensure collections resolving.  14.  Goals of care -Patient with history of dementia, presenting with nausea vomiting difficulty swallowing and per husband poor oral intake over the past 4 weeks. -Patient with poor oral intake and poor nutrition at this time. -Patient seen by GI underwent upper endoscopy with no endoscopic findings to explain swallow issues. -Patient still with difficulty with oral intake. -Patient at this time does not seem interested in PEG tube. -Palliative care consultation for goals of care.    DVT prophylaxis: SCDs Code Status: Full Family Communication: Updated patient and husband at bedside. Disposition:   Status is: Inpatient  Remains inpatient appropriate because:Inpatient level of care appropriate due to severity of illness  Dispo: The patient is from: Home              Anticipated d/c is to:  TBD              Patient currently is not medically stable to d/c.   Difficult to place patient No       Consultants:  Gastroenterology: Dr. Henrene Pastor 10/06/2021 Palliative care pending  Procedures:  CT renal stone protocol 10/08/2021 CT chest without contrast 10/07/2021 CT soft tissue neck 10/06/2021 CT head 10/01/2021 Chest x-ray 10/01/2021 MBS 10/05/2021 MRI humerus 10/08/2021 Ultrasound right upper extremity soft tissue 10/07/2021 Multiple attempts at barium swallow unsuccessful EGD  10/09/2021 Transfusion 2 units packed red blood cells  Antimicrobials:  IV Rocephin 10/02/2021>>>>> 10/04/2021 IV cefepime 10/09/2021>>>>>    Subjective: Patient laying in bed.  States does not feel well feels like she sitting on teeth.  No chest pain.  No shortness of breath.  No abdominal pain.  Still with some difficulty swallowing.  Husband at bedside.  Foley catheter with clear urine  Objective: Vitals:   10/10/21 2118 10/10/21 2118 10/11/21 0014 10/11/21 0510  BP: (!) 169/91 (!) 169/91 (!) 145/89 (!) 145/85  Pulse: 78 78  75   Resp: 16 16  16   Temp: 98.4 F (36.9 C) 98.4 F (36.9 C)  98 F (36.7 C)  TempSrc: Oral Oral  Oral  SpO2: 95% 95%  94%  Weight:      Height:        Intake/Output Summary (Last 24 hours) at 10/11/2021 1012 Last data filed at 10/11/2021 7782 Gross per 24 hour  Intake 790 ml  Output 1775 ml  Net -985 ml    Filed Weights   10/01/21 1231 10/01/21 2215  Weight: 66.4 kg 57.7 kg    Examination:  General exam: NAD.  Dry mucous membranes. Respiratory system: Clear to auscultation bilaterally anterior lung fields.  No wheezes, no crackles, no rhonchi.  Normal respiratory effort.   Cardiovascular system: Regular rate rhythm no murmurs rubs or gallops.  No JVD.  No lower extremity edema.  Gastrointestinal system: Abdomen is soft, nontender, nondistended, positive bowel sounds.  No rebound.  No guarding.  Central nervous system: Alert and oriented.  Moving EXTR spontaneously.  No focal neurological deficits.  Extremities: Right upper extremity with decreasing swelling, still erythematous, less tender to palpation.  Skin: No rashes, lesions or ulcers Psychiatry: Judgement and insight appear poor to fair. Mood & affect appropriate.     Data Reviewed: I have personally reviewed following labs and imaging studies  CBC: Recent Labs  Lab 10/07/21 0323 10/08/21 0427 10/08/21 1553 10/09/21 0442 10/10/21 0408 10/11/21 0446  WBC 9.1 8.1  --  7.1 8.7 6.4  NEUTROABS  --   --   --  5.3 6.9  --   HGB 8.9* 7.3* 6.0* 11.2* 10.9* 10.8*  HCT 25.4* 21.1* 17.5* 32.2* 31.8* 31.4*  MCV 87.0 87.9  --  85.2 86.2 86.0  PLT 148* 136*  --  124* 122* 156     Basic Metabolic Panel: Recent Labs  Lab 10/07/21 0323 10/08/21 0427 10/08/21 0848 10/09/21 0442 10/10/21 0408 10/11/21 0446  NA 134* 135  --  139 134* 135  K 3.7 3.4*  --  3.0* 3.2* 3.3*  CL 103 104  --  104 102 101  CO2 17* 18*  --  23 18* 26  GLUCOSE 99 106*  --  95 85 104*  BUN 14 13  --  9 8 7*  CREATININE 0.74 0.71  --  0.74  0.66 0.71  CALCIUM 8.0* 8.1*  --  8.0* 7.3* 7.4*  MG  --   --  1.4* 1.8 1.9 1.6*  PHOS  --   --   --   --   --  1.6*     GFR: Estimated Creatinine Clearance: 46.4 mL/min (by C-G formula based on SCr of 0.71 mg/dL).  Liver Function Tests: Recent Labs  Lab 10/05/21 0331 10/06/21 0342 10/07/21 0323 10/09/21 0442  AST 23 22 25  51*  ALT 18 19 18 25   ALKPHOS 58 53 43 49  BILITOT 1.3* 1.2 1.4* 1.1  PROT 6.1* 5.6* 4.9* 5.0*  ALBUMIN 3.0* 2.8* 2.6* 2.4*     CBG: No results for input(s): GLUCAP in the last 168 hours.   Recent Results (from the past 240 hour(s))  Resp Panel by RT-PCR (Flu A&B, Covid) Nasopharyngeal Swab     Status: None   Collection Time: 10/01/21  1:43 PM   Specimen: Nasopharyngeal Swab; Nasopharyngeal(NP) swabs in vial transport medium  Result Value Ref Range Status   SARS Coronavirus 2 by RT PCR NEGATIVE NEGATIVE Final    Comment: (NOTE) SARS-CoV-2 target nucleic acids are NOT DETECTED.  The SARS-CoV-2 RNA is generally detectable in upper respiratory specimens during the acute phase of infection. The lowest concentration of SARS-CoV-2 viral copies this assay can detect is 138 copies/mL. A negative result does not preclude SARS-Cov-2 infection and should not be used as the sole basis for treatment or other patient management decisions. A negative result may occur with  improper specimen collection/handling, submission of specimen other than nasopharyngeal swab, presence of viral mutation(s) within the areas targeted by this assay, and inadequate number of viral copies(<138 copies/mL). A negative result must be combined with clinical observations, patient history, and epidemiological information. The expected result is Negative.  Fact Sheet for Patients:  EntrepreneurPulse.com.au  Fact Sheet for Healthcare Providers:  IncredibleEmployment.be  This test is no t yet approved or cleared by the [place] FDA and  has  been authorized for detection and/or diagnosis of SARS-CoV-2 by FDA under an Emergency Use Authorization (EUA). This EUA will remain  in effect (meaning this test can be used) for the duration of the COVID-19 declaration under Section 564(b)(1) of the Act, 21 U.S.C.section 360bbb-3(b)(1), unless the authorization is terminated  or revoked sooner.       Influenza A by PCR NEGATIVE NEGATIVE Final   Influenza B by PCR NEGATIVE NEGATIVE Final    Comment: (NOTE) The Xpert Xpress SARS-CoV-2/FLU/RSV plus assay is intended as an aid in the diagnosis of influenza from Nasopharyngeal swab specimens and should not be used as a sole basis for treatment. Nasal washings and aspirates are unacceptable for Xpert Xpress SARS-CoV-2/FLU/RSV testing.  Fact Sheet for Patients: EntrepreneurPulse.com.au  Fact Sheet for Healthcare Providers: IncredibleEmployment.be  This test is not yet approved or cleared by the [place] FDA and has been authorized for detection and/or diagnosis of SARS-CoV-2 by FDA under an Emergency Use Authorization (EUA). This EUA will remain in effect (meaning this test can be used) for the duration of the COVID-19 declaration under Section 564(b)(1) of the Act, 21 U.S.C. section 360bbb-3(b)(1), unless the authorization is terminated or revoked.  Performed at [facility], [place] [address] [place], [place] [address]   Urine Culture     Status: Abnormal   Collection Time: 10/08/21 10:19 AM   Specimen: In/Out Cath Urine  Result Value Ref Range Status   Specimen Description   Final    IN/OUT CATH URINE Performed at [place] [address] [place], [place] [address]    Special Requests   Final    NONE Performed at [facility], [place] [address] [place], [place] [address]    Culture (A)  Final    60,000 COLONIES/mL PSEUDOMONAS AERUGINOSA 80,000 COLONIES/mL  YEAST Standardized susceptibility testing for this organism is not available. Performed at [facility], [place] [address], [place], [place] [address]    Report Status 10/10/2021 FINAL  Final   Organism ID, Bacteria PSEUDOMONAS AERUGINOSA (A)  Final      Susceptibility   Pseudomonas aeruginosa - MIC*    CEFTAZIDIME 4 SENSITIVE Sensitive     CIPROFLOXACIN <=0.25 SENSITIVE Sensitive     GENTAMICIN <=1 SENSITIVE Sensitive     IMIPENEM 2 SENSITIVE Sensitive     PIP/TAZO 8 SENSITIVE Sensitive     CEFEPIME 2 SENSITIVE Sensitive     * 60,000 COLONIES/mL PSEUDOMONAS AERUGINOSA  Urine Culture     Status: Abnormal (Preliminary result)   Collection Time: 10/08/21  8:08 PM   Specimen: Urine, Catheterized  Result Value Ref Range Status   Specimen Description   Final    URINE, CATHETERIZED Performed at [facility], [place] [address] [place], [place] [address]    Special Requests   Final    NONE Performed at [facility], [place] [address], [place], [place] [address]    Culture (A)  Final    400 COLONIES/mL PSEUDOMONAS AERUGINOSA SUSCEPTIBILITIES TO FOLLOW Performed at [facility], [place] [address], [place],  [address]    Report Status PENDING  Incomplete          Radiology Studies: No results found.      Scheduled Meds:  buPROPion  150 mg Oral Daily   Chlorhexidine Gluconate Cloth  6 each Topical Daily   diphenhydrAMINE  12.5 mg Intravenous Once   donepezil  10 mg Oral QHS   feeding supplement  237 mL Oral TID BM   FLUoxetine  40 mg Oral Daily   folic acid  1 mg Intravenous Daily   levothyroxine  25 mcg Intravenous Daily   memantine  10 mg Oral BID   multivitamin with minerals  1 tablet Oral Daily   pantoprazole (PROTONIX) IV  40 mg Intravenous Q12H   primidone  100 mg Oral Daily   And   primidone  50 mg Oral QHS   protein supplement  1 Scoop Oral TID WC   sodium chloride flush  10-40 mL Intracatheter Q12H    Continuous Infusions:  sodium chloride 10 mL/hr at 10/03/21 1041   ceFEPime (MAXIPIME) IV 2 g (10/11/21 1006)   fluconazole (  DIFLUCAN) IV 200 mg (10/10/21 1159)   magnesium sulfate bolus IVPB 4 g (10/11/21 0949)   potassium chloride     potassium PHOSPHATE IVPB (in mmol)     sodium bicarbonate 150 mEq in D5W infusion 75 mL/hr at 10/10/21 1828     LOS: 9 days    Time spent: 35 minutes    Irine Seal, MD [facility]   To contact the attending provider between 7A-7P or the covering provider during after hours 7P-7A, please log into the web site [email or web address] and access using universal [place] password for that web site. If you do not have the password, please call the hospital operator.  10/11/2021, 10:12 AM

## 2021-10-12 DIAGNOSIS — K253 Acute gastric ulcer without hemorrhage or perforation: Secondary | ICD-10-CM | POA: Diagnosis not present

## 2021-10-12 DIAGNOSIS — R627 Adult failure to thrive: Secondary | ICD-10-CM | POA: Diagnosis not present

## 2021-10-12 DIAGNOSIS — E860 Dehydration: Secondary | ICD-10-CM | POA: Diagnosis not present

## 2021-10-12 DIAGNOSIS — R6251 Failure to thrive (child): Secondary | ICD-10-CM

## 2021-10-12 DIAGNOSIS — Z515 Encounter for palliative care: Secondary | ICD-10-CM

## 2021-10-12 DIAGNOSIS — N179 Acute kidney failure, unspecified: Secondary | ICD-10-CM | POA: Diagnosis not present

## 2021-10-12 DIAGNOSIS — R112 Nausea with vomiting, unspecified: Secondary | ICD-10-CM | POA: Diagnosis not present

## 2021-10-12 DIAGNOSIS — D529 Folate deficiency anemia, unspecified: Secondary | ICD-10-CM | POA: Diagnosis not present

## 2021-10-12 DIAGNOSIS — Z7189 Other specified counseling: Secondary | ICD-10-CM

## 2021-10-12 LAB — CBC
HCT: 32.6 % — ABNORMAL LOW (ref 36.0–46.0)
Hemoglobin: 11.2 g/dL — ABNORMAL LOW (ref 12.0–15.0)
MCH: 29.5 pg (ref 26.0–34.0)
MCHC: 34.4 g/dL (ref 30.0–36.0)
MCV: 85.8 fL (ref 80.0–100.0)
Platelets: 182 10*3/uL (ref 150–400)
RBC: 3.8 MIL/uL — ABNORMAL LOW (ref 3.87–5.11)
RDW: 15.1 % (ref 11.5–15.5)
WBC: 4.9 10*3/uL (ref 4.0–10.5)
nRBC: 0 % (ref 0.0–0.2)

## 2021-10-12 LAB — RENAL FUNCTION PANEL
Albumin: 2.2 g/dL — ABNORMAL LOW (ref 3.5–5.0)
Anion gap: 6 (ref 5–15)
BUN: 8 mg/dL (ref 8–23)
CO2: 31 mmol/L (ref 22–32)
Calcium: 7.3 mg/dL — ABNORMAL LOW (ref 8.9–10.3)
Chloride: 98 mmol/L (ref 98–111)
Creatinine, Ser: 0.65 mg/dL (ref 0.44–1.00)
GFR, Estimated: >60 mL/min (ref >60)
Glucose, Bld: 115 mg/dL — ABNORMAL HIGH (ref 70–99)
Phosphorus: 3.5 mg/dL (ref 2.5–4.6)
Potassium: 4.1 mmol/L (ref 3.5–5.1)
Sodium: 135 mmol/L (ref 135–145)

## 2021-10-12 LAB — SURGICAL PATHOLOGY

## 2021-10-12 LAB — MAGNESIUM: Magnesium: 2.4 mg/dL (ref 1.7–2.4)

## 2021-10-12 MED ORDER — SODIUM CHLORIDE 0.9% FLUSH
10.0000 mL | Freq: Two times a day (BID) | INTRAVENOUS | Status: DC
  Administered 2021-10-13 – 2021-10-15 (×3): 10 mL

## 2021-10-12 MED ORDER — SODIUM CHLORIDE 0.9% FLUSH
10.0000 mL | INTRAVENOUS | Status: DC | PRN
  Administered 2021-10-13: 10 mL

## 2021-10-12 NOTE — Progress Notes (Signed)
Bleeding noted under dressing.  Pressure applied to insertion site causing blood to spread under dressing.   Nevin Bloodgood RN at bedside, notified to hold pressure if starts again; dressing to be changed in 48 hours.

## 2021-10-12 NOTE — Consult Note (Signed)
Consultation Note Date: 10/12/2021   Patient Name: Nicole Ruiz  DOB: 07/27/1941  MRN: [id number]  Age / Sex: 80 y.o., female  PCP: Bernerd Limbo, MD Referring Physician: Eugenie Filler, MD  Reason for Consultation: Establishing goals of care  HPI/Patient Profile: 80 y.o. female   admitted on 10/01/2021    Clinical Assessment and Goals of Care: 80 year old lady who lives at home with her husband in [place], [place] with past medical history of hypertension dyslipidemia stage II chronic kidney disease depression.  Patient presented with nausea vomiting and poor oral intake.  For 4-5 weeks prior to this hospitalization the patient has had difficulty swallowing and has gradually become weaker and less ambulatory. Patient remains admitted to hospital medicine service.  She was noted to have a urinary tract infection and was given antibiotic.  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 Patient seen and evaluated by gastroenterology, started on PPI twice daily, also underwent EGD found to have a distal esophageal ring, gastric ulcers but otherwise clear endoscopy.  Speech-language pathologist following, trial of full liquid diet with hopes to advance to a soft diet or in effect, remains on antibiotics and palliative consultation for goals of care discussion specifically with regards to artificial nutrition and hydration have been requested. Patient is awake alert resting in bed.  Husband is at bedside.  I introduced myself and palliative care as  follows: Palliative medicine is specialized medical care for people living with serious illness. It focuses on providing relief from the symptoms and stress of a serious illness. The goal is to improve quality of life for both the patient and the family. Goals of care: Broad aims of medical therapy in relation to the patient's values and preferences. Our aim is to provide medical care aimed at enabling patients to achieve the goals that matter most to them, given the circumstances of their particular medical situation and their constraints.  We reviewed scope of current hospitalization.  We discussed about patient's signs and symptoms prior to this hospitalization.  Extensive discussions regarding patient's current oral intake and pros and cons of artificial nutrition and hydration discussed.  NEXT OF KIN Husband who is present at bedside.  SUMMARY OF RECOMMENDATIONS   Full code for now Continue current mode of care Extensive discussion with the patient and her husband was at bedside today about her current condition and her overall health.  Discussion specifically about comfort feedings versus artificial nutrition and hydration.  Patient and her husband are both not in favor of PEG tube, not in favor of artificial nutrition and hydration.  They do wish for continuing current treatments and hope that the patient's dysphagia resolves.  Monitor hospital course and overall disease trajectory of  illness. Recommend skilled nursing facility for rehab with palliative services following towards the end of this hospitalization.  Code Status/Advance Care Planning: Full code   Symptom Management:     Palliative Prophylaxis:  Delirium Protocol  Additional Recommendations (Limitations, Scope, Preferences): No Artificial Feeding  Psycho-social/Spiritual:  Desire for further Chaplaincy support:yes Additional Recommendations: Caregiving  Support/Resources  Prognosis:  Unable to  determine  Discharge Planning: To Be Determined      Primary Diagnoses: Present on Admission:  AKI (acute kidney injury) ([place])  UTI (urinary tract infection)  Nausea and vomiting  Hypothyroid  Esophageal reflux  Clinical depression   I have reviewed the medical record, interviewed the patient and family, and examined the patient. The following aspects are pertinent.  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   Social Determinants of Health   Financial Resource Strain: Not on file  Food Insecurity: Not on file  Transportation Needs: Not on file  Physical Activity: Not on file  Stress: Not on file  Social Connections: Not on file   Family History  Problem Relation Age of Onset   Heart disease Mother 66   Stroke Father    Heart disease Brother 20   Scheduled Meds:  buPROPion  150 mg Oral Daily   Chlorhexidine Gluconate Cloth  6 each Topical Daily   donepezil  10 mg Oral QHS   feeding supplement  237 mL Oral TID BM   FLUoxetine  40 mg Oral Daily   folic acid  1 mg Intravenous Daily   levothyroxine  25 mcg Intravenous Daily   memantine  10 mg Oral BID   multivitamin with minerals  1 tablet Oral Daily   pantoprazole (PROTONIX) IV  40 mg Intravenous Q12H   primidone  100 mg Oral Daily   And   primidone  50 mg Oral QHS   protein supplement  1 Scoop Oral TID WC    sodium chloride flush  10-40 mL Intracatheter Q12H   sodium chloride flush  10-40 mL Intracatheter Q12H   Continuous Infusions:  sodium chloride 10 mL/hr at 10/03/21 1041   ceFEPime (MAXIPIME) IV 2 g (10/12/21 1148)   fluconazole (DIFLUCAN) IV 200 mg (10/11/21 1139)   PRN Meds:.sodium chloride, acetaminophen, lip balm, ondansetron (ZOFRAN) IV, ondansetron, sodium chloride flush, sodium chloride flush Medications Prior to Admission:  Prior to Admission medications   Medication Sig Start Date End Date Taking? Authorizing Provider  ALPRAZolam (XANAX XR) 0.5 MG 24 hr tablet Take 1 tablet (0.5 mg total) by mouth daily. 08/17/18   Frann Rider, NP  apixaban (ELIQUIS) 5 MG TABS tablet TAKE 1 TABLET (5 MG TOTAL) BY MOUTH TWO TIMES DAILY. 03/06/21 03/06/22  Nita Sells, MD  atorvastatin (LIPITOR) 20  MG tablet Take 20 mg by mouth daily. 08/16/18   [provider]  buPROPion (WELLBUTRIN XL) 150 MG 24 hr tablet Take by mouth. 06/23/18   [provider]  cephALEXin (KEFLEX) 500 MG capsule Take 1 capsule (500 mg total) by mouth every 12 (twelve) hours. 03/06/21   Nita Sells, MD  cephALEXin (KEFLEX) 500 MG capsule TAKE 1 CAPSULE (500 MG TOTAL) BY MOUTH EVERY TWELVE HOURS. 03/06/21 03/06/22  Nita Sells, MD  Cholecalciferol (VITAMIN D3) 1000 units CAPS Take by mouth.    [provider]  Cyanocobalamin (VITAMIN B 12 PO) Take 1,000 mg by mouth.    [provider]  donepezil (ARICEPT) 10 MG tablet Take 10 mg by mouth daily. 12/24/20   [provider]  FLUoxetine (PROZAC) 40 MG capsule Take 1 capsule (40 mg total) by mouth daily. 05/17/18 05/17/19  Frann Rider, NP  levothyroxine (SYNTHROID, LEVOTHROID) 50 MCG tablet  07/27/18   [provider]  memantine (NAMENDA) 10 MG tablet Take 10 mg by mouth 2 (two) times daily. 12/11/20   [provider]  Omega-3 Fatty Acids (FISH OIL) 1000 MG CAPS Take by mouth.    [provider]  omeprazole (PRILOSEC OTC) 20 MG tablet Take 20 mg by mouth daily.    [provider]  polyvinyl alcohol (LIQUIFILM TEARS) 1.4 % ophthalmic solution Place 1 drop into both eyes every hour as needed for dry eyes.    [provider]  primidone (MYSOLINE) 50 MG tablet Take 50 mg by mouth 2 (two) times daily. Take 2 tablets in the morning and 1 tablet in the evening.    [provider]  propranolol (INDERAL) 10 MG tablet Take 1 tablet (10 mg total) by mouth 2 (two) times daily. 03/06/21   Nita Sells, MD  UNABLE TO FIND Mega red take one daily    [provider]  Vitamin D, Ergocalciferol, (DRISDOL) 1.25 MG (50000 UNIT) CAPS capsule Take 50,000 Units by mouth every 14 (fourteen) days. 01/07/21   [provider]   No Known Allergies Review of Systems Patient complains of feeling like a lump of food is stuck inside her throat. Physical Exam Elderly lady resting in bed in Patient has right upper extremity swelling and erythema, some tenderness Regular work of breathing S1-S2 Awake alert oriented, answers questions appropriately No focal neurological deficits  Vital Signs: BP (!) 147/54 (BP Location: Left Leg)   Pulse 81   Temp 98.7 F (37.1 C) (Oral)   Resp 16   Ht 5\' 3"  (1.6 m)   Wt 57.7 kg   SpO2 96%   BMI 22.53 kg/m  Pain Scale: 0-10 POSS *See Group Information*: 1-Acceptable,Awake and alert Pain Score: 0-No pain   SpO2: SpO2: 96 % O2 Device:SpO2: 96 % O2 Flow Rate: .O2 Flow Rate (L/min): 10 L/min  IO: Intake/output summary:  Intake/Output Summary (Last 24 hours) at 10/12/2021 1258 Last data filed at 10/12/2021 1000 Gross per 24 hour  Intake 4256.55 ml  Output 3475 ml  Net 781.55 ml    LBM: Last BM Date:  (pt confused and unsure) Baseline Weight: Weight: 66.4 kg Most recent weight: Weight: 57.7 kg     Palliative Assessment/Data:   PPS 50%  Time In:  12 Time Out:  1300 Time Total:  60  Greater than 50%  of  this time was spent counseling and coordinating care related to the above assessment and plan.  Signed by: Loistine Chance, MD   Please contact Palliative  Medicine Team phone at [phone] for questions and concerns.  For individual provider: See Shea Evans

## 2021-10-12 NOTE — Plan of Care (Signed)

## 2021-10-12 NOTE — Progress Notes (Signed)
Peripherally Inserted Central Catheter Placement  The IV Nurse has discussed with the patient and/or persons authorized to consent for the patient, the purpose of this procedure and the potential benefits and risks involved with this procedure.  The benefits include less needle sticks, lab draws from the catheter, and the patient may be discharged home with the catheter. Risks include, but not limited to, infection, bleeding, blood clot (thrombus formation), and puncture of an artery; nerve damage and irregular heartbeat and possibility to perform a PICC exchange if needed/ordered by physician.  Alternatives to this procedure were also discussed.  Bard Power PICC patient education guide, fact sheet on infection prevention and patient information card has been provided to patient /or left at bedside.  husband at bedside gave consent due to pt disorientation.  PICC Placement Documentation  PICC Double Lumen 35/32/99 PICC Left Basilic 38 cm 0 cm (Active)  Indication for Insertion or Continuance of Line Limited venous access - need for IV therapy >5 days (PICC only) 10/12/21 1106  Exposed Catheter (cm) 0 cm 10/12/21 1106  Site Assessment Clean;Dry;Intact 10/12/21 1106  Lumen #1 Status Flushed;Saline locked;Blood return noted 10/12/21 1106  Lumen #2 Status Flushed;Saline locked;Blood return noted 10/12/21 1106  Dressing Type Transparent 10/12/21 1106  Dressing Status Clean;Intact;Dry 10/12/21 1106  Antimicrobial disc in place? Yes 10/12/21 1106  Safety Lock Not Applicable 24/26/83 4196  Line Care Connections checked and tightened 10/12/21 1106  Line Adjustment (NICU/IV Team Only) No 10/12/21 1106  Dressing Intervention New dressing 10/12/21 1106  Dressing Change Due 10/19/21 10/12/21 1106       Rolena Infante 10/12/2021, 11:08 AM

## 2021-10-12 NOTE — Progress Notes (Signed)
PROGRESS NOTE    Nicole Ruiz  UXL:[id number] DOB: [birth year] DOA: 10/01/2021 PCP: Bernerd Limbo, MD    No chief complaint on file.   Brief Narrative:  Nicole Ruiz is a 80 y.o. female with medical history significant of hypertension, hyperlipidemia, CKD stage II, depression who presents with intractable nausea vomiting and poor p.o. intake with dehydration over the past 3 to 4 days.  Patient denies any sick contacts recent travel or change in dietary habits.  Apparently some 4 days prior to admission she was having some mild nausea symptoms with poor p.o. intake which continued to worsen over the past 4 days now with intractable symptoms and abnormal labs as evaluated in the ED with elevated creatinine from baseline and an anion gap greater than 20. Hospitalist called to admit. Patient also with complaints of dysphagia, multiple attempts of barium swallow done unsuccessful, GI consulted and patient for probable upper endoscopy tomorrow 10/09/2021.  Patient also noted to have right upper extremity with concerns for cystic fluid collection.  MRI upper extremity pending.   Assessment & Plan:   Principal Problem:   Nausea and vomiting Active Problems:   Esophageal reflux   Clinical depression   Hypothyroid   AKI (acute kidney injury) ([place])   UTI (urinary tract infection)   Oropharyngeal dysphagia   Failure to thrive (child)   Anemia   Hematuria   Dehydration   Acute gastric ulcer without hemorrhage or perforation   Esophageal ring   Intramuscular hematoma  1 intractable nausea vomiting, unlikely infectious process, POA/dysphagia/odynophagia, ongoing -Questionable etiology. -Patient noted to have UTI on presentation and received 3 days of IV Rocephin. -Patient and husband poor historians, patient's intractable nausea vomiting concerning for possible obstructive process and is noted per husband that patient has had difficulty swallowing for over 4 weeks with poor oral  intake. -Multiple attempts at barium swallow unsuccessful, see speech evaluation note as below without any overt spillage or aspiration patient able to only take minimal oral contrast. -CT neck unremarkable for any acute findings of odynophagia/dysphagia. -CT chest with new subacute appearing fracture of the manubrium body with developing callus formation, mild superior endplate compression fractures T1 and T2 new from 03/01/2021 but also likely subacute, mild superior endplate compression fracture T11 again seen without progressive height loss from prior CT [address] 22.  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  Mild prominence of the right and left main pulmonary arteries which can be seen with pulmonary arterial hypertension.  Aortic and coronary artery atherosclerosis. -Patient seen in consultation by GI and patient started on PPI twice daily and underwent EGD 10/09/2021 with incidental large caliber distal esophageal ring, gastric ulcers otherwise clear endoscopy, no findings on endoscopy to explain current swallow issues. -Continue IV PPI.   -Patient currently not interested in PEG tube and artificial feeding in discussions with patient.  - -Palliative care consulted and patient assessed by Dr. Rowe Pavy and currently patient is a full code for now, continue current mode of care, extensive discussions were had with palliative care and patient and her husband and at this time are not in favor of PEG tube, not in favor of artificial nutrition and hydration however do wish to continue current treatments with the hopes that patient's dysphagia resolves.   -We will need outpatient follow-up with palliative care.   -Trial of soft diet.  -GI was following but  have signed off as of 10/09/2021.  2.  Hematuria -Questionable etiology.  May be secondary to  Pseudomonas UTI per urology. -Repeat UA with cultures and sensitivities consistent with a Pseudomonas UTI. -Status post 3 days IV Rocephin. -CT renal stone protocol with interval development of 2 x 1.6 x 2.6 cm hypodensity within the right kidney may represent blood product/renal cast.   -Foley catheter was placed with irrigation with improvement with hematuria.  -Continue IV cefepime to treat Pseudomonas UTI.  -Urology consulted and following. -We will defer voiding trial to urology. -Hemoglobin stable at 11.2 status post transfusion of packed red blood cells. -Follow.  3.  Iron deficiency anemia /folate deficiency/acute blood loss anemia -Could likely be secondary to hematuria, muscular hematoma noted in wrap upper extremity and possible GI bleed.  Patient with no overt GI bleed. -Patient seen in consultation by GI and patient status post upper endoscopy 10/09/2021 with incidental large caliber distal esophageal ring, gastric ulcers otherwise clear endoscopy, no findings on endoscopy to explain current swallow issues. -Continue PPI twice daily. -Anemia panel with iron deficiency anemia and folate deficiency with folate of 2.5, iron of 24, TIBC of 125. -Hemoglobin stable at 10.8 posttransfusion of 2 units packed red blood cells. -Continue folic acid 1 mg daily. -Hemoglobin currently at 11.2. -Transfusion threshold hemoglobin < 7.  4.  Acute metabolic encephalopathy, POA resolved/baseline dementia -Patient's mental status noted to be close to baseline. -Baseline dementia suspected, unclear diagnosis. -UDS was positive for barbiturates, patient declines being on anything in this drug class, current home med list unclear whether accurate does not contain barbiturates, unclear how she is positive for this medication list false positive in the setting of high NSAID use.  5.  Right upper extremity hematoma/phlebitis -Ultrasound right upper extremity done concerning for complex cystic lesions  along striated surrounding tissues may represent muscles in the region of concern, possibilities may include hematoma, abscess or centrally necrotic mass. -See #13.  6.  Mild acute kidney injury on chronic kidney disease stage II, resolved -Renal function back to baseline. -Saline lock IV fluids.  7.  UTI,>>>> Pseudomonas UTI -Urine cultures not obtained on admission. -Patient is status post 3 days IV Rocephin. -Repeat UA with cultures and sensitivities consistent with a UTI with 60,000 colonies of Pseudomonas aeruginosa and 80,000 colonies of yeast, Pseudomonas sensitive to third-generation cephalosporins, ciprofloxacin, gentamicin, imipenem, Zosyn, cefepime.  -Continue IV cefepime due to difficulty with oral intake.  -IV Diflucan x7 days.  -Could likely remove Foley catheter in approximately 5 days from insertion or closer to discharge and per urology recommendations per. -Urology following due to hematuria.  8.  Anion gap metabolic acidosis of unclear etiology, resolving -Unclear etiology. -BUN in the 30s on admission. -Salicylate, acetaminophen, ethanol levels unremarkable. -UDS positive for barbiturates only. -Urine had mild ketosis. -Was on bicarb drip with bicarb level now at 31.   -Discontinue bicarb drip.   9.  Hypomagnesemia/hypokalemia -Repleted, magnesium at 2.4 potassium of 4.1, phosphorus at 3.5.  -Follow.  10.  Hypothyroidism -Patient with some difficulty tolerating oral intake and as such Synthroid has been changed to IV Synthroid. -Continue IV Synthroid and likely transition to oral Synthroid on discharge.  11.??  Dementia -Continue home regimen Namenda, Aricept if able to tolerate oral medications.  12.  Depression -Wellbutrin if able to tolerate oral medications.   13.  Intramuscular hematoma of right upper extremity -Patient noted with right upper extremity swelling and redness and painful. -Ultrasound done concerning for complex cystic lesions  along  striated surrounding tissues which may represent muscles in the region of concerns.  Possibilities included hematoma, abscess or centrally necrotic mass. -MRI of the humerus done with large intramuscular heterogeneous collection within the brachioradialis muscle measuring 4 x 2.8 cm in axial dimension and 13 x 7 cm in craniocaudal extent with characteristics most compatible with hematoma.  Similar finding noted in the deltoid muscle favored to be hematoma. -Clinical improvement. -Continue warm compresses, elevation of right upper extremity.   -Midline removed 10/09/2021. -Will need follow-up ultrasound in 6 to 12 weeks or MRI in 3 months to ensure collections resolving.  14.  Goals of care -Patient with history of dementia, presenting with nausea vomiting difficulty swallowing and per husband poor oral intake over the past 4 weeks. -Patient with poor oral intake and poor nutrition at this time. -Patient seen by GI underwent upper endoscopy with no endoscopic findings to explain swallow issues. -Patient still with difficulty with oral intake. -Patient at this time does not seem interested in PEG tube. -Palliative care consulted and following.   -See palliative care consultation note.     DVT prophylaxis: SCDs Code Status: Full Family Communication: Updated patient and husband at bedside. Disposition:   Status is: Inpatient  Remains inpatient appropriate because:Inpatient level of care appropriate due to severity of illness  Dispo: The patient is from: Home              Anticipated d/c is to:  TBD              Patient currently is not medically stable to d/c.   Difficult to place patient No       Consultants:  Gastroenterology: Dr. Henrene Pastor 10/06/2021 Palliative care: Dr. Rowe Pavy 10/12/2021  Procedures:  CT renal stone protocol 10/08/2021 CT chest without contrast 10/07/2021 CT soft tissue neck 10/06/2021 CT head 10/01/2021 Chest x-ray 10/01/2021 MBS 10/05/2021 MRI humerus  10/08/2021 Ultrasound right upper extremity soft tissue 10/07/2021 Multiple attempts at barium swallow unsuccessful EGD  10/09/2021 Transfusion 2 units packed red blood cells PICC line placement 10/12/2021  Antimicrobials:  IV Rocephin 10/02/2021>>>>> 10/04/2021 IV cefepime 10/09/2021>>>>>    Subjective: Laying in bed trying to take her oral pain medications in applesauce.  No chest pain.  No shortness of breath.  No abdominal pain.  Foley catheter with clear urine.  PICC line just placed.    Objective: Vitals:   10/11/21 1322 10/11/21 2137 10/12/21 0526 10/12/21 1357  BP: 136/60 (!) 149/67 (!) 147/54 135/60  Pulse: 83 81 81 88  Resp: 18 16 16 17   Temp: 98 F (36.7 C) (!) 97.5 F (36.4 C) 98.7 F (37.1 C) 97.6 F (36.4 C)  TempSrc: Oral Oral Oral Oral  SpO2: 96% 96% 96% 95%  Weight:      Height:        Intake/Output Summary (Last 24 hours) at 10/12/2021 1526 Last data filed at 10/12/2021 1400 Gross per 24 hour  Intake 2038.33 ml  Output 3675 ml  Net -1636.67 ml    Filed Weights   10/01/21 1231 10/01/21 2215  Weight: 66.4 kg 57.7 kg    Examination:  General exam: NAD.  Dry mucous membranes. Respiratory system: CTA B anterior lung fields.  No wheezes, no crackles, no rhonchi.  Fair air movement.  No use of accessory muscles of respiration.  Normal respiratory effort.   Cardiovascular system: RRR no murmurs rubs or gallops.  No JVD.  No lower extremity edema. Gastrointestinal system: Abdomen soft, nontender, nondistended, positive bowel  sounds.  No rebound.  No guarding.   Central nervous system: Alert.  Moving extremities spontaneously.  No focal neurological deficits.   Extremities: Right upper extremity with decreasing swelling, still erythematous, less tender to palpation.  Skin: No rashes, lesions or ulcers Psychiatry: Judgement and insight appear poor to fair. Mood & affect appropriate.     Data Reviewed: I have personally reviewed following labs and  imaging studies  CBC: Recent Labs  Lab 10/08/21 0427 10/08/21 1553 10/09/21 0442 10/10/21 0408 10/11/21 0446 10/12/21 0359  WBC 8.1  --  7.1 8.7 6.4 4.9  NEUTROABS  --   --  5.3 6.9  --   --   HGB 7.3* 6.0* 11.2* 10.9* 10.8* 11.2*  HCT 21.1* 17.5* 32.2* 31.8* 31.4* 32.6*  MCV 87.9  --  85.2 86.2 86.0 85.8  PLT 136*  --  124* 122* 156 182     Basic Metabolic Panel: Recent Labs  Lab 10/08/21 0427 10/08/21 0848 10/09/21 0442 10/10/21 0408 10/11/21 0446 10/12/21 0359  NA 135  --  139 134* 135 135  K 3.4*  --  3.0* 3.2* 3.3* 4.1  CL 104  --  104 102 101 98  CO2 18*  --  23 18* 26 31  GLUCOSE 106*  --  95 85 104* 115*  BUN 13  --  9 8 7* 8  CREATININE 0.71  --  0.74 0.66 0.71 0.65  CALCIUM 8.1*  --  8.0* 7.3* 7.4* 7.3*  MG  --  1.4* 1.8 1.9 1.6* 2.4  PHOS  --   --   --   --  1.6* 3.5     GFR: Estimated Creatinine Clearance: 46.4 mL/min (by C-G formula based on SCr of 0.65 mg/dL).  Liver Function Tests: Recent Labs  Lab 10/06/21 0342 10/07/21 0323 10/09/21 0442 10/12/21 0359  AST 22 25 51*  --   ALT 19 18 25   --   ALKPHOS 53 43 49  --   BILITOT 1.2 1.4* 1.1  --   PROT 5.6* 4.9* 5.0*  --   ALBUMIN 2.8* 2.6* 2.4* 2.2*     CBG: No results for input(s): GLUCAP in the last 168 hours.   Recent Results (from the past 240 hour(s))  Urine Culture     Status: Abnormal   Collection Time: 10/08/21 10:19 AM   Specimen: In/Out Cath Urine  Result Value Ref Range Status   Specimen Description   Final    IN/OUT CATH URINE Performed at [facility] [address] [place], [place] [address]    Special Requests   Final    NONE Performed at [facility], [place] [address] [place], [place] [address]    Culture (A)  Final    60,000 COLONIES/mL PSEUDOMONAS AERUGINOSA 80,000 COLONIES/mL YEAST Standardized susceptibility testing for this organism is not available. Performed at [facility], [place] [address], [place],  [place] [address]    Report Status 10/10/2021 FINAL  Final   Organism ID, Bacteria PSEUDOMONAS AERUGINOSA (A)  Final      Susceptibility   Pseudomonas aeruginosa - MIC*    CEFTAZIDIME 4 SENSITIVE Sensitive     CIPROFLOXACIN <=0.25 SENSITIVE Sensitive     GENTAMICIN <=1 SENSITIVE Sensitive     IMIPENEM 2 SENSITIVE Sensitive     PIP/TAZO 8 SENSITIVE Sensitive     CEFEPIME 2 SENSITIVE Sensitive     * 60,000 COLONIES/mL PSEUDOMONAS AERUGINOSA  Urine Culture     Status:  Abnormal   Collection Time: 10/08/21  8:08 PM   Specimen: Urine, Catheterized  Result Value Ref Range Status   Specimen Description   Final    URINE, CATHETERIZED Performed at [facility] [address], [place], [place] [address]    Special Requests   Final    NONE Performed at [facility], [place] [address] [place], [place] [address]    Culture 400 COLONIES/mL PSEUDOMONAS AERUGINOSA (A)  Final   Report Status 10/11/2021 FINAL  Final   Organism ID, Bacteria PSEUDOMONAS AERUGINOSA (A)  Final      Susceptibility   Pseudomonas aeruginosa - MIC*    CEFTAZIDIME 4 SENSITIVE Sensitive     CIPROFLOXACIN <=0.25 SENSITIVE Sensitive     GENTAMICIN <=1 SENSITIVE Sensitive     IMIPENEM 1 SENSITIVE Sensitive     PIP/TAZO 8 SENSITIVE Sensitive     CEFEPIME 2 SENSITIVE Sensitive     * 400 COLONIES/mL PSEUDOMONAS AERUGINOSA          Radiology Studies: [place] EKG SITE RITE  Result Date: 10/11/2021 If Site Rite image not attached, placement could not be confirmed due to current cardiac rhythm.       Scheduled Meds:  buPROPion  150 mg Oral Daily   Chlorhexidine Gluconate Cloth  6 each Topical Daily   donepezil  10 mg Oral QHS   feeding supplement  237 mL Oral TID BM   FLUoxetine  40 mg Oral Daily   folic acid  1 mg Intravenous Daily   levothyroxine  25 mcg Intravenous Daily   memantine  10 mg Oral BID   multivitamin with minerals  1 tablet Oral Daily   pantoprazole (PROTONIX) IV  40 mg  Intravenous Q12H   primidone  100 mg Oral Daily   And   primidone  50 mg Oral QHS   protein supplement  1 Scoop Oral TID WC   sodium chloride flush  10-40 mL Intracatheter Q12H   sodium chloride flush  10-40 mL Intracatheter Q12H   Continuous Infusions:  sodium chloride 10 mL/hr at 10/03/21 1041   ceFEPime (MAXIPIME) IV 2 g (10/12/21 1148)   fluconazole (DIFLUCAN) IV 200 mg (10/12/21 1258)     LOS: 10 days    Time spent: 35 minutes    Irine Seal, MD [facility]   To contact the attending provider between 7A-7P or the covering provider during after hours 7P-7A, please log into the web site [email or web address] and access using universal [place] password for that web site. If you do not have the password, please call the hospital operator.  10/12/2021, 3:26 PM

## 2021-10-13 DIAGNOSIS — D529 Folate deficiency anemia, unspecified: Secondary | ICD-10-CM | POA: Diagnosis not present

## 2021-10-13 DIAGNOSIS — R112 Nausea with vomiting, unspecified: Secondary | ICD-10-CM | POA: Diagnosis not present

## 2021-10-13 DIAGNOSIS — E860 Dehydration: Secondary | ICD-10-CM | POA: Diagnosis not present

## 2021-10-13 DIAGNOSIS — N179 Acute kidney failure, unspecified: Secondary | ICD-10-CM | POA: Diagnosis not present

## 2021-10-13 LAB — RENAL FUNCTION PANEL
Albumin: 2.1 g/dL — ABNORMAL LOW (ref 3.5–5.0)
Anion gap: 3 — ABNORMAL LOW (ref 5–15)
BUN: 9 mg/dL (ref 8–23)
CO2: 30 mmol/L (ref 22–32)
Calcium: 7.7 mg/dL — ABNORMAL LOW (ref 8.9–10.3)
Chloride: 104 mmol/L (ref 98–111)
Creatinine, Ser: 0.67 mg/dL (ref 0.44–1.00)
GFR, Estimated: >60 mL/min
Glucose, Bld: 93 mg/dL (ref 70–99)
Phosphorus: 3.1 mg/dL (ref 2.5–4.6)
Potassium: 3.9 mmol/L (ref 3.5–5.1)
Sodium: 137 mmol/L (ref 135–145)

## 2021-10-13 LAB — CBC
HCT: 32.7 % — ABNORMAL LOW (ref 36.0–46.0)
Hemoglobin: 10.9 g/dL — ABNORMAL LOW (ref 12.0–15.0)
MCH: 30.1 pg (ref 26.0–34.0)
MCHC: 33.3 g/dL (ref 30.0–36.0)
MCV: 90.3 fL (ref 80.0–100.0)
Platelets: 224 10*3/uL (ref 150–400)
RBC: 3.62 MIL/uL — ABNORMAL LOW (ref 3.87–5.11)
RDW: 15.5 % (ref 11.5–15.5)
WBC: 5 10*3/uL (ref 4.0–10.5)
nRBC: 0 % (ref 0.0–0.2)

## 2021-10-13 LAB — MAGNESIUM: Magnesium: 2 mg/dL (ref 1.7–2.4)

## 2021-10-13 LAB — GLUCOSE, CAPILLARY: Glucose-Capillary: 77 mg/dL (ref 70–99)

## 2021-10-13 NOTE — Care Management Important Message (Signed)
Important Message  Patient Details IM Letter placed in Patients room. Name: Nicole Ruiz MRN: [id number] Date of Birth: [birth year]   Medicare Important Message Given:  Yes     Kerin Salen 10/13/2021, 1:59 PM

## 2021-10-13 NOTE — Progress Notes (Signed)
Notified IV team of dried blood under PICC line dressing, IV RN stated dressing was changed yesterday and per protocol it is changed 48 hours after, dressing will be changed tomorrow.

## 2021-10-13 NOTE — Progress Notes (Signed)
PROGRESS NOTE    Nicole Ruiz  TOI:[id number] DOB: 1940/12/29 DOA: 10/01/2021 PCP: Bernerd Limbo, MD    No chief complaint on file.   Brief Narrative:  Nicole Ruiz is a 80 y.o. female with medical history significant of hypertension, hyperlipidemia, CKD stage II, depression who presents with intractable nausea vomiting and poor p.o. intake with dehydration over the past 3 to 4 days.  Patient denies any sick contacts recent travel or change in dietary habits.  Apparently some 4 days prior to admission she was having some mild nausea symptoms with poor p.o. intake which continued to worsen over the past 4 days now with intractable symptoms and abnormal labs as evaluated in the ED with elevated creatinine from baseline and an anion gap greater than 20. Hospitalist called to admit. Patient also with complaints of dysphagia, multiple attempts of barium swallow done unsuccessful, GI consulted and patient for probable upper endoscopy tomorrow 10/09/2021.  Patient also noted to have right upper extremity with concerns for cystic fluid collection.  MRI upper extremity pending.   Assessment & Plan:   Principal Problem:   Nausea and vomiting Active Problems:   Esophageal reflux   Clinical depression   Hypothyroid   AKI (acute kidney injury) ([place])   UTI (urinary tract infection)   Oropharyngeal dysphagia   Failure to thrive (child)   Anemia   Hematuria   Dehydration   Acute gastric ulcer without hemorrhage or perforation   Esophageal ring   Intramuscular hematoma  1 intractable nausea vomiting, unlikely infectious process, POA/dysphagia/odynophagia, ongoing -Questionable etiology. -Patient noted to have UTI on presentation and received 3 days of IV Rocephin. -Patient and husband poor historians, patient's intractable nausea vomiting concerning for possible obstructive process and is noted per husband that patient has had difficulty swallowing for over 4 weeks with poor oral  intake. -Multiple attempts at barium swallow unsuccessful, see speech evaluation note as below without any overt spillage or aspiration patient able to only take minimal oral contrast. -CT neck unremarkable for any acute findings of odynophagia/dysphagia. -CT chest with new subacute appearing fracture of the manubrium body with developing callus formation, mild superior endplate compression fractures T1 and T2 new from 03/01/2021 but also likely subacute, mild superior endplate compression fracture T11 again seen without progressive height loss from prior CT 83382 22.  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  Mild prominence of the right and left main pulmonary arteries which can be seen with pulmonary arterial hypertension.  Aortic and coronary artery atherosclerosis. -Patient seen in consultation by GI and patient started on PPI twice daily and underwent EGD 10/09/2021 with incidental large caliber distal esophageal ring, gastric ulcers otherwise clear endoscopy, no findings on endoscopy to explain current swallow issues. -Continue IV PPI.   -Patient currently not interested in PEG tube and artificial feeding in discussions with patient.  - -Palliative care consulted and patient assessed by Dr. Rowe Pavy and currently patient is a full code for now, continue current mode of care, extensive discussions were had with palliative care and patient and her husband and at this time are not in favor of PEG tube, not in favor of artificial nutrition and hydration however do wish to continue current treatments with the hopes that patient's dysphagia resolves.   -We will need outpatient follow-up with palliative care.   -Continue trial of soft diet.  -GI was following  but have signed off as of 10/09/2021.  2.  Hematuria -Questionable etiology.  May be  secondary to Pseudomonas UTI per urology. -Repeat UA with cultures and sensitivities consistent with a Pseudomonas UTI. -Status post 3 days IV Rocephin. -CT renal stone protocol with interval development of 2 x 1.6 x 2.6 cm hypodensity within the right kidney may represent blood product/renal cast.   -Foley catheter was placed with irrigation with improvement with hematuria.  -Continue IV cefepime to treat Pseudomonas UTI.  -Urology consulted and following. -We will defer voiding trial to urology. -Hemoglobin stable at 10.9 status post transfusion of packed red blood cells. -May consider voiding trial tomorrow. -Follow.  3.  Iron deficiency anemia /folate deficiency/acute blood loss anemia -Could likely be secondary to hematuria, muscular hematoma noted in wrap upper extremity and possible GI bleed.  Patient with no overt GI bleed. -Patient seen in consultation by GI and patient status post upper endoscopy 10/09/2021 with incidental large caliber distal esophageal ring, gastric ulcers otherwise clear endoscopy, no findings on endoscopy to explain current swallow issues. -PPI twice daily. -Anemia panel with iron deficiency anemia and folate deficiency with folate of 2.5, iron of 24, TIBC of 125. -Hemoglobin stable at 10.8 posttransfusion of 2 units packed red blood cells. -Continue folic acid 1 mg daily. -Hemoglobin currently at 10.9. -Transfusion threshold hemoglobin < 7.  4.  Acute metabolic encephalopathy, POA resolved/baseline dementia -Patient's mental status noted to be close to baseline. -Baseline dementia suspected, unclear diagnosis. -UDS was positive for barbiturates, patient declines being on anything in this drug class, current home med list unclear whether accurate does not contain barbiturates, unclear how she is positive for this medication list false positive in the setting of high NSAID use.  5.  Right upper extremity hematoma/phlebitis -Ultrasound right upper extremity  done concerning for complex cystic lesions along striated surrounding tissues may represent muscles in the region of concern, possibilities may include hematoma, abscess or centrally necrotic mass. -See #13.  6.  Mild acute kidney injury on chronic kidney disease stage II, resolved -Renal function back to baseline. -Saline lock IV fluids.  7.  UTI,>>>> Pseudomonas UTI -Urine cultures not obtained on admission. -Patient is status post 3 days IV Rocephin. -Repeat UA with cultures and sensitivities consistent with a UTI with 60,000 colonies of Pseudomonas aeruginosa and 80,000 colonies of yeast, Pseudomonas sensitive to third-generation cephalosporins, ciprofloxacin, gentamicin, imipenem, Zosyn, cefepime.  -Continue IV cefepime due to difficulty with oral intake.  -Continue IV Diflucan to complete a 7-day.  -Could likely remove Foley catheter in approximately 5 days from insertion or closer to discharge and per urology recommendations per. -Urology following due to hematuria.  8.  Anion gap metabolic acidosis of unclear etiology, resolving -Unclear etiology. -BUN in the 30s on admission. -Salicylate, acetaminophen, ethanol levels unremarkable. -UDS positive for barbiturates only. -Urine had mild ketosis. -Was on bicarb drip with bicarb level at 31.   -Bicarb drip discontinued. -Bicarb currently at 23.  9.  Hypomagnesemia/hypokalemia -Repleted.  Potassium at 3.9.  Magnesium at 2.0.   -Follow.   10.  Hypothyroidism -Patient with some difficulty tolerating oral intake and as such Synthroid has been changed to IV Synthroid. -Continue IV Synthroid and likely transition to oral Synthroid on discharge.  11.??  Dementia -Continue home regimen Namenda, Aricept if able to tolerate oral medications.  12.  Depression -Continue Wellbutrin if able to tolerate oral intake.  13.  Intramuscular hematoma of right upper extremity -Patient noted with right upper extremity swelling  and redness and  painful. -Ultrasound done concerning for complex cystic lesions along striated surrounding tissues which may represent muscles in the region of concerns.  Possibilities included hematoma, abscess or centrally necrotic mass. -MRI of the humerus done with large intramuscular heterogeneous collection within the brachioradialis muscle measuring 4 x 2.8 cm in axial dimension and 13 x 7 cm in craniocaudal extent with characteristics most compatible with hematoma.  Similar finding noted in the deltoid muscle favored to be hematoma. -Clinical improvement. -Continue warm compresses, elevation of right upper extremity.   -Midline removed 10/09/2021. -Will need follow-up ultrasound in 6 to 12 weeks or MRI in 3 months to ensure collections resolving.  14.  Goals of care -Patient with history of dementia, presenting with nausea vomiting difficulty swallowing and per husband poor oral intake over the past 4 weeks. -Patient with poor oral intake and poor nutrition at this time. -Patient seen by GI underwent upper endoscopy with no endoscopic findings to explain swallow issues. -Patient still with difficulty with oral intake. -Patient at this time does not seem interested in PEG tube or artificial feedings.. -Palliative care consulted and following.   -We will need SNF with palliative care following. -See palliative care consultation note.     DVT prophylaxis: SCDs Code Status: Full Family Communication: Updated patient and husband at bedside. Disposition:   Status is: Inpatient  Remains inpatient appropriate because:Inpatient level of care appropriate due to severity of illness  Dispo: The patient is from: Home              Anticipated d/c is to:  TBD              Patient currently is not medically stable to d/c.   Difficult to place patient No       Consultants:  Gastroenterology: Dr. Henrene Pastor 10/06/2021 Palliative care: Dr. Rowe Pavy 10/12/2021  Procedures:  CT renal stone protocol  10/08/2021 CT chest without contrast 10/07/2021 CT soft tissue neck 10/06/2021 CT head 10/01/2021 Chest x-ray 10/01/2021 MBS 10/05/2021 MRI humerus 10/08/2021 Ultrasound right upper extremity soft tissue 10/07/2021 Multiple attempts at barium swallow unsuccessful EGD  10/09/2021 Transfusion 2 units packed red blood cells PICC line placement 10/12/2021  Antimicrobials:  IV Rocephin 10/02/2021>>>>> 10/04/2021 IV cefepime 10/09/2021>>>>>    Subjective: Patient laying in bed.  Patient states wants to go home.  No chest pain.  No abdominal pain.  Foley catheter with clear urine.  Still with poor oral intake.  Husband at bedside.    Objective: Vitals:   10/12/21 0526 10/12/21 1357 10/12/21 2128 10/13/21 0557  BP: (!) 147/54 135/60 (!) 149/63 (!) 151/76  Pulse: 81 88 87 74  Resp: 16 17 16 16   Temp: 98.7 F (37.1 C) 97.6 F (36.4 C) 98.5 F (36.9 C) 97.8 F (36.6 C)  TempSrc: Oral Oral Oral Oral  SpO2: 96% 95% 95% 96%  Weight:      Height:        Intake/Output Summary (Last 24 hours) at 10/13/2021 1037 Last data filed at 10/13/2021 1000 Gross per 24 hour  Intake 624.86 ml  Output 1900 ml  Net -1275.14 ml    Filed Weights   10/01/21 1231 10/01/21 2215  Weight: 66.4 kg 57.7 kg    Examination:  General exam: NAD. Respiratory system: Lungs clear to auscultation bilaterally.  No wheezes, no crackles, no rhonchi.  Fair air movement.  No use accessory muscles of respiration.  Normal respiratory effort.  Cardiovascular system: Regular rate and rhythm no murmurs rubs  or gallops.  No JVD.  No lower extremity edema.  Gastrointestinal system: Abdomen soft, nontender, nondistended, positive bowel sounds.  No rebound.  No guarding.  Central nervous system: Alert.  Moving extremities spontaneously.  No focal neurological deficits.   Extremities: Right upper extremity with decreasing swelling, still erythematous, less tender to palpation.  Skin: No rashes, lesions or  ulcers Psychiatry: Judgement and insight appear poor to fair. Mood & affect appropriate.     Data Reviewed: I have personally reviewed following labs and imaging studies  CBC: Recent Labs  Lab 10/09/21 0442 10/10/21 0408 10/11/21 0446 10/12/21 0359 10/13/21 0136  WBC 7.1 8.7 6.4 4.9 5.0  NEUTROABS 5.3 6.9  --   --   --   HGB 11.2* 10.9* 10.8* 11.2* 10.9*  HCT 32.2* 31.8* 31.4* 32.6* 32.7*  MCV 85.2 86.2 86.0 85.8 90.3  PLT 124* 122* 156 182 224     Basic Metabolic Panel: Recent Labs  Lab 10/09/21 0442 10/10/21 0408 10/11/21 0446 10/12/21 0359 10/13/21 0136  NA 139 134* 135 135 137  K 3.0* 3.2* 3.3* 4.1 3.9  CL 104 102 101 98 104  CO2 23 18* 26 31 30   GLUCOSE 95 85 104* 115* 93  BUN 9 8 7* 8 9  CREATININE 0.74 0.66 0.71 0.65 0.67  CALCIUM 8.0* 7.3* 7.4* 7.3* 7.7*  MG 1.8 1.9 1.6* 2.4 2.0  PHOS  --   --  1.6* 3.5 3.1     GFR: Estimated Creatinine Clearance: 46.4 mL/min (by C-G formula based on SCr of 0.67 mg/dL).  Liver Function Tests: Recent Labs  Lab 10/07/21 0323 10/09/21 0442 10/12/21 0359 10/13/21 0136  AST 25 51*  --   --   ALT 18 25  --   --   ALKPHOS 43 49  --   --   BILITOT 1.4* 1.1  --   --   PROT 4.9* 5.0*  --   --   ALBUMIN 2.6* 2.4* 2.2* 2.1*     CBG: No results for input(s): GLUCAP in the last 168 hours.   Recent Results (from the past 240 hour(s))  Urine Culture     Status: Abnormal   Collection Time: 10/08/21 10:19 AM   Specimen: In/Out Cath Urine  Result Value Ref Range Status   Specimen Description   Final    IN/OUT CATH URINE Performed at [facility] [address], [place], [place] [address]    Special Requests   Final    NONE Performed at [facility], [place] [address] [place], [place] [address]    Culture (A)  Final    60,000 COLONIES/mL PSEUDOMONAS AERUGINOSA 80,000 COLONIES/mL YEAST Standardized susceptibility testing for this organism is not available. Performed at [facility], [place] [address], [place], [place] [address]    Report Status 10/10/2021 FINAL  Final   Organism ID, Bacteria PSEUDOMONAS AERUGINOSA (A)  Final      Susceptibility   Pseudomonas aeruginosa - MIC*    CEFTAZIDIME 4 SENSITIVE Sensitive     CIPROFLOXACIN <=0.25 SENSITIVE Sensitive     GENTAMICIN <=1 SENSITIVE Sensitive     IMIPENEM 2 SENSITIVE Sensitive     PIP/TAZO 8 SENSITIVE Sensitive     CEFEPIME 2 SENSITIVE Sensitive     * 60,000 COLONIES/mL PSEUDOMONAS AERUGINOSA  Urine Culture     Status: Abnormal   Collection Time: 10/08/21  8:08 PM   Specimen: Urine, Catheterized  Result Value Ref Range Status  Specimen Description   Final    URINE, CATHETERIZED Performed at [facility], [place] [address] [place], [place] [address]    Special Requests   Final    NONE Performed at [facility], [place] [address] [place], [place] [address]    Culture 400 COLONIES/mL PSEUDOMONAS AERUGINOSA (A)  Final   Report Status 10/11/2021 FINAL  Final   Organism ID, Bacteria PSEUDOMONAS AERUGINOSA (A)  Final      Susceptibility   Pseudomonas aeruginosa - MIC*    CEFTAZIDIME 4 SENSITIVE Sensitive     CIPROFLOXACIN <=0.25 SENSITIVE Sensitive     GENTAMICIN <=1 SENSITIVE Sensitive     IMIPENEM 1 SENSITIVE Sensitive     PIP/TAZO 8 SENSITIVE Sensitive     CEFEPIME 2 SENSITIVE Sensitive     * 400 COLONIES/mL PSEUDOMONAS AERUGINOSA          Radiology Studies: [place] EKG SITE RITE  Result Date: 10/11/2021 If Site Rite image not attached, placement could not be confirmed due to current cardiac rhythm.       Scheduled Meds:  buPROPion  150 mg Oral Daily   Chlorhexidine Gluconate Cloth  6 each Topical Daily   donepezil  10 mg Oral QHS   feeding supplement  237 mL Oral TID BM   FLUoxetine  40 mg Oral Daily   folic acid  1 mg Intravenous Daily   levothyroxine  25 mcg Intravenous Daily   memantine  10 mg Oral BID   multivitamin with minerals  1 tablet  Oral Daily   pantoprazole (PROTONIX) IV  40 mg Intravenous Q12H   primidone  100 mg Oral Daily   And   primidone  50 mg Oral QHS   protein supplement  1 Scoop Oral TID WC   sodium chloride flush  10-40 mL Intracatheter Q12H   sodium chloride flush  10-40 mL Intracatheter Q12H   Continuous Infusions:  sodium chloride 10 mL/hr at 10/03/21 1041   ceFEPime (MAXIPIME) IV 2 g (10/13/21 0849)   fluconazole (DIFLUCAN) IV 200 mg (10/12/21 1258)     LOS: 11 days    Time spent: 35 minutes    Irine Seal, MD [facility]   To contact the attending provider between 7A-7P or the covering provider during after hours 7P-7A, please log into the web site [email or web address] and access using universal [place] password for that web site. If you do not have the password, please call the hospital operator.  10/13/2021, 10:37 AM

## 2021-10-13 NOTE — Progress Notes (Addendum)
Nutrition Follow-up   INTERVENTION:   -Ensure Plus PO TID, each provides 350 kcals and 13g protein (Ensure Enlive available on unit as well with 20g protein)  -.Beneprotein powder TID with meals, each provides 25 kcals and 6g protein  -Multivitamin with minerals daily  -Placed order for lunch  -Encouraged PO intakes  NUTRITION DIAGNOSIS:   Inadequate oral intake related to nausea as evidenced by per patient/family report.  Ongoing.  GOAL:   Patient will meet greater than or equal to 90% of their needs  Not meeting  MONITOR:   Diet advancement, PO intake, Supplement acceptance, Labs, Weight trends, I & O's  ASSESSMENT:   80 yo female with a PMH of HTN, HLD, CKD stage 2, and depression who presents with intractable nausea vomiting and poor p.o. intake with dehydration over the past 3 to 4 days. Admitted with UTI, acute metabolic encephalopathy, and AKI  Calorie Count for 10/14 and 10/15.  0% intakes documented for every meal.  Pt accepting 2 Ensures on 10/14 and 3 Ensures on 10/15.  Ensures at bedside this morning. One was 1/2 full while the other one was full still.  Pt not consuming 100% of supplements brought into room. Pt alert/oriented x2. Slow to respond sometimes. Reports she hasn't gotten meals sometimes. Per RN, pt has been receiving meals.  Pt agreed to try to eat some broth and peaches today for lunch. Ordered for pt.  Declined an Ensure at this time as well.  Per palliative care note, pt and pt's husband have determined they would not want artificial nutrition per [place].  Will continue to encourage PO intakes.  Not confident pt will meet needs without nutrition support.  Admission weight: 127 lbs.  No other weights this admission.  Medications: IV folic acid, Multivitamin with minerals daily,  Labs reviewed.  Diet Order:   Diet Order             DIET SOFT Room service appropriate? Yes; Fluid consistency: Thin  Diet effective now                    EDUCATION NEEDS:   Education needs have been addressed  Skin:  Skin Assessment: Reviewed RN Assessment  Last BM:  10/8  Height:   Ht Readings from Last 1 Encounters:  10/01/21 5\' 3"  (1.6 m)    Weight:   Wt Readings from Last 1 Encounters:  10/01/21 57.7 kg    BMI:  Body mass index is 22.53 kg/m.  Estimated Nutritional Needs:   Kcal:  5397-6734  Protein:  70-85 grams  Fluid:  >1.8 L   Clayton Bibles, MS, RD, LDN Inpatient Clinical Dietitian Contact information available via Amion

## 2021-10-14 DIAGNOSIS — D529 Folate deficiency anemia, unspecified: Secondary | ICD-10-CM | POA: Diagnosis not present

## 2021-10-14 DIAGNOSIS — R112 Nausea with vomiting, unspecified: Secondary | ICD-10-CM | POA: Diagnosis not present

## 2021-10-14 DIAGNOSIS — N179 Acute kidney failure, unspecified: Secondary | ICD-10-CM | POA: Diagnosis not present

## 2021-10-14 DIAGNOSIS — E860 Dehydration: Secondary | ICD-10-CM | POA: Diagnosis not present

## 2021-10-14 LAB — BASIC METABOLIC PANEL
Anion gap: 7 (ref 5–15)
BUN: 10 mg/dL (ref 8–23)
CO2: 24 mmol/L (ref 22–32)
Calcium: 8.1 mg/dL — ABNORMAL LOW (ref 8.9–10.3)
Chloride: 105 mmol/L (ref 98–111)
Creatinine, Ser: 0.71 mg/dL (ref 0.44–1.00)
GFR, Estimated: >60 mL/min (ref >60)
Glucose, Bld: 81 mg/dL (ref 70–99)
Potassium: 4.1 mmol/L (ref 3.5–5.1)
Sodium: 136 mmol/L (ref 135–145)

## 2021-10-14 LAB — CBC
HCT: 33.7 % — ABNORMAL LOW (ref 36.0–46.0)
Hemoglobin: 11 g/dL — ABNORMAL LOW (ref 12.0–15.0)
MCH: 30.1 pg (ref 26.0–34.0)
MCHC: 32.6 g/dL (ref 30.0–36.0)
MCV: 92.1 fL (ref 80.0–100.0)
Platelets: 227 10*3/uL (ref 150–400)
RBC: 3.66 MIL/uL — ABNORMAL LOW (ref 3.87–5.11)
RDW: 15.6 % — ABNORMAL HIGH (ref 11.5–15.5)
WBC: 5.6 10*3/uL (ref 4.0–10.5)
nRBC: 0 % (ref 0.0–0.2)

## 2021-10-14 LAB — MAGNESIUM: Magnesium: 1.9 mg/dL (ref 1.7–2.4)

## 2021-10-14 LAB — GLUCOSE, CAPILLARY
Glucose-Capillary: 80 mg/dL (ref 70–99)
Glucose-Capillary: 79 mg/dL (ref 70–99)

## 2021-10-14 NOTE — Progress Notes (Signed)
Physical Therapy Treatment Patient Details Name: Nicole Ruiz MRN: [id number] DOB: 27-Dec-1941 Today's Date: 10/14/2021   History of Present Illness 80 yo female presents to [facility] on 10/5 with N/V, poor intake, dehydration. Workup for dysphagia, awaiting upper endoscopy 10/13, UTI now resolved. CT chest shows subacute appearing fracture of manubrium body with callus formation, mild superior endplate compression fractures T1, T2, and T11. CT abdomen shows 4 cm cyst in L kidney, new hpyerdense heterogeneous lesion in R kidney. RUE concern for complex cystic lesions, + edema and rubor. PMH includes HTN, HLD, CKD II, depression, dementia.    PT Comments    General Comments: AxO x 3 pleasant and very willing but very weak and feels "dizzy" when she gets up. General bed mobility comments: required increased time with HOB elevated and use of rail with greatest difficulty self scooting to EON due to weakness.  Used bed pad to complete.  Mod R lean and delayed sitting upright.  Pt also c/o dizziness.  BP was 134/82 HR 72. General transfer comment: + 2 side by side assist due to weakness and also present with posterior LOB/lean.  Assisted from EOB to [facility] also required + 2 asssit with increased assist to complete 1/4 pivot without falling backward. General Gait Details: transfers only due to weakness and c/o dizziness.  Also poor standing balance with limited stance tolerance.  VERY deconditioned. Per Case Manager, Spouse declines SNF Pt will need 24/7 care. PTAR transport home,  and poss a hospital bed (pending need/room).  Pt HAS a walker, 3:1 and a wheelchair already.   Recommendations for follow up therapy are one component of a multi-disciplinary discharge planning process, led by the attending physician.  Recommendations may be updated based on patient status, additional functional criteria and insurance authorization.  Follow Up Recommendations  SNF     Equipment Recommendations        Recommendations for Other Services       Precautions / Restrictions Precautions Precautions: Fall;Back Precaution Comments: PICC, T11 Comp Fx Restrictions Weight Bearing Restrictions: No     Mobility  Bed Mobility Overal bed mobility: Needs Assistance Bed Mobility: Supine to Sit Rolling: Mod assist   Supine to sit: Mod assist     General bed mobility comments: required increased time with HOB elevated and use of rail with greatest difficulty self scooting to EON due to weakness.  Used bed pad to complete.  Mod R lean and delayed sitting upright.  Pt also c/o dizziness.  BP was 134/82 HR 72.    Transfers Overall transfer level: Needs assistance Equipment used: Rolling walker (2 wheeled) Transfers: Sit to/from [facility] Sit to Stand: Mod assist;+2 physical assistance;From elevated surface;+2 safety/equipment Stand pivot transfers: Max assist;+2 physical assistance;+2 safety/equipment       General transfer comment: + 2 side by side assist due to weakness and also present with posterior LOB/lean.  Assisted from EOB to [facility] also required + 2 asssit with increased assist to complete 1/4 pivot without falling backward.  Ambulation/Gait             General Gait Details: transfers only due to weakness and c/o dizziness.  Also poor standing balance with limited stance tolerance.  VERY deconditioned.   Stairs             Wheelchair Mobility    Modified Rankin (Stroke Patients Only)       Balance Overall balance assessment: Needs assistance;History of Falls Sitting-balance support: Feet supported Sitting balance-Leahy Scale:  Fair                                      Cognition Arousal/Alertness: Awake/alert Behavior During Therapy: WFL for tasks assessed/performed Overall Cognitive Status: Within Functional Limits for tasks assessed                         Following Commands: Follows one step commands with  increased time     Problem Solving: Slow processing;Requires verbal cues;Requires tactile cues General Comments: AxO x 3 pleasant and very willing but very weak and feels "dizzy" when she gets up.      Exercises      General Comments        Pertinent Vitals/Pain Pain Assessment: Faces Faces Pain Scale: Hurts little more Pain Location: ABD Pain Descriptors / Indicators: Discomfort;Grimacing Pain Intervention(s): Monitored during session;Premedicated before session    Home Living                      Prior Function            PT Goals (current goals can now be found in the care plan section) Acute Rehab PT Goals Patient Stated Goal: go home Progress towards PT goals: Progressing toward goals    Frequency    Min 3X/week      PT Plan Current plan remains appropriate    Co-evaluation              AM-PAC PT "6 Clicks" Mobility   Outcome Measure  Help needed turning from your back to your side while in a flat bed without using bedrails?: A Lot Help needed moving from lying on your back to sitting on the side of a flat bed without using bedrails?: A Lot Help needed moving to and from a bed to a chair (including a wheelchair)?: A Lot Help needed standing up from a chair using your arms (e.g., wheelchair or bedside chair)?: A Lot Help needed to walk in hospital room?: Total Help needed climbing 3-5 steps with a railing? : Total 6 Click Score: 10    End of Session Equipment Utilized During Treatment: Gait belt Activity Tolerance: Patient limited by fatigue;Other (comment) (dizzy) Patient left: in chair;with call bell/phone within reach;with chair alarm set Nurse Communication: Mobility status (pt voided in [facility]) PT Visit Diagnosis: Other abnormalities of gait and mobility (R26.89);Muscle weakness (generalized) (M62.81)     Time: 6004-5997 PT Time Calculation (min) (ACUTE ONLY): 26 min  Charges:  $Therapeutic Activity: 23-37 mins                      Rica Koyanagi  PTA Acute  Rehabilitation Services Pager      [phone] Office      [phone]

## 2021-10-14 NOTE — Progress Notes (Signed)
IV Nurse to floor and reports day shift staff will perform PICC line dressing change that is scheduled for 0500.

## 2021-10-14 NOTE — TOC Progression Note (Signed)
Transition of Care [facility]) - Progression Note    Patient Details  Name: Nicole Ruiz MRN: [id number] Date of Birth: 1941/06/25  Transition of [facility]) CM/SW Contact  Lennart Pall, LCSW Phone Number: 10/14/2021, 10:40 AM  Clinical Narrative:    Met with pt and spouse this morning to review therapy recommendations for SNF/ rehab.  Pt smiles as spouse states, "I'm not putting her anywhere.  We've done that twice and it didn't work out."  Attempted to explain concerns that pt's care needs may be too great for spouse, however, both state that they want to plan on home dc.  Spouse describes them both at "bullheaded".  Pt and spouse are agreeable with [facility] services.  Pt notes, "we have wheelchairs and walkers and all that stuff at home. We'll be OK."  Doubtful TOC will be able to convince either pt or spouse in changing their minds about dc plan.  Will continue to follow.   Expected Discharge Plan: Home/Self Care Barriers to Discharge: No Barriers Identified  Expected Discharge Plan and Services Expected Discharge Plan: Home/Self Care   Discharge Planning Services: CM Consult   Living arrangements for the past 2 months: Single Family Home                                       Social Determinants of Health (SDOH) Interventions    Readmission Risk Interventions No flowsheet data found.

## 2021-10-14 NOTE — Progress Notes (Signed)
Occupational Therapy Treatment Patient Details Name: Nicole Ruiz MRN: [id number] DOB: 04-04-1941 Today's Date: 10/14/2021   History of present illness 80 yo female presents to [facility] on 10/5 with N/V, poor intake, dehydration. Workup for dysphagia, awaiting upper endoscopy 10/13, UTI now resolved. CT chest shows subacute appearing fracture of manubrium body with callus formation, mild superior endplate compression fractures T1, T2, and T11. CT abdomen shows 4 cm cyst in L kidney, new hpyerdense heterogeneous lesion in R kidney. RUE concern for complex cystic lesions, + edema and rubor. PMH includes HTN, HLD, CKD II, depression, dementia.   OT comments  Patient was agreeable to participate in EOB bathing tasks with encouragement from therapist.patient was able to sit on edge of bed with min guard and participate in UB and partial LB bathing tasks with set up and continued min guard.  Patient was able to participate in bed mobility with mod A with education on proper hand and foot placement to assist with tasks.patient demonstrated self limiting behaviors when discussing standing and/or attempting to get out of bed on this date.  Patient will continue to need 24/7 care and physical assist in next level. It does not appear as though husband will be able to offer current physical assistance need for patient. Patient's discharge plan remains appropriate at this time. OT will continue to follow acutely.     Recommendations for follow up therapy are one component of a multi-disciplinary discharge planning process, led by the attending physician.  Recommendations may be updated based on patient status, additional functional criteria and insurance authorization.    Follow Up Recommendations  SNF    Equipment Recommendations  Other (comment) (defer to next venue)    Recommendations for Other Services      Precautions / Restrictions Precautions Precautions: Fall;Back Precaution Comments: log rolling (T  11 Comp Fx) Restrictions Weight Bearing Restrictions: No       Mobility Bed Mobility Overal bed mobility: Needs Assistance Bed Mobility: Rolling Rolling: Mod assist         General bed mobility comments: with increased time to participate in tasks with proper hand placement    Transfers                 General transfer comment: patient declined to participate in any standing or transfers on this date.    Balance Overall balance assessment: Needs assistance;History of Falls Sitting-balance support: Feet supported Sitting balance-Leahy Scale: Fair                                     ADL either performed or assessed with clinical judgement   ADL Overall ADL's : Needs assistance/impaired     Grooming: Sitting;Set up Grooming Details (indicate cue type and reason): sitting on EOB Upper Body Bathing: Set up;Sitting Upper Body Bathing Details (indicate cue type and reason): sitting on edge of bed Lower Body Bathing: Sitting/lateral leans;Maximal assistance Lower Body Bathing Details (indicate cue type and reason): patient was able to wipe tops of thighs and knees bilaterally sitting on edge of bed. Upper Body Dressing : Set up;Sitting Upper Body Dressing Details (indicate cue type and reason): on edge of bed.                   General ADL Comments: patient was able to sit on edge of bed to participate in bathing tasks. patient demonstrated self limiting behaviors when educated on  getting out of bed into recliner this AM.     Vision Patient Visual Report: No change from baseline     Perception     Praxis      Cognition Arousal/Alertness: Awake/alert Behavior During Therapy: WFL for tasks assessed/performed Overall Cognitive Status: Within Functional Limits for tasks assessed                         Following Commands: Follows one step commands with increased time     Problem Solving: Slow processing;Requires verbal  cues;Requires tactile cues          Exercises     Shoulder Instructions       General Comments      Pertinent Vitals/ Pain       Faces Pain Scale: Hurts little more Pain Location: RUE, back Pain Descriptors / Indicators: Discomfort;Grimacing Pain Intervention(s): Limited activity within patient's tolerance;Repositioned;Monitored during session  Home Living                                          Prior Functioning/Environment              Frequency  Min 2X/week        Progress Toward Goals  OT Goals(current goals can now be found in the care plan section)     Acute Rehab OT Goals Patient Stated Goal: go home  Plan Discharge plan remains appropriate    Co-evaluation                 AM-PAC OT "6 Clicks" Daily Activity     Outcome Measure   Help from another person eating meals?: A Little Help from another person taking care of personal grooming?: A Little Help from another person toileting, which includes using toliet, bedpan, or urinal?: A Lot Help from another person bathing (including washing, rinsing, drying)?: A Lot Help from another person to put on and taking off regular upper body clothing?: A Little Help from another person to put on and taking off regular lower body clothing?: A Lot 6 Click Score: 15    End of Session    OT Visit Diagnosis: Unsteadiness on feet (R26.81);Other abnormalities of gait and mobility (R26.89);History of falling (Z91.81);Muscle weakness (generalized) (M62.81);Pain   Activity Tolerance Patient tolerated treatment well   Patient Left in bed;with call bell/phone within reach;with bed alarm set   Nurse Communication          Time: 1040-1103 OT Time Calculation (min): 23 min  Charges: OT General Charges $OT Visit: 1 Visit OT Treatments $Self Care/Home Management : 23-37 mins  [name] OTR/L, [facility] Acute Rehabilitation Department Office# [phone] Pager# [phone]   [place] 10/14/2021, 11:19 AM

## 2021-10-14 NOTE — Progress Notes (Signed)
PROGRESS NOTE    Nicole Ruiz  OXB:[id number] DOB: July 20, 1941 DOA: 10/01/2021 PCP: Bernerd Limbo, MD    No chief complaint on file.   Brief Narrative:  Nicole Ruiz is a 80 y.o. female with medical history significant of hypertension, hyperlipidemia, CKD stage II, depression who presents with intractable nausea vomiting and poor p.o. intake with dehydration over the past 3 to 4 days.  Patient denies any sick contacts recent travel or change in dietary habits.  Apparently some 4 days prior to admission she was having some mild nausea symptoms with poor p.o. intake which continued to worsen over the past 4 days now with intractable symptoms and abnormal labs as evaluated in the ED with elevated creatinine from baseline and an anion gap greater than 20. Hospitalist called to admit. Patient also with complaints of dysphagia, multiple attempts of barium swallow done unsuccessful, GI consulted and patient for probable upper endoscopy tomorrow 10/09/2021.  Patient also noted to have right upper extremity with concerns for cystic fluid collection.  MRI upper extremity pending.   Assessment & Plan:   Principal Problem:   Nausea and vomiting Active Problems:   Esophageal reflux   Clinical depression   Hypothyroid   AKI (acute kidney injury) ([place])   UTI (urinary tract infection)   Oropharyngeal dysphagia   Failure to thrive (child)   Anemia   Hematuria   Dehydration   Acute gastric ulcer without hemorrhage or perforation   Esophageal ring   Intramuscular hematoma  1 intractable nausea vomiting, unlikely infectious process, POA/dysphagia/odynophagia, ongoing -Questionable etiology. -Patient noted to have UTI on presentation and received 3 days of IV Rocephin. -Patient and husband poor historians, patient's intractable nausea vomiting concerning for possible obstructive process and is noted per husband that patient has had difficulty swallowing for over 4 weeks with poor oral  intake. -Multiple attempts at barium swallow unsuccessful, see speech evaluation note as below without any overt spillage or aspiration patient able to only take minimal oral contrast. -CT neck unremarkable for any acute findings of odynophagia/dysphagia. -CT chest with new subacute appearing fracture of the manubrium body with developing callus formation, mild superior endplate compression fractures T1 and T2 new from 03/01/2021 but also likely subacute, mild superior endplate compression fracture T11 again seen without progressive height loss from prior CT [address] 22.  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  Mild prominence of the right and left main pulmonary arteries which can be seen with pulmonary arterial hypertension.  Aortic and coronary artery atherosclerosis. -Patient seen in consultation by GI and patient started on PPI twice daily and underwent EGD 10/09/2021 with incidental large caliber distal esophageal ring, gastric ulcers otherwise clear endoscopy, no findings on endoscopy to explain current swallow issues. -Continue IV PPI.   -Patient currently not interested in PEG tube and artificial feeding in discussions with patient.  - -Palliative care consulted and patient assessed by Dr. Rowe Pavy and currently patient is a full code for now, continue current mode of care, extensive discussions were had with palliative care and patient and her husband and at this time are not in favor of PEG tube, not in favor of artificial nutrition and hydration however do wish to continue current treatments with the hopes that patient's dysphagia resolves.   -We will need outpatient follow-up with palliative care.   -Continue soft diet.  -GI was following but have  signed off as of 10/09/2021.  2.  Hematuria -Questionable etiology.  May be secondary to  Pseudomonas UTI per urology. -Repeat UA with cultures and sensitivities consistent with a Pseudomonas UTI. -Status post 3 days IV Rocephin. -CT renal stone protocol with interval development of 2 x 1.6 x 2.6 cm hypodensity within the right kidney may represent blood product/renal cast.   -Foley catheter was placed with irrigation with improvement with hematuria.  -Continue IV cefepime to treat Pseudomonas UTI to complete a 7-day course of treatment.  -Urology consulted and following. -Discontinue Foley catheter today, voiding trial.   -Hemoglobin stable at 11.0 status post transfusion of packed red blood cells. -Supportive care.  3.  Iron deficiency anemia /folate deficiency/acute blood loss anemia -Could likely be secondary to hematuria, muscular hematoma noted in wrap upper extremity and possible GI bleed.  Patient with no overt GI bleed. -Patient seen in consultation by GI and patient status post upper endoscopy 10/09/2021 with incidental large caliber distal esophageal ring, gastric ulcers otherwise clear endoscopy, no findings on endoscopy to explain current swallow issues. -Continue PPI twice daily.   -Anemia panel with iron deficiency anemia and folate deficiency with folate of 2.5, iron of 24, TIBC of 125. -Hemoglobin stable at 11, posttransfusion of 2 units packed red blood cells. -Continue folic acid 1 mg daily. -Transfusion threshold hemoglobin < 7.  4.  Acute metabolic encephalopathy, POA resolved/baseline dementia -Patient's mental status noted to be close to baseline. -Baseline dementia suspected, unclear diagnosis. -UDS was positive for barbiturates, patient declines being on anything in this drug class, current home med list unclear whether accurate does not contain barbiturates, unclear how she is positive for this medication list false positive in the setting of high NSAID use.  5.  Right upper extremity hematoma/phlebitis -Ultrasound right upper extremity done concerning  for complex cystic lesions along striated surrounding tissues may represent muscles in the region of concern, possibilities may include hematoma, abscess or centrally necrotic mass. -See #13.  6.  Mild acute kidney injury on chronic kidney disease stage II, resolved -Renal function back to baseline. -Saline lock IV fluids.  7.  UTI,>>>> Pseudomonas UTI -Urine cultures not obtained on admission. -Patient is status post 3 days IV Rocephin. -Repeat UA with cultures and sensitivities consistent with a UTI with 60,000 colonies of Pseudomonas aeruginosa and 80,000 colonies of yeast, Pseudomonas sensitive to third-generation cephalosporins, ciprofloxacin, gentamicin, imipenem, Zosyn, cefepime.  -Continue IV cefepime due to difficulty with oral intake and completed 7-day course of treatment..  -Continue IV Diflucan to complete a 7-day.  -Discontinue Foley, voiding trial today.   -Urology was following for hematuria.    8.  Anion gap metabolic acidosis of unclear etiology, resolving -Unclear etiology. -BUN in the 30s on admission. -Salicylate, acetaminophen, ethanol levels unremarkable. -UDS positive for barbiturates only. -Urine had mild ketosis. -Was on bicarb drip with bicarb level at 24 today..   -Bicarb drip discontinued.  9.  Hypomagnesemia/hypokalemia -Repleted, potassium of 4.1, magnesium at 1.9.   -Follow.   10.  Hypothyroidism -Patient with some difficulty tolerating oral intake and as such Synthroid has been changed to IV Synthroid. -Continue IV Synthroid and likely transition to oral Synthroid on discharge.  11.??  Dementia -Continue home regimen Namenda, Aricept if able to tolerate oral medications.  12.  Depression -Wellbutrin if able to tolerate oral intake.  13.  Intramuscular hematoma of right upper extremity -Patient noted with right upper extremity swelling and redness and painful. -Ultrasound done concerning for complex cystic  lesions along striated surrounding  tissues which may represent muscles in the region of concerns.  Possibilities included hematoma, abscess or centrally necrotic mass. -MRI of the humerus done with large intramuscular heterogeneous collection within the brachioradialis muscle measuring 4 x 2.8 cm in axial dimension and 13 x 7 cm in craniocaudal extent with characteristics most compatible with hematoma.  Similar finding noted in the deltoid muscle favored to be hematoma. -Clinical improvement. -Continue warm compresses, elevation of right upper extremity.   -Midline removed 10/09/2021. -Will need follow-up ultrasound in 6 to 12 weeks or MRI in 3 months to ensure collections resolving.  14.  Goals of care -Patient with history of dementia, presenting with nausea vomiting difficulty swallowing and per husband poor oral intake over the past 4 weeks. -Patient with poor oral intake and poor nutrition at this time. -Patient seen by GI underwent upper endoscopy with no endoscopic findings to explain swallow issues. -Patient still with difficulty with oral intake. -Patient at this time does not seem interested in PEG tube or artificial feedings.. -Palliative care consulted and following.   -We will need SNF with palliative care following. -See palliative care consultation note.  -Patient and husband refusing to go to SNF and as such will be discharged home with home health therapies when medically stable.    DVT prophylaxis: SCDs Code Status: Full Family Communication: Updated patient and husband at bedside. Disposition:   Status is: Inpatient  Remains inpatient appropriate because:Inpatient level of care appropriate due to severity of illness  Dispo: The patient is from: Home              Anticipated d/c is to: SNF recommended however patient has been insistent on going home with home health therapies.              Patient currently is not medically stable to d/c.   Difficult to place patient No       Consultants:   Gastroenterology: Dr. Henrene Pastor 10/06/2021 Palliative care: Dr. Rowe Pavy 10/12/2021  Procedures:  CT renal stone protocol 10/08/2021 CT chest without contrast 10/07/2021 CT soft tissue neck 10/06/2021 CT head 10/01/2021 Chest x-ray 10/01/2021 MBS 10/05/2021 MRI humerus 10/08/2021 Ultrasound right upper extremity soft tissue 10/07/2021 Multiple attempts at barium swallow unsuccessful EGD  10/09/2021 Transfusion 2 units packed red blood cells PICC line placement 10/12/2021  Antimicrobials:  IV Rocephin 10/02/2021>>>>> 10/04/2021 IV cefepime 10/09/2021>>>>>    Subjective: Patient laying in bed.  Some complaints of upper epigastric discomfort.  No shortness of breath.  Per husband appetite slowly improving and today she ate more than she has in 2 weeks.  No abdominal pain.  Patient and husband reluctant to go to SNF and would prefer to go home once stable for discharge.    Objective: Vitals:   10/13/21 0557 10/13/21 1307 10/13/21 2010 10/14/21 0528  BP: (!) 151/76 (!) 197/89 (!) 153/89 (!) 147/86  Pulse: 74 80 88 85  Resp: 16 18 18 18   Temp: 97.8 F (36.6 C) 98.1 F (36.7 C) 98.1 F (36.7 C) 99 F (37.2 C)  TempSrc: Oral Oral Oral Oral  SpO2: 96% 98% 94% 95%  Weight:      Height:        Intake/Output Summary (Last 24 hours) at 10/14/2021 1129 Last data filed at 10/14/2021 1000 Gross per 24 hour  Intake 624.06 ml  Output 3900 ml  Net -3275.94 ml    Filed Weights   10/01/21 1231 10/01/21 2215  Weight: 66.4 kg 57.7 kg  Examination:  General exam: NAD. Respiratory system: CTA B.  No wheezes, no crackles, no rhonchi.  Fair air movement.  No use of accessory muscles of respiration.  Speaking in full sentences.  Cardiovascular system: RRR no murmurs rubs or gallops.  No JVD.  No lower extremity edema. Gastrointestinal system: Abdomen is soft, nontender, nondistended, positive bowel sounds.  No rebound.  No guarding. Central nervous system: Alert.  Moving extremities  spontaneously.  No focal neurological deficits.   Extremities: Right upper extremity with decreasing swelling, still erythematous, less tender to palpation.  Skin: No rashes, lesions or ulcers Psychiatry: Judgement and insight appear poor to fair. Mood & affect appropriate.     Data Reviewed: I have personally reviewed following labs and imaging studies  CBC: Recent Labs  Lab 10/09/21 0442 10/10/21 0408 10/11/21 0446 10/12/21 0359 10/13/21 0136 10/14/21 0351  WBC 7.1 8.7 6.4 4.9 5.0 5.6  NEUTROABS 5.3 6.9  --   --   --   --   HGB 11.2* 10.9* 10.8* 11.2* 10.9* 11.0*  HCT 32.2* 31.8* 31.4* 32.6* 32.7* 33.7*  MCV 85.2 86.2 86.0 85.8 90.3 92.1  PLT 124* 122* 156 182 224 227     Basic Metabolic Panel: Recent Labs  Lab 10/10/21 0408 10/11/21 0446 10/12/21 0359 10/13/21 0136 10/14/21 0351  NA 134* 135 135 137 136  K 3.2* 3.3* 4.1 3.9 4.1  CL 102 101 98 104 105  CO2 18* 26 31 30 24   GLUCOSE 85 104* 115* 93 81  BUN 8 7* 8 9 10   CREATININE 0.66 0.71 0.65 0.67 0.71  CALCIUM 7.3* 7.4* 7.3* 7.7* 8.1*  MG 1.9 1.6* 2.4 2.0 1.9  PHOS  --  1.6* 3.5 3.1  --      GFR: Estimated Creatinine Clearance: 46.4 mL/min (by C-G formula based on SCr of 0.71 mg/dL).  Liver Function Tests: Recent Labs  Lab 10/09/21 0442 10/12/21 0359 10/13/21 0136  AST 51*  --   --   ALT 25  --   --   ALKPHOS 49  --   --   BILITOT 1.1  --   --   PROT 5.0*  --   --   ALBUMIN 2.4* 2.2* 2.1*     CBG: Recent Labs  Lab 10/13/21 2015 10/14/21 0746  GLUCAP 77 80     Recent Results (from the past 240 hour(s))  Urine Culture     Status: Abnormal   Collection Time: 10/08/21 10:19 AM   Specimen: In/Out Cath Urine  Result Value Ref Range Status   Specimen Description   Final    IN/OUT CATH URINE Performed at [facility], [place] [address], [place], [place] [address]    Special Requests   Final    NONE Performed at [facility], [place] [address]  [place], [place] [address]    Culture (A)  Final    60,000 COLONIES/mL PSEUDOMONAS AERUGINOSA 80,000 COLONIES/mL YEAST Standardized susceptibility testing for this organism is not available. Performed at [facility], [place] [address] [place], [place] [address]    Report Status 10/10/2021 FINAL  Final   Organism ID, Bacteria PSEUDOMONAS AERUGINOSA (A)  Final      Susceptibility   Pseudomonas aeruginosa - MIC*    CEFTAZIDIME 4 SENSITIVE Sensitive     CIPROFLOXACIN <=0.25 SENSITIVE Sensitive     GENTAMICIN <=1 SENSITIVE Sensitive     IMIPENEM 2 SENSITIVE Sensitive     PIP/TAZO 8 SENSITIVE Sensitive  CEFEPIME 2 SENSITIVE Sensitive     * 60,000 COLONIES/mL PSEUDOMONAS AERUGINOSA  Urine Culture     Status: Abnormal   Collection Time: 10/08/21  8:08 PM   Specimen: Urine, Catheterized  Result Value Ref Range Status   Specimen Description   Final    URINE, CATHETERIZED Performed at [place] [address] [place], [place] [address]    Special Requests   Final    NONE Performed at [facility], [place] [address] [place], [place] [address]    Culture 400 COLONIES/mL PSEUDOMONAS AERUGINOSA (A)  Final   Report Status 10/11/2021 FINAL  Final   Organism ID, Bacteria PSEUDOMONAS AERUGINOSA (A)  Final      Susceptibility   Pseudomonas aeruginosa - MIC*    CEFTAZIDIME 4 SENSITIVE Sensitive     CIPROFLOXACIN <=0.25 SENSITIVE Sensitive     GENTAMICIN <=1 SENSITIVE Sensitive     IMIPENEM 1 SENSITIVE Sensitive     PIP/TAZO 8 SENSITIVE Sensitive     CEFEPIME 2 SENSITIVE Sensitive     * 400 COLONIES/mL PSEUDOMONAS AERUGINOSA          Radiology Studies: No results found.      Scheduled Meds:  buPROPion  150 mg Oral Daily   Chlorhexidine Gluconate Cloth  6 each Topical Daily   donepezil  10 mg Oral QHS   feeding supplement  237 mL Oral TID BM   FLUoxetine  40 mg Oral Daily   folic acid  1 mg Intravenous Daily   levothyroxine  25 mcg  Intravenous Daily   memantine  10 mg Oral BID   multivitamin with minerals  1 tablet Oral Daily   pantoprazole (PROTONIX) IV  40 mg Intravenous Q12H   primidone  100 mg Oral Daily   And   primidone  50 mg Oral QHS   protein supplement  1 Scoop Oral TID WC   sodium chloride flush  10-40 mL Intracatheter Q12H   sodium chloride flush  10-40 mL Intracatheter Q12H   Continuous Infusions:  sodium chloride 10 mL/hr at 10/03/21 1041   ceFEPime (MAXIPIME) IV 2 g (10/14/21 1024)   fluconazole (DIFLUCAN) IV 200 mg (10/13/21 1111)     LOS: 12 days    Time spent: 35 minutes    Irine Seal, MD [facility]   To contact the attending provider between 7A-7P or the covering provider during after hours 7P-7A, please log into the web site [email or web address] and access using universal [place] password for that web site. If you do not have the password, please call the hospital operator.  10/14/2021, 11:29 AM

## 2021-10-15 DIAGNOSIS — R112 Nausea with vomiting, unspecified: Secondary | ICD-10-CM | POA: Diagnosis not present

## 2021-10-15 DIAGNOSIS — D529 Folate deficiency anemia, unspecified: Secondary | ICD-10-CM | POA: Diagnosis not present

## 2021-10-15 DIAGNOSIS — K253 Acute gastric ulcer without hemorrhage or perforation: Secondary | ICD-10-CM | POA: Diagnosis not present

## 2021-10-15 DIAGNOSIS — N179 Acute kidney failure, unspecified: Secondary | ICD-10-CM | POA: Diagnosis not present

## 2021-10-15 LAB — RENAL FUNCTION PANEL
Albumin: 2.3 g/dL — ABNORMAL LOW (ref 3.5–5.0)
Anion gap: 9 (ref 5–15)
BUN: 11 mg/dL (ref 8–23)
CO2: 21 mmol/L — ABNORMAL LOW (ref 22–32)
Calcium: 8 mg/dL — ABNORMAL LOW (ref 8.9–10.3)
Chloride: 105 mmol/L (ref 98–111)
Creatinine, Ser: 0.82 mg/dL (ref 0.44–1.00)
GFR, Estimated: >60 mL/min (ref >60)
Glucose, Bld: 83 mg/dL (ref 70–99)
Phosphorus: 2.1 mg/dL — ABNORMAL LOW (ref 2.5–4.6)
Potassium: 4 mmol/L (ref 3.5–5.1)
Sodium: 135 mmol/L (ref 135–145)

## 2021-10-15 LAB — CBC
HCT: 34.3 % — ABNORMAL LOW (ref 36.0–46.0)
Hemoglobin: 11 g/dL — ABNORMAL LOW (ref 12.0–15.0)
MCH: 29.6 pg (ref 26.0–34.0)
MCHC: 32.1 g/dL (ref 30.0–36.0)
MCV: 92.2 fL (ref 80.0–100.0)
Platelets: 243 10*3/uL (ref 150–400)
RBC: 3.72 MIL/uL — ABNORMAL LOW (ref 3.87–5.11)
RDW: 15.7 % — ABNORMAL HIGH (ref 11.5–15.5)
WBC: 6.4 10*3/uL (ref 4.0–10.5)
nRBC: 0 % (ref 0.0–0.2)

## 2021-10-15 LAB — MAGNESIUM: Magnesium: 1.7 mg/dL (ref 1.7–2.4)

## 2021-10-15 MED ORDER — LEVOTHYROXINE SODIUM 50 MCG PO TABS
50.0000 ug | ORAL_TABLET | Freq: Every day | ORAL | Status: DC
  Administered 2021-10-16: 50 ug via ORAL
  Filled 2021-10-15: qty 1

## 2021-10-15 MED ORDER — COVID-19MRNA BIVAL VACC PFIZER 30 MCG/0.3ML IM SUSP
0.3000 mL | Freq: Once | INTRAMUSCULAR | Status: AC
  Administered 2021-10-15: 0.3 mL via INTRAMUSCULAR
  Filled 2021-10-15: qty 0.3

## 2021-10-15 MED ORDER — PANTOPRAZOLE SODIUM 40 MG PO TBEC
40.0000 mg | DELAYED_RELEASE_TABLET | Freq: Two times a day (BID) | ORAL | Status: DC
  Administered 2021-10-15: 40 mg via ORAL
  Filled 2021-10-15: qty 1

## 2021-10-15 NOTE — TOC Progression Note (Addendum)
Transition of Care [facility]) - Progression Note    Patient Details  Name: Nicole Ruiz MRN: [id number] Date of Birth: 1941/09/18  [facility]) CM/SW Contact  Lennart Pall, LCSW Phone Number: 10/15/2021, 12:57 PM  Clinical Narrative:    Alerted by MD that pt and spouse now agreeable with SNF recommendation.  Met with them and confirming this as pt states, "I guess I really didn't realize just how weak I am." We reviewed SNF placement process and discussed preferred facilities. Will begin bed search and insurance auth.    ADDENDUM: Have reviewed bed offers with pt/ spouse and they have accepted bed at [facility] who can admit pt tomorrow. MD has ordered for COVID booster (pt agreeable) as well as COVID test.  Have begun insurance authorization.   Expected Discharge Plan: Home/Self Care Barriers to Discharge: No Barriers Identified  Expected Discharge Plan and Services Expected Discharge Plan: Home/Self Care   Discharge Planning Services: CM Consult   Living arrangements for the past 2 months: Single Family Home                                       Social Determinants of Health (SDOH) Interventions    Readmission Risk Interventions No flowsheet data found.

## 2021-10-15 NOTE — NC FL2 (Signed)
[place] MEDICAID FL2 LEVEL OF CARE SCREENING TOOL     IDENTIFICATION  Patient Name: Nicole Ruiz Birthdate: 12-30-1940 Sex: female Admission Date (Current Location): 10/01/2021  [facility] and [place] Number:  Herbalist and Address:  [facility],  [place], [place]      Provider Number: [id number]  Attending Physician Name and Address:  Tawni Millers,*  Relative Name and Phone Number:  spouse, Jury Caserta [phone]    Current Level of Care: Hospital Recommended Level of Care: [place] Prior Approval Number:    Date Approved/Denied:   PASRR Number: [id number]  Discharge Plan: SNF    Current Diagnoses: Patient Active Problem List   Diagnosis Date Noted   Acute gastric ulcer without hemorrhage or perforation    Esophageal ring    Intramuscular hematoma    Anemia 10/08/2021   Nausea and vomiting 10/08/2021   Hematuria 10/08/2021   Dehydration    Oropharyngeal dysphagia    Failure to thrive (child)    UTI (urinary tract infection) 10/02/2021   AKI (acute kidney injury) ([place]) 10/01/2021   Pulmonary embolism, bilateral ([place]) 03/26/2021   Hypothyroid 03/04/2021   Acute respiratory failure ([place]) 03/01/2021   At risk for falling 12/20/2015   Cerebrovascular accident, 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   Femur fracture, right ([place]) 10/20/2015   Fracture of distal femur, right, closed, with routine healing, subsequent encounter 10/20/2015   Fracture of distal end of femur ([place]) 10/20/2015   Fall 10/19/2015   Dyslipidemia 10/19/2015   Nodule of right lung 10/19/2015   Lung mass 10/19/2015   CVA (cerebral infarction) 10/15/2015   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   Hyperlipidemia 10/15/2015   Stroke (cerebrum) ([place]) 10/15/2015   Cerebral infarction ([place]) 10/15/2015   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[place]) 10/15/2015   Cerebrovascular accident (CVA) ([place]) 10/15/2015    Episode of syncope 07/17/2015   Arthralgia of hip 07/09/2015   Idiopathic insomnia 07/09/2015   Arthralgia of lower leg 04/06/2014   Unspecified arthropathy, lower leg 12/18/2013   Acute posthemorrhagic anemia 12/18/2013   Esophageal reflux 12/18/2013   Acid reflux 12/18/2013   Abnormal finding on radiology exam 09/19/2013   Acquired cyst of kidney 09/19/2013   CKD (chronic kidney disease), stage III ([place]) 09/19/2013   Clinical depression 09/19/2013   Bone/cartilage disorder 09/19/2013   Arthritis of knee, degenerative 09/19/2013   Avitaminosis D 09/19/2013   Hypertension 05/29/2012   Closed fracture of metatarsal bone 12/18/2011   HYPERLIPIDEMIA 03/06/2009   DIZZINESS 03/06/2009   Palpitations 03/06/2009   CHEST PAIN 03/06/2009    Orientation RESPIRATION BLADDER Height & Weight     Self, Time, Situation, Place  Normal Indwelling catheter Weight: 127 lb 3.3 oz (57.7 kg) Height:  5\' 3"  (160 cm)  BEHAVIORAL SYMPTOMS/MOOD NEUROLOGICAL BOWEL NUTRITION STATUS      Incontinent, Continent Diet (dys 3; thin)  AMBULATORY STATUS COMMUNICATION OF NEEDS Skin   Extensive Assist Verbally Normal                       Personal Care Assistance Level of Assistance  Bathing, Dressing Bathing Assistance: Limited assistance   Dressing Assistance: Limited assistance     Functional Limitations Info             SPECIAL CARE FACTORS FREQUENCY  PT (By licensed PT), OT (By licensed OT)     PT Frequency: 5x/wk OT Frequency: 5x/wk  Contractures Contractures Info: Not present    Additional Factors Info  Code Status, Allergies, Psychotropic Code Status Info: Full Allergies Info: NKDA Psychotropic Info: see MAR         Current Medications (10/15/2021):  This is the current hospital active medication list Current Facility-Administered Medications  Medication Dose Route Frequency Provider Last Rate Last Admin   0.9 %  sodium chloride infusion   Intravenous PRN  Irene Shipper, MD 10 mL/hr at 10/03/21 1041 Restarted at 10/03/21 1041   acetaminophen (TYLENOL) tablet 650 mg  650 mg Oral Q4H PRN Irene Shipper, MD   650 mg at 10/15/21 0935   buPROPion (WELLBUTRIN XL) 24 hr tablet 150 mg  150 mg Oral Daily Irene Shipper, MD   150 mg at 10/15/21 4081   Chlorhexidine Gluconate Cloth 2 % PADS 6 each  6 each Topical Daily Irene Shipper, MD   6 each at 10/15/21 0937   donepezil (ARICEPT) tablet 10 mg  10 mg Oral QHS Irene Shipper, MD   10 mg at 10/14/21 2205   feeding supplement (ENSURE ENLIVE / ENSURE PLUS) liquid 237 mL  237 mL Oral TID BM Irene Shipper, MD   237 mL at 10/15/21 1255   FLUoxetine (PROZAC) capsule 40 mg  40 mg Oral Daily Irene Shipper, MD   40 mg at 44/81/85 6314   folic acid injection 1 mg  1 mg Intravenous Daily Eugenie Filler, MD   1 mg at 10/15/21 1239   [START ON 10/16/2021] levothyroxine (SYNTHROID) tablet 50 mcg  50 mcg Oral Q0600 Arrien, Jimmy Picket, MD       lip balm (CARMEX) ointment   Topical PRN Irene Shipper, MD   75 application at 97/02/63 0310   memantine (NAMENDA) tablet 10 mg  10 mg Oral BID Irene Shipper, MD   10 mg at 10/15/21 0935   multivitamin with minerals tablet 1 tablet  1 tablet Oral Daily Irene Shipper, MD   1 tablet at 10/15/21 0936   ondansetron (ZOFRAN) injection 4 mg  4 mg Intravenous Q6H PRN Irene Shipper, MD   4 mg at 10/09/21 1132   ondansetron (ZOFRAN) tablet 4 mg  4 mg Oral Q6H PRN Irene Shipper, MD       pantoprazole (PROTONIX) EC tablet 40 mg  40 mg Oral BID Arrien, Jimmy Picket, MD       primidone (MYSOLINE) tablet 100 mg  100 mg Oral Daily Irene Shipper, MD   100 mg at 10/15/21 7858   And   primidone (MYSOLINE) tablet 50 mg  50 mg Oral QHS Irene Shipper, MD   50 mg at 10/14/21 2205   protein supplement (RESOURCE BENEPROTEIN) powder packet 6 g  1 Scoop Oral TID WC Eugenie Filler, MD   6 g at 10/15/21 1241   sodium chloride flush (NS) 0.9 % injection 10-40 mL  10-40 mL Intracatheter Q12H Irene Shipper, MD   10 mL at 10/14/21 1027   sodium chloride flush (NS) 0.9 % injection 10-40 mL  10-40 mL Intracatheter PRN Irene Shipper, MD       sodium chloride flush (NS) 0.9 % injection 10-40 mL  10-40 mL Intracatheter Q12H Eugenie Filler, MD   10 mL at 10/15/21 0949   sodium chloride flush (NS) 0.9 % injection 10-40 mL  10-40 mL Intracatheter PRN Eugenie Filler, MD   10 mL at 10/13/21 2101  Discharge Medications: Please see discharge summary for a list of discharge medications.  Relevant Imaging Results:  Relevant Lab Results:   Additional Information SS# [id number]; COVID vaccine x 2; asking MD to order a booster while here.  Malky Rudzinski, LCSW

## 2021-10-15 NOTE — Progress Notes (Signed)
Occupational Therapy Treatment Patient Details Name: Nicole Ruiz MRN: [id number] DOB: 22-Aug-1941 Today's Date: 10/15/2021   History of present illness 80 yo female presents to [facility] on 10/5 with N/V, poor intake, dehydration. Workup for dysphagia, awaiting upper endoscopy 10/13, UTI now resolved. CT chest shows subacute appearing fracture of manubrium body with callus formation, mild superior endplate compression fractures T1, T2, and T11. CT abdomen shows 4 cm cyst in L kidney, new hpyerdense heterogeneous lesion in R kidney. RUE concern for complex cystic lesions, + edema and rubor. PMH includes HTN, HLD, CKD II, depression, dementia.   OT comments  Focused pt session on caregiver education and increasing pt activity tolerance and balance. Pt performed toilet transfer with Mod A +2 and stedy and performed toilet hygiene and clothing manipulation with Max A +2 for safety. Pt performed perineal and perianal care independently in sitting with lateral leans, but needing assist to donn brief in standing. Caregiver and pt educated on need to practice RW use for functional mobility at home, pt declining saying she was too tired to do that at the time. Pt and caregiver also educated on need for short-term rehab post-d/c. Pt and caregiver verbalized understanding of education, but were still unsure of decision and wary of pt going to rehab facility. Continuing to recommend SNF post d/c, will continue to follow in the acute setting.   Recommendations for follow up therapy are one component of a multi-disciplinary discharge planning process, led by the attending physician.  Recommendations may be updated based on patient status, additional functional criteria and insurance authorization.    Follow Up Recommendations  SNF    Equipment Recommendations  Other (comment) (defer to next venue)    Recommendations for Other Services      Precautions / Restrictions Precautions Precautions:  Fall;Back Precaution Comments: PICC, T11 Comp Fx Restrictions Weight Bearing Restrictions: No       Mobility Bed Mobility                    Transfers Overall transfer level: Needs assistance  Need for lift equipment: Stedy Transfers: Sit to/from Stand Sit to Stand: Mod assist;+2 physical assistance;+2 safety/equipment         General transfer comment: +2 assistance for power up and to remain standing.    Balance Overall balance assessment: Needs assistance;History of Falls Sitting-balance support: Feet supported Sitting balance-Leahy Scale: Fair     Standing balance support: Bilateral upper extremity supported Standing balance-Leahy Scale: Poor Standing balance comment: required BUE support on stedy railing and +2 physical assist                           ADL either performed or assessed with clinical judgement   ADL Overall ADL's : Needs assistance/impaired                         Toilet Transfer: Moderate assistance;+2 for physical assistance;BSC Charlaine Dalton) Toilet Transfer Details (indicate cue type and reason): required assistance for power up and remaining standing. Toileting- Clothing Manipulation and Hygiene: Sitting/lateral lean;Maximal assistance Toileting - Clothing Manipulation Details (indicate cue type and reason): Able to clean self after BM with lateral leans, needing assist to donn incontinence brief.     Functional mobility during ADLs: Moderate assistance;+2 for physical assistance;+2 for safety/equipment (using stedy)       Vision       Perception     Praxis  Cognition Arousal/Alertness: Awake/alert Behavior During Therapy: WFL for tasks assessed/performed Overall Cognitive Status: History of cognitive impairments - at baseline Area of Impairment: Following commands;Problem solving;Safety/judgement                       Following Commands: Follows one step commands with increased  time Safety/Judgement: Decreased awareness of safety   Problem Solving: Slow processing;Requires verbal cues;Requires tactile cues General Comments: Required increased time.        Exercises     Shoulder Instructions       General Comments husband present    Pertinent Vitals/ Pain       Pain Assessment: Faces Faces Pain Scale: No hurt Pain Intervention(s): Monitored during session  Home Living                                          Prior Functioning/Environment              Frequency  Min 2X/week        Progress Toward Goals  OT Goals(current goals can now be found in the care plan section)  Progress towards OT goals: OT to reassess next treatment  Acute Rehab OT Goals Patient Stated Goal: go home OT Goal Formulation: With patient Time For Goal Achievement: 10/24/21 Potential to Achieve Goals: [place] Discharge plan remains appropriate    Co-evaluation                 AM-PAC OT "6 Clicks" Daily Activity     Outcome Measure   Help from another person eating meals?: A Little Help from another person taking care of personal grooming?: A Little Help from another person toileting, which includes using toliet, bedpan, or urinal?: A Lot Help from another person bathing (including washing, rinsing, drying)?: A Lot Help from another person to put on and taking off regular upper body clothing?: A Little Help from another person to put on and taking off regular lower body clothing?: A Lot 6 Click Score: 15    End of Session Equipment Utilized During Treatment: Gait belt  OT Visit Diagnosis: Unsteadiness on feet (R26.81);Other abnormalities of gait and mobility (R26.89);History of falling (Z91.81);Muscle weakness (generalized) (M62.81);Pain   Activity Tolerance Patient tolerated treatment well   Patient Left with call bell/phone within reach;with chair alarm set;in chair   Nurse Communication Mobility status        Time:  4665-9935 OT Time Calculation (min): 29 min  Charges: OT General Charges $OT Visit: 1 Visit OT Treatments $Self Care/Home Management : 23-37 mins  Nicole Ruiz, Nicole Ruiz Acute Rehab Office: [phone]   Nicole Ruiz 10/15/2021, 1:23 PM

## 2021-10-15 NOTE — Progress Notes (Signed)
PROGRESS NOTE    Nicole Ruiz  KVQ:[id number] DOB: 01-30-1941 DOA: 10/01/2021 PCP: Bernerd Limbo, MD    Brief Narrative:  [name] was admitted to the hospital with the working diagnosis of intractable nausea and vomiting,in the setting of urine infection and complicated with metabolic encephalopathy.    80 year old female past medical history for hypertension, dyslipidemia, chronic kidney disease stage II, depression and dementia who presented with intractable nausea, vomiting and poor oral intake for about 3-4 days.  On her initial physical examination blood pressure 173/82, heart rate 76, respiratory rate 13, oxygen saturation 98%, her lungs are clear to auscultation bilaterally, heart S1-S2, present, rhythmic, soft abdomen, no lower extremity edema.   Sodium 144, potassium 3.4, chloride 99, bicarb 22, glucose 118, BUN 32, creatinine 1.44, white count 11.4, hemoglobin 14.6, hematocrit 43.7, platelets 278. SARS COVID-19 negative.   Urinalysis specific gravity 1.019, > 50 white cells. Toxicology screen positive for barbiturates.   Head CT with no acute findings, positive atrophy and chronic small vessel ischemic changes. Chest radiograph with no infiltrates.   Patient had prolonged hospitalization.  She was diagnosed urinary tract infection and was treated with initially ceftriaxone and then cefepime, culture positive for Pseudomonas.   She had swallow evaluation finding dysphagia and risk of aspiration.  Patient underwent upper endoscopy which showed large caliber distal esophageal ring, and gastric ulcers.  She was placed on proton pump inhibitors.   Her mentation slowly improved, patient very weak and deconditioned. Now pending transfer to SNF.      Assessment & Plan:   Principal Problem:   Nausea and vomiting Active Problems:   Esophageal reflux   Clinical depression   Hypothyroid   AKI (acute kidney injury) ([place])   UTI (urinary tract infection)   Oropharyngeal  dysphagia   Failure to thrive (child)   Anemia   Hematuria   Dehydration   Acute gastric ulcer without hemorrhage or perforation   Esophageal ring   Intramuscular hematoma   Urinary tract infection due to pseudomonas, present on admission.  Patient has completed antibiotic therapy with cefepime with good toleration. No leukocytosis and now afebrile. Discontinue fluconazole.  Foley cathter has been removed with no further urinary retention.  2. Acute metabolic encephalopathy. Dementia/ depression. Her mentation has improved, continue to have poor oral intake, very weak and deconditioned. Continue neuro checks and aspiration precautions. Nutritional supplements.  Plan to transfer to SNF.  Continue with bupropion, fluoxetine, mamantine, primidone and donepezil  3. Gastric ulcers, intractable nausea and vomiting. No further nausea or vomiting Continue with pantoprazole.  4. AKI on CKD stage 2, anion gap metabolic acidosis, hypokalemia and hypomagnesemia. Renal function has improved with IV fluids.   5. Chronic iron deficiency anemia, acute blood loss anemia,  and folate deficiency.  Patient had 2 units PRBC transfusion during her hospitalization. Now hgb is stable. Continue folic acid.   6. Hypothyroid. Continue with levothyroxine, change to oral formulation.   7. Right upper extremity hematoma, MRI with humerus hematoma. Deltoid hematoma.  Will need follow up as outpatient.   Status is: Inpatient  Remains inpatient appropriate because: pending transfer to SNF    DVT prophylaxis: Enoxaparin   Code Status:    full  Family Communication:  I spoke with patient's husband at the bedside, we talked in detail about patient's condition, plan of care and prognosis and all questions were addressed.      Nutrition Status: Nutrition Problem: Inadequate oral intake Etiology: nausea Signs/Symptoms: per patient/family report Interventions: Ensure  Enlive (each supplement provides  350kcal and 20 grams of protein), Magic cup, MVI    Subjective: Patient with no nausea or vomiting, poor oral intake, continue to be very weak and deconditioned.   Objective: Vitals:   10/14/21 0528 10/14/21 1413 10/14/21 2158 10/15/21 0613  BP: (!) 147/86 (!) 153/100 (!) 176/86 (!) 173/90  Pulse: 85 80 80 76  Resp: 18 18 16 16   Temp: 99 F (37.2 C) 98.6 F (37 C) 98.2 F (36.8 C) (!) 97.5 F (36.4 C)  TempSrc: Oral Oral Oral Oral  SpO2: 95% 95% 96% 97%  Weight:      Height:        Intake/Output Summary (Last 24 hours) at 10/15/2021 1214 Last data filed at 10/15/2021 1000 Gross per 24 hour  Intake 995.89 ml  Output 775 ml  Net 220.89 ml   Filed Weights   10/01/21 1231 10/01/21 2215  Weight: 66.4 kg 57.7 kg    Examination:   General: Not in pain or dyspnea. Deconditioned  Neurology: Awake and alert, non focal  E ENT: mild pallor, no icterus, oral mucosa moist Cardiovascular: No JVD. S1-S2 present, rhythmic, no gallops, rubs, or murmurs. Trace non pitting bilateral lower extremity edema. Pulmonary: positive breath sounds bilaterally, adequate air movement, no wheezing, rhonchi or rales. Gastrointestinal. Abdomen soft and non tender Skin. No rashes Musculoskeletal: no joint deformities     Data Reviewed: I have personally reviewed following labs and imaging studies  CBC: Recent Labs  Lab 10/09/21 0442 10/10/21 0408 10/11/21 0446 10/12/21 0359 10/13/21 0136 10/14/21 0351 10/15/21 0445  WBC 7.1 8.7 6.4 4.9 5.0 5.6 6.4  NEUTROABS 5.3 6.9  --   --   --   --   --   HGB 11.2* 10.9* 10.8* 11.2* 10.9* 11.0* 11.0*  HCT 32.2* 31.8* 31.4* 32.6* 32.7* 33.7* 34.3*  MCV 85.2 86.2 86.0 85.8 90.3 92.1 92.2  PLT 124* 122* 156 182 224 227 568   Basic Metabolic Panel: Recent Labs  Lab 10/11/21 0446 10/12/21 0359 10/13/21 0136 10/14/21 0351 10/15/21 0445  NA 135 135 137 136 135  K 3.3* 4.1 3.9 4.1 4.0  CL 101 98 104 105 105  CO2 26 31 30 24  21*  GLUCOSE 104*  115* 93 81 83  BUN 7* 8 9 10 11   CREATININE 0.71 0.65 0.67 0.71 0.82  CALCIUM 7.4* 7.3* 7.7* 8.1* 8.0*  MG 1.6* 2.4 2.0 1.9 1.7  PHOS 1.6* 3.5 3.1  --  2.1*   GFR: Estimated Creatinine Clearance: 45.3 mL/min (by C-G formula based on SCr of 0.82 mg/dL). Liver Function Tests: Recent Labs  Lab 10/09/21 0442 10/12/21 0359 10/13/21 0136 10/15/21 0445  AST 51*  --   --   --   ALT 25  --   --   --   ALKPHOS 49  --   --   --   BILITOT 1.1  --   --   --   PROT 5.0*  --   --   --   ALBUMIN 2.4* 2.2* 2.1* 2.3*   No results for input(s): LIPASE, AMYLASE in the last 168 hours. No results for input(s): AMMONIA in the last 168 hours. Coagulation Profile: No results for input(s): INR, PROTIME in the last 168 hours. Cardiac Enzymes: No results for input(s): CKTOTAL, CKMB, CKMBINDEX, TROPONINI in the last 168 hours. BNP (last 3 results) No results for input(s): PROBNP in the last 8760 hours. HbA1C: No results for input(s): HGBA1C in the last 72  hours. CBG: Recent Labs  Lab 10/13/21 2015 10/14/21 0746 10/14/21 1134  GLUCAP 77 80 79   Lipid Profile: No results for input(s): CHOL, HDL, LDLCALC, TRIG, CHOLHDL, LDLDIRECT in the last 72 hours. Thyroid Function Tests: No results for input(s): TSH, T4TOTAL, FREET4, T3FREE, THYROIDAB in the last 72 hours. Anemia Panel: No results for input(s): VITAMINB12, FOLATE, FERRITIN, TIBC, IRON, RETICCTPCT in the last 72 hours.    Radiology Studies: I have reviewed all of the imaging during this hospital visit personally     Scheduled Meds:  buPROPion  150 mg Oral Daily   Chlorhexidine Gluconate Cloth  6 each Topical Daily   donepezil  10 mg Oral QHS   feeding supplement  237 mL Oral TID BM   FLUoxetine  40 mg Oral Daily   folic acid  1 mg Intravenous Daily   levothyroxine  25 mcg Intravenous Daily   memantine  10 mg Oral BID   multivitamin with minerals  1 tablet Oral Daily   pantoprazole (PROTONIX) IV  40 mg Intravenous Q12H    primidone  100 mg Oral Daily   And   primidone  50 mg Oral QHS   protein supplement  1 Scoop Oral TID WC   sodium chloride flush  10-40 mL Intracatheter Q12H   sodium chloride flush  10-40 mL Intracatheter Q12H   Continuous Infusions:  sodium chloride 10 mL/hr at 10/03/21 1041   fluconazole (DIFLUCAN) IV 200 mg (10/14/21 1243)     LOS: 13 days        Larue Lightner Gerome Apley, MD

## 2021-10-16 DIAGNOSIS — D529 Folate deficiency anemia, unspecified: Secondary | ICD-10-CM | POA: Diagnosis not present

## 2021-10-16 DIAGNOSIS — R112 Nausea with vomiting, unspecified: Secondary | ICD-10-CM | POA: Diagnosis not present

## 2021-10-16 DIAGNOSIS — N179 Acute kidney failure, unspecified: Secondary | ICD-10-CM | POA: Diagnosis not present

## 2021-10-16 DIAGNOSIS — K253 Acute gastric ulcer without hemorrhage or perforation: Secondary | ICD-10-CM | POA: Diagnosis not present

## 2021-10-16 LAB — SARS CORONAVIRUS 2 (TAT 6-24 HRS): SARS Coronavirus 2: NEGATIVE

## 2021-10-16 MED ORDER — ALPRAZOLAM ER 0.5 MG PO TB24: 0.5000 mg | ORAL_TABLET | Freq: Every day | ORAL | 3 refills | Status: DC

## 2021-10-16 MED ORDER — ALPRAZOLAM ER 0.5 MG PO TB24: 0.5000 mg | ORAL_TABLET | Freq: Every day | ORAL | 0 refills | Status: DC

## 2021-10-16 MED ORDER — ENSURE ENLIVE PO LIQD: 237.0000 mL | Freq: Three times a day (TID) | ORAL | 0 refills | Status: AC

## 2021-10-16 MED ORDER — PANTOPRAZOLE SODIUM 40 MG PO TBEC
40.0000 mg | DELAYED_RELEASE_TABLET | Freq: Two times a day (BID) | ORAL | 0 refills | Status: DC
Start: 2021-10-16 — End: 2021-11-25

## 2021-10-16 NOTE — Discharge Summary (Addendum)
Physician Discharge Summary  JETAIME PINNIX ZOX:[id number] DOB: 05-Dec-1941 DOA: 10/01/2021  PCP: Bernerd Limbo, MD  Admit date: 10/01/2021 Discharge date: 10/16/2021  Admitted From: Home Disposition:   SNF  Recommendations for Outpatient Follow-up and new medication changes:  Follow up with Dr. Coletta Memos in 7 to 10 days.  Patient completed antibiotic therapy in the hospital.  Pantoprazole 40 mg bid for 15 days, then once daily.  To consider discontinue benzodiazepines.   Home Health: na   Equipment/Devices: na    Discharge Condition: stable  CODE STATUS: full  Diet recommendation:  heart healthy   Brief/Interim Summary: [name] was admitted to the hospital with the working diagnosis of intractable nausea and vomiting,in the setting of urine infection and complicated with metabolic encephalopathy.    80 year old female past medical history for hypertension, dyslipidemia, chronic kidney disease stage II, depression and dementia who presented with intractable nausea, vomiting and poor oral intake for about 3-4 days.  On her initial physical examination blood pressure 173/82, heart rate 76, respiratory rate 13, oxygen saturation 98%, her lungs were clear to auscultation bilaterally, heart S1-S2, present, rhythmic, soft abdomen, no lower extremity edema.   Sodium 144, potassium 3.4, chloride 99, bicarb 22, glucose 118, BUN 32, creatinine 1.44, white count 11.4, hemoglobin 14.6, hematocrit 43.7, platelets 278. SARS COVID-19 negative.   Urinalysis specific gravity 1.019, > 50 white cells. Toxicology screen positive for barbiturates.   Head CT with no acute findings, positive atrophy and chronic small vessel ischemic changes. Chest radiograph with no infiltrates.   Patient had prolonged hospitalization.  She was diagnosed urinary tract infection and was treated with initially ceftriaxone and then cefepime, culture positive for Pseudomonas.   She had swallow evaluation finding dysphagia  and risk of aspiration.  Patient underwent upper endoscopy which showed large caliber distal esophageal ring, and gastric ulcers.  She was placed on proton pump inhibitors twice daily.   Her mentation slowly improved, patient very weak and deconditioned. Now pending transfer to SNF.    Urinary tract infection due to Pseudomonas, present on admission.  No sepsis. Renal CT with 2 x 1.6 x 2.6 cm hyperdensity within the right kidney that may represent blood product, renal cast. Patient was admitted to the medical ward, she received intravenous antibiotic therapy. Her urine culture was positive for Pseudomonas which was sensitive to cephalosporins and fluoroquinolones. Patient received treatment with cefepime, completed course during her hospitalization.  Patient had urinary retention, required indwelling Foley catheter. At discharge her Foley catheter has been removed with no further retention.  2.  Acute metabolic encephalopathy, dementia, depression.  Patient had confusion and mild agitation during her hospitalization. With supportive medical therapy her symptoms improved. She was placed on nutritional supplements. Continue with bupropion, fluoxetine, memantine, primidone and donepezil. At home she uses daily alprazolam, to consider slowly taper this agent off.  Patient was evaluated by physical therapy/Occupational Therapy, recommendations to continue rehab at skilled nursing facility. Swallow dysfunction, continue dysphagia diet and aspiration precautions.  3.  Gastric ulcers, intractable nausea and vomiting.  Patient underwent upper endoscopy finding gastric ulcers. Recommendations to continue proton pump inhibitors with pantoprazole 40 mg twice daily for 15 days, then continue once daily.  4.  Acute kidney injury on chronic kidney disease stage II, anion gap metabolic acidosis, hypokalemia and hypomagnesemia. Patient received intravenous fluids and electrolytes were corrected. At  discharge sodium 135, potassium 4.0, chloride 105, bicarb 21, glucose 83, BUN 11, creatinine 0.82, magnesium 1.7  5.  Chronic iron deficiency  anemia, acute blood loss anemia (upper GI bleed subacute/right upper extremity hematoma), folate deficiency. Patient received PRBC transfusion x2. Her hemoglobin and hematocrit had remained stable. Her discharge hemoglobin is 11.0, hematocrit 34.3.  6.  Hypothyroid.  Continue levothyroxine.  7.  Right upper extremity hematoma.  Patient underwent further work-up with MRI, showing humerus hematoma, deltoid hematoma. Clinically improving.  8. History of bilateral PE. Resume anticoagulation with apixaban, with close monitoring for bleeding.  Old records personally reviewed patient had a submassive acute pulmonary embolism in march 2022.  Risk vs benefit will resume apixban, but needs close follow up for bleeding complications.  I spoke with her husband about this situation.    Discharge Diagnoses:  Principal Problem:   Nausea and vomiting Active Problems:   Esophageal reflux   Clinical depression   Hypothyroid   AKI (acute kidney injury) ([place])   UTI (urinary tract infection)   Oropharyngeal dysphagia   Failure to thrive (child)   Anemia   Hematuria   Dehydration   Acute gastric ulcer without hemorrhage or perforation   Esophageal ring   Intramuscular hematoma    Discharge Instructions   Allergies as of 10/16/2021   No Known Allergies      Medication List     STOP taking these medications    cephALEXin 500 MG capsule Commonly known as: KEFLEX   omeprazole 20 MG tablet Commonly known as: PRILOSEC OTC   VITAMIN B 12 PO       TAKE these medications    ALPRAZolam 0.5 MG 24 hr tablet Commonly known as: Xanax XR Take 1 tablet (0.5 mg total) by mouth daily.   apixaban 5 MG Tabs tablet Commonly known as: ELIQUIS TAKE 1 TABLET (5 MG TOTAL) BY MOUTH TWO TIMES DAILY.   atorvastatin 20 MG tablet Commonly known as:  LIPITOR Take 20 mg by mouth daily.   buPROPion 150 MG 24 hr tablet Commonly known as: WELLBUTRIN XL Take by mouth.   donepezil 10 MG tablet Commonly known as: ARICEPT Take 10 mg by mouth daily.   feeding supplement Liqd Take 237 mLs by mouth 3 (three) times daily between meals.   Fish Oil 1000 MG Caps Take by mouth.   FLUoxetine 40 MG capsule Commonly known as: PROZAC Take 1 capsule (40 mg total) by mouth daily.   levothyroxine 50 MCG tablet Commonly known as: SYNTHROID   memantine 10 MG tablet Commonly known as: NAMENDA Take 10 mg by mouth 2 (two) times daily.   pantoprazole 40 MG tablet Commonly known as: PROTONIX Take 1 tablet (40 mg total) by mouth 2 (two) times daily for 15 days. After 15 days, will change to daily.   polyvinyl alcohol 1.4 % ophthalmic solution Commonly known as: LIQUIFILM TEARS Place 1 drop into both eyes every hour as needed for dry eyes.   primidone 50 MG tablet Commonly known as: MYSOLINE Take 50 mg by mouth 2 (two) times daily. Take 2 tablets in the morning and 1 tablet in the evening.   propranolol 10 MG tablet Commonly known as: INDERAL Take 1 tablet (10 mg total) by mouth 2 (two) times daily.   UNABLE TO FIND Mega red take one daily   Vitamin D (Ergocalciferol) 1.25 MG (50000 UNIT) Caps capsule Commonly known as: DRISDOL Take 50,000 Units by mouth every 14 (fourteen) days.   Vitamin D3 25 MCG (1000 UT) Caps Take by mouth.        Contact information for after-discharge care     Destination  [facility] .   Service: Skilled Nursing Contact information: [address] [phone]                    No Known Allergies  Consultations: GI    Procedures/Studies: CT Abdomen Pelvis Wo Contrast  Result Date: 10/01/2021 CLINICAL DATA:  Abdominal distension with nausea, vomiting and diarrhea for 3 weeks. Decreased appetite. EXAM: CT ABDOMEN AND PELVIS  WITHOUT CONTRAST TECHNIQUE: Multidetector CT imaging of the abdomen and pelvis was performed following the standard protocol without IV contrast. COMPARISON:  Abdominal CT 05/16/2007.  Pelvic CT 10/19/2015. FINDINGS: Lower chest: Mild chronic scarring at both lung bases. No significant pleural or pericardial effusion. Hepatobiliary: The liver appears unremarkable as imaged in the noncontrast state. No evidence of gallstones, gallbladder wall thickening or biliary dilatation. Pancreas: Unremarkable. No pancreatic ductal dilatation or surrounding inflammatory changes. Spleen: Normal in size without focal abnormality. Stable small splenule. Adrenals/Urinary Tract: The adrenal glands appear stable without significant findings. Progressive bilateral renal cortical scarring and thinning. A water density cyst in the interpolar region of the left kidney has enlarged, measuring up to 3.9 cm on image 30/2. No evidence of urinary tract calculus or hydronephrosis. The bladder appears unremarkable. Stomach/Bowel: No enteric contrast administered. The stomach appears unremarkable for its degree of distension. No evidence of bowel wall thickening, distention or surrounding inflammatory change. The appendix is not clearly visualized and may be surgically absent. The colon is decompressed without wall thickening or surrounding inflammation. There is mild intramural fat deposition throughout the colon, especially proximally. There are prominent diverticular changes within the descending and sigmoid colon. Vascular/Lymphatic: There are no enlarged abdominal or pelvic lymph nodes. Aortic and branch vessel atherosclerosis without evidence of aneurysm. Reproductive: Hysterectomy.  No adnexal mass. Other: No evidence of abdominal wall mass or hernia. No ascites. Musculoskeletal: There is a mild biconcave compression deformity at T11 which appears new from a chest CT performed 03/01/2021. No associated osseous retropulsion or significant  paraspinal hemorrhage. Mild lumbar spondylosis. IMPRESSION: 1. Age indeterminate mild T11 compression fracture, new from chest CT 03/01/2021. 2. No acute abdominal findings or explanation for the patient's symptoms. No evidence of bowel obstruction. There is distal colonic diverticulosis without evidence of acute inflammation. 3. Progressive bilateral renal cortical scarring and thinning without hydronephrosis. 4.  Aortic Atherosclerosis (ICD10-I70.0). Electronically Signed   By: Richardean Sale M.D.   On: 10/01/2021 15:09   CT Head Wo Contrast  Result Date: 10/01/2021 CLINICAL DATA:  Nausea with vomiting and diarrhea for 3 weeks. Decreased appetite. Cerebral hemorrhage suspected. EXAM: CT HEAD WITHOUT CONTRAST TECHNIQUE: Contiguous axial images were obtained from the base of the skull through the vertex without intravenous contrast. COMPARISON:  CT head 10/19/2015.  MRI 03/24/2018. FINDINGS: Brain: There is no evidence of acute intracranial hemorrhage, mass lesion, brain edema or extra-axial fluid collection. The ventricles and subarachnoid spaces are appropriately sized for age. 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 No evidence of acute cortical based infarct. Vascular: Intracranial vascular calcifications. No hyperdense vessel identified. Skull: Negative for fracture or focal lesion. Sinuses/Orbits: The visualized paranasal sinuses and mastoid air cells are clear. No orbital abnormalities are seen. Other: Chronic posttraumatic deformity of the right mandibular condyle noted. IMPRESSION: 1. Stable head CT without acute intracranial findings. 2. Atrophy and chronic small vessel ischemic changes. Electronically Signed   By: Richardean Sale M.D.   On:  10/01/2021 17:32   CT SOFT TISSUE NECK W CONTRAST  Result Date: 10/06/2021 CLINICAL DATA:  Cranial neuropathy, poor oral intake, trouble swallowing EXAM: CT NECK WITH  CONTRAST TECHNIQUE: Multidetector CT imaging of the neck was performed using the standard protocol following the bolus administration of intravenous contrast. CONTRAST:  62mL OMNIPAQUE IOHEXOL 350 MG/ML SOLN COMPARISON:  None. FINDINGS: Pharynx and larynx: Normal. No mass or swelling. Salivary glands: No inflammation, mass, or stone. Thyroid: Normal. Lymph nodes: No cervical lymphadenopathy. Prominent supraclavicular lymph nodes, which are not enlarged by CT size criteria and appear unchanged compared to March 2022. Vascular: Calcification at the bilateral carotid bifurcations. Multifocal irregularity in the bilateral subclavian veins, unchanged compared to the 03/01/2021 CTA Limited intracranial: Negative. Visualized orbits: Status post bilateral lens replacements. Mastoids and visualized paranasal sinuses: Clear. Skeleton: No acute or aggressive process. Upper chest: Negative. Other: None. IMPRESSION: No acute process in the neck. Electronically Signed   By: Merilyn Baba M.D.   On: 10/06/2021 16:23   CT CHEST WO CONTRAST  Result Date: 10/07/2021 CLINICAL DATA:  Chest pain, cough, shortness of breath. Calcification or ossification, muscle EXAM: CT CHEST WITHOUT CONTRAST TECHNIQUE: Multidetector CT imaging of the chest was performed following the standard protocol without IV contrast. COMPARISON:  CT 03/01/2021. Chest x-ray 10/01/2021. CT abdomen 10/01/2021 FINDINGS: Cardiovascular: Heart size within normal limits. No pericardial effusion. Thoracic aorta is nonaneurysmal. Scattered atherosclerotic calcifications of the aorta and coronary arteries. Mild prominence of the right and left main pulmonary arteries. Mediastinum/Nodes: No enlarged mediastinal or axillary lymph nodes. Thyroid gland, trachea, and esophagus demonstrate no significant findings. Lungs/Pleura: Scattered foci of centrilobular ground-glass nodularity seen within both lungs may be minimally improved compared to CT 03/01/2021. No new focal  airspace consolidation. Minimal bibasilar scarring or atelectasis. No pleural effusion or pneumothorax. Upper Abdomen: Layering hyperdense fluid within the gallbladder lumen is likely related to vicarious excretion of IV contrast from recent CT scan. No acute findings within the included upper abdomen. Unchanged appearance of the bilateral adrenal glands and visualized kidneys. Musculoskeletal: New subacute appearing fracture of the manubrial body with developing callus formation (series 6, image 84). Mild superior endplate compression fractures of T1 and T2 are new from 03/01/2021. Mild superior endplate compression fracture of T11 is again seen without progressive height loss from previous CT 10/01/2021. No chest wall hematoma or other abnormality. A loop recorder device is noted within the left chest wall. IMPRESSION: 1. New subacute-appearing fracture of the manubrial body with developing callus formation. 2. Mild superior endplate compression fractures of T1 and T2 are new from 03/01/2021, but also likely subacute. 3. Mild superior endplate compression fracture of T11 is again seen without progressive height loss from previous CT 10/01/2021. 4. Scattered foci of centrilobular ground-glass nodularity within both lungs may be minimally improved compared to CT 03/01/2021. Findings may reflect a chronic atypical infectious or inflammatory bronchiolitis. 5. Mild prominence of the right and left main pulmonary arteries, which can be seen with pulmonary arterial hypertension. 6. Aortic and coronary artery atherosclerosis (ICD10-I70.0). Electronically Signed   By: Davina Poke D.O.   On: 10/07/2021 15:15   MR HUMERUS RIGHT W WO CONTRAST  Result Date: 10/09/2021 CLINICAL DATA:  Soft tissue mass, upper arm, US/xray nondiagnostic EXAM: MRI OF THE RIGHT HUMERUS WITHOUT AND WITH CONTRAST TECHNIQUE: Multiplanar, multisequence MR imaging of the right humerus was performed before and after the administration of  intravenous contrast. CONTRAST:  71mL GADAVIST GADOBUTROL 1 MMOL/ML IV SOLN COMPARISON:  Ultrasound 10/07/2021 FINDINGS:  Bones/Joint/Cartilage The cortex is intact. There is no significant marrow signal alteration. Ligaments Grossly intact, suboptimal protocol for complete evaluation. Muscles and Tendons Within the brachioradialis muscle, there is a heterogeneous collection with intrinsic T1 hyperintensity and no enhancement which measures up to 4.0 x 2.8 cm in the axial dimension and 13.7 cm in craniocaudal extent (axial T2 image 31, sagittal STIR image 17). This collection spans from the level of the distal humerus to the proximal radius across the elbow. There is extensive intramuscular edema within the upper arm. Soft tissues Extensive soft tissue edema. There is an additional fluid collection seen along the deltoid muscle proximally which has similar signal characteristics and measures 2.1 x 1.6 x 3.1 cm (axial T2 image 3, coronal stir image 21). IMPRESSION: Large intramuscular heterogeneous collection within the brachioradialis muscle, measuring 4.0 x 2.8 cm in the axial dimension and 13.7 cm in craniocaudal extent. Signal characteristics are most compatible with a hematoma. Additional collection seen along the deltoid muscle proximally which has similar signal characteristics and measures 2.1 x 1.6 x 3.1 cm. This is also favored to be a hematoma. Recommend follow-up ultrasound in 6-12 weeks or MRI in 3 months to ensure these collections are resolving. Electronically Signed   By: Maurine Simmering M.D.   On: 10/09/2021 08:03   DG Chest Port 1 View  Result Date: 10/01/2021 CLINICAL DATA:  Shortness of breath. EXAM: PORTABLE CHEST 1 VIEW COMPARISON:  Radiograph 03/01/2021 and 10/15/2015.  CT 03/01/2021. FINDINGS: 1442 hours. The heart size and mediastinal contours are normal. The lungs are clear. There is no pleural effusion or pneumothorax. No acute osseous findings are identified. Loop recorder noted in the lower  left chest. Telemetry leads overlie the chest. IMPRESSION: Stable chest.  No active cardiopulmonary process. Electronically Signed   By: Richardean Sale M.D.   On: 10/01/2021 15:11   DG Swallowing Func-Speech Pathology  Result Date: 10/05/2021 Table formatting from the original result was not included. 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[place])   Chronic kidney disease   Depression   Hyperlipidemia   Hypertension   Stroke [facility])  Past Surgical History: Past Surgical History: Procedure Laterality Date  ABDOMINAL HYSTERECTOMY    ARM SURGERY    CARDIAC CATHETERIZATION  2013  negative  EP IMPLANTABLE DEVICE N/A 10/17/2015  Procedure: Loop Recorder Insertion;  Surgeon: Will Meredith Leeds, MD;  Location: [place] [facility];  Service: Cardiovascular;  Laterality: N/A;  EYE SURGERY    FEMUR IM NAIL Right 10/20/2015  Procedure: INTRAMEDULLARY (IM) RETROGRADE FEMORAL NAILING;  Surgeon: Tania Ade, MD;  Location: [facility];  Service: Orthopedics;  Laterality: Right;  HEMORROIDECTOMY    KNEE SURGERY    TEE WITHOUT CARDIOVERSION N/A 10/17/2015  Procedure: TRANSESOPHAGEAL ECHOCARDIOGRAM (TEE);  Surgeon: Jerline Pain, MD;  Location: [facility];  Service: Cardiovascular;  Laterality: N/A; HPI: 80 y.o. female with medical history significant of hypertension, hyperlipidemia, CKD stage II, CVA, depression who presents with intractable nausea vomiting and poor p.o. intake with dehydration.  Spouse reports he brought her to the hospital because he could not handle getting her to the bathroom anymore.  Per MD notes "Patient denies any sick contacts recent travel  or  change in dietary habits.   Swallow evaluation ordered due to pt's complaint of dysphagia - nausea/vomiting, globus.  Spouse reports since pt fell backward she has had difficulty swallowing.  He pointed to proximal esophagus to indicate region where pt's pills lodge. States pt consumes only mtn lion and water to take her pills and has not had a "meal" since her fall.  Subjective: patient awake, alert Assessment / Plan / Recommendation CHL IP CLINICAL IMPRESSIONS 10/05/2021 Clinical Impression Patient presents with a mild oropharyngeal dysphagia as per limited amount of barium tested. Patient consumed small to medium sip of thin liquid barium and then immediately started c/o nausea. She did not regurgitate anything and although she did settle down and say she thought she could try another sip, she then started retching again and c/o feeling unsteady while sitting and requesting something to "hold onto". SLP stopped MBS and radiology technician reclined her slightly then helped her back into bed. During the one swallow that she did perform, she exhibited brief holding of thin liquid barium in oral cavity, followed by a swallow initiation delay to level of vallecular sinus. No penetration or aspiration observed, no retrograde movement of bolus observed. UES opening was WNL and no retention of barium anywhere in pharynx, no residuals post swallow. SLP did not observe any signs of stricture and did not observe any impediment to thin liquid barium bolus while transiting in pharynx and through UES or upper portion of esophagus. SLP recommending continue with full liquids at this time. No further ST intervention warranted as patient should be able to advance with solids as tolerated. SLP Visit Diagnosis Dysphagia, oropharyngeal phase (R13.12) Attention and concentration deficit following -- Frontal lobe and executive function deficit following -- Impact on safety and function Risk for inadequate nutrition/hydration;No  limitations   CHL IP TREATMENT RECOMMENDATION 10/05/2021 Treatment Recommendations No treatment recommended at this time   Prognosis 10/05/2021 Prognosis for Safe Diet Advancement Good Barriers to Reach Goals -- Barriers/Prognosis Comment -- CHL IP DIET RECOMMENDATION 10/05/2021 SLP Diet Recommendations Thin liquid;Other (Comment) Liquid Administration via Cup;Straw Medication Administration Whole meds with puree Compensations Slow rate;Small sips/bites Postural Changes Seated upright at 90 degrees   CHL IP OTHER RECOMMENDATIONS 10/05/2021 Recommended Consults -- Oral Care Recommendations Oral care BID;Staff/trained caregiver to provide oral care Other Recommendations --   CHL IP FOLLOW UP RECOMMENDATIONS 10/05/2021 Follow up Recommendations None   CHL IP FREQUENCY AND DURATION 10/16/2015 Speech Therapy Frequency (ACUTE ONLY) min 2x/week Treatment Duration --      CHL IP ORAL PHASE 10/05/2021 Oral Phase Impaired Oral - Pudding Teaspoon -- Oral - Pudding Cup -- Oral - Honey Teaspoon -- Oral - Honey Cup -- Oral - Nectar Teaspoon -- Oral - Nectar Cup -- Oral - Nectar Straw -- Oral - Thin Teaspoon -- Oral - Thin Cup Holding of bolus Oral - Thin Straw -- Oral - Puree -- Oral - Mech Soft -- Oral - Regular -- Oral - Multi-Consistency -- Oral - Pill -- Oral Phase - Comment --  CHL IP PHARYNGEAL PHASE 10/05/2021 Pharyngeal Phase Impaired Pharyngeal- Pudding Teaspoon -- Pharyngeal -- Pharyngeal- Pudding Cup -- Pharyngeal -- Pharyngeal- Honey Teaspoon -- Pharyngeal -- Pharyngeal- Honey Cup -- Pharyngeal -- Pharyngeal- Nectar Teaspoon -- Pharyngeal -- Pharyngeal- Nectar Cup -- Pharyngeal -- Pharyngeal- Nectar Straw -- Pharyngeal -- Pharyngeal- Thin Teaspoon -- Pharyngeal -- Pharyngeal- Thin Cup Delayed swallow initiation-vallecula Pharyngeal -- Pharyngeal- Thin Straw -- Pharyngeal -- Pharyngeal- Puree -- Pharyngeal -- Pharyngeal- Mechanical Soft --  Pharyngeal -- Pharyngeal- Regular -- Pharyngeal -- Pharyngeal- Multi-consistency --  Pharyngeal -- Pharyngeal- Pill -- Pharyngeal -- Pharyngeal Comment --  CHL IP CERVICAL ESOPHAGEAL PHASE 10/05/2021 Cervical Esophageal Phase WFL Pudding Teaspoon -- Pudding Cup -- Honey Teaspoon -- Honey Cup -- Nectar Teaspoon -- Nectar Cup -- Nectar Straw -- Thin Teaspoon -- Thin Cup -- Thin Straw -- Puree -- Mechanical Soft -- Regular -- Multi-consistency -- Pill -- Cervical Esophageal Comment -- Sonia Baller, MA, CCC-SLP Speech Therapy             [place] RT UPPER EXTREM LTD SOFT TISSUE NON VASCULAR  Result Date: 10/07/2021 CLINICAL DATA:  Arm pain in the antecubital fossa EXAM: ULTRASOUND RIGHT UPPER EXTREMITY LIMITED TECHNIQUE: Ultrasound examination of the upper extremity soft tissues was performed in the area of clinical concern. COMPARISON:  None. FINDINGS: In the region of concern we demonstrate a 7.2 by 2.3 by 4.2 cm (volume = 36 cm^3) complex fluid density lesion which is possibly intramuscular or least has a thick surrounding rind. There is also an adjacent more superficial 2.1 by 0.6 by 3.6 cm (volume = 2 cm^3) collection likewise with a surrounding soft tissues which may be muscular tissues. Possibilities may include mass, infection, or hematoma. Correlate clinically and consider MRI for further workup if clinically warranted. IMPRESSION: 1. Complex cystic lesions along striated surrounding tissues which may represent muscles in the region of concern. Possibilities might include hematoma, abscess, or centrally necrotic mass. If the cause is not obvious clinically, consider MRI. Electronically Signed   By: Van Clines M.D.   On: 10/07/2021 20:02   CT RENAL STONE STUDY  Result Date: 10/08/2021 CLINICAL DATA:  Hematuria.  Right flank pain. EXAM: CT ABDOMEN AND PELVIS WITHOUT CONTRAST TECHNIQUE: Multidetector CT imaging of the abdomen and pelvis was performed following the standard protocol without IV contrast. COMPARISON:  CT abdomen pelvis 05/16/2007 report without imaging, CT abdomen  pelvis 10/01/2021 FINDINGS: Lower chest: Bilateral lower lobe subsegmental atelectasis. Cardiac findings suggestive of anemia. Hepatobiliary: No focal liver abnormality. Layering hyperdensity within the gallbladder lumen suggestive gallstones. No gallbladder wall thickening or pericholecystic fluid. No biliary dilatation. Pancreas: No focal lesion. Normal pancreatic contour. No surrounding inflammatory changes. No main pancreatic ductal dilatation. Spleen: Normal in size without focal abnormality. A splenule is noted. Adrenals/Urinary Tract: No adrenal nodule bilaterally. Bilateral renal cortical scarring. No nephrolithiasis and no hydronephrosis. There is a 4.1 cm fluid density lesion within the left kidney that likely represents a simple renal cyst. Interval development of a 2 x 1.6 x 2.6 cm hyperdense heterogeneous lesion within the right kidney. No ureterolithiasis or hydroureter. Layering hyperdensity within the urinary bladder lumen likely related to excreted intravenous contrast from recently administered IV contrast on 10/06/2021 for CT neck. Stomach/Bowel: Stomach is within normal limits. No evidence of bowel wall thickening or dilatation. Diffuse sigmoid diverticulosis. The appendix not definitely identified. Vascular/Lymphatic: No abdominal aorta or iliac aneurysm. Severe atherosclerotic plaque of the aorta and its branches. No abdominal, pelvic, or inguinal lymphadenopathy. Reproductive: Status post hysterectomy. No adnexal masses. Other: No intraperitoneal free fluid. No intraperitoneal free gas. No organized fluid collection. Musculoskeletal: No abdominal wall hernia or abnormality. No suspicious lytic or blastic osseous lesions. No acute displaced fracture. In stray shin of an age-indeterminate mild T11 compression fracture. Surgical hardware of the proximal right femoral shaft. IMPRESSION: 1. Interval development of a 2 x 1.6 x 2.6 cm hyperdensity within the right kidney that may represent blood  products/renal cast (new compared to  10/01/21). 2. Cholelithiasis. 3. Colonic diverticulosis with no acute diverticulitis. 4. Otherwise limited evaluation on this noncontrast study. Electronically Signed   By: Iven Finn M.D.   On: 10/08/2021 16:09   [place] EKG SITE RITE  Result Date: 10/11/2021 If Site Rite image not attached, placement could not be confirmed due to current cardiac rhythm.    Procedures: EGD  Subjective: Patient with no nausea or vomiting, no dyspnea or pain   Discharge Exam: Vitals:   10/15/21 2117 10/16/21 0550  BP: (!) 169/97 (!) 164/79  Pulse: 86 83  Resp: 16 16  Temp: 97.7 F (36.5 C) 98.1 F (36.7 C)  SpO2: 98% 97%   Vitals:   10/15/21 0613 10/15/21 1314 10/15/21 2117 10/16/21 0550  BP: (!) 173/90 (!) 154/77 (!) 169/97 (!) 164/79  Pulse: 76 86 86 83  Resp: 16 18 16 16   Temp: (!) 97.5 F (36.4 C) 97.7 F (36.5 C) 97.7 F (36.5 C) 98.1 F (36.7 C)  TempSrc: Oral Oral Oral Oral  SpO2: 97% 100% 98% 97%  Weight:      Height:        General: Not in pain or dyspnea  Neurology: Awake and alert, non focal  E ENT: no pallor, no icterus, oral mucosa moist Cardiovascular: No JVD. S1-S2 present, rhythmic, no gallops, rubs, or murmurs. No lower extremity edema. Pulmonary: positive breath sounds bilaterally, adequate air movement, no wheezing, rhonchi or rales. Gastrointestinal. Abdomen soft and non tender Skin. No rashes Musculoskeletal: no joint deformities   The results of significant diagnostics from this hospitalization (including imaging, microbiology, ancillary and laboratory) are listed below for reference.     Microbiology: Recent Results (from the past 240 hour(s))  Urine Culture     Status: Abnormal   Collection Time: 10/08/21 10:19 AM   Specimen: In/Out Cath Urine  Result Value Ref Range Status   Specimen Description   Final    IN/OUT CATH URINE Performed at [facility], [place] [address], [place], [place] [address]     Special Requests   Final    NONE Performed at [facility], [place] [address] [place], [place] [address]    Culture (A)  Final    60,000 COLONIES/mL PSEUDOMONAS AERUGINOSA 80,000 COLONIES/mL YEAST Standardized susceptibility testing for this organism is not available. Performed at [facility], [place] [address] [place], [place] [address]    Report Status 10/10/2021 FINAL  Final   Organism ID, Bacteria PSEUDOMONAS AERUGINOSA (A)  Final      Susceptibility   Pseudomonas aeruginosa - MIC*    CEFTAZIDIME 4 SENSITIVE Sensitive     CIPROFLOXACIN <=0.25 SENSITIVE Sensitive     GENTAMICIN <=1 SENSITIVE Sensitive     IMIPENEM 2 SENSITIVE Sensitive     PIP/TAZO 8 SENSITIVE Sensitive     CEFEPIME 2 SENSITIVE Sensitive     * 60,000 COLONIES/mL PSEUDOMONAS AERUGINOSA  Urine Culture     Status: Abnormal   Collection Time: 10/08/21  8:08 PM   Specimen: Urine, Catheterized  Result Value Ref Range Status   Specimen Description   Final    URINE, CATHETERIZED Performed at [facility], [place] [address] [place], [place] [address]    Special Requests   Final    NONE Performed at [facility], [place] [address], [place], [place] [address]    Culture 400 COLONIES/mL PSEUDOMONAS AERUGINOSA (A)  Final   Report Status 10/11/2021 FINAL  Final   Organism ID, Bacteria  PSEUDOMONAS AERUGINOSA (A)  Final      Susceptibility   Pseudomonas aeruginosa - MIC*    CEFTAZIDIME 4 SENSITIVE Sensitive     CIPROFLOXACIN <=0.25 SENSITIVE Sensitive     GENTAMICIN <=1 SENSITIVE Sensitive     IMIPENEM 1 SENSITIVE Sensitive     PIP/TAZO 8 SENSITIVE Sensitive     CEFEPIME 2 SENSITIVE Sensitive     * 400 COLONIES/mL PSEUDOMONAS AERUGINOSA  SARS CORONAVIRUS 2 (TAT 6-24 HRS) Nasopharyngeal Nasopharyngeal Swab     Status: None   Collection Time: 10/15/21  3:35 PM   Specimen: Nasopharyngeal Swab  Result Value Ref Range Status   SARS Coronavirus 2 NEGATIVE  NEGATIVE Final    Comment: (NOTE) SARS-CoV-2 target nucleic acids are NOT DETECTED.  The SARS-CoV-2 RNA is generally detectable in upper and lower respiratory specimens during the acute phase of infection. Negative results do not preclude SARS-CoV-2 infection, do not rule out co-infections with other pathogens, and should not be used as the sole basis for treatment or other patient management decisions. Negative results must be combined with clinical observations, patient history, and epidemiological information. The expected result is Negative.  Fact Sheet for Patients: SugarRoll.be  Fact Sheet for Healthcare Providers: https://www.woods-mathews.com/  This test is not yet approved or cleared by the [place] FDA and  has been authorized for detection and/or diagnosis of SARS-CoV-2 by FDA under an Emergency Use Authorization (EUA). This EUA will remain  in effect (meaning this test can be used) for the duration of the COVID-19 declaration under Se ction 564(b)(1) of the Act, 21 U.S.C. section 360bbb-3(b)(1), unless the authorization is terminated or revoked sooner.  Performed at [facility], [place] [address], [place], [place] [address]      Labs: BNP (last 3 results) No results for input(s): BNP in the last 8760 hours. Basic Metabolic Panel: Recent Labs  Lab 10/11/21 0446 10/12/21 0359 10/13/21 0136 10/14/21 0351 10/15/21 0445  NA 135 135 137 136 135  K 3.3* 4.1 3.9 4.1 4.0  CL 101 98 104 105 105  CO2 26 31 30 24  21*  GLUCOSE 104* 115* 93 81 83  BUN 7* 8 9 10 11   CREATININE 0.71 0.65 0.67 0.71 0.82  CALCIUM 7.4* 7.3* 7.7* 8.1* 8.0*  MG 1.6* 2.4 2.0 1.9 1.7  PHOS 1.6* 3.5 3.1  --  2.1*   Liver Function Tests: Recent Labs  Lab 10/12/21 0359 10/13/21 0136 10/15/21 0445  ALBUMIN 2.2* 2.1* 2.3*   No results for input(s): LIPASE, AMYLASE in the last 168 hours. No results for input(s): AMMONIA in the last 168  hours. CBC: Recent Labs  Lab 10/10/21 0408 10/11/21 0446 10/12/21 0359 10/13/21 0136 10/14/21 0351 10/15/21 0445  WBC 8.7 6.4 4.9 5.0 5.6 6.4  NEUTROABS 6.9  --   --   --   --   --   HGB 10.9* 10.8* 11.2* 10.9* 11.0* 11.0*  HCT 31.8* 31.4* 32.6* 32.7* 33.7* 34.3*  MCV 86.2 86.0 85.8 90.3 92.1 92.2  PLT 122* 156 182 224 227 243   Cardiac Enzymes: No results for input(s): CKTOTAL, CKMB, CKMBINDEX, TROPONINI in the last 168 hours. BNP: Invalid input(s): POCBNP CBG: Recent Labs  Lab 10/13/21 2015 10/14/21 0746 10/14/21 1134  GLUCAP 77 80 79   D-Dimer No results for input(s): DDIMER in the last 72 hours. Hgb A1c No results for input(s): HGBA1C in the last 72 hours. Lipid Profile No results for input(s): CHOL, HDL, LDLCALC, TRIG, CHOLHDL, LDLDIRECT in the  last 72 hours. Thyroid function studies No results for input(s): TSH, T4TOTAL, T3FREE, THYROIDAB in the last 72 hours.  Invalid input(s): FREET3 Anemia work up No results for input(s): VITAMINB12, FOLATE, FERRITIN, TIBC, IRON, RETICCTPCT in the last 72 hours. Urinalysis    Component Value Date/Time   COLORURINE AMBER (A) 10/08/2021 2008   APPEARANCEUR CLOUDY (A) 10/08/2021 2008   LABSPEC 1.014 10/08/2021 2008   PHURINE 6.0 10/08/2021 2008   GLUCOSEU NEGATIVE 10/08/2021 2008   HGBUR LARGE (A) 10/08/2021 2008   [place] NEGATIVE 10/08/2021 2008   KETONESUR 20 (A) 10/08/2021 2008   PROTEINUR 100 (A) 10/08/2021 2008   UROBILINOGEN 1.0 10/20/2015 1124   NITRITE NEGATIVE 10/08/2021 2008   LEUKOCYTESUR NEGATIVE 10/08/2021 2008   Sepsis Labs Invalid input(s): PROCALCITONIN,  WBC,  LACTICIDVEN Microbiology Recent Results (from the past 240 hour(s))  Urine Culture     Status: Abnormal   Collection Time: 10/08/21 10:19 AM   Specimen: In/Out Cath Urine  Result Value Ref Range Status   Specimen Description   Final    IN/OUT CATH URINE Performed at [facility], [place] [address], [place],  [place] [address]    Special Requests   Final    NONE Performed at [facility], [place] [address] [place], [place] [address]    Culture (A)  Final    60,000 COLONIES/mL PSEUDOMONAS AERUGINOSA 80,000 COLONIES/mL YEAST Standardized susceptibility testing for this organism is not available. Performed at [facility], [place] [address], [place], [place] [address]    Report Status 10/10/2021 FINAL  Final   Organism ID, Bacteria PSEUDOMONAS AERUGINOSA (A)  Final      Susceptibility   Pseudomonas aeruginosa - MIC*    CEFTAZIDIME 4 SENSITIVE Sensitive     CIPROFLOXACIN <=0.25 SENSITIVE Sensitive     GENTAMICIN <=1 SENSITIVE Sensitive     IMIPENEM 2 SENSITIVE Sensitive     PIP/TAZO 8 SENSITIVE Sensitive     CEFEPIME 2 SENSITIVE Sensitive     * 60,000 COLONIES/mL PSEUDOMONAS AERUGINOSA  Urine Culture     Status: Abnormal   Collection Time: 10/08/21  8:08 PM   Specimen: Urine, Catheterized  Result Value Ref Range Status   Specimen Description   Final    URINE, CATHETERIZED Performed at [facility], [place] [address] [place] [address], [place], [place] [address]    Special Requests   Final    NONE Performed at [facility], [place] [address] [place], [place] [address]    Culture 400 COLONIES/mL PSEUDOMONAS AERUGINOSA (A)  Final   Report Status 10/11/2021 FINAL  Final   Organism ID, Bacteria PSEUDOMONAS AERUGINOSA (A)  Final      Susceptibility   Pseudomonas aeruginosa - MIC*    CEFTAZIDIME 4 SENSITIVE Sensitive     CIPROFLOXACIN <=0.25 SENSITIVE Sensitive     GENTAMICIN <=1 SENSITIVE Sensitive     IMIPENEM 1 SENSITIVE Sensitive     PIP/TAZO 8 SENSITIVE Sensitive     CEFEPIME 2 SENSITIVE Sensitive     * 400 COLONIES/mL PSEUDOMONAS AERUGINOSA  SARS CORONAVIRUS 2 (TAT 6-24 HRS) Nasopharyngeal Nasopharyngeal Swab     Status: None   Collection Time: 10/15/21  3:35 PM   Specimen: Nasopharyngeal Swab  Result Value Ref Range Status   SARS Coronavirus 2  NEGATIVE NEGATIVE Final    Comment: (NOTE) SARS-CoV-2 target nucleic acids are NOT DETECTED.  The SARS-CoV-2 RNA is generally detectable in upper and lower respiratory specimens during the acute phase of infection.  Negative results do not preclude SARS-CoV-2 infection, do not rule out co-infections with other pathogens, and should not be used as the sole basis for treatment or other patient management decisions. Negative results must be combined with clinical observations, patient history, and epidemiological information. The expected result is Negative.  Fact Sheet for Patients: SugarRoll.be  Fact Sheet for Healthcare Providers: https://www.woods-mathews.com/  This test is not yet approved or cleared by the [place] FDA and  has been authorized for detection and/or diagnosis of SARS-CoV-2 by FDA under an Emergency Use Authorization (EUA). This EUA will remain  in effect (meaning this test can be used) for the duration of the COVID-19 declaration under Se ction 564(b)(1) of the Act, 21 U.S.C. section 360bbb-3(b)(1), unless the authorization is terminated or revoked sooner.  Performed at [facility], [place] [address] [place], [place] [address]      Time coordinating discharge: 45 minutes  SIGNED:   Tawni Millers, MD  [facility] 10/16/2021, 9:03 AM

## 2021-10-16 NOTE — TOC Transition Note (Signed)
Transition of Care [facility]) - CM/SW Discharge Note   Patient Details  Name: Nicole Ruiz MRN: [id number] Date of Birth: 04-24-1941  [facility] CM/SW Contact:  Lennart Pall, LCSW Phone Number: 10/16/2021, 12:00 PM   Clinical Narrative:    Pt medically cleared for dc today to SNF and bed accepted at [facility].  Have received insurance authorization. Pt and spouse agreeable with transfer today.  PTAR called at 11:50.  RN to call report to [phone].  No further needs.   Final next level of care: Skilled Nursing Facility Barriers to Discharge: Barriers Resolved   Patient Goals and CMS Choice Patient states their goals for this hospitalization and ongoing recovery are:: to go home CMS Medicare.gov Compare Post Acute Care list provided to:: Patient Choice offered to / list presented to : Patient  Discharge Placement   Existing PASRR number confirmed : 10/15/21          Patient chooses bed at: [facility] Patient to be transferred to facility by: [place] Name of family member notified: spouse Patient and family notified of of transfer: 10/16/21  Discharge Plan and Services   Discharge Planning Services: CM Consult            DME Arranged: N/A DME Agency: NA                  Social Determinants of Health ([place]) Interventions     Readmission Risk Interventions No flowsheet data found.

## 2021-10-16 NOTE — Progress Notes (Signed)
Report called, no IV, belongings packed, PTAR called.

## 2021-10-16 NOTE — Care Management Important Message (Signed)
Important Message  Patient Details IM Letter placed in Patients room. Name: Nicole Ruiz MRN: [id number] Date of Birth: 1941-02-20   Medicare Important Message Given:  Yes     Kerin Salen 10/16/2021, 12:21 PM

## 2021-11-04 ENCOUNTER — Emergency Department (HOSPITAL_COMMUNITY)
Admission: EM | Admit: 2021-11-04 | Discharge: 2021-11-04 | Disposition: A | Discharge status: Home / Self Care | Payer: Medicare HMO | Attending: Emergency Medicine | Admitting: Emergency Medicine

## 2021-11-04 ENCOUNTER — Emergency Department (HOSPITAL_COMMUNITY): Payer: Medicare HMO

## 2021-11-04 DIAGNOSIS — Z7901 Long term (current) use of anticoagulants: Secondary | ICD-10-CM | POA: Diagnosis not present

## 2021-11-04 DIAGNOSIS — I129 Hypertensive chronic kidney disease with stage 1 through stage 4 chronic kidney disease, or unspecified chronic kidney disease: Secondary | ICD-10-CM | POA: Insufficient documentation

## 2021-11-04 DIAGNOSIS — W01198A Fall on same level from slipping, tripping and stumbling with subsequent striking against other object, initial encounter: Secondary | ICD-10-CM | POA: Insufficient documentation

## 2021-11-04 DIAGNOSIS — N183 Chronic kidney disease, stage 3 unspecified: Secondary | ICD-10-CM | POA: Insufficient documentation

## 2021-11-04 DIAGNOSIS — E039 Hypothyroidism, unspecified: Secondary | ICD-10-CM | POA: Diagnosis not present

## 2021-11-04 DIAGNOSIS — S0990XA Unspecified injury of head, initial encounter: Secondary | ICD-10-CM | POA: Diagnosis present

## 2021-11-04 DIAGNOSIS — W19XXXA Unspecified fall, initial encounter: Secondary | ICD-10-CM

## 2021-11-04 DIAGNOSIS — Z79899 Other long term (current) drug therapy: Secondary | ICD-10-CM | POA: Insufficient documentation

## 2021-11-04 DIAGNOSIS — S0101XA Laceration without foreign body of scalp, initial encounter: Secondary | ICD-10-CM | POA: Insufficient documentation

## 2021-11-04 DIAGNOSIS — Z8543 Personal history of malignant neoplasm of ovary: Secondary | ICD-10-CM | POA: Diagnosis not present

## 2021-11-04 LAB — CBC
HCT: 36.2 % (ref 36.0–46.0)
Hemoglobin: 12 g/dL (ref 12.0–15.0)
MCH: 30.5 pg (ref 26.0–34.0)
MCHC: 33.1 g/dL (ref 30.0–36.0)
MCV: 91.9 fL (ref 80.0–100.0)
Platelets: 324 10*3/uL (ref 150–400)
RBC: 3.94 MIL/uL (ref 3.87–5.11)
RDW: 17.5 % — ABNORMAL HIGH (ref 11.5–15.5)
WBC: 7.6 10*3/uL (ref 4.0–10.5)
nRBC: 0 % (ref 0.0–0.2)

## 2021-11-04 LAB — COMPREHENSIVE METABOLIC PANEL
ALT: 24 U/L (ref 0–44)
AST: 24 U/L (ref 15–41)
Albumin: 2.6 g/dL — ABNORMAL LOW (ref 3.5–5.0)
Alkaline Phosphatase: 83 U/L (ref 38–126)
Anion gap: 14 (ref 5–15)
BUN: 18 mg/dL (ref 8–23)
CO2: 20 mmol/L — ABNORMAL LOW (ref 22–32)
Calcium: 8.6 mg/dL — ABNORMAL LOW (ref 8.9–10.3)
Chloride: 105 mmol/L (ref 98–111)
Creatinine, Ser: 1.12 mg/dL — ABNORMAL HIGH (ref 0.44–1.00)
GFR, Estimated: 50 mL/min — ABNORMAL LOW (ref >60)
Glucose, Bld: 105 mg/dL — ABNORMAL HIGH (ref 70–99)
Potassium: 3.5 mmol/L (ref 3.5–5.1)
Sodium: 139 mmol/L (ref 135–145)
Total Bilirubin: 1.2 mg/dL (ref 0.3–1.2)
Total Protein: 5.6 g/dL — ABNORMAL LOW (ref 6.5–8.1)

## 2021-11-04 MED ORDER — SODIUM CHLORIDE 0.9 % IV BOLUS (SEPSIS)
500.0000 mL | Freq: Once | INTRAVENOUS | Status: AC
  Administered 2021-11-04: 500 mL via INTRAVENOUS

## 2021-11-04 MED ORDER — SODIUM CHLORIDE 0.9 % IV SOLN
1000.0000 mL | INTRAVENOUS | Status: DC
  Administered 2021-11-04: 1000 mL via INTRAVENOUS

## 2021-11-04 MED ORDER — ACETAMINOPHEN 325 MG PO TABS
650.0000 mg | ORAL_TABLET | Freq: Once | ORAL | Status: AC
  Administered 2021-11-04: 650 mg via ORAL
  Filled 2021-11-04: qty 2

## 2021-11-04 NOTE — ED Notes (Signed)
Patient returns from ct scan

## 2021-11-04 NOTE — ED Notes (Signed)
Called PTAR to transport patient home, patient is currently 3rd on the list.

## 2021-11-04 NOTE — Discharge Instructions (Addendum)
Your staples should be removed in about 1 week.  There was just one placed tonight.  The x-rays did not show any signs of acute fractures.  Your laboratory tests were reassuring.

## 2021-11-04 NOTE — Progress Notes (Signed)
Orthopedic Tech Progress Note Patient Details:  Nicole Ruiz 13-Jan-1941 [id number]  Level 2 trauma  Patient ID: Rise Mu, female   DOB: 05/14/1941, 80 y.o.   MRN: [id number]  Janit Pagan 11/04/2021, 4:52 PM

## 2021-11-04 NOTE — ED Notes (Signed)
Pt unable to ambulate in hallway. RN is aware.

## 2021-11-04 NOTE — ED Notes (Signed)
Patient remains in CT scan 

## 2021-11-04 NOTE — Care Management (Signed)
ED RNCM met with patient in rm25 to discuss [place] recommendation.  Patient reports that she lives at home with her husband.  and was recently in SNF but opted to go home before rehab was completed.  Patient is agreeable to [facility] services for reconditioning. Patient is unable to tell me if she currently have [facility] services, but according to  Patient Nicole Ruiz  patient was set up with [facility] 11/4,  RNCM is unable to see the disciples that were set up, orders faxed in for resumption of services. Updated EDP who is agreeable with plan. Information all placed on patient's AVS. Patient will be discharged home and transported via PTAR patient is unable to ambulate.  No further ED RNCM needs identified.

## 2021-11-04 NOTE — ED Notes (Signed)
Pt to radiology.

## 2021-11-04 NOTE — ED Triage Notes (Signed)
Pt here via [facility] after rolling out of bed.  On Eliquis.  Now c/o L lateral rib pain and L parietal pain with some controlled bleeding noted.  Pt ao x 3 per norm.

## 2021-11-04 NOTE — ED Provider Notes (Signed)
[facility] EMERGENCY DEPARTMENT Provider Note   CSN: [id number] Arrival date & time: 11/04/21  1636     History Chief Complaint  Patient presents with   Lytle Michaels    Nicole Ruiz is a 80 y.o. female.  HPI. Patient presents to the ED for evaluation after a fall.  Patient states she was trying to get out of bed when she was rolling over and fell.  Patient hit her head on the bedside table.  She believes she lost consciousness.  Was noted to have a laceration on the side of her head.  Patient's husband had left the house and the caregiver was there.  They found her on the floor and called EMS.  Patient denies any trouble with any chest pain or shortness of breath.  She does states she is hurting all over after the fall.  She denies any numbness or weakness.  No recent fevers or chills.  Patient does have a history of pulmonary embolism and is on anticoagulation.  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[place])    Chronic kidney disease    Depression    Hyperlipidemia    Hypertension    Stroke [facility])     Patient Active Problem List   Diagnosis Date Noted   Acute gastric ulcer without hemorrhage or perforation    Esophageal ring    Intramuscular hematoma    Anemia 10/08/2021   Nausea and vomiting 10/08/2021   Hematuria 10/08/2021   Dehydration    Oropharyngeal dysphagia    Failure to thrive (child)    UTI (urinary tract infection) 10/02/2021   AKI (acute kidney injury) ([place]) 10/01/2021   Pulmonary embolism, bilateral ([place]) 03/26/2021   Hypothyroid 03/04/2021   Acute respiratory failure ([place]) 03/01/2021   At risk for falling 12/20/2015   Cerebrovascular accident, 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   Femur fracture, right ([place]) 10/20/2015   Fracture of distal femur, right, closed, with routine healing, subsequent encounter 10/20/2015   Fracture of distal end of femur ([place]) 10/20/2015   Fall  10/19/2015   Dyslipidemia 10/19/2015   Nodule of right lung 10/19/2015   Lung mass 10/19/2015   CVA (cerebral infarction) 10/15/2015   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   Hyperlipidemia 10/15/2015   Stroke (cerebrum) ([place]) 10/15/2015   Cerebral infarction ([place]) 10/15/2015   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/15/2015   Cerebrovascular accident (CVA) ([place]) 10/15/2015   Episode of syncope 07/17/2015   Arthralgia of hip 07/09/2015   Idiopathic insomnia 07/09/2015   Arthralgia of lower leg 04/06/2014   Unspecified arthropathy, lower leg 12/18/2013   Acute posthemorrhagic anemia 12/18/2013   Esophageal reflux 12/18/2013   Acid reflux 12/18/2013   Abnormal finding on radiology exam 09/19/2013   Acquired cyst of kidney 09/19/2013   CKD (chronic kidney disease), stage III ([place]) 09/19/2013   Clinical depression 09/19/2013   Bone/cartilage disorder 09/19/2013   Arthritis of knee, degenerative 09/19/2013   Avitaminosis D 09/19/2013   Hypertension 05/29/2012   Closed fracture of metatarsal bone 12/18/2011   HYPERLIPIDEMIA 03/06/2009   DIZZINESS 03/06/2009   Palpitations 03/06/2009   CHEST PAIN 03/06/2009    Past Surgical History:  Procedure Laterality Date   ABDOMINAL HYSTERECTOMY     ARM SURGERY     BIOPSY  10/09/2021   Procedure: BIOPSY;  Surgeon: Irene Shipper, MD;  Location: [facility];  Service:  Endoscopy;;   CARDIAC CATHETERIZATION  2013   negative   EP IMPLANTABLE DEVICE N/A 10/17/2015   Procedure: Loop Recorder Insertion;  Surgeon: Will Meredith Leeds, MD;  Location: [facility] CV LAB;  Service: Cardiovascular;  Laterality: N/A;   ESOPHAGOGASTRODUODENOSCOPY (EGD) WITH PROPOFOL N/A 10/09/2021   Procedure: ESOPHAGOGASTRODUODENOSCOPY (EGD) WITH PROPOFOL;  Surgeon: Irene Shipper, MD;  Location: [facility] ENDOSCOPY;  Service: Endoscopy;  Laterality: N/A;   EYE SURGERY     FEMUR IM NAIL Right 10/20/2015   Procedure: INTRAMEDULLARY (IM)  RETROGRADE FEMORAL NAILING;  Surgeon: Tania Ade, MD;  Location: [place];  Service: Orthopedics;  Laterality: Right;   HEMORROIDECTOMY     KNEE SURGERY     TEE WITHOUT CARDIOVERSION N/A 10/17/2015   Procedure: TRANSESOPHAGEAL ECHOCARDIOGRAM (TEE);  Surgeon: Jerline Pain, MD;  Location: [facility] ENDOSCOPY;  Service: Cardiovascular;  Laterality: N/A;     OB History   No obstetric history on file.     Family History  Problem Relation Age of Onset   Heart disease Mother 78   Stroke Father    Heart disease Brother 80    Social History   Tobacco Use   Smoking status: Never   Smokeless tobacco: Never  Substance Use Topics   Alcohol use: No   Drug use: No    Home Medications Prior to Admission medications   Medication Sig Start Date End Date Taking? Authorizing Provider  ALPRAZolam (XANAX XR) 0.5 MG 24 hr tablet Take 1 tablet (0.5 mg total) by mouth daily. 10/16/21   Arrien, Jimmy Picket, MD  apixaban (ELIQUIS) 5 MG TABS tablet TAKE 1 TABLET (5 MG TOTAL) BY MOUTH TWO TIMES DAILY. 03/06/21 03/06/22  Nita Sells, MD  atorvastatin (LIPITOR) 20 MG tablet Take 20 mg by mouth daily. 08/16/18   [provider]  buPROPion (WELLBUTRIN XL) 150 MG 24 hr tablet Take by mouth. 06/23/18   [provider]  Cholecalciferol (VITAMIN D3) 1000 units CAPS Take by mouth.    [provider]  donepezil (ARICEPT) 10 MG tablet Take 10 mg by mouth daily. 12/24/20   [provider]  feeding supplement (ENSURE ENLIVE / ENSURE PLUS) LIQD Take 237 mLs by mouth 3 (three) times daily between meals. 10/16/21 11/15/21  Arrien, Jimmy Picket, MD  FLUoxetine (PROZAC) 40 MG capsule Take 1 capsule (40 mg total) by mouth daily. 05/17/18 05/17/19  Frann Rider, NP  levothyroxine (SYNTHROID, LEVOTHROID) 50 MCG tablet  07/27/18   [provider]  memantine (NAMENDA) 10 MG tablet Take 10 mg by mouth 2 (two) times daily. 12/11/20   [provider]  Omega-3 Fatty  Acids (FISH OIL) 1000 MG CAPS Take by mouth.    [provider]  pantoprazole (PROTONIX) 40 MG tablet Take 1 tablet (40 mg total) by mouth 2 (two) times daily for 15 days. After 15 days, will change to daily. 10/16/21 10/31/21  Arrien, Jimmy Picket, MD  polyvinyl alcohol (LIQUIFILM TEARS) 1.4 % ophthalmic solution Place 1 drop into both eyes every hour as needed for dry eyes.    [provider]  primidone (MYSOLINE) 50 MG tablet Take 50 mg by mouth 2 (two) times daily. Take 2 tablets in the morning and 1 tablet in the evening.    [provider]  propranolol (INDERAL) 10 MG tablet Take 1 tablet (10 mg total) by mouth 2 (two) times daily. 03/06/21   Nita Sells, MD  UNABLE TO FIND Mega red take one daily    [provider]  Vitamin D, Ergocalciferol, (DRISDOL) 1.25 MG (50000 UNIT) CAPS capsule Take 50,000 Units by mouth every 14 (fourteen) days. 01/07/21   [provider]    Allergies    Patient has no known allergies.  Review of Systems   Review of Systems  All other systems reviewed and are negative.  Physical Exam Updated Vital Signs BP (!) 147/84   Pulse 89   Temp 98 F (36.7 C) (Oral)   Resp 14   Ht 1.6 m (5\' 3" )   Wt 57.7 kg   SpO2 98%   BMI 22.53 kg/m   Physical Exam Vitals and nursing note reviewed.  Constitutional:      General: She is not in acute distress.    Appearance: She is well-developed.  HENT:     Head: Normocephalic.     Comments: Dried blood noted on the left posterior parietal scalp    Right Ear: External ear normal.     Left Ear: External ear normal.  Eyes:     General: No scleral icterus.       Right eye: No discharge.        Left eye: No discharge.     Conjunctiva/sclera: Conjunctivae normal.  Neck:     Trachea: No tracheal deviation.  Cardiovascular:     Rate and Rhythm: Normal rate and regular rhythm.  Pulmonary:     Effort: Pulmonary effort is normal. No respiratory distress.     Breath  sounds: Normal breath sounds. No stridor. No wheezing or rales.  Abdominal:     General: Bowel sounds are normal. There is no distension.     Palpations: Abdomen is soft.     Tenderness: There is no abdominal tenderness. There is no guarding or rebound.  Musculoskeletal:        General: No deformity.     Cervical back: Neck supple. Tenderness present.     Thoracic back: Tenderness present.     Lumbar back: Tenderness present.     Comments: No tenderness palpation in the upper or lower extremities  Skin:    General: Skin is warm and dry.     Findings: No rash.  Neurological:     General: No focal deficit present.     Mental Status: She is alert.     Cranial Nerves: No cranial nerve deficit (no facial droop, extraocular movements intact, no slurred speech).     Sensory: No sensory deficit.     Motor: No abnormal muscle tone or seizure activity.     Coordination: Coordination normal.     Comments: No focal deficits noted, answers questions appropriately, equal strength and sensation bilateral upper and lower extremities, no facial droop, extraocular movements intact, no slurred speech  Psychiatric:        Mood and Affect: Mood normal.    ED Results / Procedures / Treatments   Labs (all labs ordered are listed, but only abnormal results are displayed) Labs Reviewed  CBC - Abnormal; Notable for the following components:      Result Value   RDW 17.5 (*)    All other components within normal limits  COMPREHENSIVE METABOLIC PANEL - Abnormal; Notable for the following components:   CO2 20 (*)    Glucose, Bld 105 (*)    Creatinine, Ser 1.12 (*)    Calcium 8.6 (*)    Total Protein 5.6 (*)    Albumin 2.6 (*)    GFR, Estimated 50 (*)    All other components within normal limits  EKG EKG Interpretation  Date/Time:  Tuesday November 04 2021 16:42:47 EST Ventricular Rate:  106 PR Interval:  146 QRS Duration: 154 QT Interval:  378 QTC Calculation: 476 R Axis:   -12 Text  Interpretation: undetermined rhythm,  poor data quality Probable left ventricular hypertrophy Borderline T abnormalities, lateral leads Artifact in lead(s) I II III aVR aVL aVF Since last tracing rate faster Artifact Confirmed by Dorie Rank [phone]) on 11/04/2021 4:48:46 PM  Radiology DG Chest 2 View  Result Date: 11/04/2021 CLINICAL DATA:  Fall with pain EXAM: CHEST - 2 VIEW COMPARISON:  10/01/2021 FINDINGS: The heart size and mediastinal contours are within normal limits. Aortic atherosclerosis. Both lungs are clear. The visualized skeletal structures are unremarkable. Recording device over the left chest. IMPRESSION: No active cardiopulmonary disease. Electronically Signed   By: Donavan Foil M.D.   On: 11/04/2021 18:55   DG Thoracic Spine 2 View  Result Date: 11/04/2021 CLINICAL DATA:  Fall with pain EXAM: THORACIC SPINE 2 VIEWS COMPARISON:  07/14/2017, CT 03/01/2021. FINDINGS: Mild scoliosis. Slight wedging of T11 is age indeterminate. Remaining vertebra demonstrate normal stature IMPRESSION: 1. Mild age indeterminate wedging of T11. 2. Scoliosis Electronically Signed   By: Donavan Foil M.D.   On: 11/04/2021 18:59   DG Lumbar Spine Complete  Result Date: 11/04/2021 CLINICAL DATA:  Fall with pain EXAM: LUMBAR SPINE - COMPLETE 4+ VIEW COMPARISON:  08/25/2017 FINDINGS: Mild scoliosis. 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 Vertebral body heights are maintained. Moderate disc space narrowing and degenerative change at L2-L3 and L5-S1. Facet degenerative changes of the lower lumbar spine. IMPRESSION: Scoliosis and degenerative changes.  No acute osseous abnormality Electronically Signed   By: Donavan Foil M.D.   On: 11/04/2021 18:57   CT Head Wo Contrast  Result Date: 11/04/2021 CLINICAL DATA:  Head trauma fell out of bed EXAM: CT HEAD WITHOUT CONTRAST CT CERVICAL SPINE WITHOUT CONTRAST TECHNIQUE: Multidetector CT imaging of the head and cervical spine was performed following the standard protocol  without intravenous contrast. Multiplanar CT image reconstructions of the cervical spine were also generated. COMPARISON:  CT brain 10/01/2021, MRI 03/24/2018, CT neck 10/06/2021, CT chest 03/01/2021 FINDINGS: CT HEAD FINDINGS Brain: No acute territorial infarction, hemorrhage, or intracranial mass. Chronic left greater than right cerebellar infarcts. Small chronic infarcts within the right basal ganglial a and sub insula. Atrophy and chronic small vessel ischemic changes of the white matter. Stable ventricle size Vascular: No hyperdense vessels.  Carotid vascular calcification Skull: Normal. Negative for fracture or focal lesion. Sinuses/Orbits: No acute finding. Other: None CT CERVICAL SPINE FINDINGS Alignment: Normal. Skull base and vertebrae: Chronic appearing superior endplate deformities at T1 and T2. Cervical vertebral bodies show normal stature and no fracture Soft tissues and spinal canal: No prevertebral fluid or swelling. No visible canal hematoma. Disc levels: Multilevel degenerative changes. Advanced disease at C4-C5, C5-C6 and C6-C7. Facet degenerative changes at multiple levels with foraminal narrowing Upper chest: Stable punctate nodule at the right apex Other: None IMPRESSION: 1. No CT evidence for acute intracranial abnormality. Atrophy and chronic small vessel ischemic change of the white matter. 2. Degenerative changes of the cervical spine. No acute osseous abnormality Electronically Signed   By: Donavan Foil M.D.   On: 11/04/2021 18:54   CT Cervical Spine Wo Contrast  Result Date: 11/04/2021 CLINICAL DATA:  Head trauma fell out of bed EXAM: CT HEAD WITHOUT CONTRAST CT CERVICAL SPINE WITHOUT CONTRAST TECHNIQUE: Multidetector CT imaging of the head and cervical spine was performed  following the standard protocol without intravenous contrast. Multiplanar CT image reconstructions of the cervical spine were also generated. COMPARISON:  CT brain 10/01/2021, MRI 03/24/2018, CT neck 10/06/2021, CT  chest 03/01/2021 FINDINGS: CT HEAD FINDINGS Brain: No acute territorial infarction, hemorrhage, or intracranial mass. Chronic left greater than right cerebellar infarcts. Small chronic infarcts within the right basal ganglial a and sub insula. Atrophy and chronic small vessel ischemic changes of the white matter. Stable ventricle size Vascular: No hyperdense vessels.  Carotid vascular calcification Skull: Normal. Negative for fracture or focal lesion. Sinuses/Orbits: No acute finding. Other: None CT CERVICAL SPINE FINDINGS Alignment: Normal. Skull base and vertebrae: Chronic appearing superior endplate deformities at T1 and T2. Cervical vertebral bodies show normal stature and no fracture Soft tissues and spinal canal: No prevertebral fluid or swelling. No visible canal hematoma. Disc levels: Multilevel degenerative changes. Advanced disease at C4-C5, C5-C6 and C6-C7. Facet degenerative changes at multiple levels with foraminal narrowing Upper chest: Stable punctate nodule at the right apex Other: None IMPRESSION: 1. No CT evidence for acute intracranial abnormality. Atrophy and chronic small vessel ischemic change of the white matter. 2. Degenerative changes of the cervical spine. No acute osseous abnormality Electronically Signed   By: Donavan Foil M.D.   On: 11/04/2021 18:54   DG Knee Complete 4 Views Right  Result Date: 11/04/2021 CLINICAL DATA:  Fall with knee pain EXAM: RIGHT KNEE - COMPLETE 4+ VIEW COMPARISON:  Femur radiograph 10/20/2015 FINDINGS: Status post knee replacement. Prior intramedullary rodding and distal screw fixation of the femur. Old distal femoral fracture deformity. No acute displaced fracture or malalignment. IMPRESSION: Postsurgical changes of the right knee and distal femur. No acute osseous abnormality Electronically Signed   By: Donavan Foil M.D.   On: 11/04/2021 19:00    Procedures .Marland KitchenLaceration Repair  Date/Time: 11/04/2021 7:00 PM Performed by: Dorie Rank, MD Authorized  by: Dorie Rank, MD   Consent:    Consent obtained:  Verbal   Consent given by:  Patient   Risks discussed:  Infection and pain Universal protocol:    Procedure explained and questions answered to patient or proxy's satisfaction: yes     Immediately prior to procedure, a time out was called: yes     Patient identity confirmed:  Verbally with patient Anesthesia:    Anesthesia method:  None Laceration details:    Location:  Scalp   Length (cm):  0.8 Treatment:    Area cleansed with:  Shur-Clens and povidone-iodine   Amount of cleaning:  Standard Skin repair:    Repair method:  Staples   Number of staples:  1 Approximation:    Approximation:  Close   Medications Ordered in ED Medications  sodium chloride 0.9 % bolus 500 mL (500 mLs Intravenous New Bag/Given 11/04/21 1812)    Followed by  0.9 %  sodium chloride infusion (1,000 mLs Intravenous New Bag/Given 11/04/21 1933)  acetaminophen (TYLENOL) tablet 650 mg (650 mg Oral Given 11/04/21 1704)    ED Course  I have reviewed the triage vital signs and the nursing notes.  Pertinent labs & imaging results that were available during my care of the patient were reviewed by me and considered in my medical decision making (see chart for details).  Clinical Course as of 11/04/21 2016  Tue Nov 04, 2021  1834 Comprehensive metabolic panel(!) Protein level low otherwise no significant abnormalities. [EA]  5409 CBC(!) CBC is normal [JK]  1931 Knee x-ray without acute findings.  Lumbar spine x-ray without acute  findings [JK]  1932 Age-indeterminate wedging noted at T11 [JK]  1932 We will see if patient is able to ambulate here in the ED [JK]  2006 Discussed results with husband.  Pt has not been walking for several  weeks now.  SHe was in a rehab facility but did not want to stay there so she is at home. [JK]    Clinical Course User Index [JK] Dorie Rank, MD   MDM Rules/Calculators/A&P                           Patient presented for  fall out of the bed at home.  Patient was checked for any signs of serious head or C-spine injury with her scalp laceration and use of Eliquis.  No abnormalities were noted.  Plain films were also checked on her spine and leg and no acute abnormalities were noted.  Patient's vitals have remained stable.  No signs of acute infection.  No anemia.  No dehydration.  Attempted to see if the patient would ambulate but she does not want to at this point.  I discussed the findings with the patient's husband and he states she has not really been walking now for at least 5 to 6 weeks related to strokes and dementia.  Patient was in a rehab facility for couple of weeks but did not want to be there so her husband brought her home.  I will ask for case management to make sure they have some assistance available to them at home.  Otherwise no indications for hospitalization at this time Final Clinical Impression(s) / ED Diagnoses Final diagnoses:  Fall, initial encounter  Laceration of scalp, initial encounter    Rx / DC Orders ED Discharge Orders     None        Dorie Rank, MD 11/04/21 2016

## 2021-11-22 ENCOUNTER — Encounter (HOSPITAL_COMMUNITY): Payer: Self-pay | Admitting: Emergency Medicine

## 2021-11-22 ENCOUNTER — Emergency Department (HOSPITAL_COMMUNITY): Payer: Medicare HMO

## 2021-11-22 ENCOUNTER — Inpatient Hospital Stay (HOSPITAL_COMMUNITY): Payer: Medicare HMO

## 2021-11-22 ENCOUNTER — Other Ambulatory Visit: Payer: Self-pay

## 2021-11-22 ENCOUNTER — Inpatient Hospital Stay (HOSPITAL_COMMUNITY)
Admission: EM | Admit: 2021-11-22 | Discharge: 2021-11-25 | DRG: 689 | Disposition: A | Discharge status: Hospice | Payer: Medicare HMO | Attending: Student | Admitting: Student

## 2021-11-22 DIAGNOSIS — G9341 Metabolic encephalopathy: Secondary | ICD-10-CM | POA: Diagnosis present

## 2021-11-22 DIAGNOSIS — Z8249 Family history of ischemic heart disease and other diseases of the circulatory system: Secondary | ICD-10-CM | POA: Diagnosis not present

## 2021-11-22 DIAGNOSIS — E43 Unspecified severe protein-calorie malnutrition: Secondary | ICD-10-CM | POA: Diagnosis present

## 2021-11-22 DIAGNOSIS — Z8711 Personal history of peptic ulcer disease: Secondary | ICD-10-CM

## 2021-11-22 DIAGNOSIS — K222 Esophageal obstruction: Secondary | ICD-10-CM | POA: Diagnosis present

## 2021-11-22 DIAGNOSIS — Z20822 Contact with and (suspected) exposure to covid-19: Secondary | ICD-10-CM | POA: Diagnosis present

## 2021-11-22 DIAGNOSIS — Z515 Encounter for palliative care: Secondary | ICD-10-CM

## 2021-11-22 DIAGNOSIS — Z8673 Personal history of transient ischemic attack (TIA), and cerebral infarction without residual deficits: Secondary | ICD-10-CM | POA: Diagnosis not present

## 2021-11-22 DIAGNOSIS — I129 Hypertensive chronic kidney disease with stage 1 through stage 4 chronic kidney disease, or unspecified chronic kidney disease: Secondary | ICD-10-CM | POA: Diagnosis present

## 2021-11-22 DIAGNOSIS — E872 Acidosis, unspecified: Secondary | ICD-10-CM | POA: Diagnosis present

## 2021-11-22 DIAGNOSIS — N183 Chronic kidney disease, stage 3 unspecified: Secondary | ICD-10-CM | POA: Diagnosis present

## 2021-11-22 DIAGNOSIS — I10 Essential (primary) hypertension: Secondary | ICD-10-CM

## 2021-11-22 DIAGNOSIS — Z86711 Personal history of pulmonary embolism: Secondary | ICD-10-CM | POA: Diagnosis not present

## 2021-11-22 DIAGNOSIS — F03918 Unspecified dementia, unspecified severity, with other behavioral disturbance: Secondary | ICD-10-CM | POA: Diagnosis not present

## 2021-11-22 DIAGNOSIS — Z79899 Other long term (current) drug therapy: Secondary | ICD-10-CM

## 2021-11-22 DIAGNOSIS — B952 Enterococcus as the cause of diseases classified elsewhere: Secondary | ICD-10-CM | POA: Diagnosis present

## 2021-11-22 DIAGNOSIS — R6251 Failure to thrive (child): Secondary | ICD-10-CM | POA: Diagnosis not present

## 2021-11-22 DIAGNOSIS — W19XXXA Unspecified fall, initial encounter: Secondary | ICD-10-CM

## 2021-11-22 DIAGNOSIS — I2699 Other pulmonary embolism without acute cor pulmonale: Secondary | ICD-10-CM

## 2021-11-22 DIAGNOSIS — R319 Hematuria, unspecified: Secondary | ICD-10-CM | POA: Diagnosis not present

## 2021-11-22 DIAGNOSIS — E222 Syndrome of inappropriate secretion of antidiuretic hormone: Secondary | ICD-10-CM | POA: Diagnosis present

## 2021-11-22 DIAGNOSIS — E785 Hyperlipidemia, unspecified: Secondary | ICD-10-CM | POA: Diagnosis present

## 2021-11-22 DIAGNOSIS — Z681 Body mass index (BMI) 19 or less, adult: Secondary | ICD-10-CM

## 2021-11-22 DIAGNOSIS — R627 Adult failure to thrive: Secondary | ICD-10-CM | POA: Diagnosis present

## 2021-11-22 DIAGNOSIS — Z7189 Other specified counseling: Secondary | ICD-10-CM | POA: Diagnosis not present

## 2021-11-22 DIAGNOSIS — N390 Urinary tract infection, site not specified: Principal | ICD-10-CM

## 2021-11-22 DIAGNOSIS — Z7901 Long term (current) use of anticoagulants: Secondary | ICD-10-CM

## 2021-11-22 DIAGNOSIS — Z789 Other specified health status: Secondary | ICD-10-CM | POA: Diagnosis not present

## 2021-11-22 DIAGNOSIS — E876 Hypokalemia: Secondary | ICD-10-CM | POA: Diagnosis present

## 2021-11-22 DIAGNOSIS — N3000 Acute cystitis without hematuria: Secondary | ICD-10-CM | POA: Diagnosis not present

## 2021-11-22 DIAGNOSIS — F03C3 Unspecified dementia, severe, with mood disturbance: Secondary | ICD-10-CM | POA: Diagnosis present

## 2021-11-22 DIAGNOSIS — R509 Fever, unspecified: Secondary | ICD-10-CM | POA: Diagnosis present

## 2021-11-22 DIAGNOSIS — M6282 Rhabdomyolysis: Secondary | ICD-10-CM | POA: Diagnosis present

## 2021-11-22 DIAGNOSIS — K219 Gastro-esophageal reflux disease without esophagitis: Secondary | ICD-10-CM | POA: Diagnosis present

## 2021-11-22 DIAGNOSIS — N179 Acute kidney failure, unspecified: Secondary | ICD-10-CM | POA: Diagnosis present

## 2021-11-22 DIAGNOSIS — R638 Other symptoms and signs concerning food and fluid intake: Secondary | ICD-10-CM | POA: Diagnosis not present

## 2021-11-22 DIAGNOSIS — E860 Dehydration: Secondary | ICD-10-CM | POA: Diagnosis present

## 2021-11-22 DIAGNOSIS — R7989 Other specified abnormal findings of blood chemistry: Secondary | ICD-10-CM | POA: Diagnosis not present

## 2021-11-22 DIAGNOSIS — E8729 Other acidosis: Secondary | ICD-10-CM | POA: Diagnosis not present

## 2021-11-22 DIAGNOSIS — Z823 Family history of stroke: Secondary | ICD-10-CM

## 2021-11-22 DIAGNOSIS — Z711 Person with feared health complaint in whom no diagnosis is made: Secondary | ICD-10-CM | POA: Diagnosis not present

## 2021-11-22 DIAGNOSIS — Z66 Do not resuscitate: Secondary | ICD-10-CM | POA: Diagnosis present

## 2021-11-22 DIAGNOSIS — Z79890 Hormone replacement therapy (postmenopausal): Secondary | ICD-10-CM

## 2021-11-22 DIAGNOSIS — E039 Hypothyroidism, unspecified: Secondary | ICD-10-CM

## 2021-11-22 DIAGNOSIS — F0390 Unspecified dementia without behavioral disturbance: Secondary | ICD-10-CM | POA: Diagnosis not present

## 2021-11-22 LAB — I-STAT VENOUS BLOOD GAS, ED
Acid-base deficit: 10 mmol/L — ABNORMAL HIGH (ref 0.0–2.0)
Bicarbonate: 13.9 mmol/L — ABNORMAL LOW (ref 20.0–28.0)
Calcium, Ion: 1.01 mmol/L — ABNORMAL LOW (ref 1.15–1.40)
HCT: 45 % (ref 36.0–46.0)
Hemoglobin: 15.3 g/dL — ABNORMAL HIGH (ref 12.0–15.0)
O2 Saturation: 97 %
Potassium: 3.7 mmol/L (ref 3.5–5.1)
Sodium: 139 mmol/L (ref 135–145)
TCO2: 15 mmol/L — ABNORMAL LOW (ref 22–32)
pCO2, Ven: 27.2 mmHg — ABNORMAL LOW (ref 44.0–60.0)
pH, Ven: 7.317 (ref 7.250–7.430)
pO2, Ven: 100 mmHg — ABNORMAL HIGH (ref 32.0–45.0)

## 2021-11-22 LAB — CBC WITH DIFFERENTIAL/PLATELET
Abs Immature Granulocytes: 0.07 10*3/uL (ref 0.00–0.07)
Basophils Absolute: 0 10*3/uL (ref 0.0–0.1)
Basophils Relative: 0 %
Eosinophils Absolute: 0 10*3/uL (ref 0.0–0.5)
Eosinophils Relative: 0 %
HCT: 46.1 % — ABNORMAL HIGH (ref 36.0–46.0)
Hemoglobin: 14.8 g/dL (ref 12.0–15.0)
Immature Granulocytes: 1 %
Lymphocytes Relative: 14 %
Lymphs Abs: 1.4 10*3/uL (ref 0.7–4.0)
MCH: 31.1 pg (ref 26.0–34.0)
MCHC: 32.1 g/dL (ref 30.0–36.0)
MCV: 96.8 fL (ref 80.0–100.0)
Monocytes Absolute: 0.8 10*3/uL (ref 0.1–1.0)
Monocytes Relative: 8 %
Neutro Abs: 7.8 10*3/uL — ABNORMAL HIGH (ref 1.7–7.7)
Neutrophils Relative %: 77 %
Platelets: 312 10*3/uL (ref 150–400)
RBC: 4.76 MIL/uL (ref 3.87–5.11)
RDW: 19.9 % — ABNORMAL HIGH (ref 11.5–15.5)
WBC: 10.2 10*3/uL (ref 4.0–10.5)
nRBC: 0.2 % (ref 0.0–0.2)

## 2021-11-22 LAB — URINALYSIS, ROUTINE W REFLEX MICROSCOPIC
Glucose, UA: NEGATIVE mg/dL
Ketones, ur: 20 mg/dL — AB
Nitrite: NEGATIVE
Protein, ur: 30 mg/dL — AB
Specific Gravity, Urine: 1.02 (ref 1.005–1.030)
WBC, UA: >50 WBC/hpf — ABNORMAL HIGH (ref 0–5)
pH: 5 (ref 5.0–8.0)

## 2021-11-22 LAB — COMPREHENSIVE METABOLIC PANEL
ALT: 21 U/L (ref 0–44)
AST: 26 U/L (ref 15–41)
Albumin: 2.8 g/dL — ABNORMAL LOW (ref 3.5–5.0)
Alkaline Phosphatase: 238 U/L — ABNORMAL HIGH (ref 38–126)
Anion gap: 23 — ABNORMAL HIGH (ref 5–15)
BUN: 30 mg/dL — ABNORMAL HIGH (ref 8–23)
CO2: 11 mmol/L — ABNORMAL LOW (ref 22–32)
Calcium: 8.5 mg/dL — ABNORMAL LOW (ref 8.9–10.3)
Chloride: 105 mmol/L (ref 98–111)
Creatinine, Ser: 1.99 mg/dL — ABNORMAL HIGH (ref 0.44–1.00)
GFR, Estimated: 25 mL/min — ABNORMAL LOW (ref >60)
Glucose, Bld: 96 mg/dL (ref 70–99)
Potassium: 3.8 mmol/L (ref 3.5–5.1)
Sodium: 139 mmol/L (ref 135–145)
Total Bilirubin: 1.2 mg/dL (ref 0.3–1.2)
Total Protein: 6.2 g/dL — ABNORMAL LOW (ref 6.5–8.1)

## 2021-11-22 LAB — LACTIC ACID, PLASMA
Lactic Acid, Venous: 2.6 mmol/L (ref 0.5–1.9)
Lactic Acid, Venous: 2.4 mmol/L (ref 0.5–1.9)

## 2021-11-22 LAB — RESP PANEL BY RT-PCR (FLU A&B, COVID) ARPGX2
Influenza A by PCR: NEGATIVE
Influenza B by PCR: NEGATIVE
SARS Coronavirus 2 by RT PCR: NEGATIVE

## 2021-11-22 LAB — ETHANOL: Alcohol, Ethyl (B): <10 mg/dL (ref <10)

## 2021-11-22 LAB — CBG MONITORING, ED: Glucose-Capillary: 93 mg/dL (ref 70–99)

## 2021-11-22 LAB — AMMONIA: Ammonia: <10 umol/L (ref 9–35)

## 2021-11-22 MED ORDER — SODIUM CHLORIDE 0.9 % IV BOLUS
1000.0000 mL | Freq: Once | INTRAVENOUS | Status: AC
  Administered 2021-11-22: 1000 mL via INTRAVENOUS

## 2021-11-22 MED ORDER — ONDANSETRON HCL 4 MG PO TABS: 4.0000 mg | ORAL_TABLET | Freq: Four times a day (QID) | ORAL | Status: DC | PRN

## 2021-11-22 MED ORDER — LEVOTHYROXINE SODIUM 50 MCG PO TABS
50.0000 ug | ORAL_TABLET | Freq: Every day | ORAL | Status: DC
  Administered 2021-11-23: 06:00:00 50 ug via ORAL
  Filled 2021-11-22 (×2): qty 1

## 2021-11-22 MED ORDER — SODIUM CHLORIDE 0.9 % IV SOLN
1.0000 g | Freq: Once | INTRAVENOUS | Status: AC
  Administered 2021-11-22: 1 g via INTRAVENOUS
  Filled 2021-11-22: qty 10

## 2021-11-22 MED ORDER — ALPRAZOLAM ER 0.5 MG PO TB24: 0.5000 mg | ORAL_TABLET | Freq: Every day | ORAL | Status: DC | PRN

## 2021-11-22 MED ORDER — SODIUM CHLORIDE 0.9 % IV SOLN
1.0000 g | INTRAVENOUS | Status: DC
  Filled 2021-11-22: qty 10

## 2021-11-22 MED ORDER — FLUOXETINE HCL 20 MG PO CAPS
40.0000 mg | ORAL_CAPSULE | Freq: Every day | ORAL | Status: DC
  Administered 2021-11-23 – 2021-11-25 (×2): 40 mg via ORAL
  Filled 2021-11-22 (×4): qty 2

## 2021-11-22 MED ORDER — POLYVINYL ALCOHOL 1.4 % OP SOLN: 1.0000 [drp] | OPHTHALMIC | Status: DC | PRN

## 2021-11-22 MED ORDER — PRIMIDONE 50 MG PO TABS
50.0000 mg | ORAL_TABLET | Freq: Two times a day (BID) | ORAL | Status: DC
Start: 2021-11-22 — End: 2021-11-22

## 2021-11-22 MED ORDER — SODIUM CHLORIDE 0.9 % IV SOLN: INTRAVENOUS | Status: DC

## 2021-11-22 MED ORDER — SODIUM CHLORIDE 0.9% FLUSH
3.0000 mL | Freq: Two times a day (BID) | INTRAVENOUS | Status: DC
  Administered 2021-11-22 – 2021-11-25 (×5): 3 mL via INTRAVENOUS

## 2021-11-22 MED ORDER — BUPROPION HCL ER (XL) 150 MG PO TB24
150.0000 mg | ORAL_TABLET | Freq: Every day | ORAL | Status: DC
  Administered 2021-11-23 – 2021-11-25 (×2): 150 mg via ORAL
  Filled 2021-11-22 (×3): qty 1

## 2021-11-22 MED ORDER — PANTOPRAZOLE SODIUM 40 MG PO TBEC: 40.0000 mg | DELAYED_RELEASE_TABLET | Freq: Every day | ORAL | Status: DC

## 2021-11-22 MED ORDER — PROPRANOLOL HCL 20 MG PO TABS
10.0000 mg | ORAL_TABLET | Freq: Two times a day (BID) | ORAL | Status: DC
  Administered 2021-11-22 – 2021-11-25 (×5): 10 mg via ORAL
  Filled 2021-11-22 (×8): qty 1

## 2021-11-22 MED ORDER — PRIMIDONE 50 MG PO TABS
100.0000 mg | ORAL_TABLET | Freq: Every morning | ORAL | Status: DC
  Filled 2021-11-22: qty 2

## 2021-11-22 MED ORDER — ACETAMINOPHEN 325 MG PO TABS
650.0000 mg | ORAL_TABLET | Freq: Four times a day (QID) | ORAL | Status: DC | PRN
  Administered 2021-11-23: 14:00:00 650 mg via ORAL
  Filled 2021-11-22: qty 2

## 2021-11-22 MED ORDER — DONEPEZIL HCL 10 MG PO TABS
10.0000 mg | ORAL_TABLET | Freq: Every day | ORAL | Status: DC
  Administered 2021-11-23: 10:00:00 10 mg via ORAL
  Filled 2021-11-22 (×3): qty 1

## 2021-11-22 MED ORDER — ONDANSETRON HCL 4 MG/2ML IJ SOLN: 4.0000 mg | Freq: Four times a day (QID) | INTRAMUSCULAR | Status: DC | PRN

## 2021-11-22 MED ORDER — ACETAMINOPHEN 650 MG RE SUPP: 650.0000 mg | Freq: Four times a day (QID) | RECTAL | Status: DC | PRN

## 2021-11-22 MED ORDER — PRIMIDONE 50 MG PO TABS
50.0000 mg | ORAL_TABLET | Freq: Every day | ORAL | Status: DC
  Administered 2021-11-22: 50 mg via ORAL
  Filled 2021-11-22: qty 1

## 2021-11-22 MED ORDER — ATORVASTATIN CALCIUM 10 MG PO TABS
20.0000 mg | ORAL_TABLET | Freq: Every day | ORAL | Status: DC
  Administered 2021-11-23: 10:00:00 20 mg via ORAL
  Filled 2021-11-22 (×2): qty 2

## 2021-11-22 MED ORDER — MEMANTINE HCL 10 MG PO TABS
10.0000 mg | ORAL_TABLET | Freq: Two times a day (BID) | ORAL | Status: DC
  Administered 2021-11-22 – 2021-11-23 (×2): 10 mg via ORAL
  Filled 2021-11-22 (×6): qty 1

## 2021-11-22 MED ORDER — APIXABAN 5 MG PO TABS
5.0000 mg | ORAL_TABLET | Freq: Two times a day (BID) | ORAL | Status: DC
  Administered 2021-11-22 – 2021-11-23 (×3): 5 mg via ORAL
  Filled 2021-11-22 (×4): qty 1

## 2021-11-22 NOTE — ED Notes (Signed)
Pt transported to CT ?

## 2021-11-22 NOTE — ED Triage Notes (Signed)
Pt BIB [facility]. Pt has dementia at baseline, issues communicating, nonambulatory at baseline. Husband called out for having issues waking her up. Fell yesterday, unsure if she hit her head. On eliquis.   EMS VS- 136/73, HR 96, 91% RA-- 96% on 2L, RR 17

## 2021-11-22 NOTE — H&P (Incomplete)
History and Physical    Nicole Ruiz TMY:[id number] DOB: [birth year] DOA: 11/22/2021  Referring MD/NP/PA: Deno Etienne, DO PCP: Bernerd Limbo, MD  Patient coming from: Home  Chief Complaint: Fall  I have personally briefly reviewed patient's old medical records in [place]   HPI: Nicole Ruiz is a 80 y.o. female with medical history significant of CVA w/ residual aphasia, hypertension, hyperlipidemia, CKD, and dementia  Patient had just recently been hospitalized last month with Pseudomonas urinary tract infection.  In route with EMS patient was noted to have febrile with O2 saturations 91% on room air with improvement on 2 L of nasal cannula oxygen to 96%.  ED Course: Upon admission into the emergency department patient was seen to be febrile up to 100.6 F and all other vital signs maintained.  CT of the head and cervical spine did not note any acute abnormality labs significant for hemoglobin 15.3, BUN 30, creatinine 1.99, alkaline phosphatase 238, and lactic acid 2.6.  Venous pH was within normal limits with CO2 27.2.  Chest and pelvis x-ray did not note any acute abnormality.  Nursing had attempted that in and out cath but obtain nothing.  Patient had been given 1 L of normal saline IV fluids and started on empiric antibiotics of Rocephin.   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[place])    Chronic kidney disease    Depression    Hyperlipidemia    Hypertension    Stroke [facility])     Past Surgical History:  Procedure Laterality Date   ABDOMINAL HYSTERECTOMY     ARM SURGERY     BIOPSY  10/09/2021   Procedure: BIOPSY;  Surgeon: Irene Shipper, MD;  Location: [facility] ENDOSCOPY;  Service: Endoscopy;;   CARDIAC CATHETERIZATION  2013   negative   EP IMPLANTABLE DEVICE N/A 10/17/2015   Procedure: Loop Recorder Insertion;  Surgeon: Will Meredith Leeds, MD;  Location: [place]  CV LAB;  Service: Cardiovascular;  Laterality: N/A;   ESOPHAGOGASTRODUODENOSCOPY (EGD) WITH PROPOFOL N/A 10/09/2021   Procedure: ESOPHAGOGASTRODUODENOSCOPY (EGD) WITH PROPOFOL;  Surgeon: Irene Shipper, MD;  Location: [facility] ENDOSCOPY;  Service: Endoscopy;  Laterality: N/A;   EYE SURGERY     FEMUR IM NAIL Right 10/20/2015   Procedure: INTRAMEDULLARY (IM) RETROGRADE FEMORAL NAILING;  Surgeon: Tania Ade, MD;  Location: [place];  Service: Orthopedics;  Laterality: Right;   HEMORROIDECTOMY     KNEE SURGERY     TEE WITHOUT CARDIOVERSION N/A 10/17/2015   Procedure: TRANSESOPHAGEAL ECHOCARDIOGRAM (TEE);  Surgeon: Jerline Pain, MD;  Location: [facility] ENDOSCOPY;  Service: Cardiovascular;  Laterality: N/A;     reports that she has never smoked. She has never used smokeless tobacco. She reports that she does not drink alcohol and does not use drugs.  No Known Allergies  Family History  Problem Relation Age of Onset   Heart disease Mother 47   Stroke Father    Heart disease Brother 86    Prior to Admission medications   Medication Sig Start Date End Date Taking? Authorizing Provider  ALPRAZolam (XANAX XR) 0.5 MG 24 hr tablet Take 1 tablet (0.5 mg total) by mouth daily. 10/16/21   Arrien, Jimmy Picket, MD  apixaban (ELIQUIS) 5 MG TABS tablet TAKE 1 TABLET (5 MG TOTAL) BY MOUTH TWO TIMES DAILY. 03/06/21 03/06/22  Nita Sells, MD  atorvastatin (LIPITOR) 20 MG tablet Take 20  mg by mouth daily. 08/16/18   [provider]  buPROPion (WELLBUTRIN XL) 150 MG 24 hr tablet Take by mouth. 06/23/18   [provider]  Cholecalciferol (VITAMIN D3) 1000 units CAPS Take by mouth.    [provider]  donepezil (ARICEPT) 10 MG tablet Take 10 mg by mouth daily. 12/24/20   [provider]  FLUoxetine (PROZAC) 40 MG capsule Take 1 capsule (40 mg total) by mouth daily. 05/17/18 05/17/19  Frann Rider, NP  levothyroxine (SYNTHROID, LEVOTHROID) 50 MCG tablet  07/27/18    [provider]  memantine (NAMENDA) 10 MG tablet Take 10 mg by mouth 2 (two) times daily. 12/11/20   [provider]  Omega-3 Fatty Acids (FISH OIL) 1000 MG CAPS Take by mouth.    [provider]  pantoprazole (PROTONIX) 40 MG tablet Take 1 tablet (40 mg total) by mouth 2 (two) times daily for 15 days. After 15 days, will change to daily. 10/16/21 10/31/21  Arrien, Jimmy Picket, MD  polyvinyl alcohol (LIQUIFILM TEARS) 1.4 % ophthalmic solution Place 1 drop into both eyes every hour as needed for dry eyes.    [provider]  primidone (MYSOLINE) 50 MG tablet Take 50 mg by mouth 2 (two) times daily. Take 2 tablets in the morning and 1 tablet in the evening.    [provider]  propranolol (INDERAL) 10 MG tablet Take 1 tablet (10 mg total) by mouth 2 (two) times daily. 03/06/21   Nita Sells, MD  UNABLE TO FIND Mega red take one daily    [provider]  Vitamin D, Ergocalciferol, (DRISDOL) 1.25 MG (50000 UNIT) CAPS capsule Take 50,000 Units by mouth every 14 (fourteen) days. 01/07/21   [provider]    Physical Exam:  Constitutional: NAD, calm, comfortable Vitals:   11/22/21 1030 11/22/21 1045 11/22/21 1100 11/22/21 1115  BP: 126/76 120/67 (!) 125/59 122/70  Pulse: 89 89 92 91  Resp: _0 Temp:      TempSrc:      SpO2: 97% 97% 95% 96%   Eyes: PERRL, lids and conjunctivae normal ENMT: Mucous membranes are moist. Posterior pharynx clear of any exudate or lesions.Normal dentition.  Neck: normal, supple, no masses, no thyromegaly Respiratory: clear to auscultation bilaterally, no wheezing, no crackles. Normal respiratory effort. No accessory muscle use.  Cardiovascular: Regular rate and rhythm, no murmurs / rubs / gallops. No extremity edema. 2+ pedal pulses. No carotid bruits.  Abdomen: no tenderness, no masses palpated. No hepatosplenomegaly. Bowel sounds positive.  Musculoskeletal: no clubbing / cyanosis. No  joint deformity upper and lower extremities. Good ROM, no contractures. Normal muscle tone.  Skin: no rashes, lesions, ulcers. No induration Neurologic: CN 2-12 grossly intact. Sensation intact, DTR normal. Strength 5/5 in all 4.  Psychiatric: Normal judgment and insight. Alert and oriented x 3. Normal mood.     Labs on Admission: I have personally reviewed following labs and imaging studies  CBC: Recent Labs  Lab 11/22/21 0833 11/22/21 0914  WBC 10.2  --   NEUTROABS 7.8*  --   HGB 14.8 15.3*  HCT 46.1* 45.0  MCV 96.8  --   PLT 312  --    Basic Metabolic Panel: Recent Labs  Lab 11/22/21 0833 11/22/21 0914  NA 139 139  K 3.8 3.7  CL 105  --   CO2 11*  --   GLUCOSE 96  --   BUN 30*  --   CREATININE 1.99*  --  CALCIUM 8.5*  --    GFR: CrCl cannot be calculated (Unknown ideal weight.). Liver Function Tests: Recent Labs  Lab 11/22/21 0833  AST 26  ALT 21  ALKPHOS 238*  BILITOT 1.2  PROT 6.2*  ALBUMIN 2.8*   No results for input(s): LIPASE, AMYLASE in the last 168 hours. Recent Labs  Lab 11/22/21 0833  AMMONIA <10   Coagulation Profile: No results for input(s): INR, PROTIME in the last 168 hours. Cardiac Enzymes: No results for input(s): CKTOTAL, CKMB, CKMBINDEX, TROPONINI in the last 168 hours. BNP (last 3 results) No results for input(s): PROBNP in the last 8760 hours. HbA1C: No results for input(s): HGBA1C in the last 72 hours. CBG: Recent Labs  Lab 11/22/21 0857  GLUCAP 93   Lipid Profile: No results for input(s): CHOL, HDL, LDLCALC, TRIG, CHOLHDL, LDLDIRECT in the last 72 hours. Thyroid Function Tests: No results for input(s): TSH, T4TOTAL, FREET4, T3FREE, THYROIDAB in the last 72 hours. Anemia Panel: No results for input(s): VITAMINB12, FOLATE, FERRITIN, TIBC, IRON, RETICCTPCT in the last 72 hours. Urine analysis:    Component Value Date/Time   COLORURINE AMBER (A) 10/08/2021 2008   APPEARANCEUR CLOUDY (A) 10/08/2021 2008   LABSPEC 1.014  10/08/2021 2008   PHURINE 6.0 10/08/2021 2008   GLUCOSEU NEGATIVE 10/08/2021 2008   HGBUR LARGE (A) 10/08/2021 2008   BILIRUBINUR NEGATIVE 10/08/2021 2008   KETONESUR 20 (A) 10/08/2021 2008   PROTEINUR 100 (A) 10/08/2021 2008   UROBILINOGEN 1.0 10/20/2015 1124   NITRITE NEGATIVE 10/08/2021 2008   LEUKOCYTESUR NEGATIVE 10/08/2021 2008   Sepsis Labs: Recent Results (from the past 240 hour(s))  Resp Panel by RT-PCR (Flu A&B, Covid) Nasopharyngeal Swab     Status: None   Collection Time: 11/22/21 10:07 AM   Specimen: Nasopharyngeal Swab; Nasopharyngeal(NP) swabs in vial transport medium  Result Value Ref Range Status   SARS Coronavirus 2 by RT PCR NEGATIVE NEGATIVE Final    Comment: (NOTE) SARS-CoV-2 target nucleic acids are NOT DETECTED.  The SARS-CoV-2 RNA is generally detectable in upper respiratory specimens during the acute phase of infection. The lowest concentration of SARS-CoV-2 viral copies this assay can detect is 138 copies/mL. A negative result does not preclude SARS-Cov-2 infection and should not be used as the sole basis for treatment or other patient management decisions. A negative result may occur with  improper specimen collection/handling, submission of specimen other than nasopharyngeal swab, presence of viral mutation(s) within the areas targeted by this assay, and inadequate number of viral copies(<138 copies/mL). A negative result must be combined with clinical observations, patient history, and epidemiological information. The expected result is Negative.  Fact Sheet for Patients:  EntrepreneurPulse.com.au  Fact Sheet for Healthcare Providers:  IncredibleEmployment.be  This test is no t yet approved or cleared by the [place] FDA and  has been authorized for detection and/or diagnosis of SARS-CoV-2 by FDA under an Emergency Use Authorization (EUA). This EUA will remain  in effect (meaning this test can be used) for  the duration of the COVID-19 declaration under Section 564(b)(1) of the Act, 21 U.S.C.section 360bbb-3(b)(1), unless the authorization is terminated  or revoked sooner.       Influenza A by PCR NEGATIVE NEGATIVE Final   Influenza B by PCR NEGATIVE NEGATIVE Final    Comment: (NOTE) The Xpert Xpress SARS-CoV-2/FLU/RSV plus assay is intended as an aid in the diagnosis of influenza from Nasopharyngeal swab specimens and should not be used as a sole basis for treatment. Nasal washings and  aspirates are unacceptable for Xpert Xpress SARS-CoV-2/FLU/RSV testing.  Fact Sheet for Patients: EntrepreneurPulse.com.au  Fact Sheet for Healthcare Providers: IncredibleEmployment.be  This test is not yet approved or cleared by the [place] FDA and has been authorized for detection and/or diagnosis of SARS-CoV-2 by FDA under an Emergency Use Authorization (EUA). This EUA will remain in effect (meaning this test can be used) for the duration of the COVID-19 declaration under Section 564(b)(1) of the Act, 21 U.S.C. section 360bbb-3(b)(1), unless the authorization is terminated or revoked.  Performed at [facility], [place] [address] [place], [place] [address]      Radiological Exams on Admission: CT HEAD WO CONTRAST  Result Date: 11/22/2021 CLINICAL DATA:  80 year old female with history of trauma from a fall yesterday. Dementia. EXAM: CT HEAD WITHOUT CONTRAST CT CERVICAL SPINE WITHOUT CONTRAST TECHNIQUE: Multidetector CT imaging of the head and cervical spine was performed following the standard protocol without intravenous contrast. Multiplanar CT image reconstructions of the cervical spine were also generated. COMPARISON:  CT of the head and cervical spine November 04, 2021. FINDINGS: CT HEAD FINDINGS Brain: Moderate cerebral atrophy. Patchy and confluent areas of decreased attenuation are noted throughout the deep and periventricular white matter of  the cerebral hemispheres bilaterally, compatible with chronic microvascular ischemic disease. Well-defined focus of low attenuation in the left cerebellar hemisphere, compatible with an old infarct. No evidence of acute infarction, hemorrhage, hydrocephalus, extra-axial collection or mass lesion/mass effect. Vascular: No hyperdense vessel or unexpected calcification. Skull: Normal. Negative for fracture or focal lesion. Sinuses/Orbits: No acute finding. Other: None. CT CERVICAL SPINE FINDINGS Alignment: Normal. Skull base and vertebrae: No acute fracture. No primary bone lesion or focal pathologic process. Soft tissues and spinal canal: No prevertebral fluid or swelling. No visible canal hematoma. Disc levels: Multilevel degenerative disc disease, most pronounced at C4-C5, C5-C6 and C6-C7. Moderate multilevel facet arthropathy. Upper chest: Negative. Other: None. IMPRESSION: 1. No evidence of significant acute traumatic injury to the skull, brain or cervical spine. 2. Moderate cerebral atrophy with chronic microvascular ischemic changes in the cerebral white matter and old left cerebellar infarct, as above. 3. Multilevel degenerative disc disease and cervical spondylosis, as above. Electronically Signed   By: Vinnie Langton M.D.   On: 11/22/2021 10:17   CT Cervical Spine Wo Contrast  Result Date: 11/22/2021 CLINICAL DATA:  80 year old female with history of trauma from a fall yesterday. Dementia. EXAM: CT HEAD WITHOUT CONTRAST CT CERVICAL SPINE WITHOUT CONTRAST TECHNIQUE: Multidetector CT imaging of the head and cervical spine was performed following the standard protocol without intravenous contrast. Multiplanar CT image reconstructions of the cervical spine were also generated. COMPARISON:  CT of the head and cervical spine November 04, 2021. FINDINGS: CT HEAD FINDINGS Brain: Moderate cerebral atrophy. Patchy and confluent areas of decreased attenuation are noted throughout the deep and periventricular white  matter of the cerebral hemispheres bilaterally, compatible with chronic microvascular ischemic disease. Well-defined focus of low attenuation in the left cerebellar hemisphere, compatible with an old infarct. No evidence of acute infarction, hemorrhage, hydrocephalus, extra-axial collection or mass lesion/mass effect. Vascular: No hyperdense vessel or unexpected calcification. Skull: Normal. Negative for fracture or focal lesion. Sinuses/Orbits: No acute finding. Other: None. CT CERVICAL SPINE FINDINGS Alignment: Normal. Skull base and vertebrae: No acute fracture. No primary bone lesion or focal pathologic process. Soft tissues and spinal canal: No prevertebral fluid or swelling. No visible canal hematoma. Disc levels: Multilevel degenerative disc disease, most pronounced at C4-C5, C5-C6 and C6-C7. Moderate  multilevel facet arthropathy. Upper chest: Negative. Other: None. IMPRESSION: 1. No evidence of significant acute traumatic injury to the skull, brain or cervical spine. 2. Moderate cerebral atrophy with chronic microvascular ischemic changes in the cerebral white matter and old left cerebellar infarct, as above. 3. Multilevel degenerative disc disease and cervical spondylosis, as above. Electronically Signed   By: Vinnie Langton M.D.   On: 11/22/2021 10:17   DG Pelvis Portable  Result Date: 11/22/2021 CLINICAL DATA:  Altered mental status EXAM: PORTABLE PELVIS 1-2 VIEWS COMPARISON:  10/08/2021 CT FINDINGS: There is no evidence of pelvic fracture or diastasis. No pelvic bone lesions are seen. Generalized osteopenia. Right femoral nail IMPRESSION: No acute finding. Electronically Signed   By: Jorje Guild M.D.   On: 11/22/2021 09:12   DG Chest Portable 1 View  Result Date: 11/22/2021 CLINICAL DATA:  80 year old female with history of altered mental status. Dementia at baseline. EXAM: PORTABLE CHEST 1 VIEW COMPARISON:  Chest x-ray 11/04/2021. FINDINGS: Lung volumes are normal. No consolidative  airspace disease. No pleural effusions. No pneumothorax. No pulmonary nodule or mass noted. Pulmonary vasculature and the cardiomediastinal silhouette are within normal limits. Atherosclerosis in the thoracic aorta. Electronic device projecting over the lower left hemithorax, likely an implantable loop recorder. IMPRESSION: 1.  No radiographic evidence of acute cardiopulmonary disease. 2. Aortic atherosclerosis. Electronically Signed   By: Vinnie Langton M.D.   On: 11/22/2021 08:58    EKG: Independently reviewed.  Sinus rhythm at 99bpm  Assessment/Plan Fever secondary to suspected UTI: Upon admission into the emergency department patient was noted to be febrile up to 100.6 F.  No other SIRS criteria met.  Chest x-ray otherwise noted to be clear. -Admit to medical telemetry bed -Follow-up urinalysis -Tylenol as needed for fever  Lactic acidosis: Acute.  Lactic acid was elevated at 2.6.  Suspect secondary to dehydration and/or infection. -Continue to trend lactic acid  Acute kidney injury: Patient presents with creatinine elevated up to 1.99 with BUN 30.  Baseline creatinine previously had been less than 1.  He had received 1 L normal saline IV fluids. -Follow-up urinalysis -Continue normal saline IV fluids at 100 mL/h  Dementia: -Continue  History of CVA with residual deficit:  Protein calorie malnutrition: Chronic.  Albumin 2.8. -Check prealbumin  DVT prophylaxis: Eliquis Code Status: Full Family Communication: ***  Disposition Plan: To be determined Consults called: None Admission status: Inpatient require more than 2 midnight stay for need IV fluid rehydration  Norval Morton MD [facility]   If 7PM-7AM, please contact night-coverage   11/22/2021, 11:40 AM

## 2021-11-22 NOTE — ED Notes (Signed)
Refused dinner. Refused meds. Attempted applesauce. Pt continues to refuse and would not open her mouth. Will continue to monitor.

## 2021-11-22 NOTE — ED Provider Notes (Signed)
[facility] EMERGENCY DEPARTMENT Provider Note   CSN: [id number] Arrival date & time: 11/22/21  0825     History Chief Complaint  Patient presents with   Weakness    ANELISSE JACOBSON is a 80 y.o. female.  80 yo F with a cc of ams.  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  Entire hx per EMS report. Hard to wake up this morning per family.  Reportedly normal yesterday.  Level V caveat non verbal.    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[place])    Chronic kidney disease    Depression    Hyperlipidemia    Hypertension    Stroke [facility])     Patient Active Problem List   Diagnosis Date Noted   Fever 11/22/2021   Acute gastric ulcer without hemorrhage or perforation    Esophageal ring    Intramuscular hematoma    Anemia 10/08/2021   Nausea and vomiting 10/08/2021   Hematuria 10/08/2021   Dehydration    Oropharyngeal dysphagia    Failure to thrive (child)    UTI (urinary tract infection) 10/02/2021   AKI (acute kidney injury) ([place]) 10/01/2021   Pulmonary embolism, bilateral ([place]) 03/26/2021   Hypothyroid 03/04/2021   Acute respiratory failure ([place]) 03/01/2021   At risk for falling 12/20/2015   Cerebrovascular accident, 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   Femur fracture, right ([place]) 10/20/2015   Fracture of distal femur, right, closed, with routine healing, subsequent encounter 10/20/2015   Fracture of distal end of femur ([place]) 10/20/2015   Fall 10/19/2015   Dyslipidemia 10/19/2015   Nodule of right lung 10/19/2015   Lung mass 10/19/2015   CVA (cerebral infarction) 10/15/2015   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   Hyperlipidemia 10/15/2015   Stroke (cerebrum) ([place]) 10/15/2015   Cerebral infarction ([place]) 10/15/2015   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   Cerebrovascular accident (CVA) ([place]) 10/15/2015   Episode of syncope 07/17/2015   Arthralgia of hip 07/09/2015    Idiopathic insomnia 07/09/2015   Arthralgia of lower leg 04/06/2014   Unspecified arthropathy, lower leg 12/18/2013   Acute posthemorrhagic anemia 12/18/2013   Esophageal reflux 12/18/2013   Acid reflux 12/18/2013   Abnormal finding on radiology exam 09/19/2013   Acquired cyst of kidney 09/19/2013   CKD (chronic kidney disease), stage III ([place]) 09/19/2013   Clinical depression 09/19/2013   Bone/cartilage disorder 09/19/2013   Arthritis of knee, degenerative 09/19/2013   Avitaminosis D 09/19/2013   Hypertension 05/29/2012   Closed fracture of metatarsal bone 12/18/2011   HYPERLIPIDEMIA 03/06/2009   DIZZINESS 03/06/2009   Palpitations 03/06/2009   CHEST PAIN 03/06/2009    Past Surgical History:  Procedure Laterality Date   ABDOMINAL HYSTERECTOMY     ARM SURGERY     BIOPSY  10/09/2021   Procedure: BIOPSY;  Surgeon: Irene Shipper, MD;  Location: [facility] ENDOSCOPY;  Service: Endoscopy;;   CARDIAC CATHETERIZATION  2013   negative   EP IMPLANTABLE DEVICE N/A 10/17/2015   Procedure: Loop Recorder Insertion;  Surgeon: Will Meredith Leeds, MD;  Location: [place] CV LAB;  Service: Cardiovascular;  Laterality: N/A;   ESOPHAGOGASTRODUODENOSCOPY (EGD) WITH PROPOFOL N/A 10/09/2021   Procedure: ESOPHAGOGASTRODUODENOSCOPY (EGD) WITH PROPOFOL;  Surgeon: Irene Shipper, MD;  Location: [facility] ENDOSCOPY;  Service: Endoscopy;  Laterality: N/A;   EYE SURGERY     FEMUR IM NAIL Right 10/20/2015   Procedure: INTRAMEDULLARY (IM) RETROGRADE FEMORAL  NAILING;  Surgeon: Tania Ade, MD;  Location: [place];  Service: Orthopedics;  Laterality: Right;   HEMORROIDECTOMY     KNEE SURGERY     TEE WITHOUT CARDIOVERSION N/A 10/17/2015   Procedure: TRANSESOPHAGEAL ECHOCARDIOGRAM (TEE);  Surgeon: Jerline Pain, MD;  Location: [facility] ENDOSCOPY;  Service: Cardiovascular;  Laterality: N/A;     OB History   No obstetric history on file.     Family History  Problem Relation Age of Onset   Heart disease Mother 11    Stroke Father    Heart disease Brother 64    Social History   Tobacco Use   Smoking status: Never   Smokeless tobacco: Never  Substance Use Topics   Alcohol use: No   Drug use: No    Home Medications Prior to Admission medications   Medication Sig Start Date End Date Taking? Authorizing Provider  ALPRAZolam (XANAX XR) 0.5 MG 24 hr tablet Take 1 tablet (0.5 mg total) by mouth daily. 10/16/21   Arrien, Jimmy Picket, MD  apixaban (ELIQUIS) 5 MG TABS tablet TAKE 1 TABLET (5 MG TOTAL) BY MOUTH TWO TIMES DAILY. 03/06/21 03/06/22  Nita Sells, MD  atorvastatin (LIPITOR) 20 MG tablet Take 20 mg by mouth daily. 08/16/18   [provider]  buPROPion (WELLBUTRIN XL) 150 MG 24 hr tablet Take by mouth. 06/23/18   [provider]  Cholecalciferol (VITAMIN D3) 1000 units CAPS Take by mouth.    [provider]  donepezil (ARICEPT) 10 MG tablet Take 10 mg by mouth daily. 12/24/20   [provider]  FLUoxetine (PROZAC) 40 MG capsule Take 1 capsule (40 mg total) by mouth daily. 05/17/18 05/17/19  Frann Rider, NP  levothyroxine (SYNTHROID, LEVOTHROID) 50 MCG tablet  07/27/18   [provider]  memantine (NAMENDA) 10 MG tablet Take 10 mg by mouth 2 (two) times daily. 12/11/20   [provider]  Omega-3 Fatty Acids (FISH OIL) 1000 MG CAPS Take by mouth.    [provider]  pantoprazole (PROTONIX) 40 MG tablet Take 1 tablet (40 mg total) by mouth 2 (two) times daily for 15 days. After 15 days, will change to daily. 10/16/21 10/31/21  Arrien, Jimmy Picket, MD  polyvinyl alcohol (LIQUIFILM TEARS) 1.4 % ophthalmic solution Place 1 drop into both eyes every hour as needed for dry eyes.    [provider]  primidone (MYSOLINE) 50 MG tablet Take 50 mg by mouth 2 (two) times daily. Take 2 tablets in the morning and 1 tablet in the evening.    [provider]  propranolol (INDERAL) 10 MG tablet Take 1 tablet (10 mg total) by  mouth 2 (two) times daily. 03/06/21   Nita Sells, MD  UNABLE TO FIND Mega red take one daily    [provider]  Vitamin D, Ergocalciferol, (DRISDOL) 1.25 MG (50000 UNIT) CAPS capsule Take 50,000 Units by mouth every 14 (fourteen) days. 01/07/21   [provider]    Allergies    Patient has no known allergies.  Review of Systems   Review of Systems  Unable to perform ROS: Patient nonverbal  Neurological:  Positive for weakness.   Physical Exam Updated Vital Signs BP 132/61   Pulse 88   Temp (!) 100.6 F (38.1 C) (Rectal)   Resp 11   SpO2 97%   Physical Exam Vitals and nursing note reviewed.  Constitutional:      General: She is not in acute distress.    Appearance: She is  well-developed. She is not diaphoretic.     Comments: Opens eyes to voice  HENT:     Head: Normocephalic and atraumatic.  Eyes:     Pupils: Pupils are equal, round, and reactive to light.  Cardiovascular:     Rate and Rhythm: Normal rate and regular rhythm.     Heart sounds: No murmur heard.   No friction rub. No gallop.  Pulmonary:     Effort: Pulmonary effort is normal.     Breath sounds: No wheezing or rales.  Abdominal:     General: There is no distension.     Palpations: Abdomen is soft.     Tenderness: There is no abdominal tenderness.  Musculoskeletal:        General: No tenderness.     Cervical back: Normal range of motion and neck supple.     Comments: No obvious pain on palpation from head to toe.  RLE longer than left.   Skin:    General: Skin is warm and dry.     Capillary Refill: Cool extremities diffusely Psychiatric:        Behavior: Behavior normal.    ED Results / Procedures / Treatments   Labs (all labs ordered are listed, but only abnormal results are displayed) Labs Reviewed  COMPREHENSIVE METABOLIC PANEL - Abnormal; Notable for the following components:      Result Value   CO2 11 (*)    BUN 30 (*)    Creatinine, Ser 1.99 (*)    Calcium 8.5  (*)    Total Protein 6.2 (*)    Albumin 2.8 (*)    Alkaline Phosphatase 238 (*)    GFR, Estimated 25 (*)    Anion gap 23 (*)    All other components within normal limits  LACTIC ACID, PLASMA - Abnormal; Notable for the following components:   Lactic Acid, Venous 2.6 (*)    All other components within normal limits  CBC WITH DIFFERENTIAL/PLATELET - Abnormal; Notable for the following components:   HCT 46.1 (*)    RDW 19.9 (*)    Neutro Abs 7.8 (*)    All other components within normal limits  I-STAT VENOUS BLOOD GAS, ED - Abnormal; Notable for the following components:   pCO2, Ven 27.2 (*)    pO2, Ven 100.0 (*)    Bicarbonate 13.9 (*)    TCO2 15 (*)    Acid-base deficit 10.0 (*)    Calcium, Ion 1.01 (*)    Hemoglobin 15.3 (*)    All other components within normal limits  RESP PANEL BY RT-PCR (FLU A&B, COVID) ARPGX2  AMMONIA  ETHANOL  URINALYSIS, ROUTINE W REFLEX MICROSCOPIC  LACTIC ACID, PLASMA  LACTIC ACID, PLASMA  CBG MONITORING, ED    EKG EKG Interpretation  Date/Time:  Saturday November 22 2021 08:44:04 EST Ventricular Rate:  99 PR Interval:  114 QRS Duration: 80 QT Interval:  338 QTC Calculation: 434 R Axis:   -29 Text Interpretation: Sinus rhythm Inferior infarct, old Lateral leads are also involved No significant change since last tracing Confirmed by Deno Etienne [phone]) on 11/22/2021 8:45:42 AM  Radiology CT HEAD WO CONTRAST  Result Date: 11/22/2021 CLINICAL DATA:  80 year old female with history of trauma from a fall yesterday. Dementia. EXAM: CT HEAD WITHOUT CONTRAST CT CERVICAL SPINE WITHOUT CONTRAST TECHNIQUE: Multidetector CT imaging of the head and cervical spine was performed following the standard protocol without intravenous contrast. Multiplanar CT image reconstructions of the cervical spine were also generated. COMPARISON:  CT of the head and cervical spine November 04, 2021. FINDINGS: CT HEAD FINDINGS Brain: Moderate cerebral atrophy. Patchy and  confluent areas of decreased attenuation are noted throughout the deep and periventricular white matter of the cerebral hemispheres bilaterally, compatible with chronic microvascular ischemic disease. Well-defined focus of low attenuation in the left cerebellar hemisphere, compatible with an old infarct. No evidence of acute infarction, hemorrhage, hydrocephalus, extra-axial collection or mass lesion/mass effect. Vascular: No hyperdense vessel or unexpected calcification. Skull: Normal. Negative for fracture or focal lesion. Sinuses/Orbits: No acute finding. Other: None. CT CERVICAL SPINE FINDINGS Alignment: Normal. Skull base and vertebrae: No acute fracture. No primary bone lesion or focal pathologic process. Soft tissues and spinal canal: No prevertebral fluid or swelling. No visible canal hematoma. Disc levels: Multilevel degenerative disc disease, most pronounced at C4-C5, C5-C6 and C6-C7. Moderate multilevel facet arthropathy. Upper chest: Negative. Other: None. IMPRESSION: 1. No evidence of significant acute traumatic injury to the skull, brain or cervical spine. 2. Moderate cerebral atrophy with chronic microvascular ischemic changes in the cerebral white matter and old left cerebellar infarct, as above. 3. Multilevel degenerative disc disease and cervical spondylosis, as above. Electronically Signed   By: Vinnie Langton M.D.   On: 11/22/2021 10:17   CT Cervical Spine Wo Contrast  Result Date: 11/22/2021 CLINICAL DATA:  80 year old female with history of trauma from a fall yesterday. Dementia. EXAM: CT HEAD WITHOUT CONTRAST CT CERVICAL SPINE WITHOUT CONTRAST TECHNIQUE: Multidetector CT imaging of the head and cervical spine was performed following the standard protocol without intravenous contrast. Multiplanar CT image reconstructions of the cervical spine were also generated. COMPARISON:  CT of the head and cervical spine November 04, 2021. FINDINGS: CT HEAD FINDINGS Brain: Moderate cerebral atrophy.  Patchy and confluent areas of decreased attenuation are noted throughout the deep and periventricular white matter of the cerebral hemispheres bilaterally, compatible with chronic microvascular ischemic disease. Well-defined focus of low attenuation in the left cerebellar hemisphere, compatible with an old infarct. No evidence of acute infarction, hemorrhage, hydrocephalus, extra-axial collection or mass lesion/mass effect. Vascular: No hyperdense vessel or unexpected calcification. Skull: Normal. Negative for fracture or focal lesion. Sinuses/Orbits: No acute finding. Other: None. CT CERVICAL SPINE FINDINGS Alignment: Normal. Skull base and vertebrae: No acute fracture. No primary bone lesion or focal pathologic process. Soft tissues and spinal canal: No prevertebral fluid or swelling. No visible canal hematoma. Disc levels: Multilevel degenerative disc disease, most pronounced at C4-C5, C5-C6 and C6-C7. Moderate multilevel facet arthropathy. Upper chest: Negative. Other: None. IMPRESSION: 1. No evidence of significant acute traumatic injury to the skull, brain or cervical spine. 2. Moderate cerebral atrophy with chronic microvascular ischemic changes in the cerebral white matter and old left cerebellar infarct, as above. 3. Multilevel degenerative disc disease and cervical spondylosis, as above. Electronically Signed   By: Vinnie Langton M.D.   On: 11/22/2021 10:17   DG Pelvis Portable  Result Date: 11/22/2021 CLINICAL DATA:  Altered mental status EXAM: PORTABLE PELVIS 1-2 VIEWS COMPARISON:  10/08/2021 CT FINDINGS: There is no evidence of pelvic fracture or diastasis. No pelvic bone lesions are seen. Generalized osteopenia. Right femoral nail IMPRESSION: No acute finding. Electronically Signed   By: Jorje Guild M.D.   On: 11/22/2021 09:12   DG Chest Portable 1 View  Result Date: 11/22/2021 CLINICAL DATA:  80 year old female with history of altered mental status. Dementia at baseline. EXAM: PORTABLE  CHEST 1 VIEW COMPARISON:  Chest x-ray 11/04/2021. FINDINGS: Lung volumes are normal. No consolidative airspace  disease. No pleural effusions. No pneumothorax. No pulmonary nodule or mass noted. Pulmonary vasculature and the cardiomediastinal silhouette are within normal limits. Atherosclerosis in the thoracic aorta. Electronic device projecting over the lower left hemithorax, likely an implantable loop recorder. IMPRESSION: 1.  No radiographic evidence of acute cardiopulmonary disease. 2. Aortic atherosclerosis. Electronically Signed   By: Vinnie Langton M.D.   On: 11/22/2021 08:58    Procedures Procedures   Medications Ordered in ED Medications  sodium chloride flush (NS) 0.9 % injection 3 mL (has no administration in time range)  0.9 %  sodium chloride infusion (has no administration in time range)  acetaminophen (TYLENOL) tablet 650 mg (has no administration in time range)    Or  acetaminophen (TYLENOL) suppository 650 mg (has no administration in time range)  ondansetron (ZOFRAN) tablet 4 mg (has no administration in time range)    Or  ondansetron (ZOFRAN) injection 4 mg (has no administration in time range)  sodium chloride 0.9 % bolus 1,000 mL (1,000 mLs Intravenous New Bag/Given 11/22/21 1137)  cefTRIAXone (ROCEPHIN) 1 g in sodium chloride 0.9 % 100 mL IVPB (1 g Intravenous New Bag/Given 11/22/21 1135)    ED Course  I have reviewed the triage vital signs and the nursing notes.  Pertinent labs & imaging results that were available during my care of the patient were reviewed by me and considered in my medical decision making (see chart for details).    MDM Rules/Calculators/A&P                           80 yo F with a cc of AMS.  Reportedly hard to wake up this morning.  Fall yesterday no obvious reported injury.  Will obtain AMS workup, ct head cspine plain film of pelvis with hx of fall.   CT imaging is negative.  No hypercarbia.  Patient does have a fairly significant AKI  acidosis with anion gap.  Patient's lactate is mildly elevated.  I suspect the patient is profoundly dehydrated.  We will give IV fluids.  Patient's In-N-Out cath attempt after being in the ED for a couple hours was anuric.  We will discuss with medicine for admission.  The patients results and plan were reviewed and discussed.   Any x-rays performed were independently reviewed by myself.   Differential diagnosis were considered with the presenting HPI.  Medications  sodium chloride flush (NS) 0.9 % injection 3 mL (has no administration in time range)  0.9 %  sodium chloride infusion (has no administration in time range)  acetaminophen (TYLENOL) tablet 650 mg (has no administration in time range)    Or  acetaminophen (TYLENOL) suppository 650 mg (has no administration in time range)  ondansetron (ZOFRAN) tablet 4 mg (has no administration in time range)    Or  ondansetron (ZOFRAN) injection 4 mg (has no administration in time range)  sodium chloride 0.9 % bolus 1,000 mL (1,000 mLs Intravenous New Bag/Given 11/22/21 1137)  cefTRIAXone (ROCEPHIN) 1 g in sodium chloride 0.9 % 100 mL IVPB (1 g Intravenous New Bag/Given 11/22/21 1135)    Vitals:   11/22/21 1100 11/22/21 1115 11/22/21 1130 11/22/21 1145  BP: (!) 125/59 122/70 (!) 111/53 132/61  Pulse: 92 91 87 88  Resp: 11 12 12 11   Temp:      TempSrc:      SpO2: 95% 96% 96% 97%    Final diagnoses:  AKI (acute kidney injury) ([place])  Admission/ observation were discussed with the admitting physician, patient and/or family and they are comfortable with the plan.    Final Clinical Impression(s) / ED Diagnoses Final diagnoses:  AKI (acute kidney injury) [facility])    Rx / [place] Orders ED Discharge Orders     None        Deno Etienne, DO 11/22/21 1243

## 2021-11-22 NOTE — H&P (Signed)
At home History and Physical    Nicole Ruiz VOZ:[id number] DOB: 06-18-1941 DOA: 11/22/2021  Referring MD/NP/PA: Deno Etienne, DO PCP: Bernerd Limbo, MD  Patient coming from: Home(lives with husband)  Chief Complaint: unresponsive  I have personally briefly reviewed patient's old medical records in [place]   HPI: Nicole Ruiz is a 80 y.o. female with medical history significant of CVA, hypertension, hyperlipidemia, CKD, and dementia presents after  being found to be unresponsive this morning.  History is mostly obtained from the patient's husband present at bedside she lives with her husband who acts as her caregiver, but notes that he is 79 years old and cannot really pick her up like that.  He states that yesterday for the last 6 weeks or so she has not been really eating much of anything and cannot really walk.  She will take a few sips of water here and there.  The patient was just recently been hospitalized last month with Pseudomonas urinary tract infection, but her husband notes she presented to the hospital after being noted to be altered at home.  Records note that patient was found to have T1 and T2 endplate compression fractures without new and had difficulty swallowing for which she underwent EGD 10/13 which noted incidental large caliber distal esophageal ring and gastric ulcers for which she had been started on PPI.  Palliative care have been consulted and discussions have been been made in regards to PEG tube and artificial feeding, but husband was not in favor of PEG tube or artificial nutrition at that time although wanted her to remain a full code.  At home yesterday she had fell off the toilet seat while her husband was trying to pick her up.  He notes that she slipped out of his hands.  No reported injuries were sustained.  Over the last few days he reports that she is been talking like something stuck in her throat.  However, this morning he states that she just would  not wake up to take her morning meds and therefore he called EMS.  He states that he would like patient to remain a full code.  In route with EMS patient was noted to have febrile with O2 saturations 91% on room air with improvement on 2 L of nasal cannula oxygen to 96%.  ED Course: Upon admission into the emergency department patient was seen to be febrile up to 100.6 F and all other vital signs maintained.  CT of the head and cervical spine did not note any acute abnormality labs significant for hemoglobin 15.3, BUN 30, creatinine 1.99, alkaline phosphatase 238, and lactic acid 2.6.  Venous pH was within normal limits with CO2 27.2.  Chest and pelvis x-ray did not note any acute abnormality.  Nursing had attempted that in and out cath but obtain nothing.  Patient had been given 1 L of normal saline IV fluids and started on empiric antibiotics of Rocephin.  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[place])    Chronic kidney disease    Depression    Hyperlipidemia    Hypertension    Stroke [facility])     Past Surgical History:  Procedure Laterality Date   ABDOMINAL HYSTERECTOMY     ARM SURGERY     BIOPSY  10/09/2021   Procedure: BIOPSY;  Surgeon: Irene Shipper, MD;  Location: [facility] ENDOSCOPY;  Service: Endoscopy;;  CARDIAC CATHETERIZATION  2013   negative   EP IMPLANTABLE DEVICE N/A 10/17/2015   Procedure: Loop Recorder Insertion;  Surgeon: Will Meredith Leeds, MD;  Location: [place] [facility] LAB;  Service: Cardiovascular;  Laterality: N/A;   ESOPHAGOGASTRODUODENOSCOPY (EGD) WITH PROPOFOL N/A 10/09/2021   Procedure: ESOPHAGOGASTRODUODENOSCOPY (EGD) WITH PROPOFOL;  Surgeon: Irene Shipper, MD;  Location: [facility] ENDOSCOPY;  Service: Endoscopy;  Laterality: N/A;   EYE SURGERY     FEMUR IM NAIL Right 10/20/2015   Procedure: INTRAMEDULLARY (IM) RETROGRADE FEMORAL NAILING;  Surgeon: Tania Ade, MD;  Location: [place];  Service:  Orthopedics;  Laterality: Right;   HEMORROIDECTOMY     KNEE SURGERY     TEE WITHOUT CARDIOVERSION N/A 10/17/2015   Procedure: TRANSESOPHAGEAL ECHOCARDIOGRAM (TEE);  Surgeon: Jerline Pain, MD;  Location: [facility] ENDOSCOPY;  Service: Cardiovascular;  Laterality: N/A;     reports that she has never smoked. She has never used smokeless tobacco. She reports that she does not drink alcohol and does not use drugs.  No Known Allergies  Family History  Problem Relation Age of Onset   Heart disease Mother 6   Stroke Father    Heart disease Brother 55    Prior to Admission medications   Medication Sig Start Date End Date Taking? Authorizing Provider  ALPRAZolam (XANAX XR) 0.5 MG 24 hr tablet Take 1 tablet (0.5 mg total) by mouth daily. 10/16/21   Arrien, Jimmy Picket, MD  apixaban (ELIQUIS) 5 MG TABS tablet TAKE 1 TABLET (5 MG TOTAL) BY MOUTH TWO TIMES DAILY. 03/06/21 03/06/22  Nita Sells, MD  atorvastatin (LIPITOR) 20 MG tablet Take 20 mg by mouth daily. 08/16/18   [provider]  buPROPion (WELLBUTRIN XL) 150 MG 24 hr tablet Take by mouth. 06/23/18   [provider]  Cholecalciferol (VITAMIN D3) 1000 units CAPS Take by mouth.    [provider]  donepezil (ARICEPT) 10 MG tablet Take 10 mg by mouth daily. 12/24/20   [provider]  FLUoxetine (PROZAC) 40 MG capsule Take 1 capsule (40 mg total) by mouth daily. 05/17/18 05/17/19  Frann Rider, NP  levothyroxine (SYNTHROID, LEVOTHROID) 50 MCG tablet  07/27/18   [provider]  memantine (NAMENDA) 10 MG tablet Take 10 mg by mouth 2 (two) times daily. 12/11/20   [provider]  Omega-3 Fatty Acids (FISH OIL) 1000 MG CAPS Take by mouth.    [provider]  pantoprazole (PROTONIX) 40 MG tablet Take 1 tablet (40 mg total) by mouth 2 (two) times daily for 15 days. After 15 days, will change to daily. 10/16/21 10/31/21  Arrien, Jimmy Picket, MD  polyvinyl alcohol (LIQUIFILM TEARS)  1.4 % ophthalmic solution Place 1 drop into both eyes every hour as needed for dry eyes.    [provider]  primidone (MYSOLINE) 50 MG tablet Take 50 mg by mouth 2 (two) times daily. Take 2 tablets in the morning and 1 tablet in the evening.    [provider]  propranolol (INDERAL) 10 MG tablet Take 1 tablet (10 mg total) by mouth 2 (two) times daily. 03/06/21   Nita Sells, MD  UNABLE TO FIND Mega red take one daily    [provider]  Vitamin D, Ergocalciferol, (DRISDOL) 1.25 MG (50000 UNIT) CAPS capsule Take 50,000 Units by mouth every 14 (fourteen) days. 01/07/21   [provider]    Physical Exam:  Constitutional: Elderly female who appears to be lethargic, but able to question with repeated attempts Vitals:  11/22/21 1100 11/22/21 1115 11/22/21 1130 11/22/21 1145  BP: (!) 125/59 122/70 (!) 111/53 132/61  Pulse: 92 91 87 88  Resp: _0 Temp:      TempSrc:      SpO2: 95% 96% 96% 97%   Eyes: PERRL, lids and conjunctivae normal ENMT: Mucous membranes are dry. Posterior pharynx clear of any exudate or lesions.  Edentulous Neck: normal, supple, no masses, no thyromegaly Respiratory: clear to auscultation bilaterally, no wheezing, no crackles. Normal respiratory effort. No accessory muscle use.  Cardiovascular: Regular rate and rhythm, no murmurs / rubs / gallops. No extremity edema. 2+ pedal pulses. No carotid bruits.  Abdomen: no tenderness, no masses palpated. No hepatosplenomegaly. Bowel sounds positive.  Musculoskeletal: no clubbing / cyanosis. No joint deformity upper and lower extremities. Good ROM, no contractures. Normal muscle tone.  Skin: no rashes, lesions, ulcers. No induration Neurologic: CN 2-12 grossly intact. Sensation intact, DTR normal. Strength 5/5 in all 4.  Psychiatric: Normal judgment and insight. Alert and oriented x 3. Normal mood.     Labs on Admission: I have personally reviewed following labs and imaging  studies  CBC: Recent Labs  Lab 11/22/21 0833 11/22/21 0914  WBC 10.2  --   NEUTROABS 7.8*  --   HGB 14.8 15.3*  HCT 46.1* 45.0  MCV 96.8  --   PLT 312  --     Basic Metabolic Panel: Recent Labs  Lab 11/22/21 0833 11/22/21 0914  NA 139 139  K 3.8 3.7  CL 105  --   CO2 11*  --   GLUCOSE 96  --   BUN 30*  --   CREATININE 1.99*  --   CALCIUM 8.5*  --     GFR: CrCl cannot be calculated (Unknown ideal weight.). Liver Function Tests: Recent Labs  Lab 11/22/21 0833  AST 26  ALT 21  ALKPHOS 238*  BILITOT 1.2  PROT 6.2*  ALBUMIN 2.8*    No results for input(s): LIPASE, AMYLASE in the last 168 hours. Recent Labs  Lab 11/22/21 0833  AMMONIA <10    Coagulation Profile: No results for input(s): INR, PROTIME in the last 168 hours. Cardiac Enzymes: No results for input(s): CKTOTAL, CKMB, CKMBINDEX, TROPONINI in the last 168 hours. BNP (last 3 results) No results for input(s): PROBNP in the last 8760 hours. HbA1C: No results for input(s): HGBA1C in the last 72 hours. CBG: Recent Labs  Lab 11/22/21 0857  GLUCAP 93    Lipid Profile: No results for input(s): CHOL, HDL, LDLCALC, TRIG, CHOLHDL, LDLDIRECT in the last 72 hours. Thyroid Function Tests: No results for input(s): TSH, T4TOTAL, FREET4, T3FREE, THYROIDAB in the last 72 hours. Anemia Panel: No results for input(s): VITAMINB12, FOLATE, FERRITIN, TIBC, IRON, RETICCTPCT in the last 72 hours. Urine analysis:    Component Value Date/Time   COLORURINE AMBER (A) 10/08/2021 2008   APPEARANCEUR CLOUDY (A) 10/08/2021 2008   LABSPEC 1.014 10/08/2021 2008   PHURINE 6.0 10/08/2021 2008   GLUCOSEU NEGATIVE 10/08/2021 2008   HGBUR LARGE (A) 10/08/2021 2008   BILIRUBINUR NEGATIVE 10/08/2021 2008   KETONESUR 20 (A) 10/08/2021 2008   PROTEINUR 100 (A) 10/08/2021 2008   UROBILINOGEN 1.0 10/20/2015 1124   NITRITE NEGATIVE 10/08/2021 2008   LEUKOCYTESUR NEGATIVE 10/08/2021 2008   Sepsis Labs: Recent Results (from  the past 240 hour(s))  Resp Panel by RT-PCR (Flu A&B, Covid) Nasopharyngeal Swab     Status: None   Collection Time: 11/22/21 10:07 AM  Specimen: Nasopharyngeal Swab; Nasopharyngeal(NP) swabs in vial transport medium  Result Value Ref Range Status   SARS Coronavirus 2 by RT PCR NEGATIVE NEGATIVE Final    Comment: (NOTE) SARS-CoV-2 target nucleic acids are NOT DETECTED.  The SARS-CoV-2 RNA is generally detectable in upper respiratory specimens during the acute phase of infection. The lowest concentration of SARS-CoV-2 viral copies this assay can detect is 138 copies/mL. A negative result does not preclude SARS-Cov-2 infection and should not be used as the sole basis for treatment or other patient management decisions. A negative result may occur with  improper specimen collection/handling, submission of specimen other than nasopharyngeal swab, presence of viral mutation(s) within the areas targeted by this assay, and inadequate number of viral copies(<138 copies/mL). A negative result must be combined with clinical observations, patient history, and epidemiological information. The expected result is Negative.  Fact Sheet for Patients:  EntrepreneurPulse.com.au  Fact Sheet for Healthcare Providers:  IncredibleEmployment.be  This test is no t yet approved or cleared by the [place] FDA and  has been authorized for detection and/or diagnosis of SARS-CoV-2 by FDA under an Emergency Use Authorization (EUA). This EUA will remain  in effect (meaning this test can be used) for the duration of the COVID-19 declaration under Section 564(b)(1) of the Act, 21 U.S.C.section 360bbb-3(b)(1), unless the authorization is terminated  or revoked sooner.       Influenza A by PCR NEGATIVE NEGATIVE Final   Influenza B by PCR NEGATIVE NEGATIVE Final    Comment: (NOTE) The Xpert Xpress SARS-CoV-2/FLU/RSV plus assay is intended as an aid in the diagnosis of  influenza from Nasopharyngeal swab specimens and should not be used as a sole basis for treatment. Nasal washings and aspirates are unacceptable for Xpert Xpress SARS-CoV-2/FLU/RSV testing.  Fact Sheet for Patients: EntrepreneurPulse.com.au  Fact Sheet for Healthcare Providers: IncredibleEmployment.be  This test is not yet approved or cleared by the [place] FDA and has been authorized for detection and/or diagnosis of SARS-CoV-2 by FDA under an Emergency Use Authorization (EUA). This EUA will remain in effect (meaning this test can be used) for the duration of the COVID-19 declaration under Section 564(b)(1) of the Act, 21 U.S.C. section 360bbb-3(b)(1), unless the authorization is terminated or revoked.  Performed at [facility], [place] [address] [place], [place] [address]      Radiological Exams on Admission: CT HEAD WO CONTRAST  Result Date: 11/22/2021 CLINICAL DATA:  80 year old female with history of trauma from a fall yesterday. Dementia. EXAM: CT HEAD WITHOUT CONTRAST CT CERVICAL SPINE WITHOUT CONTRAST TECHNIQUE: Multidetector CT imaging of the head and cervical spine was performed following the standard protocol without intravenous contrast. Multiplanar CT image reconstructions of the cervical spine were also generated. COMPARISON:  CT of the head and cervical spine November 04, 2021. FINDINGS: CT HEAD FINDINGS Brain: Moderate cerebral atrophy. Patchy and confluent areas of decreased attenuation are noted throughout the deep and periventricular white matter of the cerebral hemispheres bilaterally, compatible with chronic microvascular ischemic disease. Well-defined focus of low attenuation in the left cerebellar hemisphere, compatible with an old infarct. No evidence of acute infarction, hemorrhage, hydrocephalus, extra-axial collection or mass lesion/mass effect. Vascular: No hyperdense vessel or unexpected calcification. Skull:  Normal. Negative for fracture or focal lesion. Sinuses/Orbits: No acute finding. Other: None. CT CERVICAL SPINE FINDINGS Alignment: Normal. Skull base and vertebrae: No acute fracture. No primary bone lesion or focal pathologic process. Soft tissues and spinal canal: No prevertebral fluid or swelling. No visible  canal hematoma. Disc levels: Multilevel degenerative disc disease, most pronounced at C4-C5, C5-C6 and C6-C7. Moderate multilevel facet arthropathy. Upper chest: Negative. Other: None. IMPRESSION: 1. No evidence of significant acute traumatic injury to the skull, brain or cervical spine. 2. Moderate cerebral atrophy with chronic microvascular ischemic changes in the cerebral white matter and old left cerebellar infarct, as above. 3. Multilevel degenerative disc disease and cervical spondylosis, as above. Electronically Signed   By: Vinnie Langton M.D.   On: 11/22/2021 10:17   CT Cervical Spine Wo Contrast  Result Date: 11/22/2021 CLINICAL DATA:  80 year old female with history of trauma from a fall yesterday. Dementia. EXAM: CT HEAD WITHOUT CONTRAST CT CERVICAL SPINE WITHOUT CONTRAST TECHNIQUE: Multidetector CT imaging of the head and cervical spine was performed following the standard protocol without intravenous contrast. Multiplanar CT image reconstructions of the cervical spine were also generated. COMPARISON:  CT of the head and cervical spine November 04, 2021. FINDINGS: CT HEAD FINDINGS Brain: Moderate cerebral atrophy. Patchy and confluent areas of decreased attenuation are noted throughout the deep and periventricular white matter of the cerebral hemispheres bilaterally, compatible with chronic microvascular ischemic disease. Well-defined focus of low attenuation in the left cerebellar hemisphere, compatible with an old infarct. No evidence of acute infarction, hemorrhage, hydrocephalus, extra-axial collection or mass lesion/mass effect. Vascular: No hyperdense vessel or unexpected  calcification. Skull: Normal. Negative for fracture or focal lesion. Sinuses/Orbits: No acute finding. Other: None. CT CERVICAL SPINE FINDINGS Alignment: Normal. Skull base and vertebrae: No acute fracture. No primary bone lesion or focal pathologic process. Soft tissues and spinal canal: No prevertebral fluid or swelling. No visible canal hematoma. Disc levels: Multilevel degenerative disc disease, most pronounced at C4-C5, C5-C6 and C6-C7. Moderate multilevel facet arthropathy. Upper chest: Negative. Other: None. IMPRESSION: 1. No evidence of significant acute traumatic injury to the skull, brain or cervical spine. 2. Moderate cerebral atrophy with chronic microvascular ischemic changes in the cerebral white matter and old left cerebellar infarct, as above. 3. Multilevel degenerative disc disease and cervical spondylosis, as above. Electronically Signed   By: Vinnie Langton M.D.   On: 11/22/2021 10:17   DG Pelvis Portable  Result Date: 11/22/2021 CLINICAL DATA:  Altered mental status EXAM: PORTABLE PELVIS 1-2 VIEWS COMPARISON:  10/08/2021 CT FINDINGS: There is no evidence of pelvic fracture or diastasis. No pelvic bone lesions are seen. Generalized osteopenia. Right femoral nail IMPRESSION: No acute finding. Electronically Signed   By: Jorje Guild M.D.   On: 11/22/2021 09:12   DG Chest Portable 1 View  Result Date: 11/22/2021 CLINICAL DATA:  80 year old female with history of altered mental status. Dementia at baseline. EXAM: PORTABLE CHEST 1 VIEW COMPARISON:  Chest x-ray 11/04/2021. FINDINGS: Lung volumes are normal. No consolidative airspace disease. No pleural effusions. No pneumothorax. No pulmonary nodule or mass noted. Pulmonary vasculature and the cardiomediastinal silhouette are within normal limits. Atherosclerosis in the thoracic aorta. Electronic device projecting over the lower left hemithorax, likely an implantable loop recorder. IMPRESSION: 1.  No radiographic evidence of acute  cardiopulmonary disease. 2. Aortic atherosclerosis. Electronically Signed   By: Vinnie Langton M.D.   On: 11/22/2021 08:58    EKG: Independently reviewed.  Sinus rhythm at 99bpm  Assessment/Plan  Acute metabolic encephalopathy: Patient was noted to be found unresponsive by her husband this morning.  Similar presentation during last hospitalization.  Suspect multifactorial in the setting of dementia, dehydration, and possible infection with fever. -Admit to a medical telemetry bed -Neurochecks  Fever secondary to suspected  UTI: Upon admission into the emergency department patient was noted to be febrile up to 100.6 F.  No other SIRS criteria met.  Chest x-ray otherwise noted to be clear.  Previously hospitalized last month with a urinary tract infection secondary to Pseudomonas.  She had been empirically treated with Rocephin IV. -Follow-up urinalysis -Check blood and urine cultures -Continue Rocephin IV -Tylenol as needed for fever  Lactic acidosis: Acute.  Lactic acid was elevated at 2.6.  Suspect secondary to dehydration and/or infection. -Continue to trend lactic acid  Acute kidney injury: Patient presents with creatinine elevated up to 1.99 with BUN 30.  Baseline creatinine previously had been less than 1.  He had received 1 L normal saline IV fluids. -Follow-up urinalysis -Add on CK given recent fall -Continue normal saline IV fluids at 100 mL/h -Recheck BMP tomorrow morning  Fall at home: Patient's husband states that she fell off the toilet yesterday while he was trying to get her up as she slipped out of his hands.  He reports having difficulty caring for the patient currently at baseline -PT/OT to evaluate -Transitions of care  Dementia depression: Patient has poor recent memory at baseline. -Continue Aricept, Namenda, Wellbutrin, Prozac, and Ativan  History of CVA  -Continue atorvastatin  Protein calorie malnutrition: Chronic.  Albumin 2.8. 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 -Continue Eliquis with dose adjusted for kidney function  GERD gastric ulcers: Patient had been previously found to have gastric ulcers and large caliber esophageal ring during last hospitalization on EGD from 10/13. -Continue Protonix  DVT prophylaxis: Eliquis Code Status: Full Family Communication: Been updated over the phone Disposition Plan: To be determined Consults called: None Admission status: Inpatient require more than 2 midnight stay for need IV fluid rehydration  Norval Morton MD [facility]   If 7PM-7AM, please contact night-coverage   11/22/2021, 1:00 PM

## 2021-11-23 DIAGNOSIS — Z7189 Other specified counseling: Secondary | ICD-10-CM | POA: Diagnosis not present

## 2021-11-23 DIAGNOSIS — Z711 Person with feared health complaint in whom no diagnosis is made: Secondary | ICD-10-CM

## 2021-11-23 DIAGNOSIS — R509 Fever, unspecified: Secondary | ICD-10-CM | POA: Diagnosis not present

## 2021-11-23 DIAGNOSIS — Z66 Do not resuscitate: Secondary | ICD-10-CM

## 2021-11-23 DIAGNOSIS — G9341 Metabolic encephalopathy: Secondary | ICD-10-CM

## 2021-11-23 DIAGNOSIS — R638 Other symptoms and signs concerning food and fluid intake: Secondary | ICD-10-CM

## 2021-11-23 DIAGNOSIS — N3000 Acute cystitis without hematuria: Secondary | ICD-10-CM

## 2021-11-23 DIAGNOSIS — R7989 Other specified abnormal findings of blood chemistry: Secondary | ICD-10-CM

## 2021-11-23 DIAGNOSIS — F03918 Unspecified dementia, unspecified severity, with other behavioral disturbance: Secondary | ICD-10-CM

## 2021-11-23 DIAGNOSIS — Z515 Encounter for palliative care: Secondary | ICD-10-CM

## 2021-11-23 DIAGNOSIS — E876 Hypokalemia: Secondary | ICD-10-CM

## 2021-11-23 DIAGNOSIS — R6251 Failure to thrive (child): Secondary | ICD-10-CM

## 2021-11-23 DIAGNOSIS — E43 Unspecified severe protein-calorie malnutrition: Secondary | ICD-10-CM

## 2021-11-23 DIAGNOSIS — W19XXXA Unspecified fall, initial encounter: Secondary | ICD-10-CM | POA: Diagnosis not present

## 2021-11-23 DIAGNOSIS — E8729 Other acidosis: Secondary | ICD-10-CM

## 2021-11-23 DIAGNOSIS — N179 Acute kidney failure, unspecified: Secondary | ICD-10-CM | POA: Diagnosis not present

## 2021-11-23 DIAGNOSIS — Z789 Other specified health status: Secondary | ICD-10-CM

## 2021-11-23 LAB — CBC
HCT: 35 % — ABNORMAL LOW (ref 36.0–46.0)
Hemoglobin: 11.8 g/dL — ABNORMAL LOW (ref 12.0–15.0)
MCH: 31.5 pg (ref 26.0–34.0)
MCHC: 33.7 g/dL (ref 30.0–36.0)
MCV: 93.3 fL (ref 80.0–100.0)
Platelets: 235 10*3/uL (ref 150–400)
RBC: 3.75 MIL/uL — ABNORMAL LOW (ref 3.87–5.11)
RDW: 19.6 % — ABNORMAL HIGH (ref 11.5–15.5)
WBC: 9.2 10*3/uL (ref 4.0–10.5)
nRBC: 0 % (ref 0.0–0.2)

## 2021-11-23 LAB — URIC ACID: Uric Acid, Serum: 8 mg/dL — ABNORMAL HIGH (ref 2.5–7.1)

## 2021-11-23 LAB — PREALBUMIN: Prealbumin: 12.2 mg/dL — ABNORMAL LOW (ref 18–38)

## 2021-11-23 LAB — TSH: TSH: 3.295 u[IU]/mL (ref 0.350–4.500)

## 2021-11-23 LAB — BASIC METABOLIC PANEL
Anion gap: 16 — ABNORMAL HIGH (ref 5–15)
BUN: 29 mg/dL — ABNORMAL HIGH (ref 8–23)
CO2: 15 mmol/L — ABNORMAL LOW (ref 22–32)
Calcium: 7.8 mg/dL — ABNORMAL LOW (ref 8.9–10.3)
Chloride: 110 mmol/L (ref 98–111)
Creatinine, Ser: 1.57 mg/dL — ABNORMAL HIGH (ref 0.44–1.00)
GFR, Estimated: 33 mL/min — ABNORMAL LOW (ref >60)
Glucose, Bld: 105 mg/dL — ABNORMAL HIGH (ref 70–99)
Potassium: 3.4 mmol/L — ABNORMAL LOW (ref 3.5–5.1)
Sodium: 141 mmol/L (ref 135–145)

## 2021-11-23 LAB — CK: Total CK: 966 U/L — ABNORMAL HIGH (ref 38–234)

## 2021-11-23 LAB — LACTIC ACID, PLASMA: Lactic Acid, Venous: 1.6 mmol/L (ref 0.5–1.9)

## 2021-11-23 MED ORDER — POTASSIUM CHLORIDE 10 MEQ/100ML IV SOLN
10.0000 meq | INTRAVENOUS | Status: AC
  Administered 2021-11-23 (×3): 10 meq via INTRAVENOUS
  Filled 2021-11-23 (×4): qty 100

## 2021-11-23 MED ORDER — CEFEPIME HCL 2 G IJ SOLR
2.0000 g | INTRAMUSCULAR | Status: DC
  Administered 2021-11-23: 10:00:00 2 g via INTRAVENOUS
  Filled 2021-11-23 (×2): qty 2

## 2021-11-23 MED ORDER — SODIUM CHLORIDE 0.9 % IV SOLN
1.0000 g | Freq: Four times a day (QID) | INTRAVENOUS | Status: DC
  Administered 2021-11-23 – 2021-11-24 (×5): 1 g via INTRAVENOUS
  Filled 2021-11-23 (×7): qty 1000

## 2021-11-23 MED ORDER — POLYVINYL ALCOHOL 1.4 % OP SOLN
1.0000 [drp] | OPHTHALMIC | Status: DC | PRN
  Filled 2021-11-23: qty 15

## 2021-11-23 MED ORDER — COLCHICINE 0.6 MG PO TABS
0.6000 mg | ORAL_TABLET | Freq: Every day | ORAL | Status: DC
  Administered 2021-11-23: 17:00:00 0.6 mg via ORAL
  Filled 2021-11-23 (×2): qty 1

## 2021-11-23 MED ORDER — FENTANYL CITRATE PF 50 MCG/ML IJ SOSY
12.5000 ug | PREFILLED_SYRINGE | INTRAMUSCULAR | Status: DC | PRN
  Administered 2021-11-24: 05:00:00 12.5 ug via INTRAVENOUS
  Filled 2021-11-23: qty 1

## 2021-11-23 MED ORDER — SODIUM BICARBONATE 8.4 % IV SOLN
INTRAVENOUS | Status: DC
  Filled 2021-11-23 (×5): qty 1000

## 2021-11-23 MED ORDER — POTASSIUM CHLORIDE 10 MEQ/100ML IV SOLN
10.0000 meq | Freq: Once | INTRAVENOUS | Status: AC
  Administered 2021-11-23: 17:00:00 10 meq via INTRAVENOUS

## 2021-11-23 MED ORDER — PANTOPRAZOLE SODIUM 40 MG IV SOLR
40.0000 mg | Freq: Two times a day (BID) | INTRAVENOUS | Status: DC
Start: 2021-11-23 — End: 2021-11-24
  Administered 2021-11-23 – 2021-11-24 (×3): 40 mg via INTRAVENOUS
  Filled 2021-11-23 (×3): qty 40

## 2021-11-23 NOTE — Consult Note (Signed)
Consultation Note Date: 11/23/2021   Patient Name: Nicole Ruiz  DOB: November 22, 1941  MRN: [id number]  Age / Sex: 80 y.o., female  PCP: Bernerd Limbo, MD Referring Physician: Mercy Riding, MD  Reason for Consultation: Establishing goals of care, "Coming back to the hospital with fever decreased p.o. intake."  HPI/Patient Profile: 80 y.o. female  with past medical history of CVA, hypertension, hyperlipidemia, CKD, and dementia presented to ED from home after being found unresponsive by husband. The day prior husband reports patient had a fall off the toilet seat. Patient was admitted on 11/22/2021 with acute metabolic encephalopathy, fever secondary to UTI, AKI, fall at home, protein calorie malnutrition.  ED Course: Upon admission into the emergency department patient was seen to be febrile up to 100.6 F and all other vital signs maintained.  CT of the head and cervical spine did not note any acute abnormality labs significant for hemoglobin 15.3, BUN 30, creatinine 1.99, alkaline phosphatase 238, and lactic acid 2.6.  Venous pH was within normal limits with CO2 27.2.  Chest and pelvis x-ray did not note any acute abnormality.  Nursing had attempted that in and out cath but obtain nothing.  Patient had been given 1 L of normal saline IV fluids and started on empiric antibiotics of Rocephin.  Clinical Assessment and Goals of Care: I have reviewed medical records including EPIC notes, labs, and imaging. Received report from primary RN - acute concern that patient is refusing all oral intake/medications. Patient refused to work with PT today.  Went to visit patient at bedside - no family/visitors present. Patient was lying in bed asleep - she does arouse briefly to voice/gentle touch but keeps her eyes closed. No signs or non-verbal gestures of pain or discomfort noted. No respiratory distress, increased work of  breathing, or secretions noted. Patient does endorse having pain, but cannot tell me where. She states she "doesn't feel good" today. She shakes her head no when asked if she's [place]. She is not able to tell me her name - disoriented x4.   Met with husband via phone  to discuss diagnosis, prognosis, GOC, EOL wishes, disposition, and options.  I introduced Palliative Medicine as specialized medical care for people living with serious illness. It focuses on providing relief from the symptoms and stress of a serious illness. The goal is to improve quality of life for both the patient and the family.  We discussed a brief life review of the patient as well as functional and nutritional status. Patient and [name] have been married for 42 years; although they have no biological children together, [name] has 3 daughters and 1 son whom she considers her children. Patient was hospitalized at [facility] about 6 weeks ago - at that time patient was discharged to [facility] for rehab. She was there about two weeks before husband brought her back home. Patient lives in a private residence with [name] as her primary caregiver. Patient had 1 nurse and PT visit per week since her leaving rehab. [name] explains that  since she has returned from rehab, she does not walk and cannot dress/bathe herself - he reports that caring for her during this time "has been something else." Patient was diagnosed with dementia about 4 weeks ago while at [facility]; however, [name] states he saw signs many months prior to diagnosis.   We discussed patient's current illness and what it means in the larger context of patient's on-going co-morbidities.  [name] understands that dementia is a progressive, non-curable disease underlying the patient's current acute medical conditions. Natural disease trajectory and expectations at EOL were discussed. I attempted to elicit values and goals of care important to the patient. The difference between aggressive  medical intervention and comfort care was considered in light of the patient's goals of care. Reviewed that currently, she is not eating/drinking. We discussed that evidence has shown that PEG tubes in patients with advanced dementia do not increase survival, prevent aspiration, or improve wound healing.  They can promote isolation and use of restraints leading to increased potential for pressure ulcers. Use of PEG tubes is not medically recommended in this population; rather, careful hand feeding with aspiration precautions has been shown to provide the best quality of life. Oren Section is clear he does not want the patient to suffer. After discussion he does not wish to pursue PEG tube.  Provided education and counseling at length on the philosophy and benefits of hospice care. Discussed that it offers a holistic approach to care in the setting of end-stage illness, and is about supporting the patient where they are allowing nature to take it's course. Discussed the hospice team includes RNs, physicians, social workers, and chaplains. They can provide personal care, support for the family, and help keep patient out of the hospital as well as assist with DME needs for home hospice. Education provided on the difference between home vs residential hospice. Carrol understands that patient is at high risk for rehospitalization without discharge with hospice services. [name] specifically asks about [facility]. He is open to [facility] transfer but requests to meet with PMT in person before making final decision. Meeting schedule for tomorrow - encouraged [name] to invite any other family he felt would be important to the conversation to come with him. He is understandably emotional and tearful.  Discussed with patient/family the importance of continued conversation with each other and the medical providers regarding overall plan of care and treatment options, ensuring decisions are within the context of the  patient's values and GOCs.    Questions and concerns were addressed. The patient/family was encouraged to call with questions and/or concerns. PMT number was provided.    Primary Decision Maker: NEXT OF KIN - husband/Carroll Aderman    SUMMARY OF RECOMMENDATIONS   Continue current medical treatment  Continue DNR/DNI as previously documented Husband open for patient discharge to residential hospice, requesting [facility]. He requests to meet PMT in person before making final decision/sending referral Husband does not wish to pursue PEG tube Scheduled meeting with husband/family for tomorrow 11/28 at 2pm PMT will continue to follow and support holistically  Code Status/Advance Care Planning: DNR  Palliative Prophylaxis:  Aspiration, Bowel Regimen, Delirium Protocol, Frequent Pain Assessment, Oral Care, and Turn Reposition  Additional Recommendations (Limitations, Scope, Preferences): Full Scope Treatment  Psycho-social/Spiritual:  Created space and opportunity for patient and family to express thoughts and feelings regarding patient's current medical situation.  Emotional support and therapeutic listening provided.  Prognosis:  < 2 weeks  Discharge Planning: likely Hospice facility  Primary Diagnoses: Present on Admission:  Fever  History of pulmonary embolus (PE)  Hypothyroid  Hypertension  UTI (urinary tract infection)  Pulmonary embolism, bilateral (HCC)  AKI (acute kidney injury) ([place])  Fall   I have reviewed the medical record, interviewed the patient and family, and examined the patient. The following aspects are pertinent.  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   Social Determinants of Health   Financial Resource Strain: Not on file  Food Insecurity: Not on file  Transportation Needs: Not on file  Physical Activity: Not on file  Stress: Not on file  Social Connections: Not on file   Family History  Problem Relation Age of Onset   Heart disease Mother 55   Stroke Father    Heart disease Brother 90   Scheduled Meds:  apixaban  5 mg Oral BID   buPROPion  150 mg Oral Daily   donepezil  10 mg Oral Daily   FLUoxetine  40 mg Oral Daily   levothyroxine  50 mcg Oral Q0600   memantine  10 mg Oral BID   pantoprazole (PROTONIX) IV  40 mg Intravenous Q12H   propranolol  10 mg Oral BID   sodium chloride flush  3 mL Intravenous Q12H   Continuous Infusions:  ceFEPime (MAXIPIME) IV 2 g (11/23/21 0953)   potassium chloride 10 mEq (11/23/21 1543)   sodium bicarbonate 150 mEq in D5W infusion 100 mL/hr at 11/23/21 1500   PRN Meds:.acetaminophen **OR** acetaminophen, ondansetron **OR** ondansetron (ZOFRAN) IV, polyvinyl alcohol Medications Prior to Admission:  Prior to Admission medications   Medication Sig Start Date End Date Taking? Authorizing Provider  ALPRAZolam (XANAX XR) 0.5 MG 24 hr tablet Take 1 tablet (0.5 mg total) by mouth daily. 10/16/21  Yes Arrien, Jimmy Picket, MD  apixaban (ELIQUIS) 5 MG TABS tablet TAKE 1 TABLET (5 MG TOTAL) BY MOUTH TWO TIMES DAILY. 03/06/21 03/06/22 Yes Nita Sells, MD  atorvastatin (LIPITOR) 20 MG tablet Take 20 mg by mouth daily. 08/16/18  Yes [provider]  buPROPion (WELLBUTRIN XL) 150 MG 24 hr tablet Take 150 mg by mouth daily. 06/23/18  Yes [provider]  Cholecalciferol (VITAMIN D3) 1000 units CAPS Take by mouth.   Yes [provider]  donepezil (ARICEPT) 10 MG tablet Take 10 mg by mouth daily. 12/24/20  Yes [provider]  FLUoxetine (PROZAC) 40 MG capsule Take 1 capsule (40 mg total) by mouth daily. 05/17/18 11/22/21 Yes McCue, Janett Billow, NP  levothyroxine (SYNTHROID, LEVOTHROID) 50 MCG tablet  07/27/18  Yes [provider]  memantine (NAMENDA) 10 MG tablet Take 10 mg by mouth 2 (two) times daily. 12/11/20  Yes [provider]  Omega-3 Fatty Acids ([place])  1000 MG CAPS Take by mouth.   Yes [provider]  pantoprazole (PROTONIX) 40 MG tablet Take 1 tablet (40 mg total) by mouth 2 (two) times daily for 15 days. After 15 days, will change to daily. Patient taking differently: Take 40 mg by mouth daily. 10/16/21 11/22/21 Yes Arrien, Jimmy Picket, MD  polyvinyl alcohol (LIQUIFILM TEARS) 1.4 % ophthalmic solution Place 1 drop into both eyes every hour as needed for dry eyes.   Yes [provider]  primidone (MYSOLINE) 50 MG tablet Take 50-100 mg by mouth 2 (two) times daily. Take 2 tablets in the morning and 1 tablet in the evening.   Yes [provider]  propranolol (INDERAL) 10 MG tablet Take 1 tablet (10 mg total) by mouth 2 (two) times daily. 03/06/21  Yes Nita Sells, MD  UNABLE TO FIND Mega red take one daily   Yes [provider]  Vitamin D, Ergocalciferol, (DRISDOL) 1.25 MG (50000 UNIT) CAPS capsule Take 50,000 Units by mouth every 14 (fourteen) days. 01/07/21  Yes [provider]   No Known Allergies Review of Systems  Unable to perform ROS: Dementia   Physical Exam Vitals and nursing note reviewed.  Constitutional:      General: She is not in acute distress.    Appearance: She is ill-appearing.  Pulmonary:     Effort: No respiratory distress.  Skin:    General: Skin is warm and dry.  Neurological:     Mental Status: She is lethargic,  disoriented and confused.     Motor: Weakness present.  Psychiatric:        Speech: Speech is delayed.        Behavior: Behavior is slowed.        Cognition and Memory: Cognition is impaired. Memory is impaired.    Vital Signs: BP 123/69 (BP Location: Right Arm)   Pulse 74   Temp 98.2 F (36.8 C) (Oral)   Resp 18   Wt 48.3 kg   SpO2 100%   BMI 18.86 kg/m  Pain Scale: PAINAD   Pain Score: 0-No pain   SpO2: SpO2: 100 % O2 Device:SpO2: 100 % O2 Flow Rate: .   IO: Intake/output summary:  Intake/Output Summary (Last 24 hours) at 11/23/2021 1548 Last data filed at 11/23/2021 1500 Gross per 24 hour  Intake 2312.7 ml  Output --  Net 2312.7 ml    LBM:   Baseline Weight: Weight: 57.7 kg Most recent weight: Weight: 48.3 kg     Palliative Assessment/Data: PPS 10%     Time In: 1600 Time Out: 1716 Time Total: 76 minutes  Greater than 50%  of this time was spent counseling and coordinating care related to the above assessment and plan.  Signed by: Lin Landsman, NP   Please contact Palliative Medicine Team phone at [phone] for questions and concerns.  For individual provider: See Shea Evans

## 2021-11-23 NOTE — Progress Notes (Signed)
PROGRESS NOTE  Nicole Ruiz AWC:[id number] DOB: 25-Aug-1941   PCP: Tracey Harries, MD  Patient is from: Home.  Lives with husband who is caregiver.  Dependent for ADLs  DOA: 11/22/2021 LOS: 1  Chief complaints:  Chief Complaint  Patient presents with   Weakness     Brief Narrative / Interim history: 80 year old M with PMH of severe dementia, CVA, HTN, HLD, PE on Eliquis, and recent hospitalization for Pseudomonas UTI, T1-2 endplate compression fracture, dysphagia, distal esophageal ring, gastric ulcers and FTT returning with decreased responsiveness after she had accidental fall the day prior.  She also had poor p.o. intake and generalized weakness 4 days prior to this.   In ED, febrile to 100.6.  No leukocytosis.Cr 2.0 (baseline 1.0). CT head and cervical spine without acute finding.  Lactic acid 2.6.  Not able to get urine on I&O cath.  Empirically started on IV ceftriaxone and admitted for possible UTI, acute metabolic encephalopathy, FTT, AKI and accidental fall.  CK elevated to 1000.   Subjective: Seen and examined earlier this morning.  No major events overnight of this morning.  Per RN report, patient refused her medications or p.o. intake.  She is a sleepy but wakes to voice.  She is only oriented to self.  She thinks she is at home.  Not able to provide more history.  Objective: Vitals:   11/22/21 2214 11/23/21 0309 11/23/21 0343 11/23/21 0739  BP: 138/66 (!) 131/57  123/69  Pulse: 93 75  74  Resp: 17 19  18   Temp: 98.2 F (36.8 C) 98.2 F (36.8 C)  98.2 F (36.8 C)  TempSrc: Oral Oral  Oral  SpO2: 98% 98%  100%  Weight:   48.3 kg     Intake/Output Summary (Last 24 hours) at 11/23/2021 1022 Last data filed at 11/23/2021 0300 Gross per 24 hour  Intake 1402.7 ml  Output 300 ml  Net 1102.7 ml   Filed Weights   11/22/21 1300 11/23/21 0343  Weight: 57.7 kg 48.3 kg    Examination:  GENERAL: Frail and chronically ill-appearing.  Grimacing when I try to move her  arms and legs HEENT: MMM.  Vision and hearing grossly intact.  NECK: Supple.  No apparent JVD.  RESP: 100% on RA.  No IWOB.  Fair aeration bilaterally. CVS:  RRR. Heart sounds normal.  ABD/GI/GU: BS+. Abd soft, NTND but inconsistently grimacing during palpation.  MSK/EXT: No apparent deformity but limited exam due to mental status. SKIN: no apparent skin lesion or wound NEURO: Sleepy but wakes to voice.  Oriented only to self.  No apparent focal neuro deficit but limited exam due to her mental status.11/25/21 PSYCH: Calm but grimacing and moaning when I moved her arms and legs.  Procedures:  None  Microbiology summarized: COVID-19 and influenza PCR nonreactive. Blood cultures pending. Urine culture pending.  Assessment & Plan: Acute metabolic encephalopathy in patient with history of severe dementia: similar presentation in the past when she was treated for dehydration and Pseudomonas UTI.  She is sleepy but wakes to voice.  Only oriented to self.  Does not follows commands.  Limited neuro exam.  CXR, CT head, CT cervical spine, TSH, B12 and ammonia unrevealing.  UA with moderate LE, small Hgb and rare bacteria.  Concern about possible UTI, failure to thrive, dehydration and polypharmacy.  Has history of Pseudomonas UTI. -Treat treatable causes as below. -Reorientation delirium precautions -Continue Aricept, Namenda, Wellbutrin and Prozac if able to take p.o. -Minimize/avoid sedating medications -Discontinue  Ativan.   Fever secondary to suspected UTI: Febrile to 100.6 on arrival.   No other SIRS criteria met.   -Change ceftriaxone to cefepime given history of Pseudomonas UTI. -Follow urine culture and blood culture   Lactic acidosis: Could be from dehydration or infection.  Resolved. -Continue IV fluid  AKI/azotemia: Likely prerenal from poor p.o. intake and dehydration.  She also has mild rhabdo.  Improving. Anion gap metabolic acidosis: Could be due to AKI or starvation ketosis. Recent  Labs    10/09/21 0442 10/10/21 0408 10/11/21 0446 10/12/21 0359 10/13/21 0136 10/14/21 0351 10/15/21 0445 11/04/21 1645 11/22/21 0833 11/23/21 0301  BUN 9 8 7* $Re'8 9 10 11 18 'TWx$ 30* 29*  CREATININE 0.74 0.66 0.71 0.65 0.67 0.71 0.82 1.12* 1.99* 1.57*  -Continue IV fluid-changed to sodium bicarbonate given metabolic acidosis -Continue monitoring -Monitor urine output  Rhabdomyolysis/accidental fall at home/generalized weakness/history of T1-2 compression fracture -IV fluids -Fall precautions -PT/OT  History of CVA-CT head negative.  No focal neurodeficit but very limited exam due to mental status. -Continue atorvastatin   Hypothyroidism: TSH within normal. 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 -Discontinue tramadol -Continue home Eliquis.  Will change to Lovenox if refuses p.o.   Distal esophageal ring/gastric ulcer -Change p.o. Protonix to IV  Hypokalemia: K3.4. -IV KCl 10x4  Goal of care counseling-patient with severe dementia, failure to thrive and recurrent hospitalization.  She is very frail.  Poor prognosis.  Definitely hospice candidate.  She is already dependent for all ADLs.  I talked to her husband, Mr. Shere.  We talked about CODE STATUS.  We talked about pros and cons of CPR and intubation.  He agreed that DNR/DNR is to her best interest.  He says, " I do not want her to suffer more".  We also I also brought up about feeding tube. For now, we have agreed to continue IV fluid hydration and IV antibiotics, and see if her mental status improves, and she starts take PO in the next 24 to 48 hours before we discussed about alternative feeding. -Palliative care consulted on admission as well.  Severe protein calorie malnutrition/failure to thrive Body mass index is 18.86 kg/m. -Consult dietitian          DVT prophylaxis:   apixaban (ELIQUIS) tablet 5 mg  Code  Status: DNR/DNI Family Communication: Updated patient's husband over the phone. Level of care: Telemetry Medical Status is: Inpatient  Remains inpatient appropriate because: Acute metabolic encephalopathy, possible UTI, AKI, rhabdomyolysis       Consultants:  Palliative medicine   Sch Meds:  Scheduled Meds:  apixaban  5 mg Oral BID   atorvastatin  20 mg Oral Daily   buPROPion  150 mg Oral Daily   donepezil  10 mg Oral Daily   FLUoxetine  40 mg Oral Daily   levothyroxine  50 mcg Oral Q0600   memantine  10 mg Oral BID   pantoprazole (PROTONIX) IV  40 mg Intravenous Q12H   propranolol  10 mg Oral BID   sodium chloride flush  3 mL Intravenous Q12H   Continuous Infusions:  ceFEPime (MAXIPIME) IV 2 g (11/23/21 0953)   sodium bicarbonate 150 mEq in D5W infusion 100 mL/hr at 11/23/21 0854   PRN Meds:.acetaminophen **OR** acetaminophen, ALPRAZolam, ondansetron **OR** ondansetron (ZOFRAN) IV, polyvinyl alcohol  Antimicrobials: Anti-infectives (From admission, onward)    Start     Dose/Rate Route Frequency Ordered Stop  11/23/21 1100  cefTRIAXone (ROCEPHIN) 1 g in sodium chloride 0.9 % 100 mL IVPB  Status:  Discontinued        1 g 200 mL/hr over 30 Minutes Intravenous Every 24 hours 11/22/21 1345 11/23/21 0730   11/23/21 0900  ceFEPIme (MAXIPIME) 2 g in sodium chloride 0.9 % 100 mL IVPB        2 g 200 mL/hr over 30 Minutes Intravenous Every 24 hours 11/23/21 0758     11/22/21 1130  cefTRIAXone (ROCEPHIN) 1 g in sodium chloride 0.9 % 100 mL IVPB        1 g 200 mL/hr over 30 Minutes Intravenous  Once 11/22/21 1127 11/22/21 1253        I have personally reviewed the following labs and images: CBC: Recent Labs  Lab 11/22/21 0833 11/22/21 0914 11/23/21 0301  WBC 10.2  --  9.2  NEUTROABS 7.8*  --   --   HGB 14.8 15.3* 11.8*  HCT 46.1* 45.0 35.0*  MCV 96.8  --  93.3  PLT 312  --  235   BMP &GFR Recent Labs  Lab 11/22/21 0833 11/22/21 0914 11/23/21 0301  NA 139  139 141  K 3.8 3.7 3.4*  CL 105  --  110  CO2 11*  --  15*  GLUCOSE 96  --  105*  BUN 30*  --  29*  CREATININE 1.99*  --  1.57*  CALCIUM 8.5*  --  7.8*   Estimated Creatinine Clearance: 21.8 mL/min (A) (by C-G formula based on SCr of 1.57 mg/dL (H)). Liver & Pancreas: Recent Labs  Lab 11/22/21 0833  AST 26  ALT 21  ALKPHOS 238*  BILITOT 1.2  PROT 6.2*  ALBUMIN 2.8*   No results for input(s): LIPASE, AMYLASE in the last 168 hours. Recent Labs  Lab 11/22/21 0833  AMMONIA <10   Diabetic: No results for input(s): HGBA1C in the last 72 hours. Recent Labs  Lab 11/22/21 0857  GLUCAP 93   Cardiac Enzymes: Recent Labs  Lab 11/23/21 0301  CKTOTAL 966*   No results for input(s): PROBNP in the last 8760 hours. Coagulation Profile: No results for input(s): INR, PROTIME in the last 168 hours. Thyroid Function Tests: Recent Labs    11/23/21 0301  TSH 3.295   Lipid Profile: No results for input(s): CHOL, HDL, LDLCALC, TRIG, CHOLHDL, LDLDIRECT in the last 72 hours. Anemia Panel: No results for input(s): VITAMINB12, FOLATE, FERRITIN, TIBC, IRON, RETICCTPCT in the last 72 hours. Urine analysis:    Component Value Date/Time   COLORURINE AMBER (A) 11/22/2021 0833   APPEARANCEUR CLOUDY (A) 11/22/2021 0833   LABSPEC 1.020 11/22/2021 0833   PHURINE 5.0 11/22/2021 0833   GLUCOSEU NEGATIVE 11/22/2021 0833   HGBUR SMALL (A) 11/22/2021 0833   BILIRUBINUR SMALL (A) 11/22/2021 0833   KETONESUR 20 (A) 11/22/2021 0833   PROTEINUR 30 (A) 11/22/2021 0833   UROBILINOGEN 1.0 10/20/2015 1124   NITRITE NEGATIVE 11/22/2021 0833   LEUKOCYTESUR MODERATE (A) 11/22/2021 0833   Sepsis Labs: Invalid input(s): PROCALCITONIN, [place]  Microbiology: Recent Results (from the past 240 hour(s))  Culture, blood (single)     Status: None (Preliminary result)   Collection Time: 11/22/21  8:45 AM   Specimen: BLOOD  Result Value Ref Range Status   Specimen Description BLOOD LEFT ANTECUBITAL   Final   Special Requests   Final    BOTTLES DRAWN AEROBIC AND ANAEROBIC Blood Culture results may not be optimal due to an inadequate volume of  blood received in culture bottles   Culture   Final    NO GROWTH < 24 HOURS Performed at [facility], [place] [address], [place], [place] [address]    Report Status PENDING  Incomplete  Resp Panel by RT-PCR (Flu A&B, Covid) Nasopharyngeal Swab     Status: None   Collection Time: 11/22/21 10:07 AM   Specimen: Nasopharyngeal Swab; Nasopharyngeal(NP) swabs in vial transport medium  Result Value Ref Range Status   SARS Coronavirus 2 by RT PCR NEGATIVE NEGATIVE Final    Comment: (NOTE) SARS-CoV-2 target nucleic acids are NOT DETECTED.  The SARS-CoV-2 RNA is generally detectable in upper respiratory specimens during the acute phase of infection. The lowest concentration of SARS-CoV-2 viral copies this assay can detect is 138 copies/mL. A negative result does not preclude SARS-Cov-2 infection and should not be used as the sole basis for treatment or other patient management decisions. A negative result may occur with  improper specimen collection/handling, submission of specimen other than nasopharyngeal swab, presence of viral mutation(s) within the areas targeted by this assay, and inadequate number of viral copies(<138 copies/mL). A negative result must be combined with clinical observations, patient history, and epidemiological information. The expected result is Negative.  Fact Sheet for Patients:  EntrepreneurPulse.com.au  Fact Sheet for Healthcare Providers:  IncredibleEmployment.be  This test is no t yet approved or cleared by the [place] FDA and  has been authorized for detection and/or diagnosis of SARS-CoV-2 by FDA under an Emergency Use Authorization (EUA). This EUA will remain  in effect (meaning this test can be used) for the duration of the COVID-19 declaration under Section  564(b)(1) of the Act, 21 U.S.C.section 360bbb-3(b)(1), unless the authorization is terminated  or revoked sooner.       Influenza A by PCR NEGATIVE NEGATIVE Final   Influenza B by PCR NEGATIVE NEGATIVE Final    Comment: (NOTE) The Xpert Xpress SARS-CoV-2/FLU/RSV plus assay is intended as an aid in the diagnosis of influenza from Nasopharyngeal swab specimens and should not be used as a sole basis for treatment. Nasal washings and aspirates are unacceptable for Xpert Xpress SARS-CoV-2/FLU/RSV testing.  Fact Sheet for Patients: EntrepreneurPulse.com.au  Fact Sheet for Healthcare Providers: IncredibleEmployment.be  This test is not yet approved or cleared by the [place] FDA and has been authorized for detection and/or diagnosis of SARS-CoV-2 by FDA under an Emergency Use Authorization (EUA). This EUA will remain in effect (meaning this test can be used) for the duration of the COVID-19 declaration under Section 564(b)(1) of the Act, 21 U.S.C. section 360bbb-3(b)(1), unless the authorization is terminated or revoked.  Performed at [facility], [place] [address] [place], [place] [address]     Radiology Studies: US RENAL  Result Date: 11/22/2021 CLINICAL DATA:  Acute kidney injury. EXAM: RENAL / URINARY TRACT ULTRASOUND COMPLETE COMPARISON:  CT, 10/08/2021 FINDINGS: Right Kidney: Renal measurements: 7.9 x 4.3 x 3.7 cm = volume: 65.6 mL. Diffuse renal cortical thinning. Mild increased renal parenchymal echogenicity., no mass or stone. No hydronephrosis. Left Kidney: Renal measurements: 9.1 x 4.1 x 3.1 cm = volume: 60.7 mL. Diffuse renal cortical thinning with increased renal parenchymal echogenicity. Cyst arises from the upper pole, 3.4 x 3.2 x 3.4 cm. No other masses, no stones and no hydronephrosis. Bladder: Appears normal for degree of bladder distention. Other: None. IMPRESSION: 1. No acute findings. 2. Bilateral small kidneys with  diffuse cortical thinning and increased renal parenchymal echogenicity consistent with medical renal disease.  Left upper pole renal cyst. No hydronephrosis. Electronically Signed   By: Lajean Manes M.D.   On: 11/22/2021 13:07      Amela Handley T. [place]  If 7PM-7AM, please contact night-coverage [email or web address] 11/23/2021, 10:22 AM

## 2021-11-23 NOTE — Plan of Care (Signed)
  Problem: Safety: Goal: Ability to remain free from injury will improve Outcome: Progressing   

## 2021-11-23 NOTE — Evaluation (Signed)
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[place])    Chronic kidney disease    Depression    Hyperlipidemia    Hypertension    Stroke [facility])    Past Surgical History:  Past Surgical History:  Procedure Laterality Date   ABDOMINAL HYSTERECTOMY     ARM SURGERY     BIOPSY  10/09/2021   Procedure: BIOPSY;  Surgeon: Irene Shipper, MD;  Location: [facility] ENDOSCOPY;  Service: Endoscopy;;   CARDIAC CATHETERIZATION  2013   negative   EP IMPLANTABLE DEVICE N/A 10/17/2015   Procedure: Loop Recorder Insertion;  Surgeon: Will Meredith Leeds, MD;  Location: [facility];  Service: Cardiovascular;  Laterality: N/A;   ESOPHAGOGASTRODUODENOSCOPY (EGD) WITH PROPOFOL N/A 10/09/2021   Procedure: ESOPHAGOGASTRODUODENOSCOPY (EGD) WITH PROPOFOL;  Surgeon: Irene Shipper, MD;  Location: [facility] ENDOSCOPY;  Service: Endoscopy;  Laterality: N/A;   EYE SURGERY     FEMUR IM NAIL Right 10/20/2015   Procedure: INTRAMEDULLARY (IM) RETROGRADE FEMORAL NAILING;  Surgeon: Tania Ade, MD;  Location: [facility];  Service: Orthopedics;  Laterality: Right;   HEMORROIDECTOMY     KNEE SURGERY     TEE WITHOUT CARDIOVERSION N/A 10/17/2015   Procedure: TRANSESOPHAGEAL ECHOCARDIOGRAM (TEE);  Surgeon: Jerline Pain, MD;  Location: [facility] ENDOSCOPY;  Service: Cardiovascular;  Laterality: N/A;   HPI:  80 year old female admitted with decreased responsiveness after fall. Concerns for UTI, FTT, poor PO intake.  PMH of severe dementia, CVA, HTN, HLD, PE on Eliquis, and recent hospitalization for Pseudomonas UTI, T1-2 endplate compression fracture, dysphagia, distal esophageal ring, gastric ulcers and FTT returning with  decreased responsiveness after she had accidental fall the day prior. She has had prior swallow assessements; most recent was October of this year with unremarkable findings.    Assessment / Plan / Recommendation  Clinical Impression  Pt difficult to rouse for exam; not following commands; maintained eyes closed.  She accepted limited sips of water from a spoon with oral holding and eventual swallow; no overt s/sx of aspiration. She was unable to draw water from a straw.  She shook her head and said no with multiple attempts to offer pudding/cracker.  For now, recommend continuing efforts at feeding her items from dysphagia 3 tray, thin liquids - hopefully her MS will improve and allow her to take some POs. Palliative Care has been consulted. Her advanced dementia raises the concern for appropriateness of alternative feeding.  SLP will follow for education/safety and await results of [place] discussion. SLP Visit Diagnosis: Dysphagia, unspecified (R13.10)    Aspiration Risk    unknown   Diet Recommendation   Dysphagia 3, thin liquids  Medication Administration: Crushed with puree    Other  Recommendations Oral Care Recommendations: Oral care BID    Recommendations for follow up therapy are one component of a multi-disciplinary discharge planning process, led by the attending physician.  Recommendations may be updated based on patient status, additional functional criteria and insurance authorization.  Follow up Recommendations Other (comment) (tba)      Assistance Recommended at Discharge    Functional Status Assessment    Frequency and Duration min 2x/week  1 week       Prognosis  Prognosis for Safe Diet Advancement: Guarded Barriers to Reach Goals: Cognitive deficits      Swallow Study   General HPI: 80 year old female admitted with decreased responsiveness after fall. Concerns for UTI, FTT, poor PO intake.  PMH of severe dementia, CVA, HTN, HLD, PE on Eliquis, and recent  hospitalization for Pseudomonas UTI, T1-2 endplate compression fracture, dysphagia, distal esophageal ring, gastric ulcers and FTT returning with decreased responsiveness after she had accidental fall the day prior. She has had prior swallow assessements; most recent was October of this year with unremarkable findings. Type of Study: Bedside Swallow Evaluation Previous Swallow Assessment: See HPI Diet Prior to this Study: Dysphagia 3 (soft) Temperature Spikes Noted: No Respiratory Status: Room air History of Recent Intubation: No Behavior/Cognition: Alert;Cooperative;Lethargic/Drowsy Oral Cavity Assessment:  (difficult to assess) Oral Cavity - Dentition: Edentulous Vision:  (pt kept eyes closed) Self-Feeding Abilities: Total assist Patient Positioning: Upright in bed Baseline Vocal Quality: Normal Volitional Cough: Cognitively unable to elicit Volitional Swallow: Unable to elicit    Oral/Motor/Sensory Function Overall Oral Motor/Sensory Function: Other (comment) (symmetric at baseline)   Ice Chips Ice chips: Not tested   Thin Liquid Thin Liquid: Impaired Presentation: Spoon;Straw Oral Phase Impairments: Poor awareness of bolus Oral Phase Functional Implications: Oral holding    Nectar Thick Nectar Thick Liquid: Not tested   Honey Thick Honey Thick Liquid: Not tested   Puree Puree: Not tested Presentation:  (declined)   Solid     Solid:  (unable)      Acie Custis Laurice 11/23/2021,11:26 AM  Estill Bamberg L. Tivis Ringer, [place] Office number [phone] Pager [phone]

## 2021-11-23 NOTE — Evaluation (Signed)
Occupational Therapy Evaluation Patient Details Name: ARLY SALMINEN MRN: [id number] DOB: 1941-01-29 Today's Date: 11/23/2021   History of Present Illness 80 year old female admitted with decreased responsiveness after fall. Concerns for UTI, FTT, poor PO intake.  PMH of severe dementia, CVA, HTN, HLD, PE on Eliquis, and recent hospitalization for Pseudomonas UTI, T1-2 endplate compression fracture, dysphagia, distal esophageal ring, gastric ulcers and FTT returning with decreased responsiveness after she had accidental fall the day prior.   Clinical Impression   Pt admitted for concerns listed above. PTA pt was requiring assist for all ADL's and functional mobility, per chart. Pt was minimally participatory this session, reporting only that everything hurt, stating "leave me alone" multiple times, and hollering out with movement of her R knee.  Pt requiring max-total assist +2 for bed mobility and max/total support to maintain upright posture sitting EOB. Recommending SNF level therapies to progress pt mobility and ADL performance in order to reduce caregiver burden.      Recommendations for follow up therapy are one component of a multi-disciplinary discharge planning process, led by the attending physician.  Recommendations may be updated based on patient status, additional functional criteria and insurance authorization.   Follow Up Recommendations  Skilled nursing-short term rehab (<3 hours/day)    Assistance Recommended at Discharge Frequent or constant Supervision/Assistance  Functional Status Assessment  Patient has had a recent decline in their functional status and/or demonstrates limited ability to make significant improvements in function in a reasonable and predictable amount of time  Equipment Recommendations  Other (comment) (TBD)    Recommendations for Other Services       Precautions / Restrictions Precautions Precautions: Fall Precaution Comments: Recent  fall Restrictions Weight Bearing Restrictions: No      Mobility Bed Mobility Overal bed mobility: Needs Assistance Bed Mobility: Supine to Sit;Sit to Supine;Rolling Rolling: Max assist   Supine to sit: +2 for physical assistance;Total assist Sit to supine: +2 for physical assistance;Total assist   General bed mobility comments: Max to total assist for all bed mobiltiy    Transfers                   General transfer comment: Unable to attempt due to pt participation      Balance Overall balance assessment: Needs assistance Sitting-balance support: No upper extremity supported;Feet unsupported Sitting balance-Leahy Scale: Zero Sitting balance - Comments: Pt with posterior lean for entire time sitting EOB, requiring total support from OT positioned behind pt facilitating cervical flexion and upright posture. 1 instance of pt moving her trunk forward, requring max assist to keep pt from tipping forward. Postural control: Posterior lean                                 ADL either performed or assessed with clinical judgement   ADL Overall ADL's : Needs assistance/impaired                                       General ADL Comments: Total assist for all ADL's and functional mobility at this time, pt not participating inany mobility or ADL'     Vision   Additional Comments: Pt kept eyes losed for 95% of the session, unsure prior visoin.     Perception     Praxis      Pertinent Vitals/Pain Pain Assessment: PAINAD  Breathing: normal Negative Vocalization: occasional moan/groan, low speech, negative/disapproving quality Facial Expression: facial grimacing Body Language: tense, distressed pacing, fidgeting Consolability: distracted or reassured by voice/touch PAINAD Score: 5 Pain Intervention(s): Limited activity within patient's tolerance;Monitored during session;Repositioned;Other (comment) (Provided pt with warm blankets)     Hand  Dominance Right   Extremity/Trunk Assessment Upper Extremity Assessment Upper Extremity Assessment: RUE deficits/detail;LUE deficits/detail RUE Deficits / Details: Pt not moving UE at all, Able to range [facility]. LUE Deficits / Details: Pt not moving UE at all, able to PROM [facility]   Lower Extremity Assessment Lower Extremity Assessment: Defer to PT evaluation   Cervical / Trunk Assessment Cervical / Trunk Assessment: Other exceptions Cervical / Trunk Exceptions: Tending to keep cervical spine flexed, head and shoulders forward   Communication Communication Communication: Other (comment) (Only verbalizations were in the vein of "leave me alone")   Cognition Arousal/Alertness: Lethargic (Eyes opened briefly upon sitting up EOB) Behavior During Therapy: Flat affect Overall Cognitive Status: No family/caregiver present to determine baseline cognitive functioning                                 General Comments: Noted history of dementia; Consistently refusing meds/food/drink today     General Comments  Pt able to verbalize that she is in pain; when asked where, she states "all over"; Otherwise, pt repeating "leave me alone"; did show minimal social pragmatics by saying "bye"    Exercises     Shoulder Instructions      Home Living Family/patient expects to be discharged to:: Private residence Living Arrangements: Spouse/significant other Available Help at Discharge: Family;Available 24 hours/day Type of Home: House Home Access: Stairs to enter [employer] of Steps: 3+3 Entrance Stairs-Rails: Right;Left;Can reach both Home Layout: One level     Bathroom Shower/Tub: Teacher, early years/pre: Standard     Home Equipment: Conservation officer, nature (2 wheels);BSC/3in1;Wheelchair - manual   Additional Comments: Above information gleaned from chart review      Prior Functioning/Environment Prior Level of Function : Needs assist;History of Falls (last six  months);Patient poor historian/Family not available;Other (comment) (r H&P, pt's husband assists for all ADLs and mobiltiy)                        OT Problem List: Decreased strength;Decreased range of motion;Decreased activity tolerance;Impaired balance (sitting and/or standing);Decreased coordination;Decreased cognition;Decreased safety awareness;Decreased knowledge of use of DME or AE;Cardiopulmonary status limiting activity;Impaired sensation;Impaired tone;Pain;Impaired UE functional use      OT Treatment/Interventions: Self-care/ADL training;Therapeutic exercise;Energy conservation;DME and/or AE instruction;Therapeutic activities;Cognitive remediation/compensation;Patient/family education;Balance training    OT Goals(Current goals can be found in the care plan section) Acute Rehab OT Goals Patient Stated Goal: "Leave me alone or I'm going to die." OT Goal Formulation: Patient unable to participate in goal setting Time For Goal Achievement: 12/07/21 Potential to Achieve Goals: Fair  OT Frequency: Min 2X/week   Barriers to D/C:            Co-evaluation PT/OT/SLP Co-Evaluation/Treatment: Yes Reason for Co-Treatment: Necessary to address cognition/behavior during functional activity;For patient/therapist safety;To address functional/ADL transfers   OT goals addressed during session: ADL's and self-care;Other (comment) (Working to arouse pt/get pt to participate)      AM-PAC OT "6 Clicks" Daily Activity     Outcome Measure Help from another person eating meals?: Total Help from another person taking care of personal grooming?: Total Help  from another person toileting, which includes using toliet, bedpan, or urinal?: Total Help from another person bathing (including washing, rinsing, drying)?: Total Help from another person to put on and taking off regular upper body clothing?: Total Help from another person to put on and taking off regular lower body clothing?: Total 6  Click Score: 6   End of Session Nurse Communication: Mobility status;Other (comment) (Pt pain in her R knee, which is swollen and warm compared to her L knee)  Activity Tolerance: Patient limited by lethargy Patient left: in bed;with call bell/phone within reach;with bed alarm set  OT Visit Diagnosis: Unsteadiness on feet (R26.81);Other abnormalities of gait and mobility (R26.89);Muscle weakness (generalized) (M62.81)                Time: 3428-7681 OT Time Calculation (min): 23 min Charges:  OT General Charges $OT Visit: 1 Visit OT Evaluation $OT Eval Moderate Complexity: 1 Mod  Rion Schnitzer H., OTR/L Acute Rehabilitation  Lexii Walsh Elane Yolanda Bonine 11/23/2021, 8:50 PM

## 2021-11-23 NOTE — Progress Notes (Signed)
Pharmacy Antibiotic Note  Nicole Ruiz is a 80 y.o. female admitted on 11/22/2021 with fever secondary to suspected UTI. Of note, the patient was recently hospitalized in 09/2021 in which they had a urine culture with Pseudomonas, treated with 7 days of cefepime IV 2 g Q24H. The patient is now afebrile (Tmax of 100.6 F upon admission), SCr 1.57, WBC 9.2. Pharmacy has been consulted for cefepime dosing.  Plan: Initiate cefepime IV 2 g Q24H Monitor clinical status, renal function, cultures, and length of therapy Adjust cefepime dose as needed for renal function De-escalate antibiotic therapy as clinically indicated  Weight: 48.3 kg (106 lb 7.7 oz)  Temp (24hrs), Avg:98.5 F (36.9 C), Min:98.1 F (36.7 C), Max:100.6 F (38.1 C)  Recent Labs  Lab 11/22/21 0833 11/22/21 0848 11/22/21 1302 11/23/21 0301  WBC 10.2  --   --  9.2  CREATININE 1.99*  --   --  1.57*  LATICACIDVEN  --  2.6* 2.4* 1.6    Estimated Creatinine Clearance: 21.8 mL/min (A) (by C-G formula based on SCr of 1.57 mg/dL (H)).    No Known Allergies  Antimicrobials this admission: 11/26 ceftriaxone x 1 dose 11/27 cefepime >>  Microbiology results: 11/26 UCx: sent 11/26 BCx: sent 11/26 Flu A/B/COVID: negative  Thank you for allowing pharmacy to be a part of this patient's care.  Shauna Hugh, PharmD, [place]  PGY-2 Pharmacy Resident 11/23/2021 8:03 AM  Please check [email or web address] for unit-specific pharmacy phone numbers.

## 2021-11-23 NOTE — Evaluation (Signed)
Physical Therapy Evaluation Patient Details Name: Nicole Ruiz MRN: [id number] DOB: 03/05/1941 Today's Date: 11/23/2021  History of Present Illness  80 year old female admitted with decreased responsiveness after fall. Concerns for UTI, FTT, poor PO intake.  PMH of severe dementia, CVA, HTN, HLD, PE on Eliquis, and recent hospitalization for Pseudomonas UTI, T1-2 endplate compression fracture, dysphagia, distal esophageal ring, gastric ulcers and FTT returning with decreased responsiveness after she had accidental fall the day prior.  Clinical Impression   Pt admitted with above diagnosis. Admitted from home where she lives with her husband; Noting that pt's husband is having difficulty meeting her needs and caring for her; Recent falls at home as well; Presents to PT/OT with generalized weakness, generalized as well as R knee pain, difficulty participating in her care; 2 person Total assist to get up to sitting EOB; Eyes open briefly when sitting up initially; Majority of pt's verbalizations were related to "leave me alone"; Noting pt is now DNR status;  Pt currently with functional limitations due to the deficits listed below (see PT Problem List). Pt will benefit from skilled PT to increase their independence and safety with mobility to allow discharge to the venue listed below.    Notified Dr. Cyndia Skeeters re: notably incr grimace and pain when moving R knee, as well as R knee swelling and warmth        Recommendations for follow up therapy are one component of a multi-disciplinary discharge planning process, led by the attending physician.  Recommendations may be updated based on patient status, additional functional criteria and insurance authorization.  Follow Up Recommendations Skilled nursing-short term rehab (<3 hours/day)    Assistance Recommended at Discharge Frequent or constant Supervision/Assistance  Functional Status Assessment Patient has had a recent decline in their functional  status and/or demonstrates limited ability to make significant improvements in function in a reasonable and predictable amount of time  Equipment Recommendations  Hospital bed;Other (comment) (Consider hoyer lift)    Recommendations for Other Services OT consult (as ordered)     Precautions / Restrictions Precautions Precautions: Fall Precaution Comments: Recent fall Restrictions Weight Bearing Restrictions: No      Mobility  Bed Mobility Overal bed mobility: Needs Assistance Bed Mobility: Supine to Sit;Sit to Supine;Rolling Rolling: Max assist   Supine to sit: +2 for physical assistance;Total assist Sit to supine: +2 for physical assistance;Total assist   General bed mobility comments: Max to total assist for all bed mobiltiy    Transfers                        Ambulation/Gait                  Stairs            Wheelchair Mobility    Modified Rankin (Stroke Patients Only)       Balance                                             Pertinent Vitals/Pain Pain Assessment: PAINAD Breathing: normal Negative Vocalization: occasional moan/groan, low speech, negative/disapproving quality Facial Expression: facial grimacing Body Language: tense, distressed pacing, fidgeting Consolability: distracted or reassured by voice/touch PAINAD Score: 5 Pain Intervention(s): Repositioned;Other (comment) (provided pt with warm blankets)    Home Living Family/patient expects to be discharged to:: Private residence Living Arrangements: Spouse/significant other Available  Help at Discharge: Family;Available 24 hours/day Type of Home: House Home Access: Stairs to enter Entrance Stairs-Rails: Right;Left;Can reach both Entrance Stairs-Number of Steps: 3+3     Home Equipment: Conservation officer, nature (2 wheels);BSC/3in1;Wheelchair - manual Additional Comments: Above information gleaned from chart review    Prior Function Prior Level of Function :  Needs assist;History of Falls (last six months);Patient poor historian/Family not available;Other (comment) (Per H&P, pt's husband assists for all ADLs and mobiltiy)  Cognitive Assist : Mobility (cognitive);ADLs (cognitive)                   Hand Dominance   Dominant Hand: Right    Extremity/Trunk Assessment   Upper Extremity Assessment Upper Extremity Assessment: Defer to OT evaluation    Lower Extremity Assessment Lower Extremity Assessment: RLE deficits/detail;LLE deficits/detail RLE Deficits / Details: Notable for R knee swelling, pain, and warmth compared to L knee; ROM grossly WFL hip, knee, ankle, but did not note active, volitional movement of LEs LLE Deficits / Details: ROM grossly WFL hip, knee, ankle, but did not note active, volitional movement of LEs    Cervical / Trunk Assessment Cervical / Trunk Assessment: Other exceptions Cervical / Trunk Exceptions: Tending to keep cervical spine flexed, head and shoulders forward  Communication   Communication: Other (comment) (Only verbalizations were in the vein of "leave me alone")  Cognition Arousal/Alertness: Lethargic (Eyes opened briefly upon sitting up EOB) Behavior During Therapy: Flat affect Overall Cognitive Status: No family/caregiver present to determine baseline cognitive functioning                                 General Comments: Noted history of dementia; Consistently refusing meds/food/drink today        General Comments General comments (skin integrity, edema, etc.): Pt able to verbalize that she is in pain; when asked where, she states "all over"; Otherwise, pt repeating "leave me alone"; did show minimal social pragmatics by saying "bye"    Exercises     Assessment/Plan    PT Assessment Patient needs continued PT services  PT Problem List Decreased strength;Decreased range of motion;Decreased activity tolerance;Decreased balance;Decreased mobility;Decreased coordination;Decreased  cognition;Decreased knowledge of use of DME;Decreased safety awareness;Decreased knowledge of precautions;Pain       PT Treatment Interventions DME instruction;Gait training;Functional mobility training;Therapeutic activities;Therapeutic exercise;Balance training;Neuromuscular re-education;Cognitive remediation;Patient/family education    PT Goals (Current goals can be found in the Care Plan section)  Acute Rehab PT Goals Patient Stated Goal: Unable to state PT Goal Formulation: With patient Time For Goal Achievement: 12/07/21 Potential to Achieve Goals: Fair    Frequency Min 2X/week   Barriers to discharge Other (comment) Noted husband is having difficulty caring for pt at home    Co-evaluation PT/OT/SLP Co-Evaluation/Treatment: Yes Reason for Co-Treatment: Necessary to address cognition/behavior during functional activity;For patient/therapist safety;To address functional/ADL transfers PT goals addressed during session: Mobility/safety with mobility;Other (comment) (Ability to arouse/particiapte)         AM-PAC PT "6 Clicks" Mobility  Outcome Measure Help needed turning from your back to your side while in a flat bed without using bedrails?: A Lot Help needed moving from lying on your back to sitting on the side of a flat bed without using bedrails?: Total Help needed moving to and from a bed to a chair (including a wheelchair)?: Total Help needed standing up from a chair using your arms (e.g., wheelchair or bedside chair)?: Total Help needed to  walk in hospital room?: Total Help needed climbing 3-5 steps with a railing? : Total 6 Click Score: 7    End of Session Equipment Utilized During Treatment: Other (comment) (bed pad, extra pillows) Activity Tolerance: Patient limited by lethargy;Patient limited by pain Patient left: in bed;with call bell/phone within reach;with bed alarm set;Other (comment) (bed in semi-chair position) Nurse Communication: Other (comment) (Painful R  knee) PT Visit Diagnosis: Other abnormalities of gait and mobility (R26.89);Pain;Muscle weakness (generalized) (M62.81);History of falling (Z91.81);Repeated falls (R29.6);Adult, failure to thrive (R62.7)    Time: 8403-9795 PT Time Calculation (min) (ACUTE ONLY): 23 min   Charges:   PT Evaluation $PT Eval Moderate Complexity: 1 Mod          Roney Marion, [place]  Acute Rehabilitation Services Pager [phone] Office [phone]   Colletta [place] 11/23/2021, 4:49 PM

## 2021-11-23 NOTE — Progress Notes (Signed)
Attempted to administer oral meds multiple times- patient will not open mouth. Multiple techinques used such as adding pudding, ice cream to no avail.

## 2021-11-24 DIAGNOSIS — I2699 Other pulmonary embolism without acute cor pulmonale: Secondary | ICD-10-CM | POA: Diagnosis not present

## 2021-11-24 DIAGNOSIS — F0390 Unspecified dementia without behavioral disturbance: Secondary | ICD-10-CM

## 2021-11-24 DIAGNOSIS — W19XXXA Unspecified fall, initial encounter: Secondary | ICD-10-CM | POA: Diagnosis not present

## 2021-11-24 DIAGNOSIS — R509 Fever, unspecified: Secondary | ICD-10-CM | POA: Diagnosis not present

## 2021-11-24 DIAGNOSIS — Z7189 Other specified counseling: Secondary | ICD-10-CM | POA: Diagnosis not present

## 2021-11-24 DIAGNOSIS — N3000 Acute cystitis without hematuria: Secondary | ICD-10-CM | POA: Diagnosis not present

## 2021-11-24 DIAGNOSIS — Z515 Encounter for palliative care: Secondary | ICD-10-CM

## 2021-11-24 DIAGNOSIS — Z8673 Personal history of transient ischemic attack (TIA), and cerebral infarction without residual deficits: Secondary | ICD-10-CM

## 2021-11-24 DIAGNOSIS — B952 Enterococcus as the cause of diseases classified elsewhere: Secondary | ICD-10-CM

## 2021-11-24 DIAGNOSIS — N179 Acute kidney failure, unspecified: Secondary | ICD-10-CM | POA: Diagnosis not present

## 2021-11-24 DIAGNOSIS — E860 Dehydration: Secondary | ICD-10-CM

## 2021-11-24 LAB — CBC
HCT: 28.9 % — ABNORMAL LOW (ref 36.0–46.0)
Hemoglobin: 10.1 g/dL — ABNORMAL LOW (ref 12.0–15.0)
MCH: 31.5 pg (ref 26.0–34.0)
MCHC: 34.9 g/dL (ref 30.0–36.0)
MCV: 90 fL (ref 80.0–100.0)
Platelets: 189 10*3/uL (ref 150–400)
RBC: 3.21 MIL/uL — ABNORMAL LOW (ref 3.87–5.11)
RDW: 18.9 % — ABNORMAL HIGH (ref 11.5–15.5)
WBC: 6.9 10*3/uL (ref 4.0–10.5)
nRBC: 0 % (ref 0.0–0.2)

## 2021-11-24 LAB — RENAL FUNCTION PANEL
Albumin: 1.8 g/dL — ABNORMAL LOW (ref 3.5–5.0)
Anion gap: 9 (ref 5–15)
BUN: 21 mg/dL (ref 8–23)
CO2: 24 mmol/L (ref 22–32)
Calcium: 7.2 mg/dL — ABNORMAL LOW (ref 8.9–10.3)
Chloride: 108 mmol/L (ref 98–111)
Creatinine, Ser: 0.99 mg/dL (ref 0.44–1.00)
GFR, Estimated: 58 mL/min — ABNORMAL LOW (ref >60)
Glucose, Bld: 131 mg/dL — ABNORMAL HIGH (ref 70–99)
Phosphorus: 1.2 mg/dL — ABNORMAL LOW (ref 2.5–4.6)
Potassium: 2.9 mmol/L — ABNORMAL LOW (ref 3.5–5.1)
Sodium: 141 mmol/L (ref 135–145)

## 2021-11-24 LAB — CK: Total CK: 564 U/L — ABNORMAL HIGH (ref 38–234)

## 2021-11-24 LAB — MAGNESIUM: Magnesium: 1.2 mg/dL — ABNORMAL LOW (ref 1.7–2.4)

## 2021-11-24 MED ORDER — POTASSIUM PHOSPHATES 15 MMOLE/5ML IV SOLN
30.0000 mmol | Freq: Once | INTRAVENOUS | Status: DC
  Administered 2021-11-24: 09:00:00 30 mmol via INTRAVENOUS
  Filled 2021-11-24: qty 10

## 2021-11-24 MED ORDER — HALOPERIDOL LACTATE 5 MG/ML IJ SOLN: 0.5000 mg | INTRAMUSCULAR | Status: DC | PRN

## 2021-11-24 MED ORDER — LORAZEPAM 2 MG/ML IJ SOLN: 1.0000 mg | INTRAMUSCULAR | Status: DC | PRN

## 2021-11-24 MED ORDER — MAGNESIUM SULFATE 4 GM/100ML IV SOLN
4.0000 g | Freq: Once | INTRAVENOUS | Status: AC
  Administered 2021-11-24: 09:00:00 4 g via INTRAVENOUS
  Filled 2021-11-24: qty 100

## 2021-11-24 MED ORDER — GLYCOPYRROLATE 0.2 MG/ML IJ SOLN: 0.2000 mg | INTRAMUSCULAR | Status: DC | PRN

## 2021-11-24 MED ORDER — HALOPERIDOL LACTATE 2 MG/ML PO CONC: 0.5000 mg | ORAL | Status: DC | PRN

## 2021-11-24 MED ORDER — POTASSIUM CHLORIDE 10 MEQ/100ML IV SOLN
10.0000 meq | INTRAVENOUS | Status: DC
  Administered 2021-11-24 (×2): 10 meq via INTRAVENOUS
  Filled 2021-11-24 (×3): qty 100

## 2021-11-24 MED ORDER — LORAZEPAM 1 MG PO TABS: 1.0000 mg | ORAL_TABLET | ORAL | Status: DC | PRN

## 2021-11-24 MED ORDER — ENOXAPARIN SODIUM 60 MG/0.6ML IJ SOSY
50.0000 mg | PREFILLED_SYRINGE | Freq: Two times a day (BID) | INTRAMUSCULAR | Status: DC
  Administered 2021-11-24: 10:00:00 50 mg via SUBCUTANEOUS
  Filled 2021-11-24: qty 0.6

## 2021-11-24 MED ORDER — MORPHINE SULFATE (CONCENTRATE) 10 MG/0.5ML PO SOLN: 5.0000 mg | ORAL | Status: DC | PRN

## 2021-11-24 MED ORDER — LORAZEPAM 2 MG/ML PO CONC: 1.0000 mg | ORAL | Status: DC | PRN

## 2021-11-24 MED ORDER — POTASSIUM CHLORIDE 2 MEQ/ML IV SOLN
INTRAVENOUS | Status: DC
  Filled 2021-11-24: qty 1000

## 2021-11-24 MED ORDER — MORPHINE SULFATE (PF) 2 MG/ML IV SOLN: 1.0000 mg | INTRAVENOUS | Status: DC | PRN

## 2021-11-24 MED ORDER — KCL-LACTATED RINGERS 20 MEQ/L IV SOLN
INTRAVENOUS | Status: DC
  Filled 2021-11-24: qty 1000

## 2021-11-24 MED ORDER — BIOTENE DRY MOUTH MT LIQD: 15.0000 mL | OROMUCOSAL | Status: DC | PRN

## 2021-11-24 NOTE — Progress Notes (Signed)
ANTICOAGULATION CONSULT NOTE  Pharmacy Consult for Enoxaparin Indication: pulmonary embolus  No Known Allergies  Patient Measurements: Weight: 51.2 kg (112 lb 14 oz)   Vital Signs: Temp: 98.3 F (36.8 C) (11/28 0828) Temp Source: Oral (11/28 0828) BP: 133/67 (11/28 0828) Pulse Rate: 98 (11/28 0828)  Labs: Recent Labs    11/22/21 0833 11/22/21 0914 11/23/21 0301 11/24/21 0153  HGB 14.8 15.3* 11.8* 10.1*  HCT 46.1* 45.0 35.0* 28.9*  PLT 312  --  235 189  CREATININE 1.99*  --  1.57* 0.99  CKTOTAL  --   --  966* 564*    Estimated Creatinine Clearance: 36.6 mL/min (by C-G formula based on SCr of 0.99 mg/dL).   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 Patient now unable to take medications by mouth. Will transition to enoxaparin for now.   Scr improving back to baseline, now stable at 0.99 (down from 1.99). Last dose apixaban 11/27 @ 10:00. CBC stable, h/h steadily decreased, attributed 2/2 dehydration/poor PO intake PTA. No s/sx bleeding reported.  Goal of Therapy:  Monitor platelets by anticoagulation protocol: Yes   Plan:  Start enoxaparin 50mg  twice daily being 11/28 @ 10:00  Continue to monitor H&H, platelets, and renal function Follow up improvement in PO status and plan to transition back to apixaban.     Thank you for allowing pharmacy to be a part of this patient's care.  Ardyth Harps, PharmD Clinical Pharmacist

## 2021-11-24 NOTE — Plan of Care (Signed)
  Problem: Health Behavior/Discharge Planning: Goal: Ability to manage health-related needs will improve Outcome: Not Progressing   Problem: Clinical Measurements: Goal: Will remain free from infection Outcome: Progressing   Problem: Coping: Goal: Level of anxiety will decrease Outcome: Progressing

## 2021-11-24 NOTE — Progress Notes (Signed)
Daily Progress Note   Patient Name: Nicole Ruiz       Date: 11/24/2021 DOB: 27-Jul-1941  Age: 80 y.o. MRN#: [id number] Attending Physician: Mercy Riding, MD Primary Care Physician: Bernerd Limbo, MD Admit Date: 11/22/2021  Reason for Consultation: Establishing goals of care, "Coming back to the hospital with fever decreased p.o. intake."   HPI/Patient Profile: 80 y.o. female  with past medical history of CVA, hypertension, hyperlipidemia, CKD, and dementia presented to ED from home after being found unresponsive by husband. The day prior husband reports patient had a fall off the toilet seat. Patient was admitted on 11/22/2021 with acute metabolic encephalopathy, fever secondary to UTI, AKI, fall at home, protein calorie malnutrition.   ED Course: Upon admission into the emergency department patient was seen to be febrile up to 100.6 F and all other vital signs maintained.  CT of the head and cervical spine did not note any acute abnormality labs significant for hemoglobin 15.3, BUN 30, creatinine 1.99, alkaline phosphatase 238, and lactic acid 2.6.  Venous pH was within normal limits with CO2 27.2.  Chest and pelvis x-ray did not note any acute abnormality.  Nursing had attempted that in and out cath but obtain nothing.  Patient had been given 1 L of normal saline IV fluids and started on empiric antibiotics of Rocephin.  Subjective: Received update from bedside RN. She reports that patient continues to refuse all medications and is not eating/drinking.    I met with husband/Nicole Ruiz in the 6N consult room. Created space and opportunity for him to express thoughts and feelings regarding patient's current medical situation. Acknowledged that it was very difficult hearing yesterday that his wife  is approaching EOL. Emotional support provided.  I reviewed the concept of a comfort path, emphasizing that this path involves de-escalating and stopping full scope medical interventions, allowing a natural course to occur. Discussed that the goal is comfort and dignity rather than prolonging life. [name] verbalizes that he doesn't want his wife to suffer.   Discussed transitioning to comfort care in house, and what that would look like--keeping her clean and dry, no labs, no artificial hydration or feeding, no antibiotics, minimizing of medications, comfort feeds, and medication for pain and dyspnea. Provided education that prognosis is typically less than 2 weeks when someone is not eating  or drinking. Reviewed the option to transfer to a residential hospice facility for a more peaceful setting at EOL.   [name] agrees to transition to full comfort care and to transfer to hospice facility - he states his preference for [facility].    Length of Stay: 2   Vital Signs: BP 133/67 (BP Location: Right Arm)   Pulse 98   Temp 98.3 F (36.8 C) (Oral)   Resp 16   Wt 51.2 kg   SpO2 98%   BMI 20.00 kg/m  SpO2: SpO2: 98 % O2 Device: O2 Device: Room Air O2 Flow Rate:     LBM: Last BM Date: 11/24/21 Baseline Weight: Weight: 57.7 kg Most recent weight: Weight: 51.2 kg       Palliative Assessment/Data: PPS 10-20%      Palliative Care Assessment & Plan   Assessment: - acute metabolic encephalopathy - severe dementia - fever secondary to suspected UTI - AKI/azotemia - fall at home, rhabdomyolysis - failure to thrive  Recommendations/Plan: Full comfort measures initiated DNR/DNI as previously documented Transfer to [facility] when bed becomes available - TOC consult placed Added orders for symptom management at EOL as well as discontinued orders that were not focused on comfort PMT will continue to follow   Symptom Management:  Morphine prn for pain or dyspnea Lorazepam  (ATIVAN) prn for anxiety Haloperidol (HALDOL) prn for agitation  Glycopyrrolate (ROBINUL) for excessive secretions Ondansetron (ZOFRAN) prn for nausea Polyvinyl alcohol (LIQUIFILM TEARS) prn for dry eyes Antiseptic oral rinse (BIOTENE) prn for dry mouth   Goals of Care and Additional Recommendations: Limitations on Scope of Treatment: Full Comfort Care  Code Status: DNR/DNI   Prognosis:  < 2 weeks  Discharge Planning: Hospice facility    Thank you for allowing the Palliative Medicine Team to assist in the care of this patient.   Total Time 35 minutes Prolonged Time Billed  no       Greater than 50%  of this time was spent counseling and coordinating care related to the above assessment and plan.  Lavena Bullion, NP  Please contact Palliative Medicine Team phone at [phone] for questions and concerns.

## 2021-11-24 NOTE — Progress Notes (Signed)
Speech Language Pathology Treatment: Dysphagia  Patient Details Name: Nicole Ruiz MRN: [id number] DOB: 20-Apr-1941 Today's Date: 11/24/2021 Time: 1025-1039 SLP Time Calculation (min) (ACUTE ONLY): 14 min  Assessment / Plan / Recommendation Clinical Impression  Pt was seen for therapy targeting swallowing goals. Pt awake and alert, however uncooperative with taking solid POs. SLP attempted to place dentures, however denture cream unavailable; observed pt with thin liquid from straw, noting no overt oral or pharyngeal phase impairments. When presented with Dys3 texture, pt refused POs and was thus not observed despite max verbal/tactile cues. Recommend continue with Dys3/thin liquid diet, administering medications crushed in puree. Pt requires no further ST services.   HPI HPI: 80 year old female admitted with decreased responsiveness after fall. Concerns for UTI, FTT, poor PO intake.  PMH of severe dementia, CVA, HTN, HLD, PE on Eliquis, and recent hospitalization for Pseudomonas UTI, T1-2 endplate compression fracture, dysphagia, distal esophageal ring, gastric ulcers and FTT returning with decreased responsiveness after she had accidental fall the day prior. She has had prior swallow assessements; most recent was October of this year with unremarkable findings.      SLP Plan  All goals met      Recommendations for follow up therapy are one component of a multi-disciplinary discharge planning process, led by the attending physician.  Recommendations may be updated based on patient status, additional functional criteria and insurance authorization.    Recommendations  Diet recommendations: Dysphagia 3 (mechanical soft);Thin liquid Liquids provided via: Straw Medication Administration: Crushed with puree Supervision: Staff to assist with self feeding                Oral Care Recommendations: Oral care BID Follow Up Recommendations: Skilled nursing-short term rehab (<3  hours/day) Assistance recommended at discharge: Frequent or constant Supervision/Assistance SLP Visit Diagnosis: Dysphagia, unspecified (R13.10) Plan: All goals met       GO             Dewitt Rota, SLP-Student    Dewitt Rota  11/24/2021, 11:17 AM

## 2021-11-24 NOTE — Progress Notes (Signed)
PROGRESS NOTE  Nicole Ruiz PPH:[id number] DOB: [birth year]   PCP: Tracey Harries, MD  Patient is from: Home.  Lives with husband who is caregiver.  Dependent for ADLs  DOA: 11/22/2021 LOS: 2  Chief complaints:  Chief Complaint  Patient presents with   Weakness     Brief Narrative / Interim history: 80 year old M with PMH of severe dementia, CVA, HTN, HLD, PE on Eliquis, and recent hospitalization for Pseudomonas UTI, T1-2 endplate compression fracture, dysphagia, distal esophageal ring, gastric ulcers and FTT returning with decreased responsiveness after she had accidental fall the day prior.  She also had poor p.o. intake and generalized weakness 4 days prior to this.   In ED, febrile to 100.6.  No leukocytosis.Cr 2.0 (baseline 1.0). CT head and cervical spine without acute finding.  Lactic acid 2.6.  Not able to get urine on I&O cath.  Empirically started on IV ceftriaxone and admitted for possible UTI, acute metabolic encephalopathy, FTT, AKI and accidental fall.  CK elevated to 1000.  Started on IV fluid.  CODE STATUS changed to DNR/DNI on 11/27 after discussion with patient husband. Palliative medicine consulted.  Eventually, she was transitioned to full comfort care on 11/28.   Subjective: Seen and examined earlier this morning.  Patient refused p.o. medication and p.o. intake.  Remains confused.  She reports hurting all over but not a reliable historian.  She is only oriented to self.  She thinks she is at [employer].  Objective: Vitals:   11/23/21 1554 11/23/21 2012 11/24/21 0510 11/24/21 0828  BP: 131/63 136/61 135/65 133/67  Pulse: 73 79 93 98  Resp: 19 15 19 16   Temp: 97.9 F (36.6 C) 98 F (36.7 C) 97.8 F (36.6 C) 98.3 F (36.8 C)  TempSrc: Oral Oral Oral Oral  SpO2: 99% 99% 96% 98%  Weight:   51.2 kg     Intake/Output Summary (Last 24 hours) at 11/24/2021 1728 Last data filed at 11/24/2021 1723 Gross per 24 hour  Intake 2107.43 ml  Output 0 ml  Net  2107.43 ml   Filed Weights   11/22/21 1300 11/23/21 0343 11/24/21 0510  Weight: 57.7 kg 48.3 kg 51.2 kg    Examination:  GENERAL: Frail and chronically ill-appearing. HEENT: MMM.  Vision and hearing grossly intact.  NECK: Supple.  No apparent JVD.  RESP: 98% on RA.  No IWOB.  Fair aeration bilaterally. CVS:  RRR. Heart sounds normal.  ABD/GI/GU: BS+. Abd soft, NTND.  MSK/EXT: Barely moves extremities.  Crying in pain when I try to move his arms or legs. SKIN: no apparent skin lesion or wound NEURO: Awake.  Oriented to self.  No apparent focal neuro deficit but limited exam. PSYCH: Calm.  No distress or agitation.  Procedures:  None  Microbiology summarized: COVID-19 and influenza PCR nonreactive. Blood cultures pending. Urine culture pending.  Assessment & Plan: End-of-life care -Appreciate help by palliative medicine -Full comfort care initiated.  Acute metabolic encephalopathy in patient with history of severe dementia: Multifactorial including UTI, dehydration and failure to thrive. Only oriented to self.  Does not follows commands.  Limited neuro exam.  CXR, CT head, CT cervical spine, TSH, B12 and ammonia unrevealing.  UA concerning for UTI.  Urine culture with Enterococcus faecium.  Received IV ampicillin -Now full comfort care.     UTI due to Enterococcus faecium:  Febrile to 100.6 on arrival.   No other SIRS criteria met.   -IV ampicillin 11/27-2011/28   Lactic acidosis: Could be from  dehydration or infection.  Resolved. -Continue IV fluid  AKI/azotemia: Likely prerenal from poor p.o. intake and dehydration.  She also has mild rhabdo.  Resolved Anion gap metabolic acidosis: Could be due to AKI or starvation ketosis.  Resolved  Dehydration Rhabdomyolysis/accidental fall at home/generalized weakness/history of T1-2 compression fracture  History of CVA-CT head negative.  No focal neurodeficit but very limited exam due to mental status. -Continue atorvastatin    Hypothyroidism: TSH within normal.   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   Distal esophageal ring/gastric ulcer  Hypokalemia: K3.4.  Severe protein calorie malnutrition/failure to thrive Body mass index is 20 kg/m. -Consult dietitian          DVT prophylaxis:    Code Status: DNR/DNI Family Communication: Updated patient's husband over the phone on 11/27. Level of care: Telemetry Medical Status is: Inpatient  Remains inpatient appropriate because: safe disposition/residential hospice       Consultants:  Palliative medicine   Sch Meds:  Scheduled Meds:  buPROPion  150 mg Oral Daily   FLUoxetine  40 mg Oral Daily   pantoprazole (PROTONIX) IV  40 mg Intravenous Q12H   propranolol  10 mg Oral BID   sodium chloride flush  3 mL Intravenous Q12H   Continuous Infusions:  ampicillin (OMNIPEN) IV 1 g (11/24/21 1717)   PRN Meds:.acetaminophen **OR** acetaminophen, antiseptic oral rinse, glycopyrrolate **OR** glycopyrrolate, [DISCONTINUED] haloperidol **OR** haloperidol lactate, LORazepam **OR** [DISCONTINUED] LORazepam **OR** LORazepam, morphine injection, ondansetron **OR** ondansetron (ZOFRAN) IV, polyvinyl alcohol  Antimicrobials: Anti-infectives (From admission, onward)    Start     Dose/Rate Route Frequency Ordered Stop   11/23/21 1800  ampicillin (OMNIPEN) 1 g in sodium chloride 0.9 % 100 mL IVPB        1 g 300 mL/hr over 20 Minutes Intravenous Every 6 hours 11/23/21 1556     11/23/21 1100  cefTRIAXone (ROCEPHIN) 1 g in sodium chloride 0.9 % 100 mL IVPB  Status:  Discontinued        1 g 200 mL/hr over 30 Minutes Intravenous Every 24 hours 11/22/21 1345 11/23/21 0730   11/23/21 0900  ceFEPIme (MAXIPIME) 2 g in sodium chloride 0.9 % 100 mL IVPB  Status:  Discontinued        2 g 200 mL/hr over 30 Minutes Intravenous Every 24 hours 11/23/21 0758 11/23/21 1556   11/22/21 1130   cefTRIAXone (ROCEPHIN) 1 g in sodium chloride 0.9 % 100 mL IVPB        1 g 200 mL/hr over 30 Minutes Intravenous  Once 11/22/21 1127 11/22/21 1253        I have personally reviewed the following labs and images: CBC: Recent Labs  Lab 11/22/21 0833 11/22/21 0914 11/23/21 0301 11/24/21 0153  WBC 10.2  --  9.2 6.9  NEUTROABS 7.8*  --   --   --   HGB 14.8 15.3* 11.8* 10.1*  HCT 46.1* 45.0 35.0* 28.9*  MCV 96.8  --  93.3 90.0  PLT 312  --  235 189   BMP &GFR Recent Labs  Lab 11/22/21 0833 11/22/21 0914 11/23/21 0301 11/24/21 0153  NA 139 139 141 141  K 3.8 3.7 3.4* 2.9*  CL 105  --  110 108  CO2 11*  --  15* 24  GLUCOSE 96  --  105* 131*  BUN 30*  --  29* 21  CREATININE 1.99*  --  1.57* 0.99  CALCIUM 8.5*  --  7.8* 7.2*  MG  --   --   --  1.2*  PHOS  --   --   --  1.2*   Estimated Creatinine Clearance: 36.6 mL/min (by C-G formula based on SCr of 0.99 mg/dL). Liver & Pancreas: Recent Labs  Lab 11/22/21 0833 11/24/21 0153  AST 26  --   ALT 21  --   ALKPHOS 238*  --   BILITOT 1.2  --   PROT 6.2*  --   ALBUMIN 2.8* 1.8*   No results for input(s): LIPASE, AMYLASE in the last 168 hours. Recent Labs  Lab 11/22/21 0833  AMMONIA <10   Diabetic: No results for input(s): HGBA1C in the last 72 hours. Recent Labs  Lab 11/22/21 0857  GLUCAP 93   Cardiac Enzymes: Recent Labs  Lab 11/23/21 0301 11/24/21 0153  CKTOTAL 966* 564*   No results for input(s): PROBNP in the last 8760 hours. Coagulation Profile: No results for input(s): INR, PROTIME in the last 168 hours. Thyroid Function Tests: Recent Labs    11/23/21 0301  TSH 3.295   Lipid Profile: No results for input(s): CHOL, HDL, LDLCALC, TRIG, CHOLHDL, LDLDIRECT in the last 72 hours. Anemia Panel: No results for input(s): VITAMINB12, FOLATE, FERRITIN, TIBC, IRON, RETICCTPCT in the last 72 hours. Urine analysis:    Component Value Date/Time   COLORURINE AMBER (A) 11/22/2021 0833   APPEARANCEUR  CLOUDY (A) 11/22/2021 0833   LABSPEC 1.020 11/22/2021 0833   PHURINE 5.0 11/22/2021 0833   GLUCOSEU NEGATIVE 11/22/2021 0833   HGBUR SMALL (A) 11/22/2021 0833   BILIRUBINUR SMALL (A) 11/22/2021 0833   KETONESUR 20 (A) 11/22/2021 0833   PROTEINUR 30 (A) 11/22/2021 0833   UROBILINOGEN 1.0 10/20/2015 1124   NITRITE NEGATIVE 11/22/2021 0833   LEUKOCYTESUR MODERATE (A) 11/22/2021 0833   Sepsis Labs: Invalid input(s): PROCALCITONIN, [place]  Microbiology: Recent Results (from the past 240 hour(s))  Culture, blood (single)     Status: None (Preliminary result)   Collection Time: 11/22/21  8:45 AM   Specimen: BLOOD  Result Value Ref Range Status   Specimen Description BLOOD LEFT ANTECUBITAL  Final   Special Requests   Final    BOTTLES DRAWN AEROBIC AND ANAEROBIC Blood Culture results may not be optimal due to an inadequate volume of blood received in culture bottles   Culture   Final    NO GROWTH 2 DAYS Performed at [facility], [place] [address], [place], [place] [address]    Report Status PENDING  Incomplete  Resp Panel by RT-PCR (Flu A&B, Covid) Nasopharyngeal Swab     Status: None   Collection Time: 11/22/21 10:07 AM   Specimen: Nasopharyngeal Swab; Nasopharyngeal(NP) swabs in vial transport medium  Result Value Ref Range Status   SARS Coronavirus 2 by RT PCR NEGATIVE NEGATIVE Final    Comment: (NOTE) SARS-CoV-2 target nucleic acids are NOT DETECTED.  The SARS-CoV-2 RNA is generally detectable in upper respiratory specimens during the acute phase of infection. The lowest concentration of SARS-CoV-2 viral copies this assay can detect is 138 copies/mL. A negative result does not preclude SARS-Cov-2 infection and should not be used as the sole basis for treatment or other patient management decisions. A negative result may occur with  improper specimen collection/handling, submission of specimen other than nasopharyngeal swab, presence of viral mutation(s) within  the areas targeted by this assay, and inadequate number of viral copies(<138 copies/mL). A negative result must be combined with clinical observations, patient history, and epidemiological information.  The expected result is Negative.  Fact Sheet for Patients:  EntrepreneurPulse.com.au  Fact Sheet for Healthcare Providers:  IncredibleEmployment.be  This test is no t yet approved or cleared by the [place] FDA and  has been authorized for detection and/or diagnosis of SARS-CoV-2 by FDA under an Emergency Use Authorization (EUA). This EUA will remain  in effect (meaning this test can be used) for the duration of the COVID-19 declaration under Section 564(b)(1) of the Act, 21 U.S.C.section 360bbb-3(b)(1), unless the authorization is terminated  or revoked sooner.       Influenza A by PCR NEGATIVE NEGATIVE Final   Influenza B by PCR NEGATIVE NEGATIVE Final    Comment: (NOTE) The Xpert Xpress SARS-CoV-2/FLU/RSV plus assay is intended as an aid in the diagnosis of influenza from Nasopharyngeal swab specimens and should not be used as a sole basis for treatment. Nasal washings and aspirates are unacceptable for Xpert Xpress SARS-CoV-2/FLU/RSV testing.  Fact Sheet for Patients: EntrepreneurPulse.com.au  Fact Sheet for Healthcare Providers: IncredibleEmployment.be  This test is not yet approved or cleared by the [place] FDA and has been authorized for detection and/or diagnosis of SARS-CoV-2 by FDA under an Emergency Use Authorization (EUA). This EUA will remain in effect (meaning this test can be used) for the duration of the COVID-19 declaration under Section 564(b)(1) of the Act, 21 U.S.C. section 360bbb-3(b)(1), unless the authorization is terminated or revoked.  Performed at [facility], [place] [address], [place], [place] [address]   Urine Culture     Status: Abnormal (Preliminary result)    Collection Time: 11/22/21  1:43 PM   Specimen: Urine, Catheterized  Result Value Ref Range Status   Specimen Description URINE, CATHETERIZED  Final   Special Requests NONE  Final   Culture (A)  Final    >=100,000 COLONIES/mL ENTEROCOCCUS FAECIUM SUSCEPTIBILITIES TO FOLLOW REPEATING Performed at [facility], [place] [address] [place], [place] [address]    Report Status PENDING  Incomplete    Radiology Studies: No results found.    Braydin Aloi T. [place]  If 7PM-7AM, please contact night-coverage [email or web address] 11/24/2021, 5:28 PM

## 2021-11-25 DIAGNOSIS — R627 Adult failure to thrive: Secondary | ICD-10-CM

## 2021-11-25 MED ORDER — ACETAMINOPHEN 325 MG PO TABS: 650.0000 mg | ORAL_TABLET | Freq: Four times a day (QID) | ORAL | Status: AC | PRN

## 2021-11-25 MED ORDER — GLYCOPYRROLATE 0.2 MG/ML IJ SOLN: 0.2000 mg | INTRAMUSCULAR | Status: AC | PRN

## 2021-11-25 MED ORDER — ONDANSETRON HCL 4 MG/2ML IJ SOLN
4.0000 mg | Freq: Four times a day (QID) | INTRAMUSCULAR | 0 refills | Status: AC | PRN
Start: 2021-11-25 — End: ?

## 2021-11-25 MED ORDER — MORPHINE SULFATE (PF) 2 MG/ML IV SOLN
1.0000 mg | INTRAVENOUS | 0 refills | Status: AC | PRN
Start: 2021-11-25 — End: ?

## 2021-11-25 MED ORDER — LORAZEPAM 2 MG/ML IJ SOLN
1.0000 mg | INTRAMUSCULAR | 0 refills | Status: AC | PRN
Start: 2021-11-25 — End: ?

## 2021-11-25 NOTE — Progress Notes (Addendum)
Report given to Tiffany, RN at [facility].

## 2021-11-25 NOTE — Discharge Summary (Signed)
Physician Discharge Summary  Nicole Ruiz QVZ:[id number] DOB: July 05, 1941 DOA: 11/22/2021  PCP: Bernerd Limbo, MD  Admit date: 11/22/2021 Discharge date: 11/25/2021 Admitted From: Home Disposition: Residential hospice at [facility]  Discharge Condition: Stable for transfer. CODE STATUS: DNR/DNI.  Hospital Course: 80 year old M with PMH of severe dementia, CVA, HTN, HLD, PE on Eliquis, and recent hospitalization for Pseudomonas UTI, T1-2 endplate compression fracture, dysphagia, distal esophageal ring, gastric ulcers and FTT returning with decreased responsiveness after she had accidental fall the day prior.  She also had poor p.o. intake and generalized weakness 4 days prior to this.    In ED, febrile to 100.6.  No leukocytosis.Cr 2.0 (baseline 1.0). CT head and cervical spine without acute finding.  Lactic acid 2.6.  Not able to get urine on I&O cath.  Empirically started on IV ceftriaxone and admitted for possible UTI, acute metabolic encephalopathy, FTT, AKI and accidental fall.  CK elevated to 1000.  Started on IV fluid.  CODE STATUS changed to DNR/DNI on 11/27 after discussion with patient husband. Palliative medicine consulted.  Eventually, she was transitioned to full comfort care on 11/28.  See individual problem list below for more on hospital course.  Discharge Diagnoses:  End-of-life care/DNR/SNI -Appreciate help by palliative medicine -Full comfort care initiated on 11/24/2021. -Complex   Acute metabolic encephalopathy in patient with history of severe dementia: Multifactorial including UTI, dehydration and failure to thrive. Only oriented to self.  Does not follows commands.  Limited neuro exam.  CXR, CT head, CT cervical spine, TSH, B12 and ammonia unrevealing.  UA concerning for UTI.  Urine culture with Enterococcus faecium.  Received IV ampicillin -Now full comfort care.     UTI due to Enterococcus faecium:  Febrile to 100.6 on arrival.   No other SIRS criteria met.    -IV ampicillin 11/27-2011/28   Lactic acidosis: Could be from dehydration or infection.  Resolved. -Continue IV fluid   AKI/azotemia: Likely prerenal from poor p.o. intake and dehydration.  She also has mild rhabdo.  Resolved Anion gap metabolic acidosis: Could be due to AKI or starvation ketosis.  Resolved   Dehydration Rhabdomyolysis/accidental fall at home/generalized weakness/history of T1-2 compression fracture   History of CVA-CT head negative.  No focal neurodeficit but very limited exam due to mental status. -Continue atorvastatin   Hypothyroidism: TSH within normal.   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   Distal esophageal ring/gastric ulcer   Hypokalemia: K   Severe protein calorie malnutrition/failure to thrive Body mass index is 20 kg/m.           Discharge Exam: Vitals:   11/23/21 2012 11/24/21 0510 11/24/21 0828 11/25/21 0754  BP: 136/61 135/65 133/67 (!) 141/82  Pulse: 79 93 98 74  Temp: 98 F (36.7 C) 97.8 F (36.6 C) 98.3 F (36.8 C) 98.2 F (36.8 C)  Resp: _0 Weight:  51.2 kg    SpO2: 99% 96% 98% 97%  TempSrc: Oral Oral Oral Oral  BMI (Calculated):  20       GENERAL: No apparent distress.  Nontoxic. HEENT: MMM.  Vision and hearing grossly intact.  NECK: Supple.  No apparent JVD.  RESP: On RA.  No IWOB.  Fair aeration bilaterally. CVS:  RRR. Heart sounds normal.  ABD/GI/GU: Bowel sounds present. Soft. Non tender.  MSK/EXT: Uncomfortable moving legs SKIN: no apparent skin lesion or wound NEURO: Awake.  Oriented to self. PSYCH: Calm.  No distress or agitation.  Discharge Instructions   Allergies as of 11/25/2021   No Known Allergies      Medication List     STOP taking these medications    ALPRAZolam 0.5 MG 24 hr tablet Commonly known as: Xanax XR   apixaban 5 MG Tabs tablet Commonly known as: ELIQUIS   atorvastatin 20 MG  tablet Commonly known as: LIPITOR   buPROPion 150 MG 24 hr tablet Commonly known as: WELLBUTRIN XL   donepezil 10 MG tablet Commonly known as: ARICEPT   Fish Oil 1000 MG Caps   FLUoxetine 40 MG capsule Commonly known as: PROZAC   levothyroxine 50 MCG tablet Commonly known as: SYNTHROID   memantine 10 MG tablet Commonly known as: NAMENDA   pantoprazole 40 MG tablet Commonly known as: PROTONIX   polyvinyl alcohol 1.4 % ophthalmic solution Commonly known as: LIQUIFILM TEARS   primidone 50 MG tablet Commonly known as: MYSOLINE   propranolol 10 MG tablet Commonly known as: INDERAL   UNABLE TO FIND   Vitamin D (Ergocalciferol) 1.25 MG (50000 UNIT) Caps capsule Commonly known as: DRISDOL   Vitamin D3 25 MCG (1000 UT) Caps       TAKE these medications    acetaminophen 325 MG tablet Commonly known as: TYLENOL Take 2 tablets (650 mg total) by mouth every 6 (six) hours as needed for mild pain (or Fever >/= 101).   glycopyrrolate 0.2 MG/ML injection Commonly known as: ROBINUL Inject 1 mL (0.2 mg total) into the skin every 4 (four) hours as needed (excessive secretions).   LORazepam 2 MG/ML injection Commonly known as: ATIVAN Inject 0.5 mLs (1 mg total) into the vein every 4 (four) hours as needed for anxiety.   morphine 2 MG/ML injection Inject 0.5-1 mLs (1-2 mg total) into the vein every 2 (two) hours as needed (or dyspnea).   ondansetron 4 MG/2ML Soln injection Commonly known as: ZOFRAN Inject 2 mLs (4 mg total) into the vein every 6 (six) hours as needed for nausea.        Consultations: Palliative medicine  Procedures/Studies:   DG Chest 2 View  Result Date: 11/04/2021 CLINICAL DATA:  Fall with pain EXAM: CHEST - 2 VIEW COMPARISON:  10/01/2021 FINDINGS: The heart size and mediastinal contours are within normal limits. Aortic atherosclerosis. Both lungs are clear. The visualized skeletal structures are unremarkable. Recording device over the left  chest. IMPRESSION: No active cardiopulmonary disease. Electronically Signed   By: Donavan Foil M.D.   On: 11/04/2021 18:55   DG Thoracic Spine 2 View  Result Date: 11/04/2021 CLINICAL DATA:  Fall with pain EXAM: THORACIC SPINE 2 VIEWS COMPARISON:  07/14/2017, CT 03/01/2021. FINDINGS: Mild scoliosis. Slight wedging of T11 is age indeterminate. Remaining vertebra demonstrate normal stature IMPRESSION: 1. Mild age indeterminate wedging of T11. 2. Scoliosis Electronically Signed   By: Donavan Foil M.D.   On: 11/04/2021 18:59   DG Lumbar Spine Complete  Result Date: 11/04/2021 CLINICAL DATA:  Fall with pain EXAM: LUMBAR SPINE - COMPLETE 4+ VIEW COMPARISON:  08/25/2017 FINDINGS: Mild scoliosis. 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 Vertebral body heights are maintained. Moderate disc space narrowing and degenerative change at L2-L3 and L5-S1. Facet degenerative changes of the lower lumbar spine. IMPRESSION: Scoliosis and degenerative changes.  No acute osseous abnormality Electronically Signed   By: Donavan Foil M.D.   On: 11/04/2021 18:57   CT HEAD WO CONTRAST  Result Date: 11/22/2021 CLINICAL DATA:  80 year old female with history of trauma  from a fall yesterday. Dementia. EXAM: CT HEAD WITHOUT CONTRAST CT CERVICAL SPINE WITHOUT CONTRAST TECHNIQUE: Multidetector CT imaging of the head and cervical spine was performed following the standard protocol without intravenous contrast. Multiplanar CT image reconstructions of the cervical spine were also generated. COMPARISON:  CT of the head and cervical spine November 04, 2021. FINDINGS: CT HEAD FINDINGS Brain: Moderate cerebral atrophy. Patchy and confluent areas of decreased attenuation are noted throughout the deep and periventricular white matter of the cerebral hemispheres bilaterally, compatible with chronic microvascular ischemic disease. Well-defined focus of low attenuation in the left cerebellar hemisphere, compatible with an old infarct. No evidence of  acute infarction, hemorrhage, hydrocephalus, extra-axial collection or mass lesion/mass effect. Vascular: No hyperdense vessel or unexpected calcification. Skull: Normal. Negative for fracture or focal lesion. Sinuses/Orbits: No acute finding. Other: None. CT CERVICAL SPINE FINDINGS Alignment: Normal. Skull base and vertebrae: No acute fracture. No primary bone lesion or focal pathologic process. Soft tissues and spinal canal: No prevertebral fluid or swelling. No visible canal hematoma. Disc levels: Multilevel degenerative disc disease, most pronounced at C4-C5, C5-C6 and C6-C7. Moderate multilevel facet arthropathy. Upper chest: Negative. Other: None. IMPRESSION: 1. No evidence of significant acute traumatic injury to the skull, brain or cervical spine. 2. Moderate cerebral atrophy with chronic microvascular ischemic changes in the cerebral white matter and old left cerebellar infarct, as above. 3. Multilevel degenerative disc disease and cervical spondylosis, as above. Electronically Signed   By: Vinnie Langton M.D.   On: 11/22/2021 10:17   CT Head Wo Contrast  Result Date: 11/04/2021 CLINICAL DATA:  Head trauma fell out of bed EXAM: CT HEAD WITHOUT CONTRAST CT CERVICAL SPINE WITHOUT CONTRAST TECHNIQUE: Multidetector CT imaging of the head and cervical spine was performed following the standard protocol without intravenous contrast. Multiplanar CT image reconstructions of the cervical spine were also generated. COMPARISON:  CT brain 10/01/2021, MRI 03/24/2018, CT neck 10/06/2021, CT chest 03/01/2021 FINDINGS: CT HEAD FINDINGS Brain: No acute territorial infarction, hemorrhage, or intracranial mass. Chronic left greater than right cerebellar infarcts. Small chronic infarcts within the right basal ganglial a and sub insula. Atrophy and chronic small vessel ischemic changes of the white matter. Stable ventricle size Vascular: No hyperdense vessels.  Carotid vascular calcification Skull: Normal. Negative for  fracture or focal lesion. Sinuses/Orbits: No acute finding. Other: None CT CERVICAL SPINE FINDINGS Alignment: Normal. Skull base and vertebrae: Chronic appearing superior endplate deformities at T1 and T2. Cervical vertebral bodies show normal stature and no fracture Soft tissues and spinal canal: No prevertebral fluid or swelling. No visible canal hematoma. Disc levels: Multilevel degenerative changes. Advanced disease at C4-C5, C5-C6 and C6-C7. Facet degenerative changes at multiple levels with foraminal narrowing Upper chest: Stable punctate nodule at the right apex Other: None IMPRESSION: 1. No CT evidence for acute intracranial abnormality. Atrophy and chronic small vessel ischemic change of the white matter. 2. Degenerative changes of the cervical spine. No acute osseous abnormality Electronically Signed   By: Donavan Foil M.D.   On: 11/04/2021 18:54   CT Cervical Spine Wo Contrast  Result Date: 11/22/2021 CLINICAL DATA:  80 year old female with history of trauma from a fall yesterday. Dementia. EXAM: CT HEAD WITHOUT CONTRAST CT CERVICAL SPINE WITHOUT CONTRAST TECHNIQUE: Multidetector CT imaging of the head and cervical spine was performed following the standard protocol without intravenous contrast. Multiplanar CT image reconstructions of the cervical spine were also generated. COMPARISON:  CT of the head and cervical spine November 04, 2021. FINDINGS: CT HEAD  FINDINGS Brain: Moderate cerebral atrophy. Patchy and confluent areas of decreased attenuation are noted throughout the deep and periventricular white matter of the cerebral hemispheres bilaterally, compatible with chronic microvascular ischemic disease. Well-defined focus of low attenuation in the left cerebellar hemisphere, compatible with an old infarct. No evidence of acute infarction, hemorrhage, hydrocephalus, extra-axial collection or mass lesion/mass effect. Vascular: No hyperdense vessel or unexpected calcification. Skull: Normal. Negative  for fracture or focal lesion. Sinuses/Orbits: No acute finding. Other: None. CT CERVICAL SPINE FINDINGS Alignment: Normal. Skull base and vertebrae: No acute fracture. No primary bone lesion or focal pathologic process. Soft tissues and spinal canal: No prevertebral fluid or swelling. No visible canal hematoma. Disc levels: Multilevel degenerative disc disease, most pronounced at C4-C5, C5-C6 and C6-C7. Moderate multilevel facet arthropathy. Upper chest: Negative. Other: None. IMPRESSION: 1. No evidence of significant acute traumatic injury to the skull, brain or cervical spine. 2. Moderate cerebral atrophy with chronic microvascular ischemic changes in the cerebral white matter and old left cerebellar infarct, as above. 3. Multilevel degenerative disc disease and cervical spondylosis, as above. Electronically Signed   By: Vinnie Langton M.D.   On: 11/22/2021 10:17   CT Cervical Spine Wo Contrast  Result Date: 11/04/2021 CLINICAL DATA:  Head trauma fell out of bed EXAM: CT HEAD WITHOUT CONTRAST CT CERVICAL SPINE WITHOUT CONTRAST TECHNIQUE: Multidetector CT imaging of the head and cervical spine was performed following the standard protocol without intravenous contrast. Multiplanar CT image reconstructions of the cervical spine were also generated. COMPARISON:  CT brain 10/01/2021, MRI 03/24/2018, CT neck 10/06/2021, CT chest 03/01/2021 FINDINGS: CT HEAD FINDINGS Brain: No acute territorial infarction, hemorrhage, or intracranial mass. Chronic left greater than right cerebellar infarcts. Small chronic infarcts within the right basal ganglial a and sub insula. Atrophy and chronic small vessel ischemic changes of the white matter. Stable ventricle size Vascular: No hyperdense vessels.  Carotid vascular calcification Skull: Normal. Negative for fracture or focal lesion. Sinuses/Orbits: No acute finding. Other: None CT CERVICAL SPINE FINDINGS Alignment: Normal. Skull base and vertebrae: Chronic appearing superior  endplate deformities at T1 and T2. Cervical vertebral bodies show normal stature and no fracture Soft tissues and spinal canal: No prevertebral fluid or swelling. No visible canal hematoma. Disc levels: Multilevel degenerative changes. Advanced disease at C4-C5, C5-C6 and C6-C7. Facet degenerative changes at multiple levels with foraminal narrowing Upper chest: Stable punctate nodule at the right apex Other: None IMPRESSION: 1. No CT evidence for acute intracranial abnormality. Atrophy and chronic small vessel ischemic change of the white matter. 2. Degenerative changes of the cervical spine. No acute osseous abnormality Electronically Signed   By: Donavan Foil M.D.   On: 11/04/2021 18:54   US RENAL  Result Date: 11/22/2021 CLINICAL DATA:  Acute kidney injury. EXAM: RENAL / URINARY TRACT ULTRASOUND COMPLETE COMPARISON:  CT, 10/08/2021 FINDINGS: Right Kidney: Renal measurements: 7.9 x 4.3 x 3.7 cm = volume: 65.6 mL. Diffuse renal cortical thinning. Mild increased renal parenchymal echogenicity., no mass or stone. No hydronephrosis. Left Kidney: Renal measurements: 9.1 x 4.1 x 3.1 cm = volume: 60.7 mL. Diffuse renal cortical thinning with increased renal parenchymal echogenicity. Cyst arises from the upper pole, 3.4 x 3.2 x 3.4 cm. No other masses, no stones and no hydronephrosis. Bladder: Appears normal for degree of bladder distention. Other: None. IMPRESSION: 1. No acute findings. 2. Bilateral small kidneys with diffuse cortical thinning and increased renal parenchymal echogenicity consistent with medical renal disease. Left upper pole renal cyst. No hydronephrosis. Electronically  Signed   By: Lajean Manes M.D.   On: 11/22/2021 13:07   DG Pelvis Portable  Result Date: 11/22/2021 CLINICAL DATA:  Altered mental status EXAM: PORTABLE PELVIS 1-2 VIEWS COMPARISON:  10/08/2021 CT FINDINGS: There is no evidence of pelvic fracture or diastasis. No pelvic bone lesions are seen. Generalized osteopenia. Right  femoral nail IMPRESSION: No acute finding. Electronically Signed   By: Jorje Guild M.D.   On: 11/22/2021 09:12   DG Chest Portable 1 View  Result Date: 11/22/2021 CLINICAL DATA:  80 year old female with history of altered mental status. Dementia at baseline. EXAM: PORTABLE CHEST 1 VIEW COMPARISON:  Chest x-ray 11/04/2021. FINDINGS: Lung volumes are normal. No consolidative airspace disease. No pleural effusions. No pneumothorax. No pulmonary nodule or mass noted. Pulmonary vasculature and the cardiomediastinal silhouette are within normal limits. Atherosclerosis in the thoracic aorta. Electronic device projecting over the lower left hemithorax, likely an implantable loop recorder. IMPRESSION: 1.  No radiographic evidence of acute cardiopulmonary disease. 2. Aortic atherosclerosis. Electronically Signed   By: Vinnie Langton M.D.   On: 11/22/2021 08:58   DG Knee Complete 4 Views Right  Result Date: 11/04/2021 CLINICAL DATA:  Fall with knee pain EXAM: RIGHT KNEE - COMPLETE 4+ VIEW COMPARISON:  Femur radiograph 10/20/2015 FINDINGS: Status post knee replacement. Prior intramedullary rodding and distal screw fixation of the femur. Old distal femoral fracture deformity. No acute displaced fracture or malalignment. IMPRESSION: Postsurgical changes of the right knee and distal femur. No acute osseous abnormality Electronically Signed   By: Donavan Foil M.D.   On: 11/04/2021 19:00       The results of significant diagnostics from this hospitalization (including imaging, microbiology, ancillary and laboratory) are listed below for reference.     Microbiology: Recent Results (from the past 240 hour(s))  Culture, blood (single)     Status: None (Preliminary result)   Collection Time: 11/22/21  8:45 AM   Specimen: BLOOD  Result Value Ref Range Status   Specimen Description BLOOD LEFT ANTECUBITAL  Final   Special Requests   Final    BOTTLES DRAWN AEROBIC AND ANAEROBIC Blood Culture results may not  be optimal due to an inadequate volume of blood received in culture bottles   Culture   Final    NO GROWTH 3 DAYS Performed at [facility], [place] [address], [place], [place] [address]    Report Status PENDING  Incomplete  Resp Panel by RT-PCR (Flu A&B, Covid) Nasopharyngeal Swab     Status: None   Collection Time: 11/22/21 10:07 AM   Specimen: Nasopharyngeal Swab; Nasopharyngeal(NP) swabs in vial transport medium  Result Value Ref Range Status   SARS Coronavirus 2 by RT PCR NEGATIVE NEGATIVE Final    Comment: (NOTE) SARS-CoV-2 target nucleic acids are NOT DETECTED.  The SARS-CoV-2 RNA is generally detectable in upper respiratory specimens during the acute phase of infection. The lowest concentration of SARS-CoV-2 viral copies this assay can detect is 138 copies/mL. A negative result does not preclude SARS-Cov-2 infection and should not be used as the sole basis for treatment or other patient management decisions. A negative result may occur with  improper specimen collection/handling, submission of specimen other than nasopharyngeal swab, presence of viral mutation(s) within the areas targeted by this assay, and inadequate number of viral copies(<138 copies/mL). A negative result must be combined with clinical observations, patient history, and epidemiological information. The expected result is Negative.  Fact Sheet for Patients:  EntrepreneurPulse.com.au  Fact Sheet for Healthcare  Providers:  IncredibleEmployment.be  This test is no t yet approved or cleared by the [place] and  has been authorized for detection and/or diagnosis of SARS-CoV-2 by FDA under an Emergency Use Authorization (EUA). This EUA will remain  in effect (meaning this test can be used) for the duration of the COVID-19 declaration under Section 564(b)(1) of the Act, 21 U.S.C.section 360bbb-3(b)(1), unless the authorization is terminated  or revoked sooner.        Influenza A by PCR NEGATIVE NEGATIVE Final   Influenza B by PCR NEGATIVE NEGATIVE Final    Comment: (NOTE) The Xpert Xpress SARS-CoV-2/FLU/RSV plus assay is intended as an aid in the diagnosis of influenza from Nasopharyngeal swab specimens and should not be used as a sole basis for treatment. Nasal washings and aspirates are unacceptable for Xpert Xpress SARS-CoV-2/FLU/RSV testing.  Fact Sheet for Patients: EntrepreneurPulse.com.au  Fact Sheet for Healthcare Providers: IncredibleEmployment.be  This test is not yet approved or cleared by the [place] FDA and has been authorized for detection and/or diagnosis of SARS-CoV-2 by FDA under an Emergency Use Authorization (EUA). This EUA will remain in effect (meaning this test can be used) for the duration of the COVID-19 declaration under Section 564(b)(1) of the Act, 21 U.S.C. section 360bbb-3(b)(1), unless the authorization is terminated or revoked.  Performed at [facility], [place] [address] [place], [place] [address]   Urine Culture     Status: Abnormal (Preliminary result)   Collection Time: 11/22/21  1:43 PM   Specimen: Urine, Catheterized  Result Value Ref Range Status   Specimen Description URINE, CATHETERIZED  Final   Special Requests NONE  Final   Culture (A)  Final    >=100,000 COLONIES/mL ENTEROCOCCUS FAECIUM SUSCEPTIBILITIES TO FOLLOW REPEATING Performed at [facility], [place] [address] [place], [place] [address]    Report Status PENDING  Incomplete     Labs:  CBC: Recent Labs  Lab 11/22/21 0833 11/22/21 0914 11/23/21 0301 11/24/21 0153  WBC 10.2  --  9.2 6.9  NEUTROABS 7.8*  --   --   --   HGB 14.8 15.3* 11.8* 10.1*  HCT 46.1* 45.0 35.0* 28.9*  MCV 96.8  --  93.3 90.0  PLT 312  --  235 189   BMP &GFR Recent Labs  Lab 11/22/21 0833 11/22/21 0914 11/23/21 0301 11/24/21 0153  NA 139 139 141 141  K 3.8 3.7 3.4* 2.9*  CL 105  --  110  108  CO2 11*  --  15* 24  GLUCOSE 96  --  105* 131*  BUN 30*  --  29* 21  CREATININE 1.99*  --  1.57* 0.99  CALCIUM 8.5*  --  7.8* 7.2*  MG  --   --   --  1.2*  PHOS  --   --   --  1.2*   Estimated Creatinine Clearance: 36.6 mL/min (by C-G formula based on SCr of 0.99 mg/dL). Liver & Pancreas: Recent Labs  Lab 11/22/21 0833 11/24/21 0153  AST 26  --   ALT 21  --   ALKPHOS 238*  --   BILITOT 1.2  --   PROT 6.2*  --   ALBUMIN 2.8* 1.8*   No results for input(s): LIPASE, AMYLASE in the last 168 hours. Recent Labs  Lab 11/22/21 0833  AMMONIA <10   Diabetic: No results for input(s): HGBA1C in the last 72 hours. Recent Labs  Lab 11/22/21 0857  GLUCAP 93   Cardiac  Enzymes: Recent Labs  Lab 11/23/21 0301 11/24/21 0153  CKTOTAL 966* 564*   No results for input(s): PROBNP in the last 8760 hours. Coagulation Profile: No results for input(s): INR, PROTIME in the last 168 hours. Thyroid Function Tests: Recent Labs    11/23/21 0301  TSH 3.295   Lipid Profile: No results for input(s): CHOL, HDL, LDLCALC, TRIG, CHOLHDL, LDLDIRECT in the last 72 hours. Anemia Panel: No results for input(s): VITAMINB12, FOLATE, FERRITIN, TIBC, IRON, RETICCTPCT in the last 72 hours. Urine analysis:    Component Value Date/Time   COLORURINE AMBER (A) 11/22/2021 0833   APPEARANCEUR CLOUDY (A) 11/22/2021 0833   LABSPEC 1.020 11/22/2021 0833   PHURINE 5.0 11/22/2021 0833   GLUCOSEU NEGATIVE 11/22/2021 0833   HGBUR SMALL (A) 11/22/2021 0833   BILIRUBINUR SMALL (A) 11/22/2021 0833   KETONESUR 20 (A) 11/22/2021 0833   PROTEINUR 30 (A) 11/22/2021 0833   UROBILINOGEN 1.0 10/20/2015 1124   NITRITE NEGATIVE 11/22/2021 0833   LEUKOCYTESUR MODERATE (A) 11/22/2021 0833   Sepsis Labs: Invalid input(s): PROCALCITONIN, LACTICIDVEN   Time coordinating discharge: 45 minutes  SIGNED:  Mercy Riding, MD  [facility] 11/25/2021, 4:06 PM

## 2021-11-25 NOTE — Progress Notes (Signed)
PT Cancellation Note  Patient Details Name: LUWANNA BROSSMAN MRN: [id number] DOB: [birth year]   Cancelled Treatment:    Reason Eval/Treat Not Completed: Other (comment); noted initiation of comfort measures per Palliative team.  PT signing off.   Reginia Naas 11/25/2021, 1:39 PM Magda Kiel, PT Acute Rehabilitation Services Pager:[phone] [phone] 11/25/2021

## 2021-11-25 NOTE — TOC Transition Note (Addendum)
Transition of Care [facility]) - CM/SW Discharge Note   Patient Details  Name: KAIAH HOSEA MRN: [id number] Date of Birth: Feb 15, 1941  Transition of Care [facility]) CM/SW Contact:  Emeterio Reeve, LCSW Phone Number: 11/25/2021, 12:29 PM   Clinical Narrative:      Per MD patient ready for DC to [facility]. RN, patient, patient's family, and facility notified of DC. Discharge Summary. DC packet on chart. Ambulance transport requested for patient.    RN to call report to [phone].   CSW will sign off for now as social work intervention is no longer needed. Please consult [place] again if new needs arise.   Final next level of care: [place] Barriers to Discharge: Barriers Resolved   Patient Goals and CMS Choice        Discharge Placement              Patient chooses bed at: Other - please specify in the comment section below: ([facility]) Patient to be transferred to facility by: [place] Name of family member notified: Husband Patient and family notified of of transfer: 11/25/21  Discharge Plan and Services                                     Social Determinants of Health (SDOH) Interventions     Readmission Risk Interventions No flowsheet data found.   Emeterio Reeve, LCSW Clinical Social Worker

## 2021-11-25 NOTE — Progress Notes (Addendum)
[facility]) Hospital Liaison Note  Received request from [place] for family interest in [facility] with request for transfer today.  Chart reviewed and eligibility confirmed. Spoke with patient's husband [name] to confirm interest and explain services. Family agreeable to transfer today. TOC aware. Unit RN please call report to [phone] to patient leaving the unit.   TOC please arrange transport once you have received confirmation that consents are complete.   Please call with any questions or concerns. Thank you.   Zollie Pee [facility] Liaison  [phone] Dorris Fetch [facility] Liaison  [phone]

## 2021-11-25 NOTE — Progress Notes (Signed)
Daily Progress Note   Patient Name: BLESSIN KANNO       Date: 11/25/2021 DOB: 11/02/1941  Age: 80 y.o. MRN#: [id number] Attending Physician: Mercy Riding, MD Primary Care Physician: Bernerd Limbo, MD Admit Date: 11/22/2021  Reason for Consultation/Follow-up: end of life care  Subjective: Patient appears comfortable. She arouses easily to voice. She has no acute complaints. No non-verbal signs of pain or discomfort noted. Respirations are even and unlabored. No excessive respiratory secretions noted. No family present at bedside currently.   Patient has been accepted at [facility] and they do have a bed today.    Length of Stay: 3  Current Medications: Scheduled Meds:  . buPROPion  150 mg Oral Daily  . FLUoxetine  40 mg Oral Daily  . propranolol  10 mg Oral BID  . sodium chloride flush  3 mL Intravenous Q12H    PRN Meds: acetaminophen **OR** acetaminophen, antiseptic oral rinse, glycopyrrolate **OR** glycopyrrolate, [DISCONTINUED] haloperidol **OR** haloperidol lactate, LORazepam **OR** [DISCONTINUED] LORazepam **OR** LORazepam, morphine injection, ondansetron **OR** ondansetron (ZOFRAN) IV, polyvinyl alcohol           Vital Signs: BP (!) 141/82 (BP Location: Right Arm)   Pulse 74   Temp 98.2 F (36.8 C) (Oral)   Resp 15   Wt 51.2 kg   SpO2 97%   BMI 20.00 kg/m  SpO2: SpO2: 97 % O2 Device: O2 Device: Room Air O2 Flow Rate:     LBM: Last BM Date: 11/24/21 Baseline Weight: Weight: 57.7 kg Most recent weight: Weight: 51.2 kg       Palliative Assessment/Data: PPS 10%      Palliative Care Assessment & Plan   HPI/Patient Profile: 80 y.o. female  with past medical history of CVA, hypertension, hyperlipidemia, CKD, and dementia presented to ED from home after being  found unresponsive by husband. The day prior husband reports patient had a fall off the toilet seat. Patient was admitted on 11/22/2021 with acute metabolic encephalopathy, fever secondary to UTI, AKI, fall at home, protein calorie malnutrition.  Assessment: - acute metabolic encephalopathy - severe dementia - fever secondary to suspected UTI - AKI/azotemia - fall at home, rhabdomyolysis - failure to thrive   Recommendations/Plan: Full comfort measures  DNR/DNI as previously documented PRN medications are available for symptom management at EOL Pending discharge  to [place] and Additional Recommendations: Limitations on Scope of Treatment: Full Comfort Care   Prognosis:  < 2 weeks  Discharge Planning: Hospice facility   Thank you for allowing the Palliative Medicine Team to assist in the care of this patient.   Total Time 16 minutes Prolonged Time Billed  no       Greater than 50%  of this time was spent counseling and coordinating care related to the above assessment and plan.  Lavena Bullion, NP  Please contact Palliative Medicine Team phone at [phone] for questions and concerns.

## 2021-11-26 LAB — URINE CULTURE: Culture: >=100000 — AB

## 2021-11-27 LAB — CULTURE, BLOOD (SINGLE): Culture: NO GROWTH

## 2021-12-28 DEATH — deceased

## 2022-06-17 IMAGING — DX DG LUMBAR SPINE COMPLETE 4+V
6 series · 6 of 6 positions shown · non-contrast
Comparison: 08/25/2017

CLINICAL DATA: Fall with pain

EXAM:
LUMBAR SPINE - COMPLETE 4+ VIEW

[l-spine ap]
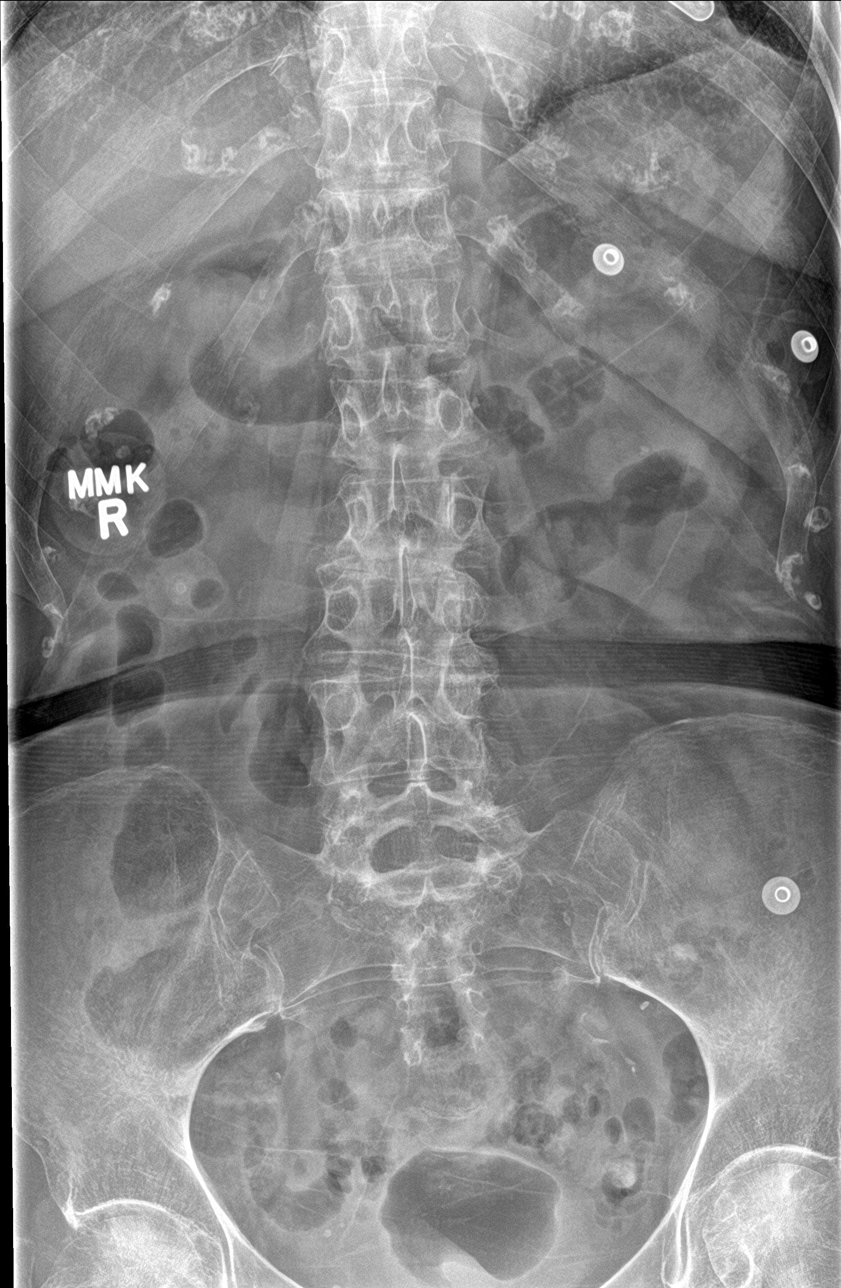

[l-spine obl (1 of 3)]
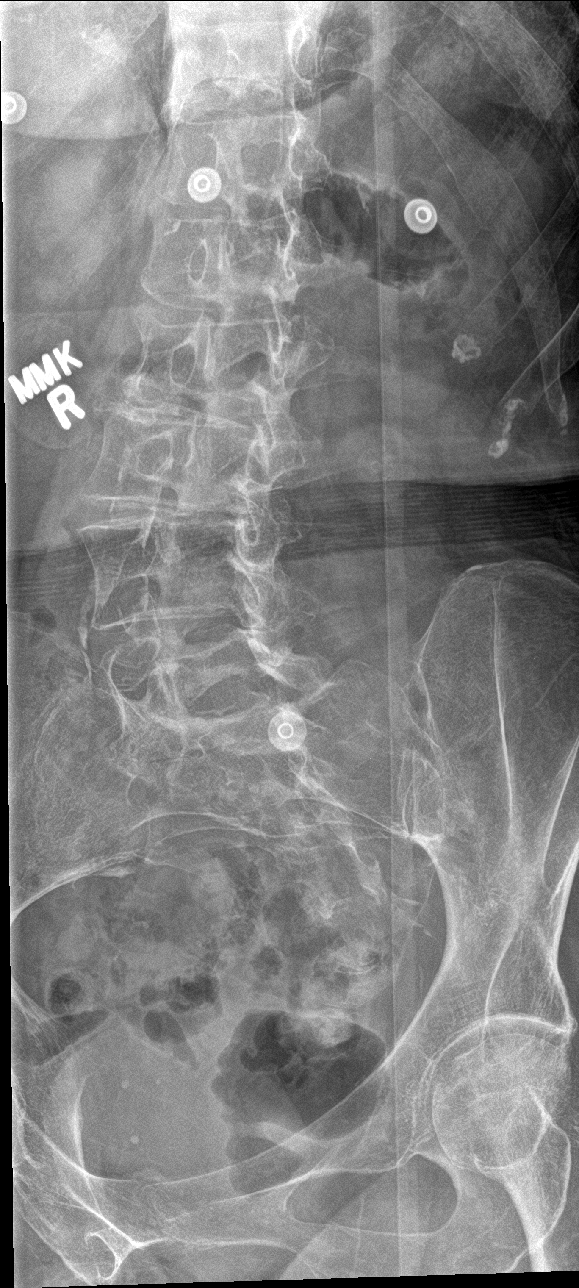

[l-spine obl (2 of 3)]
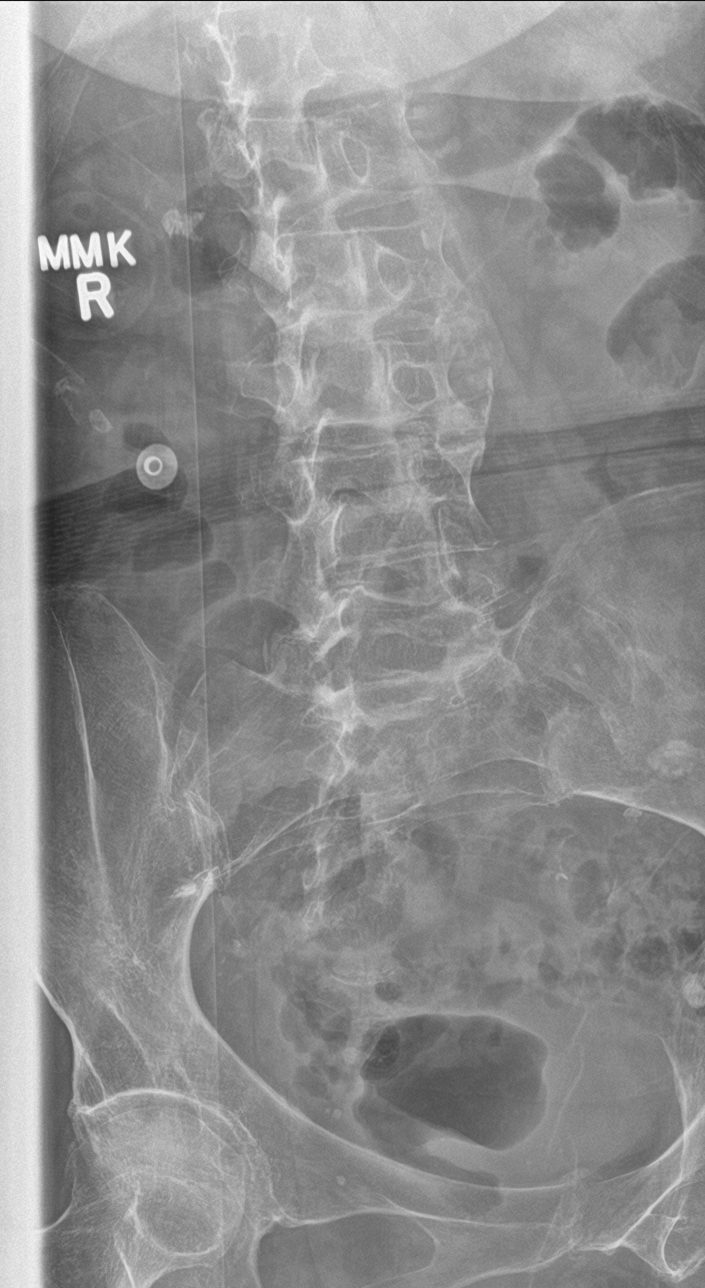

[l-spine lat]
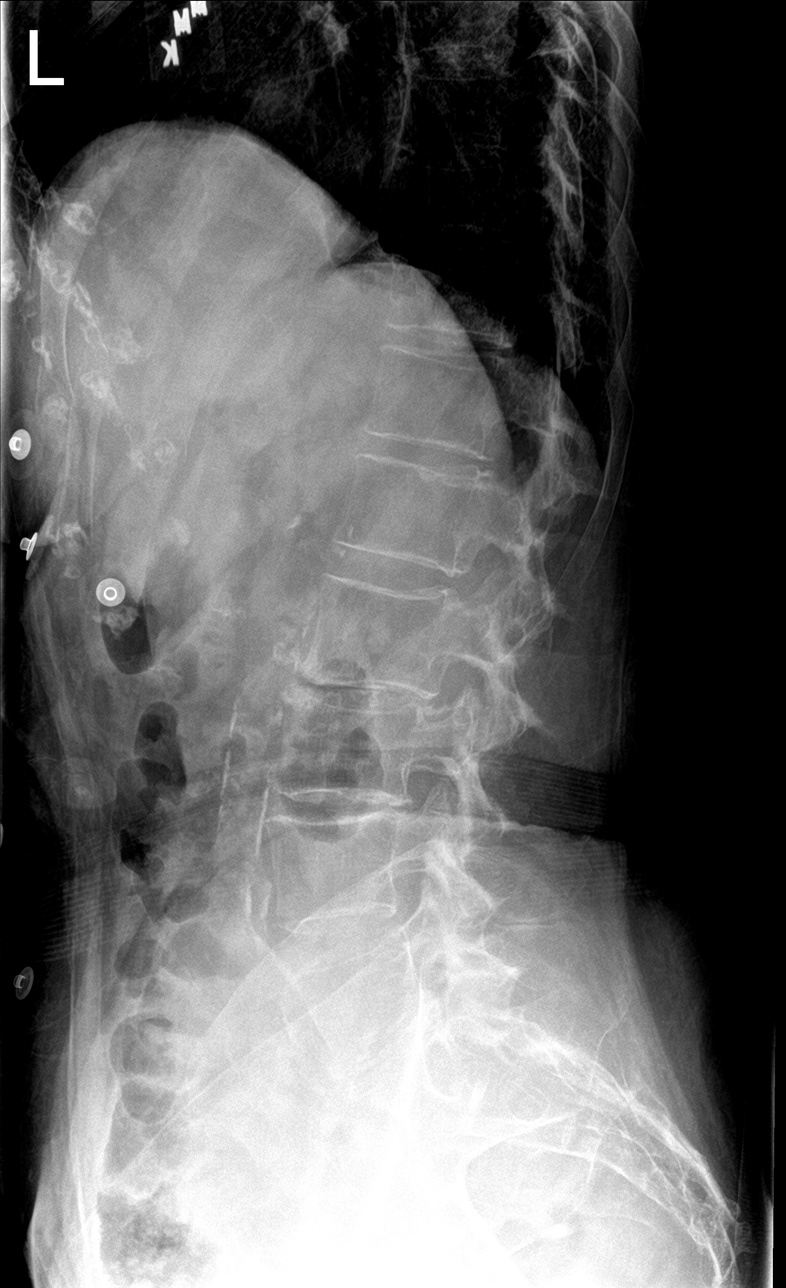

[l-spine spot]
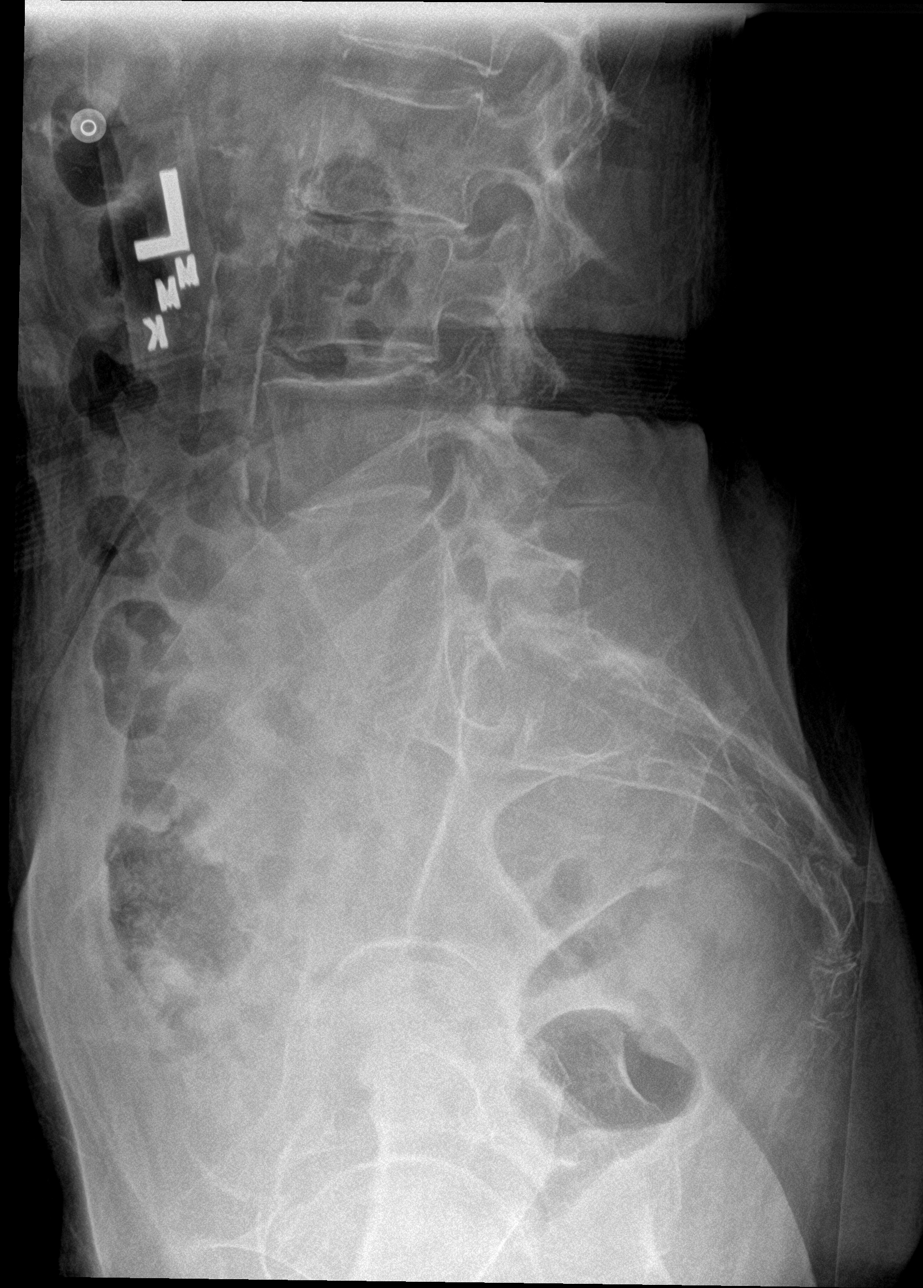

[l-spine obl (3 of 3)]
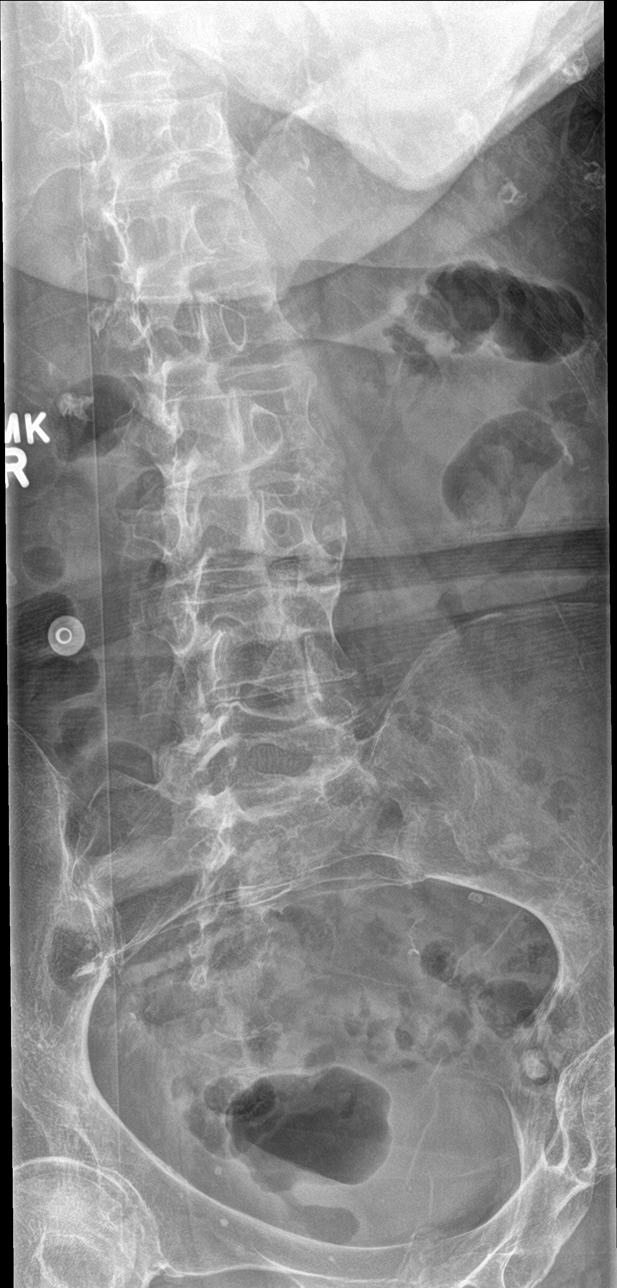

[6 of 6 positions shown; findings below may reference images not displayed]

FINDINGS: Mild scoliosis. Hypoplastic ribs are summed at T12. Vertebral body
heights are maintained. Moderate disc space narrowing and
degenerative change at L2-L3 and L5-S1. Facet degenerative changes
of the lower lumbar spine.
IMPRESSION: Scoliosis and degenerative changes.  No acute osseous abnormality

## 2022-06-17 IMAGING — DX DG KNEE COMPLETE 4+V*R*
4 series · 4 of 4 positions shown · non-contrast
Comparison: Femur radiograph 10/20/2015

CLINICAL DATA: Fall with knee pain

EXAM:
RIGHT KNEE - COMPLETE 4+ VIEW

[knee ap]
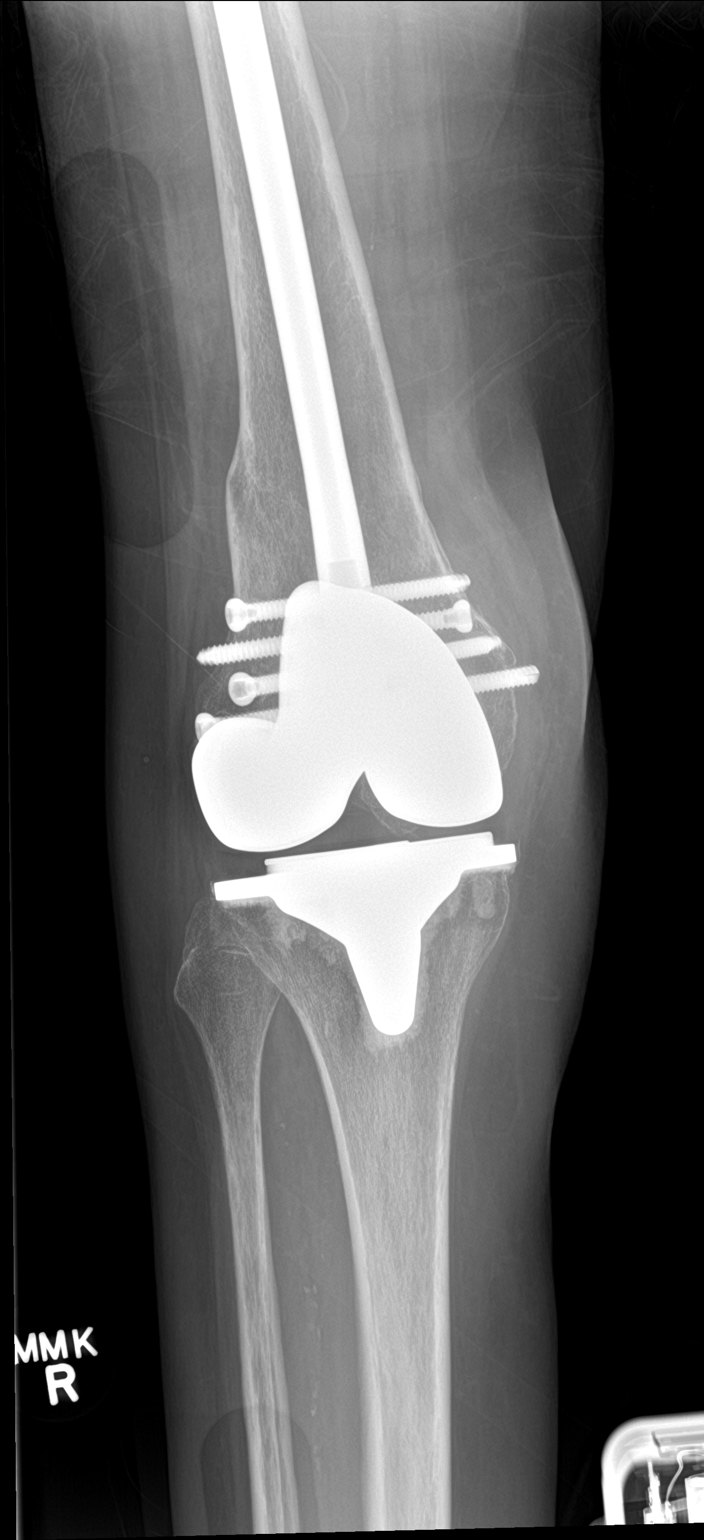

[knee lat]
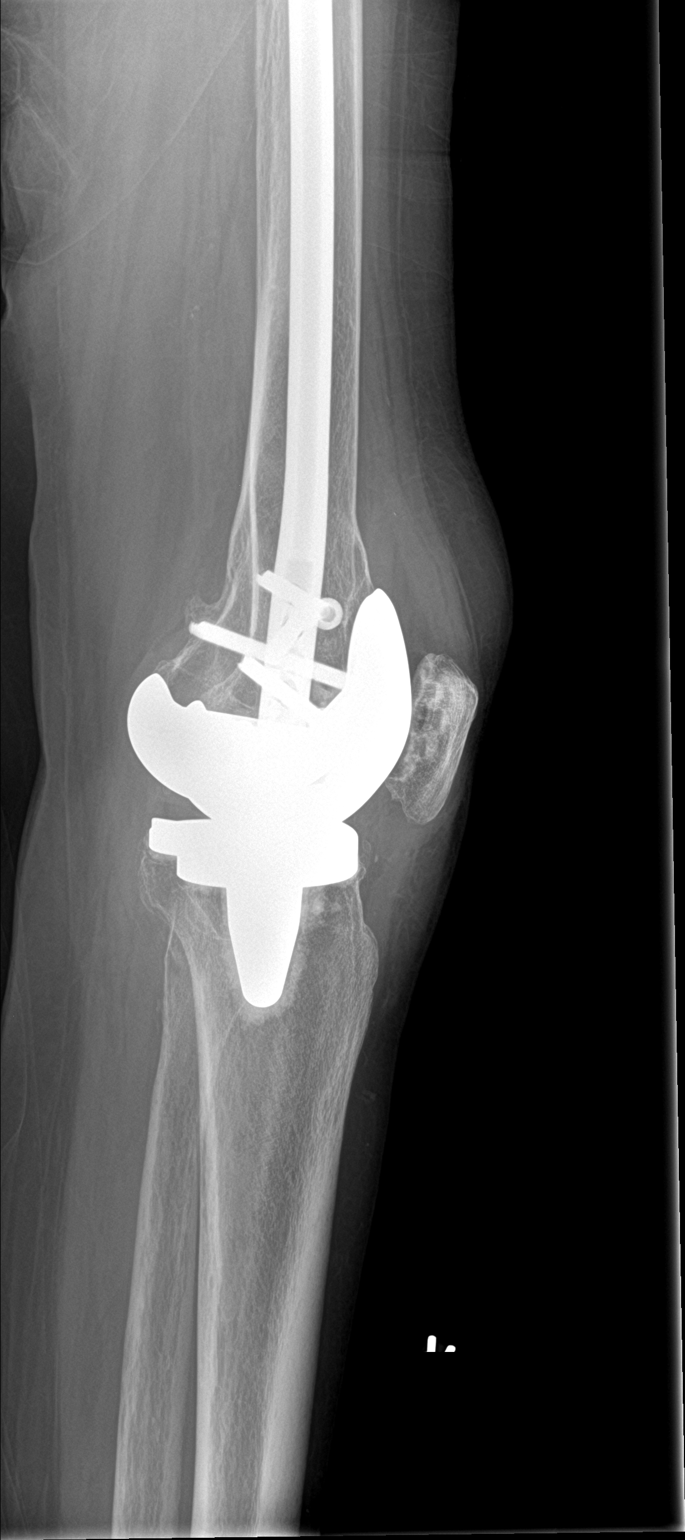

[knee obl (1 of 2)]
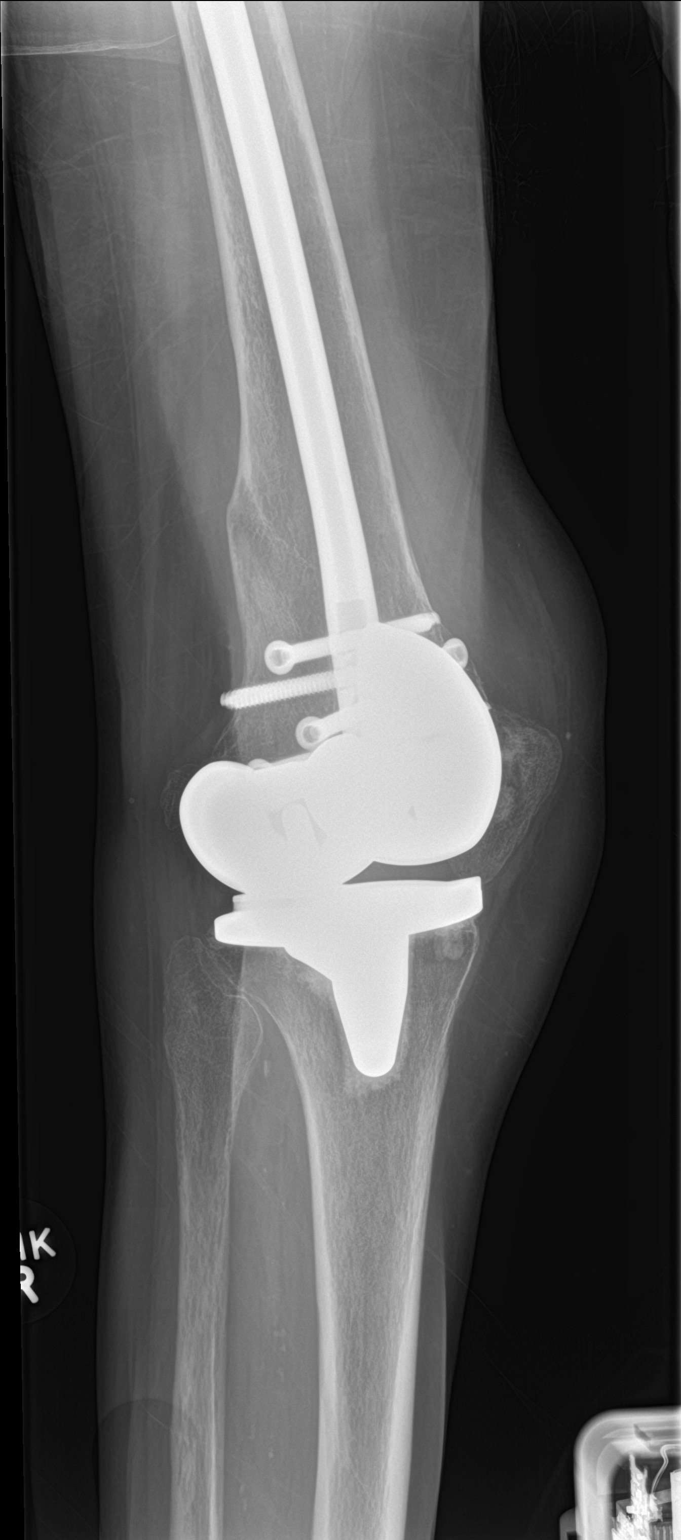

[knee obl (2 of 2)]
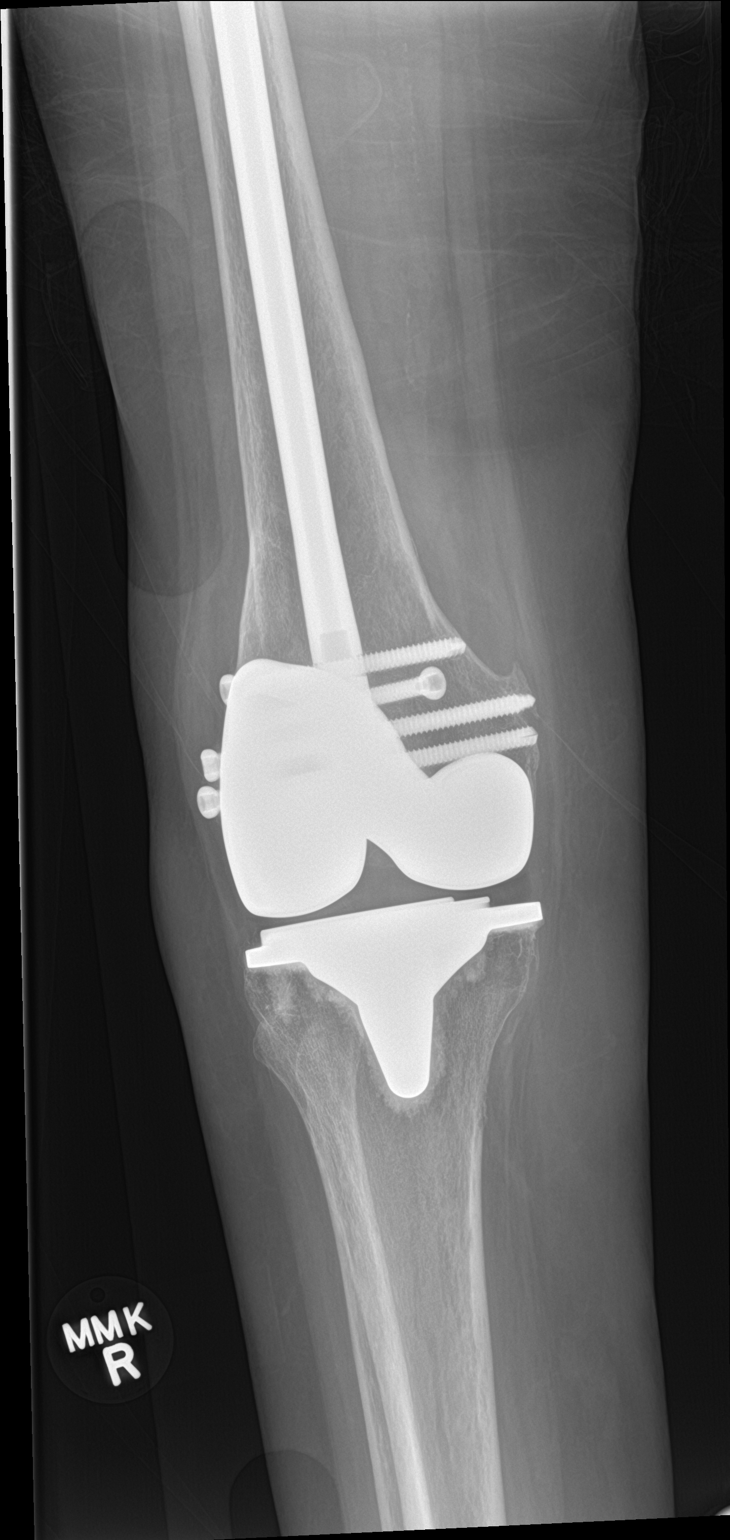

[4 of 4 positions shown; findings below may reference images not displayed]

FINDINGS: Status post knee replacement. Prior intramedullary rodding and
distal screw fixation of the femur. Old distal femoral fracture
deformity. No acute displaced fracture or malalignment.
IMPRESSION: Postsurgical changes of the right knee and distal femur. No acute
osseous abnormality

## 2022-06-17 IMAGING — CT CT HEAD W/O CM
4 series · 16 of 47 positions shown, 18 images · non-contrast
Comparison: CT brain 10/01/2021, MRI 03/24/2018, CT neck
10/06/2021, CT chest 03/01/2021

CLINICAL DATA: Head trauma fell out of bed

EXAM:
CT HEAD WITHOUT CONTRAST
CT CERVICAL SPINE WITHOUT CONTRAST
TECHNIQUE: Multidetector CT imaging of the head and cervical spine was
performed following the standard protocol without intravenous
contrast. Multiplanar CT image reconstructions of the cervical spine
were also generated.

[Series 3: head without · axial · non-contrast · 0.43mm/px · z∈[-144,-29]mm · 7 of 31 slices shown, 9 images]
[im 4/31  brain]
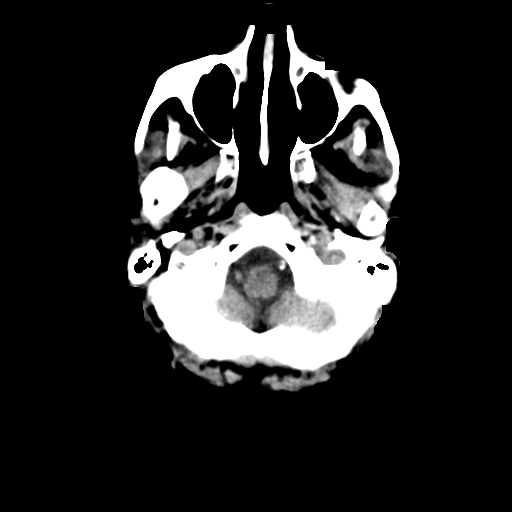
[im 4/31  bone]
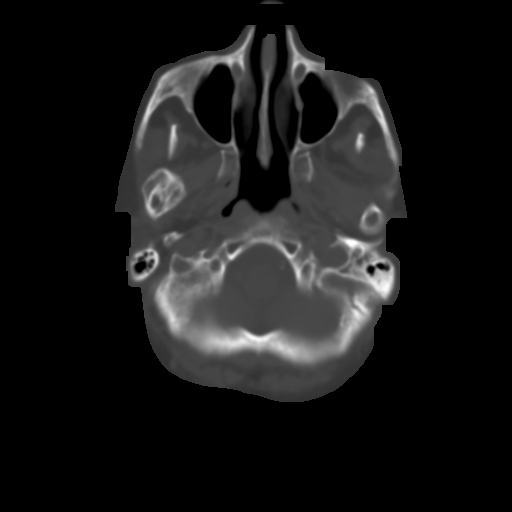
[im 8/31  brain]
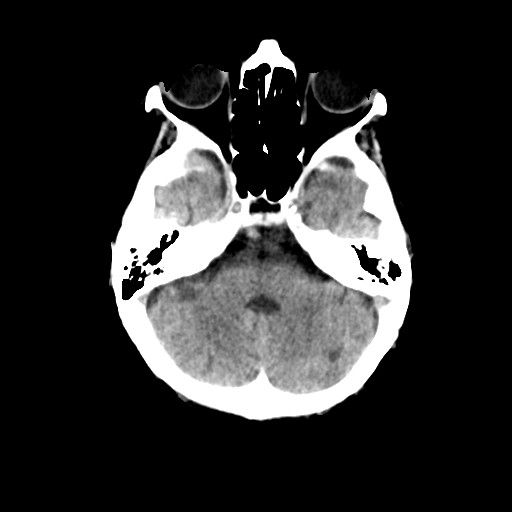
[im 12/31  brain]
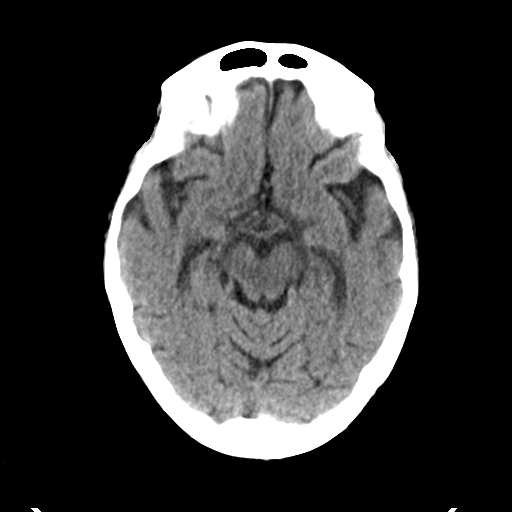
[im 16/31  brain]
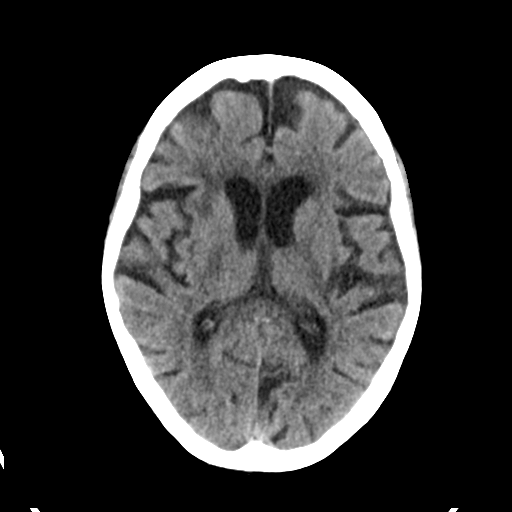
[im 19/31  brain]
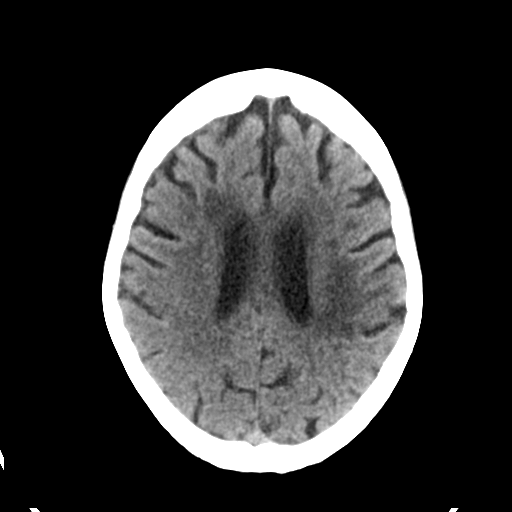
[im 19/31  bone]
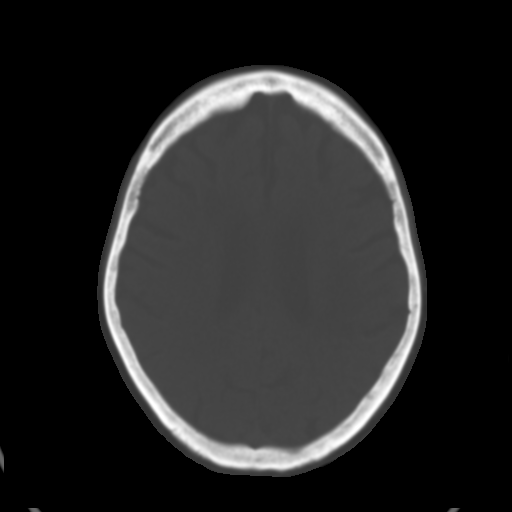
[im 23/31  brain]
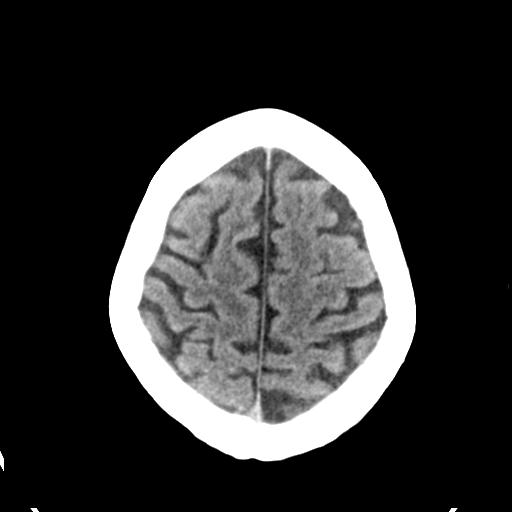
[im 27/31  brain]
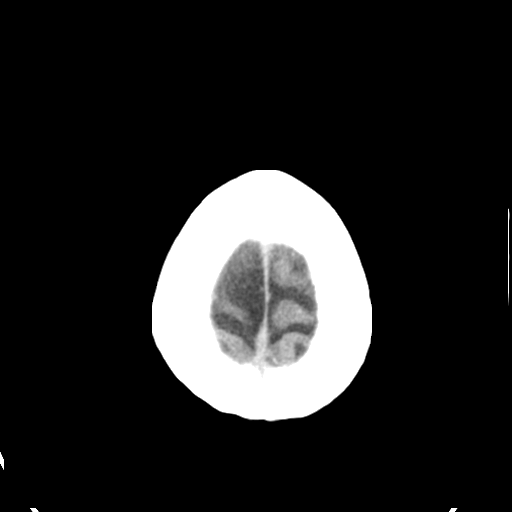

[Series 4: head bone · axial · 0.43mm/px · z∈[-145,-113]mm · 3 of 78 slices shown]
[im 8/78  bone]
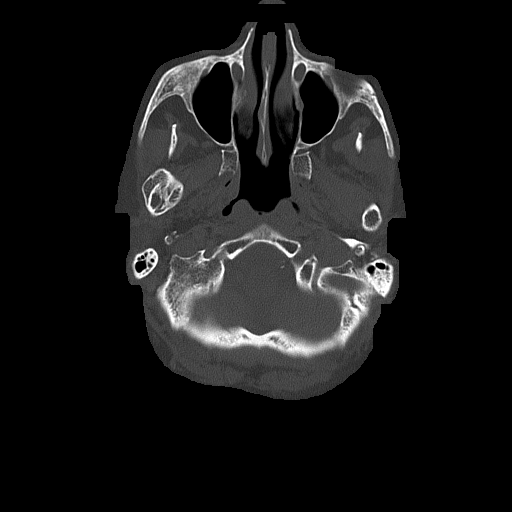
[im 16/78  bone]
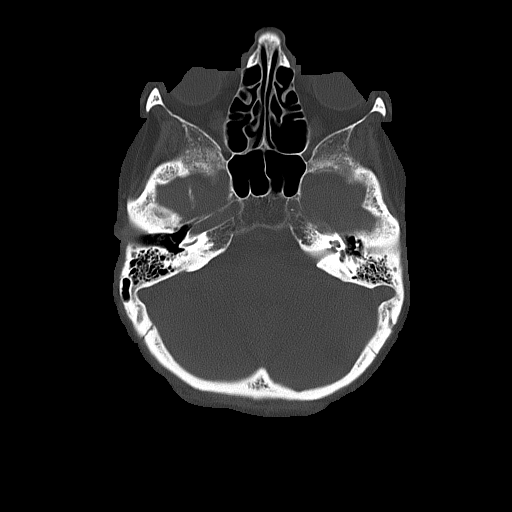
[im 24/78  bone]
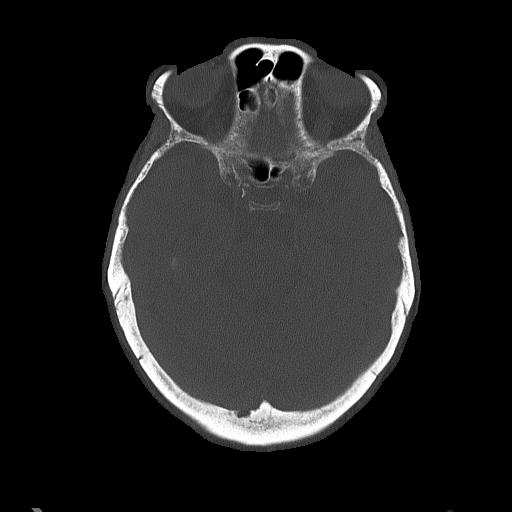

[Series 5: head without cor · coronal · non-contrast · 0.29mm/px · 3 of 66 slices shown]
[im 22/66  brain]
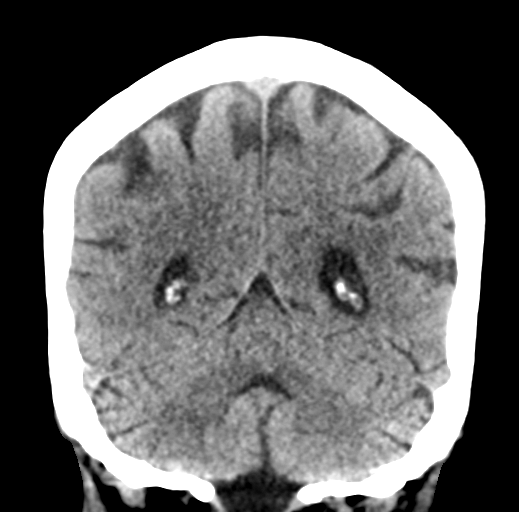
[im 29/66  brain]
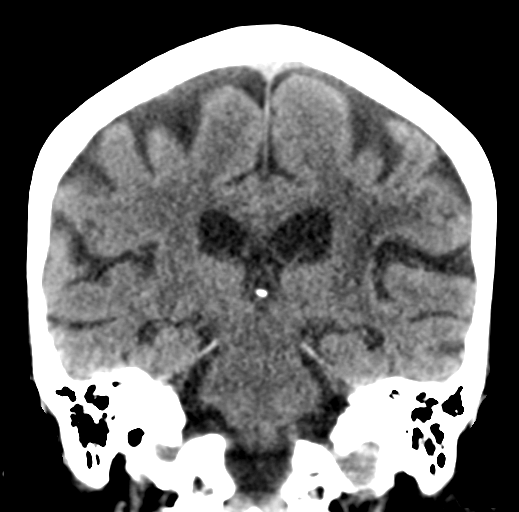
[im 37/66  brain]
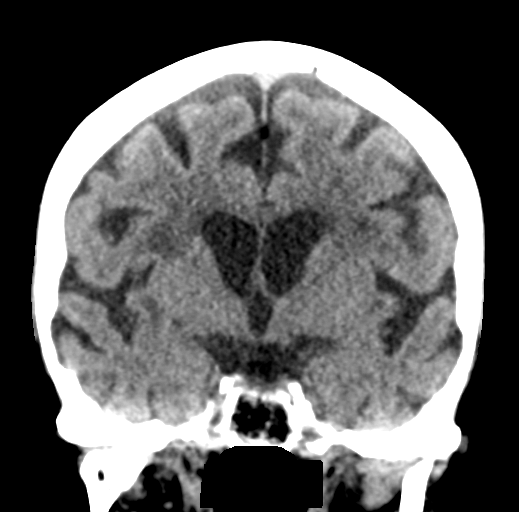

[Series 6: head without sag · sagittal · non-contrast · 0.30mm/px · 3 of 51 slices shown]
[im 17/51  brain]
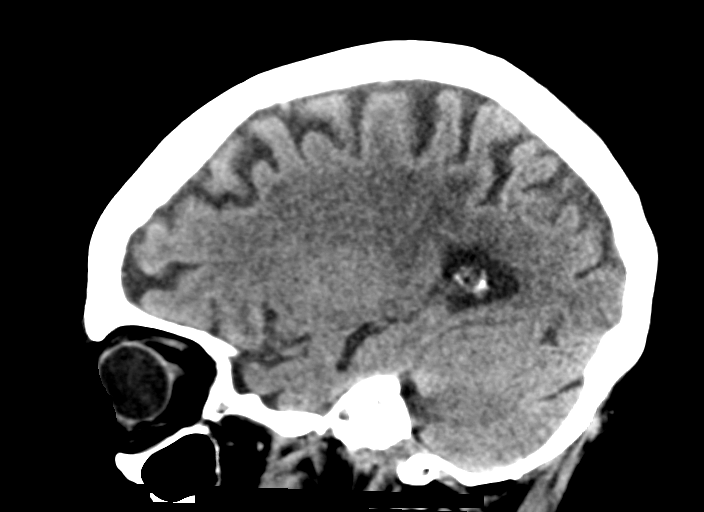
[im 26/51  brain]
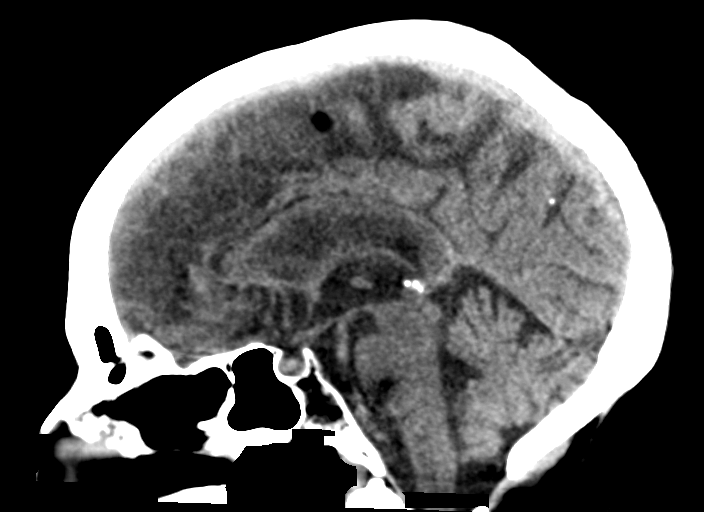
[im 34/51  brain]
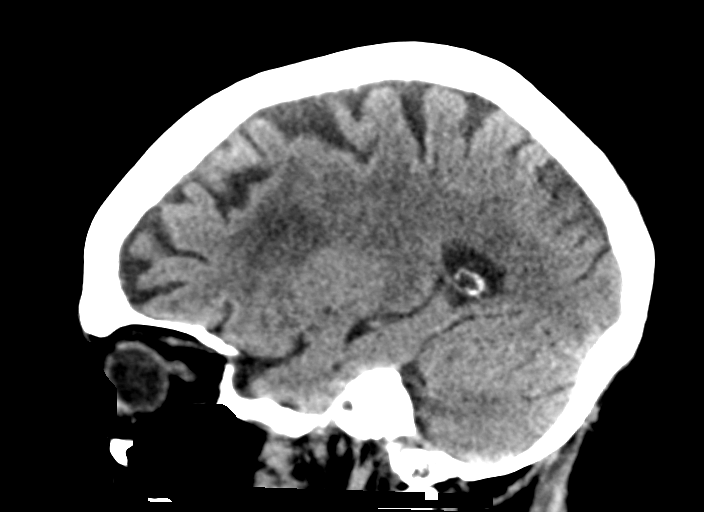

[16 of 47 positions shown; findings below may reference images not displayed]

FINDINGS: CT HEAD FINDINGS

Brain: No acute territorial infarction, hemorrhage, or intracranial
mass. Chronic left greater than right cerebellar infarcts. Small
chronic infarcts within the right basal ganglial a and sub insula.
Atrophy and chronic small vessel ischemic changes of the white
matter. Stable ventricle size

Vascular: No hyperdense vessels.  Carotid vascular calcification

Skull: Normal. Negative for fracture or focal lesion.

Sinuses/Orbits: No acute finding.

Other: None

CT CERVICAL SPINE FINDINGS

Alignment: Normal.

Skull base and vertebrae: Chronic appearing superior endplate
deformities at T1 and T2. Cervical vertebral bodies show normal
stature and no fracture

Soft tissues and spinal canal: No prevertebral fluid or swelling. No
visible canal hematoma.

Disc levels: Multilevel degenerative changes. Advanced disease at
C4-C5, C5-C6 and C6-C7. Facet degenerative changes at multiple
levels with foraminal narrowing

Upper chest: Stable punctate nodule at the right apex

Other: None
IMPRESSION: 1. No CT evidence for acute intracranial abnormality. Atrophy and
chronic small vessel ischemic change of the white matter.
2. Degenerative changes of the cervical spine. No acute osseous
abnormality
